# Patient Record
Sex: Female | Born: 1935 | Race: Black or African American | Hispanic: No | Marital: Single | Smoking: Never smoker
Health system: Southern US, Community
[De-identification: ages and names within clinical notes are randomized; demographics above are authoritative.]

## PROBLEM LIST (undated history)

## (undated) DIAGNOSIS — K259 Gastric ulcer, unspecified as acute or chronic, without hemorrhage or perforation: Secondary | ICD-10-CM

## (undated) DIAGNOSIS — M4800 Spinal stenosis, site unspecified: Secondary | ICD-10-CM

## (undated) DIAGNOSIS — I82409 Acute embolism and thrombosis of unspecified deep veins of unspecified lower extremity: Secondary | ICD-10-CM

## (undated) DIAGNOSIS — M1990 Unspecified osteoarthritis, unspecified site: Secondary | ICD-10-CM

## (undated) DIAGNOSIS — J302 Other seasonal allergic rhinitis: Secondary | ICD-10-CM

## (undated) DIAGNOSIS — N390 Urinary tract infection, site not specified: Secondary | ICD-10-CM

## (undated) DIAGNOSIS — R000 Tachycardia, unspecified: Secondary | ICD-10-CM

## (undated) DIAGNOSIS — N179 Acute kidney failure, unspecified: Secondary | ICD-10-CM

## (undated) DIAGNOSIS — K5900 Constipation, unspecified: Secondary | ICD-10-CM

## (undated) DIAGNOSIS — S42202A Unspecified fracture of upper end of left humerus, initial encounter for closed fracture: Secondary | ICD-10-CM

## (undated) DIAGNOSIS — E559 Vitamin D deficiency, unspecified: Secondary | ICD-10-CM

## (undated) DIAGNOSIS — I10 Essential (primary) hypertension: Secondary | ICD-10-CM

## (undated) DIAGNOSIS — I2699 Other pulmonary embolism without acute cor pulmonale: Secondary | ICD-10-CM

## (undated) DIAGNOSIS — IMO0002 Reserved for concepts with insufficient information to code with codable children: Secondary | ICD-10-CM

## (undated) HISTORY — DX: Gastric ulcer, unspecified as acute or chronic, without hemorrhage or perforation: K25.9

## (undated) HISTORY — DX: Acute embolism and thrombosis of unspecified deep veins of unspecified lower extremity: I82.409

## (undated) HISTORY — DX: Other pulmonary embolism without acute cor pulmonale: I26.99

## (undated) HISTORY — DX: Spinal stenosis, site unspecified: M48.00

## (undated) HISTORY — PX: DILATION AND CURETTAGE OF UTERUS: SHX78

## (undated) HISTORY — DX: Constipation, unspecified: K59.00

## (undated) HISTORY — DX: Other seasonal allergic rhinitis: J30.2

## (undated) HISTORY — PX: TONSILLECTOMY AND ADENOIDECTOMY: SUR1326

## (undated) HISTORY — DX: Reserved for concepts with insufficient information to code with codable children: IMO0002

## (undated) HISTORY — DX: Acute kidney failure, unspecified: N17.9

---

## 1998-09-03 ENCOUNTER — Encounter: Payer: Self-pay | Admitting: Emergency Medicine

## 1998-09-03 ENCOUNTER — Encounter: Payer: Self-pay | Admitting: Orthopaedic Surgery

## 1998-09-03 ENCOUNTER — Emergency Department (HOSPITAL_COMMUNITY): Admission: EM | Admit: 1998-09-03 | Discharge: 1998-09-03 | Payer: Self-pay | Admitting: Emergency Medicine

## 1998-12-02 ENCOUNTER — Encounter: Admission: RE | Admit: 1998-12-02 | Discharge: 1998-12-28 | Payer: Self-pay | Admitting: Orthopaedic Surgery

## 1999-01-01 ENCOUNTER — Encounter: Admission: RE | Admit: 1999-01-01 | Discharge: 1999-01-01 | Payer: Self-pay | Admitting: Orthopaedic Surgery

## 1999-01-21 ENCOUNTER — Encounter: Admission: RE | Admit: 1999-01-21 | Discharge: 1999-02-09 | Payer: Self-pay | Admitting: Orthopaedic Surgery

## 1999-12-08 ENCOUNTER — Encounter: Admission: RE | Admit: 1999-12-08 | Discharge: 1999-12-08 | Payer: Self-pay | Admitting: Hematology and Oncology

## 1999-12-10 ENCOUNTER — Encounter: Admission: RE | Admit: 1999-12-10 | Discharge: 1999-12-10 | Payer: Self-pay | Admitting: Internal Medicine

## 1999-12-13 ENCOUNTER — Encounter: Admission: RE | Admit: 1999-12-13 | Discharge: 1999-12-13 | Payer: Self-pay | Admitting: Internal Medicine

## 1999-12-15 ENCOUNTER — Encounter: Admission: RE | Admit: 1999-12-15 | Discharge: 1999-12-15 | Payer: Self-pay | Admitting: Internal Medicine

## 1999-12-29 ENCOUNTER — Encounter: Admission: RE | Admit: 1999-12-29 | Discharge: 1999-12-29 | Payer: Self-pay | Admitting: Internal Medicine

## 2000-09-12 ENCOUNTER — Encounter: Payer: Self-pay | Admitting: Family Medicine

## 2000-09-12 ENCOUNTER — Ambulatory Visit (HOSPITAL_COMMUNITY): Admission: RE | Admit: 2000-09-12 | Discharge: 2000-09-12 | Payer: Self-pay | Admitting: Family Medicine

## 2001-04-16 ENCOUNTER — Other Ambulatory Visit: Admission: RE | Admit: 2001-04-16 | Discharge: 2001-04-16 | Payer: Self-pay | Admitting: Family Medicine

## 2011-12-06 ENCOUNTER — Emergency Department (HOSPITAL_COMMUNITY): Payer: Medicare Other

## 2011-12-06 ENCOUNTER — Encounter (HOSPITAL_COMMUNITY): Payer: Self-pay | Admitting: *Deleted

## 2011-12-06 ENCOUNTER — Observation Stay (HOSPITAL_COMMUNITY)
Admission: EM | Admit: 2011-12-06 | Discharge: 2011-12-09 | Disposition: A | Discharge status: Home / Self Care | Payer: Medicare Other | Attending: Internal Medicine | Admitting: Internal Medicine

## 2011-12-06 DIAGNOSIS — R5383 Other fatigue: Secondary | ICD-10-CM | POA: Insufficient documentation

## 2011-12-06 DIAGNOSIS — N390 Urinary tract infection, site not specified: Secondary | ICD-10-CM | POA: Insufficient documentation

## 2011-12-06 DIAGNOSIS — M549 Dorsalgia, unspecified: Secondary | ICD-10-CM

## 2011-12-06 DIAGNOSIS — R079 Chest pain, unspecified: Principal | ICD-10-CM | POA: Diagnosis present

## 2011-12-06 DIAGNOSIS — M545 Low back pain, unspecified: Secondary | ICD-10-CM | POA: Insufficient documentation

## 2011-12-06 DIAGNOSIS — Z79899 Other long term (current) drug therapy: Secondary | ICD-10-CM | POA: Insufficient documentation

## 2011-12-06 DIAGNOSIS — R5381 Other malaise: Secondary | ICD-10-CM | POA: Insufficient documentation

## 2011-12-06 DIAGNOSIS — R0602 Shortness of breath: Secondary | ICD-10-CM | POA: Insufficient documentation

## 2011-12-06 DIAGNOSIS — R000 Tachycardia, unspecified: Secondary | ICD-10-CM | POA: Insufficient documentation

## 2011-12-06 DIAGNOSIS — I10 Essential (primary) hypertension: Secondary | ICD-10-CM | POA: Diagnosis present

## 2011-12-06 DIAGNOSIS — M129 Arthropathy, unspecified: Secondary | ICD-10-CM | POA: Insufficient documentation

## 2011-12-06 DIAGNOSIS — R531 Weakness: Secondary | ICD-10-CM

## 2011-12-06 DIAGNOSIS — I77819 Aortic ectasia, unspecified site: Secondary | ICD-10-CM | POA: Insufficient documentation

## 2011-12-06 HISTORY — DX: Unspecified osteoarthritis, unspecified site: M19.90

## 2011-12-06 HISTORY — DX: Essential (primary) hypertension: I10

## 2011-12-06 LAB — URINALYSIS, ROUTINE W REFLEX MICROSCOPIC
Bilirubin Urine: NEGATIVE
Ketones, ur: NEGATIVE mg/dL
Nitrite: NEGATIVE
Specific Gravity, Urine: 1.015 (ref 1.005–1.030)
Urobilinogen, UA: 0.2 mg/dL (ref 0.0–1.0)

## 2011-12-06 LAB — CBC WITH DIFFERENTIAL/PLATELET
Basophils Absolute: 0 10*3/uL (ref 0.0–0.1)
Basophils Relative: 0 % (ref 0–1)
Eosinophils Relative: 0 % (ref 0–5)
HCT: 42 % (ref 36.0–46.0)
MCHC: 34.3 g/dL (ref 30.0–36.0)
Monocytes Absolute: 0.3 10*3/uL (ref 0.1–1.0)
Neutro Abs: 8.4 10*3/uL — ABNORMAL HIGH (ref 1.7–7.7)
RDW: 14.1 % (ref 11.5–15.5)

## 2011-12-06 LAB — POCT I-STAT, CHEM 8
Creatinine, Ser: 1.1 mg/dL (ref 0.50–1.10)
Glucose, Bld: 114 mg/dL — ABNORMAL HIGH (ref 70–99)
Hemoglobin: 15.3 g/dL — ABNORMAL HIGH (ref 12.0–15.0)
TCO2: 25 mmol/L (ref 0–100)

## 2011-12-06 LAB — LIPID PANEL
LDL Cholesterol: 73 mg/dL (ref 0–99)
Total CHOL/HDL Ratio: 1.9 RATIO
VLDL: 7 mg/dL (ref 0–40)

## 2011-12-06 LAB — CARDIAC PANEL(CRET KIN+CKTOT+MB+TROPI)
CK, MB: 3.6 ng/mL (ref 0.3–4.0)
Relative Index: 0.9 (ref 0.0–2.5)

## 2011-12-06 LAB — MAGNESIUM: Magnesium: 2.1 mg/dL (ref 1.5–2.5)

## 2011-12-06 MED ORDER — HYDROCHLOROTHIAZIDE 25 MG PO TABS
25.0000 mg | ORAL_TABLET | Freq: Every day | ORAL | Status: DC
  Administered 2011-12-07 – 2011-12-09 (×3): 25 mg via ORAL
  Filled 2011-12-06 (×3): qty 1

## 2011-12-06 MED ORDER — FENTANYL CITRATE 0.05 MG/ML IJ SOLN
50.0000 ug | Freq: Once | INTRAMUSCULAR | Status: AC
  Administered 2011-12-06: 50 ug via INTRAVENOUS
  Filled 2011-12-06: qty 2

## 2011-12-06 MED ORDER — SODIUM CHLORIDE 0.9 % IJ SOLN
3.0000 mL | Freq: Two times a day (BID) | INTRAMUSCULAR | Status: DC
  Administered 2011-12-09: 3 mL via INTRAVENOUS

## 2011-12-06 MED ORDER — ONDANSETRON HCL 4 MG/2ML IJ SOLN: 4.0000 mg | Freq: Four times a day (QID) | INTRAMUSCULAR | Status: DC | PRN

## 2011-12-06 MED ORDER — AMLODIPINE BESYLATE 10 MG PO TABS
10.0000 mg | ORAL_TABLET | Freq: Every day | ORAL | Status: DC
  Administered 2011-12-07 – 2011-12-09 (×3): 10 mg via ORAL
  Filled 2011-12-06 (×3): qty 1

## 2011-12-06 MED ORDER — SODIUM CHLORIDE 0.9 % IV BOLUS (SEPSIS)
500.0000 mL | Freq: Once | INTRAVENOUS | Status: AC
  Administered 2011-12-06: 1000 mL via INTRAVENOUS

## 2011-12-06 MED ORDER — ONDANSETRON HCL 4 MG PO TABS: 4.0000 mg | ORAL_TABLET | Freq: Four times a day (QID) | ORAL | Status: DC | PRN

## 2011-12-06 MED ORDER — ENOXAPARIN SODIUM 30 MG/0.3ML ~~LOC~~ SOLN
30.0000 mg | SUBCUTANEOUS | Status: DC
  Administered 2011-12-06 – 2011-12-08 (×3): 30 mg via SUBCUTANEOUS
  Filled 2011-12-06 (×4): qty 0.3

## 2011-12-06 MED ORDER — SODIUM CHLORIDE 0.9 % IV SOLN: INTRAVENOUS | Status: AC

## 2011-12-06 MED ORDER — MORPHINE SULFATE 2 MG/ML IJ SOLN: 1.0000 mg | INTRAMUSCULAR | Status: DC | PRN

## 2011-12-06 MED ORDER — DICLOFENAC SODIUM 50 MG PO TBEC
50.0000 mg | DELAYED_RELEASE_TABLET | Freq: Two times a day (BID) | ORAL | Status: DC
  Administered 2011-12-06 – 2011-12-09 (×6): 50 mg via ORAL
  Filled 2011-12-06 (×7): qty 1

## 2011-12-06 MED ORDER — HYDROCODONE-ACETAMINOPHEN 5-325 MG PO TABS
1.0000 | ORAL_TABLET | ORAL | Status: DC | PRN
Start: 2011-12-06 — End: 2011-12-09
  Administered 2011-12-06: 1 via ORAL
  Administered 2011-12-07: 2 via ORAL
  Filled 2011-12-06 (×2): qty 2

## 2011-12-06 MED ORDER — LORATADINE 10 MG PO TABS
10.0000 mg | ORAL_TABLET | Freq: Every day | ORAL | Status: DC
  Administered 2011-12-07 – 2011-12-09 (×3): 10 mg via ORAL
  Filled 2011-12-06 (×3): qty 1

## 2011-12-06 MED ORDER — ASPIRIN EC 81 MG PO TBEC
81.0000 mg | DELAYED_RELEASE_TABLET | Freq: Every day | ORAL | Status: DC
  Administered 2011-12-06 – 2011-12-09 (×4): 81 mg via ORAL
  Filled 2011-12-06 (×6): qty 1

## 2011-12-06 NOTE — ED Provider Notes (Signed)
History     CSN: [id number]  Arrival date & time 12/06/11  1348   First MD Initiated Contact with Patient 12/06/11 1504      Chief Complaint  Patient presents with  . Back Pain  . Weakness    (Consider location/radiation/quality/duration/timing/severity/associated sxs/prior treatment) Patient is a 78 y.o. female presenting with back pain and weakness. The history is provided by the patient.  Back Pain  Associated symptoms include weakness. Pertinent negatives include no chest pain, no numbness, no headaches and no abdominal pain.  Weakness Primary symptoms do not include headaches, nausea or vomiting.  Additional symptoms include weakness. Additional symptoms do not include neck stiffness.   EMS was reportedly called because patient was not able to get out of the bathtub. Patient states she just takes longer now. She states her blood pressures been a little low and a new blood pressure medicine was started. She states it has been making her urinate more frequently. She denies chest pain abdominal pain back pain headache numbness or weakness. Patient states she feels fine.  Past Medical History  Diagnosis Date  . Hypertension   . Arthritis     Past Surgical History  Procedure Date  . No past surgeries     History reviewed. No pertinent family history.  History  Substance Use Topics  . Smoking status: Never Smoker   . Smokeless tobacco: Never Used  . Alcohol Use: No    OB History    Grav Para Term Preterm Abortions TAB SAB Ect Mult Living                  Review of Systems  Constitutional: Positive for fatigue. Negative for activity change and appetite change.  HENT: Negative for neck stiffness.   Eyes: Negative for pain.  Respiratory: Negative for chest tightness and shortness of breath.   Cardiovascular: Negative for chest pain and leg swelling.  Gastrointestinal: Negative for nausea, vomiting, abdominal pain and diarrhea.  Genitourinary: Positive for  frequency. Negative for flank pain.  Musculoskeletal: Positive for back pain.  Skin: Negative for rash.  Neurological: Positive for weakness. Negative for numbness and headaches.  Psychiatric/Behavioral: Negative for behavioral problems.    Allergies  Penicillins  Home Medications   No current outpatient prescriptions on file.  BP 145/86  Pulse 86  Temp 98.2 F (36.8 C) (Oral)  Resp 24  SpO2 97%  Physical Exam  Constitutional: She is oriented to person, place, and time. She appears well-developed.  HENT:  Head: Normocephalic.  Eyes: Pupils are equal, round, and reactive to light.  Neck: Normal range of motion. Neck supple.  Cardiovascular: Regular rhythm.        Mild tachycardia  Pulmonary/Chest: Effort normal and breath sounds normal.  Abdominal: Soft. There is no tenderness.  Musculoskeletal: Normal range of motion.  Neurological: She is alert and oriented to person, place, and time.  Skin: Skin is warm.    ED Course  Procedures (including critical care time)  Labs Reviewed  CBC WITH DIFFERENTIAL - Abnormal; Notable for the following:    Neutrophils Relative 92 (*)     Neutro Abs 8.4 (*)     Lymphocytes Relative 5 (*)     Lymphs Abs 0.5 (*)     All other components within normal limits  URINALYSIS, ROUTINE W REFLEX MICROSCOPIC - Abnormal; Notable for the following:    APPearance TURBID (*)     Hgb urine dipstick TRACE (*)     Leukocytes, UA LARGE (*)  All other components within normal limits  POCT I-STAT, CHEM 8 - Abnormal; Notable for the following:    Glucose, Bld 114 (*)     Hemoglobin 15.3 (*)     All other components within normal limits  CARDIAC PANEL(CRET KIN+CKTOT+MB+TROPI) - Abnormal; Notable for the following:    Total CK 415 (*)     All other components within normal limits  TROPONIN I  URINE MICROSCOPIC-ADD ON  MAGNESIUM  PHOSPHORUS  PRO B NATRIURETIC PEPTIDE  LIPID PANEL  URINE CULTURE  TSH  CARDIAC PANEL(CRET KIN+CKTOT+MB+TROPI)    HEMOGLOBIN A1C  BASIC METABOLIC PANEL  CBC   Dg Chest 2 View  12/06/2011  *RADIOLOGY REPORT*  Clinical Data: Weakness and fever  CHEST - 2 VIEW  Comparison: None.  Findings: Cardiac silhouette is mildly enlarged.  The aorta is ectatic.  Lungs are hyperinflated.  No effusion, infiltrate, or pneumothorax.  IMPRESSION:  1.  Emphysematous change. 2.  No acute cardiopulmonary findings.  Original Report Authenticated By: Genevive Bi, M.D.   Dg Lumbar Spine Complete  12/06/2011  *RADIOLOGY REPORT*  Clinical Data: Low back pain.  No known injury.  LUMBAR SPINE - COMPLETE 4+ VIEW  Comparison: None.  Findings: Advanced disc space narrowing L5-S1. Slight endplate concavity superior L4 without frank compression fracture.  Mild to moderate disc space narrowing L1-2 and L2-3.  Anatomic alignment. Advanced lower lumbar facet arthropathy.  Moderate stool burden. No osseous destructive lesion.  IMPRESSION: Degenerative changes as described.  No acute compression fracture or subluxation.  Original Report Authenticated By: Elsie Stain, M.D.     1. Back pain   2. Weakness   3. Tachycardia      Date: 12/06/2011  Rate: 93  Rhythm: normal sinus rhythm  QRS Axis: normal  Intervals: normal  ST/T Wave abnormalities: nonspecific ST/T changes  Conduction Disutrbances:none  Narrative Interpretation:   Old EKG Reviewed: none available    MDM  Patient with generalized weakness and back pain. Unable annually due to the pain. Negative x-ray. Also set some generalized weakness. Laboratories overall reassuring. She has some nonspecific EKG changes and left chest pain. She'll be admitted to medicine for further evaluation.        Juliet Rude. Rubin Payor, MD 12/06/11 [id number]

## 2011-12-06 NOTE — ED Notes (Signed)
Attempted to walk the patient. The patient reports that she is having too much pain to walk at this time. Family at the bedside

## 2011-12-06 NOTE — ED Notes (Signed)
Per ems: pt's son called because pt was not able to get out of the bath tub.pt c/o back pain and weakness for a couple of days. No loc noted. Pt is alert and oriented

## 2011-12-06 NOTE — H&P (Addendum)
[facility] History and Physical  TALANA SLATTEN ZOX:[id number] DOB: [birth year] DOA: 12/06/2011  Referring physician: ED physician PCP: No primary provider on file.   Chief Complaint: Chest paic  HPI:  78 yo female with HTN presents to [facility] ED with main concern of sudden onset, substernal, intermittent chest pain that started the morning of admission. Patient describes it as sharp, 5/10 in severity when presents, with no specific associated symptoms, with no specific aggravating or alleviating factors, no abdominal or urinary concerns. Pt denies fevers, chills, shortness of breath,no similar episodes in the past, no headaches or visual changes, no recent sicknesses or hospitalizations.   Review of Systems:   Constitutional: Negative for fever, chills and malaise/fatigue. Negative for diaphoresis.  HENT: Negative for hearing loss, ear pain, nosebleeds, congestion, sore throat, neck pain, tinnitus and ear discharge.   Eyes: Negative for blurred vision, double vision, photophobia, pain, discharge and redness.  Respiratory: Negative for cough, hemoptysis, sputum production, shortness of breath, wheezing and stridor.   Cardiovascular: Positive for chest pain, negative for palpitations, orthopnea, claudication and leg swelling.  Gastrointestinal: Negative for nausea, vomiting and abdominal pain. Negative for heartburn, constipation, blood in stool and melena.  Genitourinary: Negative for dysuria, urgency, frequency, hematuria and flank pain.  Musculoskeletal: Negative for myalgias, back pain, joint pain and falls.  Skin: Negative for itching and rash.  Neurological: Negative for dizziness and weakness. Negative for tingling, tremors, sensory change, speech change, focal weakness, loss of consciousness and headaches.  Endo/Heme/Allergies: Negative for environmental allergies and polydipsia. Does not bruise/bleed easily.  Psychiatric/Behavioral: Negative for suicidal ideas. The patient is not  nervous/anxious.      Past Medical History  Diagnosis Date  . Hypertension    History reviewed. No pertinent past surgical history. Social History:  reports that she has never smoked. She does not have any smokeless tobacco history on file. She reports that she does not drink alcohol or use illicit drugs.  Allergies  Allergen Reactions  . Penicillins Rash    No family history on file.  Prior to Admission medications   Medication Sig Start Date End Date Taking? Authorizing Provider  amLODipine (NORVASC) 10 MG tablet Take 10 mg by mouth daily.   Yes Historical Provider, MD  diclofenac (VOLTAREN) 50 MG EC tablet Take 50 mg by mouth 2 (two) times daily.   Yes Historical Provider, MD  hydrochlorothiazide (HYDRODIURIL) 25 MG tablet Take 25 mg by mouth daily.   Yes Historical Provider, MD  loratadine (CLARITIN) 10 MG tablet Take 10 mg by mouth daily.   Yes Historical Provider, MD   Physical Exam: Filed Vitals:   12/06/11 1353 12/06/11 1522 12/06/11 1934  BP: 136/88 137/91   Pulse: 114 97 100  Temp: 98.2 F (36.8 C)    TempSrc: Oral    Resp: 16  24  SpO2: 96% 98% 97%    Physical Exam  Constitutional: Appears well-developed and well-nourished. No distress.  HENT: Normocephalic. External right and left ear normal. Oropharynx is clear and moist.  Eyes: Conjunctivae and EOM are normal. PERRLA, no scleral icterus.  Neck: Normal ROM. Neck supple. No JVD. No tracheal deviation. No thyromegaly.  CVS: RRR, S1/S2 +, no murmurs, no gallops, no carotid bruit.  Pulmonary: Effort and breath sounds normal, no stridor, rhonchi, wheezes, rales.  Abdominal: Soft. BS +,  no distension, tenderness, rebound or guarding.  Musculoskeletal: Normal range of motion. No edema and no tenderness.  Lymphadenopathy: No lymphadenopathy noted, cervical, inguinal. Neuro: Alert. Normal reflexes, muscle  tone coordination. No cranial nerve deficit. Skin: Skin is warm and dry. No rash noted. Not diaphoretic. No  erythema. No pallor.  Psychiatric: Normal mood and affect. Behavior, judgment, thought content normal.   Labs on Admission:  Basic Metabolic Panel:  Lab 12/06/11 0102  NA 141  K 4.1  CL 103  CO2 --  GLUCOSE 114*  BUN 23  CREATININE 1.10  CALCIUM --  MG --  PHOS --   CBC:  Lab 12/06/11 1538 12/06/11 1517  WBC -- 9.2  NEUTROABS -- 8.4*  HGB 15.3* 14.4  HCT 45.0 42.0  MCV -- 85.4  PLT -- 238   Cardiac Enzymes:  Lab 12/06/11 1517  CKTOTAL --  CKMB --  CKMBINDEX --  TROPONINI <0.30    Radiological Exams on Admission:  Dg Chest 2 View 12/06/2011  IMPRESSION:   1.  Emphysematous change.  2.  No acute cardiopulmonary findings.   Dg Lumbar Spine Complete 12/06/2011   IMPRESSION:  Degenerative changes as described.  No acute compression fracture or subluxation.    EKG: pending  Assessment/Plan  Chest pain with intermittent shortness of breath - will admit the pt to telemetry floor for further evaluation and management - will cycle CE's and will obtain 12 lead EKG, check BNP - we have no previous EKG to compare with  - will check TSH and electrolyte panel, lipid panel - BP control with continuation of home medication regimen - CBC and BMP in AM - start aspirin 81 mg PO QD, analgesia for adequate pain control   HTN - continue home medication regimen - monitor vitals per floor protocol  Code Status: Full Family Communication: Pt and family at bedside Disposition Plan: Admit to telemetry floor  Debbora Presto, MD  [facility] Pager [phone]  If 7PM-7AM, please contact night-coverage [email or web address] Password [email or web address] 12/06/2011, 9:10 PM

## 2011-12-06 NOTE — ED Notes (Signed)
WUJ:[id number]<JY> Expected date:12/06/11<BR> Expected time: 1:32 PM<BR> Means of arrival:Ambulance<BR> Comments:<BR> 82yoF,&quot;inability to get out of the tub&quot;.

## 2011-12-07 DIAGNOSIS — R5381 Other malaise: Secondary | ICD-10-CM

## 2011-12-07 DIAGNOSIS — N390 Urinary tract infection, site not specified: Secondary | ICD-10-CM | POA: Diagnosis present

## 2011-12-07 DIAGNOSIS — I10 Essential (primary) hypertension: Secondary | ICD-10-CM

## 2011-12-07 DIAGNOSIS — R079 Chest pain, unspecified: Secondary | ICD-10-CM

## 2011-12-07 LAB — CBC
HCT: 39.6 % (ref 36.0–46.0)
Hemoglobin: 13.5 g/dL (ref 12.0–15.0)
MCH: 29.6 pg (ref 26.0–34.0)
MCHC: 34.1 g/dL (ref 30.0–36.0)
MCV: 86.8 fL (ref 78.0–100.0)
RBC: 4.56 MIL/uL (ref 3.87–5.11)

## 2011-12-07 LAB — BASIC METABOLIC PANEL
BUN: 19 mg/dL (ref 6–23)
CO2: 24 mEq/L (ref 19–32)
Chloride: 106 mEq/L (ref 96–112)
GFR calc non Af Amer: 56 mL/min — ABNORMAL LOW (ref >90)
Glucose, Bld: 86 mg/dL (ref 70–99)
Potassium: 3.8 mEq/L (ref 3.5–5.1)
Sodium: 141 mEq/L (ref 135–145)

## 2011-12-07 LAB — CARDIAC PANEL(CRET KIN+CKTOT+MB+TROPI)
CK, MB: 3.3 ng/mL (ref 0.3–4.0)
CK, MB: 3.3 ng/mL (ref 0.3–4.0)
Total CK: 413 U/L — ABNORMAL HIGH (ref 7–177)
Total CK: 489 U/L — ABNORMAL HIGH (ref 7–177)
Troponin I: <0.3 ng/mL (ref <0.30)

## 2011-12-07 LAB — URINE CULTURE: Colony Count: NO GROWTH

## 2011-12-07 LAB — HEMOGLOBIN A1C: Hgb A1c MFr Bld: 6 % — ABNORMAL HIGH (ref <5.7)

## 2011-12-07 LAB — D-DIMER, QUANTITATIVE: D-Dimer, Quant: 2.39 ug/mL-FEU — ABNORMAL HIGH (ref 0.00–0.48)

## 2011-12-07 MED ORDER — CIPROFLOXACIN IN D5W 400 MG/200ML IV SOLN
400.0000 mg | Freq: Two times a day (BID) | INTRAVENOUS | Status: DC
  Administered 2011-12-07 – 2011-12-08 (×2): 400 mg via INTRAVENOUS
  Filled 2011-12-07 (×3): qty 200

## 2011-12-07 NOTE — Progress Notes (Signed)
   CARE MANAGEMENT NOTE 12/07/2011  Patient:  Becky Leach, Becky Leach   Account Number:  [id number]  Date Initiated:  12/07/2011  Documentation initiated by:  Jiles Crocker  Subjective/Objective Assessment:   ADMITTED WITH BACK AND CHEST PAIN     Action/Plan:   LIVES AT HOME WITH FAMILY; CARDIAC WORKUP IN PROGRESS   Anticipated DC Date:  12/09/2011   Anticipated DC Plan:  HOME W HOME HEALTH SERVICES      DC Planning Services  CM consult            Status of service:  In process, will continue to follow Medicare Important Message given?  NA - LOS <3 / Initial given by admissions (If response is "NO", the following Medicare IM given date fields will be blank)  Per UR Regulation:  Reviewed for med. necessity/level of care/duration of stay  Comments:  12/07/2011- B Fishel Wamble RN, BSN, MHA

## 2011-12-07 NOTE — Evaluation (Signed)
Physical Therapy Evaluation Patient Details Name: Becky Leach MRN: [id number] DOB: July 22, 1929 Today's Date: 12/07/2011 Time: 4098-1191 PT Time Calculation (min): 29 min  PT Assessment / Plan / Recommendation Clinical Impression  pt will benefit from PT to maximize independence for home; pt has son's assist 24/7; will likely benefit from HHPT and may have better gait stability with Rw---pt did not seem open to walker today as she walks wtih [facility] at baseline    PT Assessment  Patient needs continued PT services    Follow Up Recommendations  Home health PT    Barriers to Discharge        Equipment Recommendations  Rolling walker with 5" wheels (may need)    Recommendations for Other Services     Frequency Min 3X/week    Precautions / Restrictions Precautions Precautions: Fall   Pertinent Vitals/Pain       Mobility  Transfers Transfers: Sit to Stand;Stand to Sit Sit to Stand: 4: Min assist;From chair/3-in-1 Stand to Sit: 4: Min assist;To chair/3-in-1 Details for Transfer Assistance: cues for hand placement and assist for balance upon inital stand Ambulation/Gait Ambulation/Gait Assistance: 4: Min assist Ambulation Distance (Feet): 150 Feet Assistive device: 1 person hand held assist Ambulation/Gait Assistance Details: min assist for balance, cues for step length;   Gait Pattern: Step-through pattern Gait velocity: slow    Exercises     PT Diagnosis: Difficulty walking  PT Problem List: Decreased activity tolerance;Decreased balance;Decreased mobility;Decreased knowledge of use of DME PT Treatment Interventions: DME instruction;Gait training;Stair training;Functional mobility training;Therapeutic activities;Therapeutic exercise;Balance training;Patient/family education   PT Goals Acute Rehab PT Goals PT Goal Formulation: With patient Time For Goal Achievement: 12/21/11 Potential to Achieve Goals: Good Pt will go Supine/Side to Sit: with supervision PT Goal:  Supine/Side to Sit - Progress: Goal set today Pt will go Sit to Stand: with supervision PT Goal: Sit to Stand - Progress: Goal set today Pt will Ambulate: >150 feet;with supervision;with least restrictive assistive device PT Goal: Ambulate - Progress: Goal set today Pt will Go Up / Down Stairs: 3-5 stairs;with min assist;with rail(s) PT Goal: Up/Down Stairs - Progress: Goal set today  Visit Information  Last PT Received On: 12/07/11    Subjective Data  Subjective: my son is going to bring my tennis shoes Patient Stated Goal: home   Prior Functioning  Home Living Lives With: Son Available Help at Discharge: Family Type of Home: House Home Access: Stairs to enter [employer] of Steps: 6 "coming from street" and then 3 steps with 2 rails Entrance Stairs-Rails: Right;Left;Can reach both Home Layout: One level Home Adaptive Equipment: Straight cane;Bedside commode/3-in-1 Additional Comments: laundry in basement; son can assist; doesn't use 3in1 Prior Function Level of Independence: Independent with assistive device(s) Able to Take Stairs?: Yes Communication Communication: No difficulties    Cognition  Overall Cognitive Status: Appears within functional limits for tasks assessed/performed Arousal/Alertness: Awake/alert Orientation Level: Appears intact for tasks assessed Behavior During Session: [facility] for tasks performed    Extremity/Trunk Assessment Right Upper Extremity Assessment RUE ROM/Strength/Tone: [facility] for tasks assessed Left Upper Extremity Assessment LUE ROM/Strength/Tone: [facility] for tasks assessed Right Lower Extremity Assessment RLE ROM/Strength/Tone: [facility] for tasks assessed Left Lower Extremity Assessment LLE ROM/Strength/Tone: [facility] for tasks assessed   Balance Static Standing Balance Static Standing - Balance Support: Right upper extremity supported Static Standing - Level of Assistance: 4: Min assist;5: Stand by assistance  End of Session PT - End of  Session Equipment Utilized During Treatment: Gait belt Activity Tolerance:  Patient tolerated treatment well Patient left: in chair;with chair alarm set;with call bell/phone within reach  GP     [facility] 12/07/2011, 12:54 PM

## 2011-12-07 NOTE — Progress Notes (Signed)
[facility] PROGRESS NOTE  Becky Leach ZOX:[id number] DOB: May 30, 1930 DOA: 12/06/2011 PCP: No primary provider on file.  Assessment/Plan: Principal Problem:  *Chest pain Active Problems:  HTN (hypertension)   Chest pain -Admitted to rule out acute coronary syndrome, so far cardiac enzymes are negative. -Denies any chest pain now, denies any history of heartburn. -I will rule out PE by checking D. dimers if it's positive will obtain CT angio of the chest.  UTI -Urinalysis is consistent with UTI. -I will start patient on ciprofloxacin, we'll adjust antibiotics according to the urine culture.  Hypertension -Continue home medication regimen. -Monitor vitals per flow protocol.   Code Status: Full Family Communication: Remains inpatient Disposition Plan: Home with home health service when she is improving  Brief narrative: 78 year old African American female, came in to the hospital with sharp substernal intermittent chest pain  Consultants:  None  Procedures:  None  Antibiotics:  None  HPI/Subjective: Denies any chest pain, denies shortness of breath. Son at bedside.  Objective: Filed Vitals:   12/06/11 1934 12/06/11 2213 12/06/11 2315 12/07/11 0456  BP:  130/80 145/86 133/83  Pulse: 100 93 86 81  Temp:   98.2 F (36.8 C) 98.3 F (36.8 C)  TempSrc:   Oral Oral  Resp: 24 24 24 20   Height:   5\' 5"  (1.651 m)   Weight:   43.182 kg (95 lb 3.2 oz)   SpO2: 97% 98% 97% 96%    Intake/Output Summary (Last 24 hours) at 12/07/11 1308 Last data filed at 12/07/11 0711  Gross per 24 hour  Intake    120 ml  Output    850 ml  Net   -730 ml    Exam:  General: Alert and awake, oriented x3, not in any acute distress. HEENT: anicteric sclera, pupils reactive to light and accommodation, EOMI CVS: S1-S2 clear, no murmur rubs or gallops Chest: clear to auscultation bilaterally, no wheezing, rales or rhonchi Abdomen: soft nontender, nondistended, normal bowel  sounds, no organomegaly Extremities: no cyanosis, clubbing or edema noted bilaterally Neuro: Cranial nerves II-XII intact, no focal neurological deficits  Data Reviewed: Basic Metabolic Panel:  Lab 12/07/11 8119 12/06/11 2127 12/06/11 1538  NA 141 -- 141  K 3.8 -- 4.1  CL 106 -- 103  CO2 24 -- --  GLUCOSE 86 -- 114*  BUN 19 -- 23  CREATININE 0.94 -- 1.10  CALCIUM 9.8 -- --  MG -- 2.1 --  PHOS -- 3.0 --   Liver Function Tests: No results found for this basename: AST:5,ALT:5,ALKPHOS:5,BILITOT:5,PROT:5,ALBUMIN:5 in the last 168 hours No results found for this basename: LIPASE:5,AMYLASE:5 in the last 168 hours No results found for this basename: AMMONIA:5 in the last 168 hours CBC:  Lab 12/07/11 0505 12/06/11 1538 12/06/11 1517  WBC 6.0 -- 9.2  NEUTROABS -- -- 8.4*  HGB 13.5 15.3* 14.4  HCT 39.6 45.0 42.0  MCV 86.8 -- 85.4  PLT 193 -- 238   Cardiac Enzymes:  Lab 12/07/11 0505 12/06/11 2127 12/06/11 1517  CKTOTAL 413* 415* --  CKMB 3.3 3.6 --  CKMBINDEX -- -- --  TROPONINI <0.30 <0.30 <0.30   BNP (last 3 results)  Basename 12/06/11 2127  PROBNP 127.4   CBG:  Lab 12/07/11 0732  GLUCAP 88    No results found for this or any previous visit (from the past 240 hour(s)).   Studies: Dg Chest 2 View  12/06/2011  *RADIOLOGY REPORT*  Clinical Data: Weakness and fever  CHEST -  2 VIEW  Comparison: None.  Findings: Cardiac silhouette is mildly enlarged.  The aorta is ectatic.  Lungs are hyperinflated.  No effusion, infiltrate, or pneumothorax.  IMPRESSION:  1.  Emphysematous change. 2.  No acute cardiopulmonary findings.  Original Report Authenticated By: Genevive Bi, M.D.   Dg Lumbar Spine Complete  12/06/2011  *RADIOLOGY REPORT*  Clinical Data: Low back pain.  No known injury.  LUMBAR SPINE - COMPLETE 4+ VIEW  Comparison: None.  Findings: Advanced disc space narrowing L5-S1. Slight endplate concavity superior L4 without frank compression fracture.  Mild to moderate disc  space narrowing L1-2 and L2-3.  Anatomic alignment. Advanced lower lumbar facet arthropathy.  Moderate stool burden. No osseous destructive lesion.  IMPRESSION: Degenerative changes as described.  No acute compression fracture or subluxation.  Original Report Authenticated By: Elsie Stain, M.D.    Scheduled Meds:   . amLODipine  10 mg Oral Daily  . aspirin EC  81 mg Oral Daily  . diclofenac  50 mg Oral BID  . enoxaparin (LOVENOX) injection  30 mg Subcutaneous Q24H  . fentaNYL  50 mcg Intravenous Once  . hydrochlorothiazide  25 mg Oral Daily  . loratadine  10 mg Oral Daily  . sodium chloride  500 mL Intravenous Once  . sodium chloride  3 mL Intravenous Q12H   Continuous Infusions:   . sodium chloride       Walter Min A [facility] Pager [phone]  If 7PM-7AM, please contact night-coverage [email or web address] Password [email or web address] 12/07/2011, 1:08 PM   LOS: 1 day

## 2011-12-07 NOTE — Progress Notes (Addendum)
12-07-11  0045 NSG:  Pt admitted to floor at 2300 and pt reported her pain was a 10/10 in her low back and sometimes buttuck (it has not been in her chest per son and pt only in her back) with any kind of movement.  Pt given po Vicodin and and hour and a half later we got her up to bsc and pt reported she no longer had any  Pain with movement.  Son's reported that pt was taking bystolic 10 mg qd started a week ago with all of the other meds started at that time and we do not have it noted in her PTA meds.

## 2011-12-08 ENCOUNTER — Observation Stay (HOSPITAL_COMMUNITY): Payer: Medicare Other

## 2011-12-08 DIAGNOSIS — R000 Tachycardia, unspecified: Secondary | ICD-10-CM | POA: Diagnosis present

## 2011-12-08 LAB — BASIC METABOLIC PANEL
CO2: 26 mEq/L (ref 19–32)
Calcium: 9.7 mg/dL (ref 8.4–10.5)
Chloride: 105 mEq/L (ref 96–112)
Creatinine, Ser: 0.85 mg/dL (ref 0.50–1.10)
Glucose, Bld: 99 mg/dL (ref 70–99)

## 2011-12-08 MED ORDER — MENTHOL 3 MG MT LOZG
1.0000 | LOZENGE | OROMUCOSAL | Status: DC | PRN
  Administered 2011-12-08: 3 mg via ORAL
  Filled 2011-12-08: qty 9

## 2011-12-08 MED ORDER — BOOST / RESOURCE BREEZE PO LIQD
1.0000 | Freq: Every day | ORAL | Status: DC
  Administered 2011-12-08: 1 via ORAL

## 2011-12-08 MED ORDER — IOHEXOL 300 MG/ML  SOLN
50.0000 mL | Freq: Once | INTRAMUSCULAR | Status: AC | PRN
  Administered 2011-12-08: 50 mL via INTRAVENOUS

## 2011-12-08 MED ORDER — ENSURE COMPLETE PO LIQD
237.0000 mL | Freq: Every day | ORAL | Status: DC
  Administered 2011-12-08: 237 mL via ORAL

## 2011-12-08 MED ORDER — LEVOFLOXACIN 500 MG PO TABS
500.0000 mg | ORAL_TABLET | Freq: Every day | ORAL | Status: DC
  Administered 2011-12-08 – 2011-12-09 (×2): 500 mg via ORAL
  Filled 2011-12-08 (×2): qty 1

## 2011-12-08 NOTE — Progress Notes (Signed)
[facility] PROGRESS NOTE  Becky Leach ZOX:[id number] DOB: 12-31-1929 DOA: 12/06/2011 PCP: No primary provider on file.  Assessment/Plan: Principal Problem:  *Chest pain Active Problems:  HTN (hypertension)  UTI (lower urinary tract infection)  Tachycardia   Chest pain -Admitted to rule out acute coronary syndrome, so far cardiac enzymes are negative. -Denies any chest pain now, denies any history of heartburn. -I will rule out PE by checking D. dimers if it's positive will obtain CT angio of the chest.  UTI -Although his urinalysis consistent with UTI with marked pyuria, urine cultures comes back negative. -Patient is on IV ciprofloxacin, I will switch to oral Levaquin.  Hypertension -Continue home medication regimen. -Monitor vitals per flow protocol.  Tachycardia -Heart rate went up to 150, intermittently with regular sinus rhythm at 90. -12-lead EKG showed normal sinus rhythm.  Code Status: Full Family Communication: Remains inpatient Disposition Plan: Home with home health service likely in the morning.  Brief narrative: 78 year old African American female, came in to the hospital with sharp substernal intermittent chest pain  Consultants:  None  Procedures:  None  Antibiotics:  None  HPI/Subjective: Denies any chest pain, has some irritation in her throat  Objective: Filed Vitals:   12/07/11 2200 12/08/11 0453 12/08/11 0549 12/08/11 0920  BP: 132/86  130/85 116/76  Pulse: 86  86 111  Temp: 98.6 F (37 C)  98.5 F (36.9 C)   TempSrc: Oral  Oral   Resp: 18  18   Height:      Weight:  42.003 kg (92 lb 9.6 oz)    SpO2: 96%  97%     Intake/Output Summary (Last 24 hours) at 12/08/11 1222 Last data filed at 12/08/11 1108  Gross per 24 hour  Intake   1040 ml  Output   2300 ml  Net  -1260 ml    Exam:  General: Alert and awake, oriented x3, not in any acute distress. HEENT: anicteric sclera, pupils reactive to light and accommodation,  EOMI CVS: S1-S2 clear, no murmur rubs or gallops Chest: clear to auscultation bilaterally, no wheezing, rales or rhonchi Abdomen: soft nontender, nondistended, normal bowel sounds, no organomegaly Extremities: no cyanosis, clubbing or edema noted bilaterally Neuro: Cranial nerves II-XII intact, no focal neurological deficits  Data Reviewed: Basic Metabolic Panel:  Lab 12/08/11 8119 12/07/11 0505 12/06/11 2127 12/06/11 1538  NA 138 141 -- 141  K 3.9 3.8 -- 4.1  CL 105 106 -- 103  CO2 26 24 -- --  GLUCOSE 99 86 -- 114*  BUN 16 19 -- 23  CREATININE 0.85 0.94 -- 1.10  CALCIUM 9.7 9.8 -- --  MG -- -- 2.1 --  PHOS -- -- 3.0 --   Liver Function Tests: No results found for this basename: AST:5,ALT:5,ALKPHOS:5,BILITOT:5,PROT:5,ALBUMIN:5 in the last 168 hours No results found for this basename: LIPASE:5,AMYLASE:5 in the last 168 hours No results found for this basename: AMMONIA:5 in the last 168 hours CBC:  Lab 12/07/11 0505 12/06/11 1538 12/06/11 1517  WBC 6.0 -- 9.2  NEUTROABS -- -- 8.4*  HGB 13.5 15.3* 14.4  HCT 39.6 45.0 42.0  MCV 86.8 -- 85.4  PLT 193 -- 238   Cardiac Enzymes:  Lab 12/07/11 1324 12/07/11 0505 12/06/11 2127 12/06/11 1517  CKTOTAL 489* 413* 415* --  CKMB 3.3 3.3 3.6 --  CKMBINDEX -- -- -- --  TROPONINI <0.30 <0.30 <0.30 <0.30   BNP (last 3 results)  Basename 12/06/11 2127  PROBNP 127.4   CBG:  Lab 12/08/11 0742 12/07/11 0732  GLUCAP 92 88    Recent Results (from the past 240 hour(s))  URINE CULTURE     Status: Normal   Collection Time   12/06/11  7:08 PM      Component Value Range Status Comment   Specimen Description URINE, CATHETERIZED   Final    Special Requests NONE   Final    Culture  Setup Time 12/06/2011 23:36   Final    Colony Count NO GROWTH   Final    Culture NO GROWTH   Final    Report Status 12/07/2011 FINAL   Final      Studies: Dg Chest 2 View  12/06/2011  *RADIOLOGY REPORT*  Clinical Data: Weakness and fever  CHEST - 2 VIEW   Comparison: None.  Findings: Cardiac silhouette is mildly enlarged.  The aorta is ectatic.  Lungs are hyperinflated.  No effusion, infiltrate, or pneumothorax.  IMPRESSION:  1.  Emphysematous change. 2.  No acute cardiopulmonary findings.  Original Report Authenticated By: Genevive Bi, M.D.   Dg Lumbar Spine Complete  12/06/2011  *RADIOLOGY REPORT*  Clinical Data: Low back pain.  No known injury.  LUMBAR SPINE - COMPLETE 4+ VIEW  Comparison: None.  Findings: Advanced disc space narrowing L5-S1. Slight endplate concavity superior L4 without frank compression fracture.  Mild to moderate disc space narrowing L1-2 and L2-3.  Anatomic alignment. Advanced lower lumbar facet arthropathy.  Moderate stool burden. No osseous destructive lesion.  IMPRESSION: Degenerative changes as described.  No acute compression fracture or subluxation.  Original Report Authenticated By: Elsie Stain, M.D.   Ct Angio Chest W/cm &/or Wo Cm  12/08/2011  *RADIOLOGY REPORT*  Clinical Data: Elevated D-dimer, substernal chest pain  CT ANGIOGRAPHY CHEST  Technique:  Multidetector CT imaging of the chest using the standard protocol during bolus administration of intravenous contrast. Multiplanar reconstructed images including MIPs were obtained and reviewed to evaluate the vascular anatomy.  Contrast: 50mL OMNIPAQUE IOHEXOL 300 MG/ML  SOLN  Comparison: None  Findings: No filling defects in the pulmonary to suggest acute pulmonary embolism. There is a poor opacification of the lower lobe pulmonary arteries due to timing of the contrast bolus.  There is mild atelectasis at the lung bases.  No consolidation or pneumothorax.  Airways are normal.  Limited view of the upper abdomen is unremarkable.  Limited view of the skeleton is unremarkable.  No acute findings of the aorta great vessels.  No pericardial fluid.  IMPRESSION:  1.  No evidence of acute pulmonary embolism.  Evaluation of the lower lobe segmental arteries is somewhat limited due  to bolus timing. 2. Bibasilar atelectasis.  Original Report Authenticated By: Genevive Bi, M.D.    Scheduled Meds:    . amLODipine  10 mg Oral Daily  . aspirin EC  81 mg Oral Daily  . ciprofloxacin  400 mg Intravenous Q12H  . diclofenac  50 mg Oral BID  . enoxaparin (LOVENOX) injection  30 mg Subcutaneous Q24H  . hydrochlorothiazide  25 mg Oral Daily  . loratadine  10 mg Oral Daily  . sodium chloride  3 mL Intravenous Q12H   Continuous Infusions:    [facility] A [facility] Pager [phone]  If 7PM-7AM, please contact night-coverage [email or web address] Password [email or web address] 12/08/2011, 12:22 PM   LOS: 2 days

## 2011-12-08 NOTE — Progress Notes (Signed)
INITIAL ADULT NUTRITION ASSESSMENT Date: 12/08/2011   Time: 10:53 AM Reason for Assessment: Consult for adult patient appears malnourished  ASSESSMENT: Female 78 y.o.  Dx: Chest pain  Hx:  Past Medical History  Diagnosis Date  . Hypertension   . Arthritis     Related Meds:  Scheduled Meds:   . amLODipine  10 mg Oral Daily  . aspirin EC  81 mg Oral Daily  . ciprofloxacin  400 mg Intravenous Q12H  . diclofenac  50 mg Oral BID  . enoxaparin (LOVENOX) injection  30 mg Subcutaneous Q24H  . hydrochlorothiazide  25 mg Oral Daily  . loratadine  10 mg Oral Daily  . sodium chloride  3 mL Intravenous Q12H   Continuous Infusions:  PRN Meds:.HYDROcodone-acetaminophen, iohexol, morphine injection, ondansetron (ZOFRAN) IV, ondansetron   Ht: 5\' 5"  (165.1 cm)  Wt: 92 lb 9.6 oz (42.003 kg)  Ideal Wt: 56.18 kg % Ideal Wt: 73.6% Wt Readings from Last 10 Encounters:  12/08/11 92 lb 9.6 oz (42.003 kg)   BMI: 15.3 kg/m^2 (Underweight)  Food/Nutrition Related Hx: Patient appears thin. Patient reported her appetite is good. She reported she has difficulty chewing and swallowing due to throat pain. She reported she usually eats softer foods. Per patient's family patient has lost about 15 lb over the past few months. Patient agreed to try nutrition supplements to increase caloric intake.   Labs:  CMP     Component Value Date/Time   NA 138 12/08/2011 0420   K 3.9 12/08/2011 0420   CL 105 12/08/2011 0420   CO2 26 12/08/2011 0420   GLUCOSE 99 12/08/2011 0420   BUN 16 12/08/2011 0420   CREATININE 0.85 12/08/2011 0420   CALCIUM 9.7 12/08/2011 0420   GFRNONAA 63* 12/08/2011 0420   GFRAA 73* 12/08/2011 0420     Intake/Output Summary (Last 24 hours) at 12/08/11 1054 Last data filed at 12/08/11 0455  Gross per 24 hour  Intake   1040 ml  Output   2000 ml  Net   -960 ml    Diet Order: General  Supplements/Tube Feeding: none at this time  IVF:    Estimated Nutritional Needs:   Kcal:  1200-1500 Protein: 68-85 grams Fluid: 1 ml per kcal intake  NUTRITION DIAGNOSIS: -Increased nutrient needs (NI-5.1).  Status: Ongoing  RELATED TO: pt meets underweight criteria   AS EVIDENCE BY: BMI of 15.3 kg/m^2  MONITORING/EVALUATION(Goals): PO intake, weights, labs 1. PO intake > 75% at meals and supplements.  2. Minimize weight loss.   EDUCATION NEEDS: -No education needs identified at this time  INTERVENTION: 1. Will order patient 1 Ensure nutrition supplement daily. Provides 250 kcal and 9 grams of protein daily.  2. Will order patient 1 Resource Breeze nutrition supplement daily. Provides 250 kcal and 9 grams of protein daily.  3. Will order patient snack once daily to increase PO intake.  4. RD available for nutrition needs.    Dietitian [phone]  DOCUMENTATION CODES Per approved criteria  -Underweight    Iven Finn [facility] 12/08/2011, 10:53 AM

## 2011-12-08 NOTE — Progress Notes (Addendum)
8119 - Call received from monitor tech that pt heart rate in 190s.  Pt assessed at bedside, manual heart rate in 110s at this time.  Denies any symptoms, currently eating breakfast sitting in bed.  States she has been coughing a little clear phlegm up this morning but no other complaints.  BP 116/76.  Will continue to monitor.  Ardyth Gal, RN 12/08/2011  [phone] - More calls received from monitor tech.  Pt having small runs of increased heart rate up to 140s-150s, intermittent with regular sinus rhythm in 90s.  Pt assessed at bedside, still asymptomatic and denies any complaints at this time.  As RN was in process of text-paging MD, Dr. Arthor Captain arrived at bedside and notified by RN.  RN will continue to monitor.  Ardyth Gal, RN 12/08/2011

## 2011-12-08 NOTE — Progress Notes (Signed)
Received report on this pt and assumed care.  Reviewed lab results.  D Dimer elevated yesterday at 2.39.  Notified MD, new orders received.  Pt resting comfortably in bed with no evidence of dyspnea or chest pain.  Will continue to monitor.  Ardyth Gal, RN 12/08/2011

## 2011-12-09 LAB — BASIC METABOLIC PANEL
BUN: 17 mg/dL (ref 6–23)
Calcium: 9.4 mg/dL (ref 8.4–10.5)
Creatinine, Ser: 1.17 mg/dL — ABNORMAL HIGH (ref 0.50–1.10)
GFR calc Af Amer: 50 mL/min — ABNORMAL LOW (ref >90)
GFR calc non Af Amer: 43 mL/min — ABNORMAL LOW (ref >90)
Glucose, Bld: 97 mg/dL (ref 70–99)

## 2011-12-09 NOTE — Discharge Summary (Addendum)
Physician Discharge Summary  Becky Leach XBJ:[id number] DOB: 1929/10/08 DOA: 12/06/2011  PCP: No primary provider on file.  Admit date: 12/06/2011 Discharge date: 12/09/2011  Recommendations for Outpatient Follow-up:  1. Check BMP 2. Home health service to provide physical therapy  Discharge Diagnoses:  Principal Problem:  *Chest pain Active Problems:  HTN (hypertension)  UTI (lower urinary tract infection)  Tachycardia   1. Chest pain, noncardiac  Discharge Condition: Stable  Diet recommendation: Heart healthy  History of present illness:  78 yo female with HTN presents to [facility] ED with main concern of sudden onset, substernal, intermittent chest pain that started the morning of admission. Patient describes it as sharp, 5/10 in severity when presents, with no specific associated symptoms, with no specific aggravating or alleviating factors, no abdominal or urinary concerns. Pt denies fevers, chills, shortness of breath,no similar episodes in the past, no headaches or visual changes, no recent sicknesses or hospitalizations.    Hospital Course:   1. Chest pain: Patient admitted for atypical substernal intermittent chest pain, after patient admitted to the hospital acute coronary syndrome was ruled out by 3 sets of cardiac enzymes and repeat EKG, all these tests were negative for evidence of ischemia. Patient did have slightly elevated D. dimers, CT angiography of the chest was done and showed no evidence of PE, no evidence of acute pulmonary disease. Patient is chest pain-free since the very next day of admission, she team safe to be discharged.  2. Questionable UTI: Patient did have urinalysis consistent with UTI with marked pyuria, patient was started on ciprofloxacin, which later switched to Levaquin. Patient urine culture comes back negative for bacterial growth. At that point patient got 3 days of antibiotics which was felt sufficient.  3. Hypertension: This is been stable throughout  the hospital stay, no changes done to her home medication regimen.  4. Tachycardia: During the admission she had intermittent tachycardia, with heart rate went up to the 140s-150s, EKG showed sinus tachycardia, as mentioned above there was no evidence of ischemia, CT angio ruled out PE.  Procedures:  None  Consultations:  None  Discharge Exam: Filed Vitals:   12/09/11 0425  BP: 123/85  Pulse: 79  Temp: 98.2 F (36.8 C)  Resp: 18   Filed Vitals:   12/08/11 0920 12/08/11 1542 12/08/11 2132 12/09/11 0425  BP: 116/76 118/76 122/79 123/85  Pulse: 111 112 92 79  Temp:  98.4 F (36.9 C) 98.3 F (36.8 C) 98.2 F (36.8 C)  TempSrc:  Oral Oral Oral  Resp:  18 20 18   Height:      Weight:      SpO2:  93% 96% 96%   General: Alert and awake, oriented x3, not in any acute distress. HEENT: anicteric sclera, pupils reactive to light and accommodation, EOMI CVS: S1-S2 clear, no murmur rubs or gallops Chest: clear to auscultation bilaterally, no wheezing, rales or rhonchi Abdomen: soft nontender, nondistended, normal bowel sounds, no organomegaly Extremities: no cyanosis, clubbing or edema noted bilaterally Neuro: Cranial nerves II-XII intact, no focal neurological deficits  Discharge Instructions  Discharge Orders    Future Orders Please Complete By Expires   Diet - low sodium heart healthy      Increase activity slowly        Medication List  As of 12/09/2011 12:03 PM   TAKE these medications         amLODipine 10 MG tablet   Commonly known as: NORVASC   Take 10 mg by mouth daily.  CLARITIN 10 MG tablet   Generic drug: loratadine   Take 10 mg by mouth daily.      diclofenac 50 MG EC tablet   Commonly known as: VOLTAREN   Take 50 mg by mouth 2 (two) times daily.      hydrochlorothiazide 25 MG tablet   Commonly known as: HYDRODIURIL   Take 25 mg by mouth daily.      nebivolol 10 MG tablet   Commonly known as: BYSTOLIC   Take 10 mg by mouth daily.            Follow-up Information    Follow up with [facility] in 1 week.          The results of significant diagnostics from this hospitalization (including imaging, microbiology, ancillary and laboratory) are listed below for reference.    Significant Diagnostic Studies: Dg Chest 2 View  12/06/2011  *RADIOLOGY REPORT*  Clinical Data: Weakness and fever  CHEST - 2 VIEW  Comparison: None.  Findings: Cardiac silhouette is mildly enlarged.  The aorta is ectatic.  Lungs are hyperinflated.  No effusion, infiltrate, or pneumothorax.  IMPRESSION:  1.  Emphysematous change. 2.  No acute cardiopulmonary findings.  Original Report Authenticated By: Genevive Bi, M.D.   Dg Lumbar Spine Complete  12/06/2011  *RADIOLOGY REPORT*  Clinical Data: Low back pain.  No known injury.  LUMBAR SPINE - COMPLETE 4+ VIEW  Comparison: None.  Findings: Advanced disc space narrowing L5-S1. Slight endplate concavity superior L4 without frank compression fracture.  Mild to moderate disc space narrowing L1-2 and L2-3.  Anatomic alignment. Advanced lower lumbar facet arthropathy.  Moderate stool burden. No osseous destructive lesion.  IMPRESSION: Degenerative changes as described.  No acute compression fracture or subluxation.  Original Report Authenticated By: Elsie Stain, M.D.   Ct Angio Chest W/cm &/or Wo Cm  12/08/2011  *RADIOLOGY REPORT*  Clinical Data: Elevated D-dimer, substernal chest pain  CT ANGIOGRAPHY CHEST  Technique:  Multidetector CT imaging of the chest using the standard protocol during bolus administration of intravenous contrast. Multiplanar reconstructed images including MIPs were obtained and reviewed to evaluate the vascular anatomy.  Contrast: 50mL OMNIPAQUE IOHEXOL 300 MG/ML  SOLN  Comparison: None  Findings: No filling defects in the pulmonary to suggest acute pulmonary embolism. There is a poor opacification of the lower lobe pulmonary arteries due to timing of the contrast bolus.  There is mild  atelectasis at the lung bases.  No consolidation or pneumothorax.  Airways are normal.  Limited view of the upper abdomen is unremarkable.  Limited view of the skeleton is unremarkable.  No acute findings of the aorta great vessels.  No pericardial fluid.  IMPRESSION:  1.  No evidence of acute pulmonary embolism.  Evaluation of the lower lobe segmental arteries is somewhat limited due to bolus timing. 2. Bibasilar atelectasis.  Original Report Authenticated By: Genevive Bi, M.D.    Microbiology: Recent Results (from the past 240 hour(s))  URINE CULTURE     Status: Normal   Collection Time   12/06/11  7:08 PM      Component Value Range Status Comment   Specimen Description URINE, CATHETERIZED   Final    Special Requests NONE   Final    Culture  Setup Time 12/06/2011 23:36   Final    Colony Count NO GROWTH   Final    Culture NO GROWTH   Final    Report Status 12/07/2011 FINAL   Final  Labs: Basic Metabolic Panel:  Lab 12/09/11 1610 12/08/11 0420 12/07/11 0505 12/06/11 2127 12/06/11 1538  NA 138 138 141 -- 141  K 3.9 3.9 3.8 -- 4.1  CL 103 105 106 -- 103  CO2 28 26 24  -- --  GLUCOSE 97 99 86 -- 114*  BUN 17 16 19  -- 23  CREATININE 1.17* 0.85 0.94 -- 1.10  CALCIUM 9.4 9.7 9.8 -- --  MG -- -- -- 2.1 --  PHOS -- -- -- 3.0 --   Liver Function Tests: No results found for this basename: AST:5,ALT:5,ALKPHOS:5,BILITOT:5,PROT:5,ALBUMIN:5 in the last 168 hours No results found for this basename: LIPASE:5,AMYLASE:5 in the last 168 hours No results found for this basename: AMMONIA:5 in the last 168 hours CBC:  Lab 12/07/11 0505 12/06/11 1538 12/06/11 1517  WBC 6.0 -- 9.2  NEUTROABS -- -- 8.4*  HGB 13.5 15.3* 14.4  HCT 39.6 45.0 42.0  MCV 86.8 -- 85.4  PLT 193 -- 238   Cardiac Enzymes:  Lab 12/07/11 1324 12/07/11 0505 12/06/11 2127 12/06/11 1517  CKTOTAL 489* 413* 415* --  CKMB 3.3 3.3 3.6 --  CKMBINDEX -- -- -- --  TROPONINI <0.30 <0.30 <0.30 <0.30   BNP: BNP (last 3  results)  Basename 12/06/11 2127  PROBNP 127.4   CBG:  Lab 12/09/11 0759 12/08/11 0742 12/07/11 0732  GLUCAP 102* 92 88    Time coordinating discharge: 40 minutes  Signed:  Rockland Kotarski A  [facility] 12/09/2011, 12:03 PM  Addendum  Patient's son Mr. Becky Leach called and stated that his mother is not on Bystolic.  I asked him to ignore this medication on her discharge papers and continue amlodipine and HCTZ.  They will followup with her primary care physician on Friday, 12/16/2011.  Becky Leach A Keahi Mccarney 12/10/2011, 12:30 PM

## 2011-12-09 NOTE — Progress Notes (Signed)
Talked to patient about DCP, HHC choices; Patient chose [facility]; Norberta Keens RN with [facility] called for arrangements and rolling walker with wheels; B Lobbyist, BSN, [place]

## 2011-12-09 NOTE — Progress Notes (Signed)
IV d/c'd, tele d/c'd.  Walker delivered to room by home health agency.  Family present, reviewed paperwork for discharge with pt and family who denied further questions or concerns at this time.  VSS.  Pt discharged to home with family members.  Ardyth Gal, RN 12/09/2011

## 2011-12-16 ENCOUNTER — Inpatient Hospital Stay (HOSPITAL_COMMUNITY)
Admission: EM | Admit: 2011-12-16 | Discharge: 2011-12-23 | DRG: 378 | Disposition: A | Discharge status: Home / Self Care | Payer: Medicare Other | Attending: Internal Medicine | Admitting: Internal Medicine

## 2011-12-16 ENCOUNTER — Encounter (HOSPITAL_COMMUNITY): Payer: Self-pay | Admitting: *Deleted

## 2011-12-16 DIAGNOSIS — R197 Diarrhea, unspecified: Secondary | ICD-10-CM

## 2011-12-16 DIAGNOSIS — R079 Chest pain, unspecified: Secondary | ICD-10-CM

## 2011-12-16 DIAGNOSIS — E876 Hypokalemia: Secondary | ICD-10-CM | POA: Diagnosis not present

## 2011-12-16 DIAGNOSIS — N950 Postmenopausal bleeding: Secondary | ICD-10-CM | POA: Clinically undetermined

## 2011-12-16 DIAGNOSIS — K5730 Diverticulosis of large intestine without perforation or abscess without bleeding: Secondary | ICD-10-CM | POA: Diagnosis present

## 2011-12-16 DIAGNOSIS — Z681 Body mass index (BMI) 19 or less, adult: Secondary | ICD-10-CM

## 2011-12-16 DIAGNOSIS — N390 Urinary tract infection, site not specified: Secondary | ICD-10-CM

## 2011-12-16 DIAGNOSIS — D126 Benign neoplasm of colon, unspecified: Secondary | ICD-10-CM | POA: Diagnosis present

## 2011-12-16 DIAGNOSIS — E860 Dehydration: Secondary | ICD-10-CM

## 2011-12-16 DIAGNOSIS — I10 Essential (primary) hypertension: Secondary | ICD-10-CM | POA: Diagnosis present

## 2011-12-16 DIAGNOSIS — K633 Ulcer of intestine: Secondary | ICD-10-CM | POA: Diagnosis present

## 2011-12-16 DIAGNOSIS — K648 Other hemorrhoids: Secondary | ICD-10-CM | POA: Diagnosis present

## 2011-12-16 DIAGNOSIS — K625 Hemorrhage of anus and rectum: Principal | ICD-10-CM

## 2011-12-16 DIAGNOSIS — N719 Inflammatory disease of uterus, unspecified: Secondary | ICD-10-CM | POA: Diagnosis present

## 2011-12-16 DIAGNOSIS — R000 Tachycardia, unspecified: Secondary | ICD-10-CM | POA: Diagnosis present

## 2011-12-16 DIAGNOSIS — R32 Unspecified urinary incontinence: Secondary | ICD-10-CM | POA: Diagnosis present

## 2011-12-16 DIAGNOSIS — R64 Cachexia: Secondary | ICD-10-CM | POA: Diagnosis present

## 2011-12-16 HISTORY — DX: Urinary tract infection, site not specified: N39.0

## 2011-12-16 HISTORY — DX: Tachycardia, unspecified: R00.0

## 2011-12-16 LAB — BASIC METABOLIC PANEL
CO2: 28 mEq/L (ref 19–32)
Calcium: 10.3 mg/dL (ref 8.4–10.5)
Creatinine, Ser: 1.29 mg/dL — ABNORMAL HIGH (ref 0.50–1.10)
GFR calc non Af Amer: 38 mL/min — ABNORMAL LOW (ref >90)

## 2011-12-16 LAB — CBC WITH DIFFERENTIAL/PLATELET
Basophils Absolute: 0 10*3/uL (ref 0.0–0.1)
Basophils Relative: 0 % (ref 0–1)
Eosinophils Absolute: 0.1 10*3/uL (ref 0.0–0.7)
Eosinophils Relative: 1 % (ref 0–5)
HCT: 40 % (ref 36.0–46.0)
Hemoglobin: 14.3 g/dL (ref 12.0–15.0)
Lymphocytes Relative: 13 % (ref 12–46)
Lymphs Abs: 1.2 K/uL (ref 0.7–4.0)
MCH: 29.8 pg (ref 26.0–34.0)
MCHC: 35.8 g/dL (ref 30.0–36.0)
MCV: 83.3 fL (ref 78.0–100.0)
Monocytes Absolute: 0.7 10*3/uL (ref 0.1–1.0)
Monocytes Relative: 7 % (ref 3–12)
Neutro Abs: 7.6 K/uL (ref 1.7–7.7)
Neutrophils Relative %: 80 % — ABNORMAL HIGH (ref 43–77)
Platelets: 250 10*3/uL (ref 150–400)
RBC: 4.8 MIL/uL (ref 3.87–5.11)
RDW: 13.9 % (ref 11.5–15.5)
WBC: 9.5 10*3/uL (ref 4.0–10.5)

## 2011-12-16 LAB — BASIC METABOLIC PANEL WITH GFR
BUN: 38 mg/dL — ABNORMAL HIGH (ref 6–23)
Chloride: 96 meq/L (ref 96–112)
GFR calc Af Amer: 44 mL/min — ABNORMAL LOW (ref >90)
Glucose, Bld: 117 mg/dL — ABNORMAL HIGH (ref 70–99)
Potassium: 4 meq/L (ref 3.5–5.1)
Sodium: 136 meq/L (ref 135–145)

## 2011-12-16 LAB — PROTIME-INR
INR: 1.11 (ref 0.00–1.49)
Prothrombin Time: 14.5 s (ref 11.6–15.2)

## 2011-12-16 LAB — OCCULT BLOOD, POC DEVICE: Fecal Occult Bld: POSITIVE

## 2011-12-16 LAB — APTT: aPTT: 27 s (ref 24–37)

## 2011-12-16 MED ORDER — SODIUM CHLORIDE 0.9 % IV BOLUS (SEPSIS)
500.0000 mL | Freq: Once | INTRAVENOUS | Status: AC
  Administered 2011-12-16: 500 mL via INTRAVENOUS

## 2011-12-16 NOTE — ED Notes (Signed)
Pt reports "I'm passing blood." also c/o groin and leg pain.

## 2011-12-16 NOTE — ED Provider Notes (Signed)
History     CSN: [id number]  Arrival date & time 12/16/11  4098   First MD Initiated Contact with Patient 12/16/11 2143      Chief Complaint  Patient presents with  . Rectal Bleeding    (Consider location/radiation/quality/duration/timing/severity/associated sxs/prior treatment) HPI Comments: Pt reports bleeding from rectum for several days, denies constipation or diarrhea.  She complains of abd pain today shortly after coming to the ED which seems to have passed.  She denies prior h/o rectal bleeding, hemorrhoids.  Never had a colonscopy in the past.  She reports pain at rectum.  No N/V.  She endorses feeling fatigued, light headed and weak in general.    Patient is a 78 y.o. female presenting with hematochezia. The history is provided by the patient.  Rectal Bleeding  Associated symptoms include abdominal pain and rectal pain. Pertinent negatives include no fever, no diarrhea, no nausea, no vomiting and no chest pain.    Past Medical History  Diagnosis Date  . Hypertension   . Arthritis     Past Surgical History  Procedure Date  . No past surgeries     History reviewed. No pertinent family history.  History  Substance Use Topics  . Smoking status: Never Smoker   . Smokeless tobacco: Never Used  . Alcohol Use: No    OB History    Grav Para Term Preterm Abortions TAB SAB Ect Mult Living                  Review of Systems  Constitutional: Positive for fatigue. Negative for fever, chills and appetite change.  Respiratory: Negative for shortness of breath.   Cardiovascular: Negative for chest pain.  Gastrointestinal: Positive for abdominal pain, blood in stool, hematochezia, anal bleeding and rectal pain. Negative for nausea, vomiting, diarrhea and constipation.  Neurological: Positive for weakness and light-headedness. Negative for syncope.  All other systems reviewed and are negative.    Allergies  Penicillins  Home Medications   Current Outpatient Rx    Name Route Sig Dispense Refill  . AMLODIPINE BESYLATE 10 MG PO TABS Oral Take 10 mg by mouth daily.    Marland Kitchen HYDROCHLOROTHIAZIDE 25 MG PO TABS Oral Take 25 mg by mouth daily.    Marland Kitchen LORATADINE 10 MG PO TABS Oral Take 10 mg by mouth daily.    Marland Kitchen METRONIDAZOLE 500 MG PO TABS Oral Take 500 mg by mouth 2 (two) times daily.    . TRAMADOL HCL 50 MG PO TABS Oral Take 50 mg by mouth 2 (two) times daily as needed. For pain      BP 130/84  Pulse 102  Temp 98 F (36.7 C) (Oral)  Resp 13  SpO2 97%  Physical Exam  Nursing note and vitals reviewed. Constitutional: She is oriented to person, place, and time. She appears well-developed and well-nourished.  HENT:  Head: Normocephalic and atraumatic.  Eyes: Pupils are equal, round, and reactive to light. No scleral icterus.  Neck: Neck supple.  Cardiovascular: Regular rhythm and normal heart sounds.  Tachycardia present.   No murmur heard. Pulmonary/Chest: Effort normal. No respiratory distress. She has no wheezes.  Abdominal: Soft. She exhibits no distension. There is no tenderness. There is no rebound and no guarding.  Genitourinary:          Chaperone present during exam  Musculoskeletal: She exhibits no edema.  Neurological: She is alert and oriented to person, place, and time.  Skin: Skin is warm and dry.  Psychiatric: She has  a normal mood and affect.    ED Course  Procedures (including critical care time)  Labs Reviewed  CBC WITH DIFFERENTIAL - Abnormal; Notable for the following:    Neutrophils Relative 80 (*)     All other components within normal limits  BASIC METABOLIC PANEL - Abnormal; Notable for the following:    Glucose, Bld 117 (*)     BUN 38 (*)     Creatinine, Ser 1.29 (*)     GFR calc non Af Amer 38 (*)     GFR calc Af Amer 44 (*)     All other components within normal limits  OCCULT BLOOD, POC DEVICE  APTT  PROTIME-INR   No results found.   1. Rectal bleeding   2. Dehydration     ECG at time 23:09 shows  sinus rhythm at rate 96, normal axis, no ST or T wave abns.  Rate slower compared to ECG from Dec 08, 2011.    RA sat is 96% which I interpret to be normal  11:12 PM Pt's BUN is elevated to 38 with mildly elevated Cr of 1.29 suggesting dehydration.  This makes me concerned that pt is indeed anemic once IVF resuscitation has fully progressed.  Given her symptoms of light headed, fatigue and her age, will likely admit for observation and consultation with surgeon or gastroenterologist.    11:33 PM Spoke to Dr. Selena Batten who will admit.     MDM  Pt with gross rectal bleeding, I suspect most likely extremal hemorrhoid.  Pt's HR is 122, normotensive, intact mentation at baseline I suspect.  Will give IVF, check Hgb, may require admission since pt is endorsing fatigue, weakness, lightheadedness and is tachycardic.  IVF resuscitation here.          Gavin Pound. Oletta Lamas, MD 12/16/11 2333

## 2011-12-17 ENCOUNTER — Encounter (HOSPITAL_COMMUNITY): Payer: Self-pay | Admitting: Internal Medicine

## 2011-12-17 DIAGNOSIS — R197 Diarrhea, unspecified: Secondary | ICD-10-CM

## 2011-12-17 DIAGNOSIS — K625 Hemorrhage of anus and rectum: Principal | ICD-10-CM

## 2011-12-17 DIAGNOSIS — I517 Cardiomegaly: Secondary | ICD-10-CM

## 2011-12-17 LAB — DIFFERENTIAL
Basophils Relative: 0 % (ref 0–1)
Eosinophils Absolute: 0 10*3/uL (ref 0.0–0.7)
Eosinophils Relative: 1 % (ref 0–5)
Monocytes Absolute: 0.7 10*3/uL (ref 0.1–1.0)
Monocytes Relative: 9 % (ref 3–12)

## 2011-12-17 LAB — TYPE AND SCREEN: Antibody Screen: NEGATIVE

## 2011-12-17 LAB — CARDIAC PANEL(CRET KIN+CKTOT+MB+TROPI)
CK, MB: 2 ng/mL (ref 0.3–4.0)
Troponin I: <0.3 ng/mL (ref <0.30)
Troponin I: <0.3 ng/mL (ref <0.30)

## 2011-12-17 LAB — COMPREHENSIVE METABOLIC PANEL
ALT: 10 U/L (ref 0–35)
AST: 11 U/L (ref 0–37)
CO2: 27 mEq/L (ref 19–32)
Calcium: 9.4 mg/dL (ref 8.4–10.5)
Potassium: 3.5 mEq/L (ref 3.5–5.1)
Sodium: 138 mEq/L (ref 135–145)
Total Protein: 6.2 g/dL (ref 6.0–8.3)

## 2011-12-17 LAB — CBC
HCT: 38.1 % (ref 36.0–46.0)
HCT: 38.3 % (ref 36.0–46.0)
Hemoglobin: 13.2 g/dL (ref 12.0–15.0)
MCH: 29.2 pg (ref 26.0–34.0)
MCHC: 34.6 g/dL (ref 30.0–36.0)
MCHC: 34.5 g/dL (ref 30.0–36.0)
MCV: 84.9 fL (ref 78.0–100.0)
RDW: 13.9 % (ref 11.5–15.5)

## 2011-12-17 MED ORDER — SODIUM CHLORIDE 0.9 % IJ SOLN
3.0000 mL | Freq: Two times a day (BID) | INTRAMUSCULAR | Status: DC
  Administered 2011-12-17 – 2011-12-23 (×4): 3 mL via INTRAVENOUS

## 2011-12-17 MED ORDER — HEPARIN SODIUM (PORCINE) 5000 UNIT/ML IJ SOLN
5000.0000 [IU] | Freq: Three times a day (TID) | INTRAMUSCULAR | Status: DC
Start: 2011-12-17 — End: 2011-12-18
  Administered 2011-12-17 – 2011-12-18 (×4): 5000 [IU] via SUBCUTANEOUS
  Filled 2011-12-17 (×7): qty 1

## 2011-12-17 MED ORDER — METRONIDAZOLE IN NACL 5-0.79 MG/ML-% IV SOLN
500.0000 mg | Freq: Three times a day (TID) | INTRAVENOUS | Status: DC
  Administered 2011-12-17 – 2011-12-19 (×6): 500 mg via INTRAVENOUS
  Filled 2011-12-17 (×7): qty 100

## 2011-12-17 MED ORDER — LORATADINE 10 MG PO TABS
10.0000 mg | ORAL_TABLET | Freq: Every day | ORAL | Status: DC
  Administered 2011-12-17 – 2011-12-23 (×7): 10 mg via ORAL
  Filled 2011-12-17 (×7): qty 1

## 2011-12-17 MED ORDER — TRAMADOL HCL 50 MG PO TABS
50.0000 mg | ORAL_TABLET | Freq: Two times a day (BID) | ORAL | Status: DC | PRN
  Administered 2011-12-17 – 2011-12-21 (×2): 50 mg via ORAL
  Filled 2011-12-17 (×3): qty 1

## 2011-12-17 MED ORDER — PNEUMOCOCCAL VAC POLYVALENT 25 MCG/0.5ML IJ INJ
0.5000 mL | INJECTION | INTRAMUSCULAR | Status: AC
  Administered 2011-12-18: 0.5 mL via INTRAMUSCULAR
  Filled 2011-12-17 (×2): qty 0.5

## 2011-12-17 MED ORDER — ONDANSETRON HCL 4 MG/2ML IJ SOLN: 4.0000 mg | Freq: Three times a day (TID) | INTRAMUSCULAR | Status: AC | PRN

## 2011-12-17 MED ORDER — METOPROLOL SUCCINATE 12.5 MG HALF TABLET
12.5000 mg | ORAL_TABLET | Freq: Two times a day (BID) | ORAL | Status: DC
  Administered 2011-12-17 – 2011-12-23 (×13): 12.5 mg via ORAL
  Filled 2011-12-17 (×14): qty 1

## 2011-12-17 MED ORDER — PANTOPRAZOLE SODIUM 40 MG PO TBEC
40.0000 mg | DELAYED_RELEASE_TABLET | Freq: Every day | ORAL | Status: DC
  Administered 2011-12-17 – 2011-12-23 (×7): 40 mg via ORAL
  Filled 2011-12-17 (×7): qty 1

## 2011-12-17 MED ORDER — SODIUM CHLORIDE 0.9 % IV SOLN
INTRAVENOUS | Status: AC
  Administered 2011-12-17: 03:00:00 via INTRAVENOUS

## 2011-12-17 MED ORDER — AMLODIPINE BESYLATE 10 MG PO TABS
10.0000 mg | ORAL_TABLET | Freq: Every day | ORAL | Status: DC
  Administered 2011-12-17 – 2011-12-23 (×7): 10 mg via ORAL
  Filled 2011-12-17 (×7): qty 1

## 2011-12-17 NOTE — Progress Notes (Addendum)
  Echocardiogram 2D Echocardiogram has been performed.  Jorje Guild 12/17/2011, 8:32 AM

## 2011-12-17 NOTE — Progress Notes (Signed)
Patient had some SVT nonsustained. Patient denies any pain/distress. Dr. Susie Cassette informed, order given for toprol. Will continue to assess patient. F/U with plan of care.

## 2011-12-17 NOTE — H&P (Signed)
Becky Leach is an 78 y.o. female.    Cc: [facility]  Chief Complaint: rectal bleeding HPI: 78 yo female with hx of hypertension, recently admitted for uti, and tachycardia c/o diarrhea, which was empirically treated with flagyl started Tuesday and then this evening c/o slight brbpr since Sunday.  Denies fever, chills, n/v, heartburn, abd pain, constipation, black stool.  Pt has not had colonoscopy in the past.  Pt noted to have heme + stool, hgb intact at 14.3  ,  Pt will be admitted for rectal bleeding.    Past Medical History  Diagnosis Date  . Hypertension   . Arthritis     Past Surgical History  Procedure Date  . Dilation and curettage of uterus   . Tonsillectomy and adenoidectomy     Family History  Problem Relation Age of Onset  . Arthritis Mother   . Heart attack Mother    Social History:  reports that she has never smoked. She has never used smokeless tobacco. She reports that she does not drink alcohol or use illicit drugs.  Allergies:  Allergies  Allergen Reactions  . Penicillins Rash     (Not in a hospital admission)  Results for orders placed during the hospital encounter of 12/16/11 (from the past 48 hour(s))  CBC WITH DIFFERENTIAL     Status: Abnormal   Collection Time   12/16/11  9:44 PM      Component Value Range Comment   WBC 9.5  4.0 - 10.5 K/uL    RBC 4.80  3.87 - 5.11 MIL/uL    Hemoglobin 14.3  12.0 - 15.0 g/dL    HCT 16.1  09.6 - 04.5 %    MCV 83.3  78.0 - 100.0 fL    MCH 29.8  26.0 - 34.0 pg    MCHC 35.8  30.0 - 36.0 g/dL    RDW 40.9  81.1 - 91.4 %    Platelets 250  150 - 400 K/uL    Neutrophils Relative 80 (*) 43 - 77 %    Neutro Abs 7.6  1.7 - 7.7 K/uL    Lymphocytes Relative 13  12 - 46 %    Lymphs Abs 1.2  0.7 - 4.0 K/uL    Monocytes Relative 7  3 - 12 %    Monocytes Absolute 0.7  0.1 - 1.0 K/uL    Eosinophils Relative 1  0 - 5 %    Eosinophils Absolute 0.1  0.0 - 0.7 K/uL    Basophils Relative 0  0 - 1 %    Basophils Absolute  0.0  0.0 - 0.1 K/uL   BASIC METABOLIC PANEL     Status: Abnormal   Collection Time   12/16/11  9:44 PM      Component Value Range Comment   Sodium 136  135 - 145 mEq/L    Potassium 4.0  3.5 - 5.1 mEq/L    Chloride 96  96 - 112 mEq/L    CO2 28  19 - 32 mEq/L    Glucose, Bld 117 (*) 70 - 99 mg/dL    BUN 38 (*) 6 - 23 mg/dL    Creatinine, Ser 7.82 (*) 0.50 - 1.10 mg/dL    Calcium 95.6  8.4 - 10.5 mg/dL    GFR calc non Af Amer 38 (*) >90 mL/min    GFR calc Af Amer 44 (*) >90 mL/min   APTT     Status: Normal   Collection Time   12/16/11  9:44 PM      Component Value Range Comment   aPTT 27  24 - 37 seconds   PROTIME-INR     Status: Normal   Collection Time   12/16/11  9:44 PM      Component Value Range Comment   Prothrombin Time 14.5  11.6 - 15.2 seconds    INR 1.11  0.00 - 1.49   OCCULT BLOOD, POC DEVICE     Status: Normal   Collection Time   12/16/11 10:09 PM      Component Value Range Comment   Fecal Occult Bld POSITIVE      No results found.  Review of Systems  Constitutional: Negative for fever, chills, weight loss, malaise/fatigue and diaphoresis.  HENT: Negative for hearing loss, ear pain, nosebleeds, congestion, sore throat, neck pain, tinnitus and ear discharge.   Eyes: Negative for blurred vision, double vision, photophobia, pain, discharge and redness.  Respiratory: Negative for cough, hemoptysis, sputum production, shortness of breath, wheezing and stridor.   Cardiovascular: Negative for chest pain, palpitations, orthopnea, claudication, leg swelling and PND.  Gastrointestinal: Positive for diarrhea and blood in stool. Negative for heartburn, nausea, vomiting, abdominal pain, constipation and melena.  Genitourinary: Negative for dysuria, urgency, frequency, hematuria and flank pain.  Musculoskeletal: Negative for myalgias, back pain, joint pain and falls.  Skin: Negative for itching and rash.  Neurological: Negative for dizziness, tingling, tremors, sensory change,  speech change, focal weakness, seizures, loss of consciousness, weakness and headaches.  Endo/Heme/Allergies: Negative for environmental allergies and polydipsia. Does not bruise/bleed easily.  Psychiatric/Behavioral: Negative for depression, suicidal ideas, hallucinations and substance abuse. The patient is not nervous/anxious and does not have insomnia.     Blood pressure 130/84, pulse 102, temperature 98 F (36.7 C), temperature source Oral, resp. rate 13, SpO2 97.00%. Physical Exam  Constitutional: She is oriented to person, place, and time. She appears well-developed and well-nourished. No distress.  HENT:  Head: Normocephalic and atraumatic.  Mouth/Throat: No oropharyngeal exudate.       Mucous membranes dry  Eyes: Conjunctivae and EOM are normal. Pupils are equal, round, and reactive to light. Right eye exhibits no discharge. Left eye exhibits no discharge. No scleral icterus.  Neck: Normal range of motion. Neck supple. No JVD present. No tracheal deviation present. No thyromegaly present.  Cardiovascular: Normal rate, regular rhythm and normal heart sounds.  Exam reveals no gallop and no friction rub.   No murmur heard. Respiratory: Effort normal and breath sounds normal. No stridor. No respiratory distress. She has no wheezes. She has no rales. She exhibits no tenderness.  GI: Soft. Bowel sounds are normal. She exhibits no distension and no mass. There is no tenderness. There is no rebound and no guarding.  Musculoskeletal: Normal range of motion. She exhibits no edema and no tenderness.  Lymphadenopathy:    She has no cervical adenopathy.  Neurological: She is alert and oriented to person, place, and time. She has normal reflexes. She displays normal reflexes. No cranial nerve deficit. She exhibits normal muscle tone. Coordination normal.  Skin: Skin is warm and dry. No rash noted. She is not diaphoretic. No erythema. No pallor.  Psychiatric: She has a normal mood and affect. Her  behavior is normal. Judgment and thought content normal.     Assessment/Plan Rectal Bleeding,  Npo ,  Please obtain gi consultation in am regarding possible need for colonsocopy, check cbc, in am to ensure hgb stability Diarrhea: check stool for fecal leukocytes, culture, and c. Diff.  ,  empiric tx with flagyl iv Tachycardia:  Tele, cycle cardiac markers Dehydration:  Hydrate with ns iv Hypertension: cont amlodipine, cont hydrochlorothiazide   Keah Lamba 12/17/2011, 12:24 AM

## 2011-12-17 NOTE — Progress Notes (Signed)
Patient seen and examined Admitted for tachycardia and diarrhea Started on Flagyl empirically as patient was recently hospitalized for UTI  Smear of blood on tissue paper when the patient was wiped clean No obvious hematochezia or melena Stool culture has been ordered C. difficile PCR pending CBC stable  His symptoms resolved, anticipate discharge tomorrow,otherwise GI  consult

## 2011-12-18 ENCOUNTER — Inpatient Hospital Stay (HOSPITAL_COMMUNITY): Payer: Medicare Other

## 2011-12-18 DIAGNOSIS — K625 Hemorrhage of anus and rectum: Secondary | ICD-10-CM

## 2011-12-18 LAB — URINALYSIS, ROUTINE W REFLEX MICROSCOPIC
Bilirubin Urine: NEGATIVE
Ketones, ur: NEGATIVE mg/dL
Nitrite: NEGATIVE
Protein, ur: NEGATIVE mg/dL
Specific Gravity, Urine: 1.01 (ref 1.005–1.030)
Urobilinogen, UA: 0.2 mg/dL (ref 0.0–1.0)

## 2011-12-18 LAB — CBC
MCH: 29.2 pg (ref 26.0–34.0)
MCH: 29.2 pg (ref 26.0–34.0)
MCHC: 35.2 g/dL (ref 30.0–36.0)
MCV: 84.4 fL (ref 78.0–100.0)
Platelets: 253 10*3/uL (ref 150–400)
Platelets: 257 10*3/uL (ref 150–400)
RBC: 4.73 MIL/uL (ref 3.87–5.11)
RBC: 4.69 MIL/uL (ref 3.87–5.11)
RDW: 13.4 % (ref 11.5–15.5)

## 2011-12-18 LAB — CARDIAC PANEL(CRET KIN+CKTOT+MB+TROPI)
CK, MB: 1.9 ng/mL (ref 0.3–4.0)
CK, MB: 2.5 ng/mL (ref 0.3–4.0)
Relative Index: INVALID (ref 0.0–2.5)
Relative Index: INVALID (ref 0.0–2.5)
Total CK: 39 U/L (ref 7–177)
Troponin I: <0.3 ng/mL (ref <0.30)

## 2011-12-18 MED ORDER — IOHEXOL 300 MG/ML  SOLN
100.0000 mL | Freq: Once | INTRAMUSCULAR | Status: AC | PRN
  Administered 2011-12-18: 80 mL via INTRAVENOUS

## 2011-12-18 MED ORDER — SODIUM CHLORIDE 0.9 % IV SOLN
INTRAVENOUS | Status: DC
  Administered 2011-12-18 – 2011-12-21 (×5): via INTRAVENOUS

## 2011-12-18 MED ORDER — HYDROCORTISONE ACETATE 25 MG RE SUPP: 25.0000 mg | Freq: Two times a day (BID) | RECTAL | Status: DC

## 2011-12-18 NOTE — Discharge Summary (Signed)
Physician Discharge Summary  Becky Leach MRN: [id number] DOB/AGE: November 08, 1929 78 y.o.  PCP: Quitman Livings, MD   Admit date: 12/16/2011 Discharge date: 12/18/2011  Discharge Diagnoses:    Rectal bleeding Diarrhea .  Hypertension    .  Arthritis     Past Surgical History   Procedure  Date   .  Dilation and curettage of uterus    .  Tonsillectomy and adenoidectomy       Medication List  As of 12/18/2011  8:42 AM   TAKE these medications         amLODipine 10 MG tablet   Commonly known as: NORVASC   Take 10 mg by mouth daily.      CLARITIN 10 MG tablet   Generic drug: loratadine   Take 10 mg by mouth daily.      hydrochlorothiazide 25 MG tablet   Commonly known as: HYDRODIURIL   Take 25 mg by mouth daily.      metroNIDAZOLE 500 MG tablet   Commonly known as: FLAGYL   Take 500 mg by mouth 2 (two) times daily.      traMADol 50 MG tablet   Commonly known as: ULTRAM   Take 50 mg by mouth 2 (two) times daily as needed. For pain            Discharge Condition: sTable   Disposition: 01-Home or Self Care   Consults: None  Significant Diagnostic Studies: Dg Chest 2 View  12/06/2011  *RADIOLOGY REPORT*  Clinical Data: Weakness and fever  CHEST - 2 VIEW  Comparison: None.  Findings: Cardiac silhouette is mildly enlarged.  The aorta is ectatic.  Lungs are hyperinflated.  No effusion, infiltrate, or pneumothorax.  IMPRESSION:  1.  Emphysematous change. 2.  No acute cardiopulmonary findings.  Original Report Authenticated By: Genevive Bi, M.D.   Dg Lumbar Spine Complete  12/06/2011  *RADIOLOGY REPORT*  Clinical Data: Low back pain.  No known injury.  LUMBAR SPINE - COMPLETE 4+ VIEW  Comparison: None.  Findings: Advanced disc space narrowing L5-S1. Slight endplate concavity superior L4 without frank compression fracture.  Mild to moderate disc space narrowing L1-2 and L2-3.  Anatomic alignment. Advanced lower lumbar facet arthropathy.  Moderate stool burden. No  osseous destructive lesion.  IMPRESSION: Degenerative changes as described.  No acute compression fracture or subluxation.  Original Report Authenticated By: Elsie Stain, M.D.   Ct Angio Chest W/cm &/or Wo Cm  12/08/2011  *RADIOLOGY REPORT*  Clinical Data: Elevated D-dimer, substernal chest pain  CT ANGIOGRAPHY CHEST  Technique:  Multidetector CT imaging of the chest using the standard protocol during bolus administration of intravenous contrast. Multiplanar reconstructed images including MIPs were obtained and reviewed to evaluate the vascular anatomy.  Contrast: 50mL OMNIPAQUE IOHEXOL 300 MG/ML  SOLN  Comparison: None  Findings: No filling defects in the pulmonary to suggest acute pulmonary embolism. There is a poor opacification of the lower lobe pulmonary arteries due to timing of the contrast bolus.  There is mild atelectasis at the lung bases.  No consolidation or pneumothorax.  Airways are normal.  Limited view of the upper abdomen is unremarkable.  Limited view of the skeleton is unremarkable.  No acute findings of the aorta great vessels.  No pericardial fluid.  IMPRESSION:  1.  No evidence of acute pulmonary embolism.  Evaluation of the lower lobe segmental arteries is somewhat limited due to bolus timing. 2. Bibasilar atelectasis.  Original Report Authenticated By: Genevive Bi, M.D.  Microbiology: No results found for this or any previous visit (from the past 240 hour(s)).   Labs: Results for orders placed during the hospital encounter of 12/16/11 (from the past 48 hour(s))  CBC WITH DIFFERENTIAL     Status: Abnormal   Collection Time   12/16/11  9:44 PM      Component Value Range Comment   WBC 9.5  4.0 - 10.5 K/uL    RBC 4.80  3.87 - 5.11 MIL/uL    Hemoglobin 14.3  12.0 - 15.0 g/dL    HCT 16.1  09.6 - 04.5 %    MCV 83.3  78.0 - 100.0 fL    MCH 29.8  26.0 - 34.0 pg    MCHC 35.8  30.0 - 36.0 g/dL    RDW 40.9  81.1 - 91.4 %    Platelets 250  150 - 400 K/uL    Neutrophils  Relative 80 (*) 43 - 77 %    Neutro Abs 7.6  1.7 - 7.7 K/uL    Lymphocytes Relative 13  12 - 46 %    Lymphs Abs 1.2  0.7 - 4.0 K/uL    Monocytes Relative 7  3 - 12 %    Monocytes Absolute 0.7  0.1 - 1.0 K/uL    Eosinophils Relative 1  0 - 5 %    Eosinophils Absolute 0.1  0.0 - 0.7 K/uL    Basophils Relative 0  0 - 1 %    Basophils Absolute 0.0  0.0 - 0.1 K/uL   BASIC METABOLIC PANEL     Status: Abnormal   Collection Time   12/16/11  9:44 PM      Component Value Range Comment   Sodium 136  135 - 145 mEq/L    Potassium 4.0  3.5 - 5.1 mEq/L    Chloride 96  96 - 112 mEq/L    CO2 28  19 - 32 mEq/L    Glucose, Bld 117 (*) 70 - 99 mg/dL    BUN 38 (*) 6 - 23 mg/dL    Creatinine, Ser 7.82 (*) 0.50 - 1.10 mg/dL    Calcium 95.6  8.4 - 10.5 mg/dL    GFR calc non Af Amer 38 (*) >90 mL/min    GFR calc Af Amer 44 (*) >90 mL/min   APTT     Status: Normal   Collection Time   12/16/11  9:44 PM      Component Value Range Comment   aPTT 27  24 - 37 seconds   PROTIME-INR     Status: Normal   Collection Time   12/16/11  9:44 PM      Component Value Range Comment   Prothrombin Time 14.5  11.6 - 15.2 seconds    INR 1.11  0.00 - 1.49   OCCULT BLOOD, POC DEVICE     Status: Normal   Collection Time   12/16/11 10:09 PM      Component Value Range Comment   Fecal Occult Bld POSITIVE     TYPE AND SCREEN     Status: Normal   Collection Time   12/17/11  1:15 AM      Component Value Range Comment   ABO/RH(D) A POS      Antibody Screen NEG      Sample Expiration 12/20/2011     TSH     Status: Normal   Collection Time   12/17/11  1:20 AM      Component Value Range Comment  TSH 0.936  0.350 - 4.500 uIU/mL   ABO/RH     Status: Normal   Collection Time   12/17/11  2:29 AM      Component Value Range Comment   ABO/RH(D) A POS     COMPREHENSIVE METABOLIC PANEL     Status: Abnormal   Collection Time   12/17/11  5:55 AM      Component Value Range Comment   Sodium 138  135 - 145 mEq/L    Potassium 3.5  3.5  - 5.1 mEq/L    Chloride 102  96 - 112 mEq/L    CO2 27  19 - 32 mEq/L    Glucose, Bld 99  70 - 99 mg/dL    BUN 26 (*) 6 - 23 mg/dL    Creatinine, Ser 1.30  0.50 - 1.10 mg/dL    Calcium 9.4  8.4 - 86.5 mg/dL    Total Protein 6.2  6.0 - 8.3 g/dL    Albumin 3.4 (*) 3.5 - 5.2 g/dL    AST 11  0 - 37 U/L    ALT 10  0 - 35 U/L    Alkaline Phosphatase 56  39 - 117 U/L    Total Bilirubin 0.5  0.3 - 1.2 mg/dL    GFR calc non Af Amer 56 (*) >90 mL/min    GFR calc Af Amer 65 (*) >90 mL/min   CBC     Status: Normal   Collection Time   12/17/11  5:55 AM      Component Value Range Comment   WBC 7.1  4.0 - 10.5 K/uL    RBC 4.52  3.87 - 5.11 MIL/uL    Hemoglobin 13.2  12.0 - 15.0 g/dL    HCT 78.4  69.6 - 29.5 %    MCV 84.3  78.0 - 100.0 fL    MCH 29.2  26.0 - 34.0 pg    MCHC 34.6  30.0 - 36.0 g/dL    RDW 28.4  13.2 - 44.0 %    Platelets 215  150 - 400 K/uL   DIFFERENTIAL     Status: Normal   Collection Time   12/17/11  5:55 AM      Component Value Range Comment   Neutrophils Relative 76  43 - 77 %    Neutro Abs 5.4  1.7 - 7.7 K/uL    Lymphocytes Relative 14  12 - 46 %    Lymphs Abs 1.0  0.7 - 4.0 K/uL    Monocytes Relative 9  3 - 12 %    Monocytes Absolute 0.7  0.1 - 1.0 K/uL    Eosinophils Relative 1  0 - 5 %    Eosinophils Absolute 0.0  0.0 - 0.7 K/uL    Basophils Relative 0  0 - 1 %    Basophils Absolute 0.0  0.0 - 0.1 K/uL   CBC     Status: Normal   Collection Time   12/17/11 12:20 PM      Component Value Range Comment   WBC 7.0  4.0 - 10.5 K/uL    RBC 4.51  3.87 - 5.11 MIL/uL    Hemoglobin 13.2  12.0 - 15.0 g/dL    HCT 10.2  72.5 - 36.6 %    MCV 84.9  78.0 - 100.0 fL    MCH 29.3  26.0 - 34.0 pg    MCHC 34.5  30.0 - 36.0 g/dL    RDW 44.0  34.7 - 42.5 %  Platelets 240  150 - 400 K/uL   CARDIAC PANEL(CRET KIN+CKTOT+MB+TROPI)     Status: Normal   Collection Time   12/17/11 12:20 PM      Component Value Range Comment   Total CK 57  7 - 177 U/L    CK, MB 2.2  0.3 - 4.0 ng/mL      Troponin I <0.30  <0.30 ng/mL    Relative Index RELATIVE INDEX IS INVALID  0.0 - 2.5   CARDIAC PANEL(CRET KIN+CKTOT+MB+TROPI)     Status: Normal   Collection Time   12/17/11  6:00 PM      Component Value Range Comment   Total CK 41  7 - 177 U/L    CK, MB 2.0  0.3 - 4.0 ng/mL    Troponin I <0.30  <0.30 ng/mL    Relative Index RELATIVE INDEX IS INVALID  0.0 - 2.5   CARDIAC PANEL(CRET KIN+CKTOT+MB+TROPI)     Status: Normal   Collection Time   12/17/11 11:59 PM      Component Value Range Comment   Total CK 39  7 - 177 U/L    CK, MB 1.9  0.3 - 4.0 ng/mL    Troponin I <0.30  <0.30 ng/mL    Relative Index RELATIVE INDEX IS INVALID  0.0 - 2.5   CBC     Status: Normal   Collection Time   12/18/11  5:40 AM      Component Value Range Comment   WBC 6.4  4.0 - 10.5 K/uL    RBC 4.73  3.87 - 5.11 MIL/uL    Hemoglobin 13.8  12.0 - 15.0 g/dL    HCT 16.1  09.6 - 04.5 %    MCV 84.4  78.0 - 100.0 fL    MCH 29.2  26.0 - 34.0 pg    MCHC 34.6  30.0 - 36.0 g/dL    RDW 40.9  81.1 - 91.4 %    Platelets 253  150 - 400 K/uL   CARDIAC PANEL(CRET KIN+CKTOT+MB+TROPI)     Status: Normal   Collection Time   12/18/11  5:40 AM      Component Value Range Comment   Total CK 42  7 - 177 U/L    CK, MB 2.1  0.3 - 4.0 ng/mL    Troponin I <0.30  <0.30 ng/mL    Relative Index RELATIVE INDEX IS INVALID  0.0 - 2.5      HPI :78 yo female with hx of hypertension, recently admitted for uti, and tachycardia came in with complaints of diarrhea, which was empirically treated with flagyl started Tuesday by her primary care provider and then  c/o slight brbpr since Sunday. Denies fever, chills, n/v, heartburn, abd pain, constipation, black stool. Pt has not had colonoscopy in the past. Pt noted to have heme + stool, hgb intact at 14.3 , she was admitted for further evaluation     HOSPITAL COURSE #1 rectal bleeding suspect the patient has hemorrhoids aggravated by the diarrhea, will prescribe Anusol HC, will need elective  colonoscopy, as the patient has never had a colonoscopy before. The need a repeat CBC. Serial CBC in the hospital remained stable. Patient did not require any urgent GI consultation.  #2 diarrhea antibiotic induced versus C. difficile. The patient's diarrhea resolved, it was attempted to send off a stool sample however the patient did not have any diarrhea. She will continue with Flagyl for another 5 days and then DC  #3 dehydration the patient  was hydrated with IV fluids #4 hypertension continue Norvasc and hydrochlorothiazide    Discharge Exam:  Blood pressure 120/80, pulse 74, temperature 98 F (36.7 C), temperature source Oral, resp. rate 18, height 5\' 5"  (1.651 m), weight 41.7 kg (91 lb 14.9 oz), SpO2 94.00%.   Constitutional: She is oriented to person, place, and time. She appears well-developed and well-nourished. No distress.  HENT:  Head: Normocephalic and atraumatic.  Mouth/Throat: No oropharyngeal exudate.  Mucous membranes dry  Eyes: Conjunctivae and EOM are normal. Pupils are equal, round, and reactive to light. Right eye exhibits no discharge. Left eye exhibits no discharge. No scleral icterus.  Neck: Normal range of motion. Neck supple. No JVD present. No tracheal deviation present. No thyromegaly present.  Cardiovascular: Normal rate, regular rhythm and normal heart sounds. Exam reveals no gallop and no friction rub.  No murmur heard.  Respiratory: Effort normal and breath sounds normal. No stridor. No respiratory distress. She has no wheezes. She has no rales. She exhibits no tenderness.  GI: Soft. Bowel sounds are normal. She exhibits no distension and no mass. There is no tenderness. There is no rebound and no guarding.  Musculoskeletal: Normal range of motion. She exhibits no edema and no tenderness.  Lymphadenopathy:  She has no cervical adenopathy.  Neurological: She is alert and oriented to person, place, and time. She has normal reflexes. She displays normal  reflexes. No cranial nerve deficit. She exhibits normal muscle tone. Coordination normal.  Skin: Skin is warm and dry. No rash noted. She is not diaphoretic. No erythema. No pallor.  Psychiatric: She has a normal mood and affect. Her behavior is normal. Judgment and thought content normal.     ADDENDUM DC HELD DUE TO RECURRENT BLEEDING UA NEGATIVE CT ABDOMEN SHOWS CONSTIPATION  PELVIC USG IN AM    Signed: Itati Brocksmith 12/18/2011, 8:42 AM

## 2011-12-18 NOTE — Progress Notes (Signed)
In patient room to I/O cath patient for urine sample, noted moderate amount of bright red blood with clots between patient's thigh and on the sheet under the patient as well, not sure where blood is from,  More blood noted over the virginal area than the rectum , patient stated she did not feel it. Patient cleaned and Dr. Susie Cassette notified. Patient denies any distress. Will  Continue to assess patient. I/O cath patient for 750cc of urine, amber color no blood noted in the urine.

## 2011-12-19 ENCOUNTER — Inpatient Hospital Stay (HOSPITAL_COMMUNITY): Payer: Medicare Other

## 2011-12-19 DIAGNOSIS — N950 Postmenopausal bleeding: Secondary | ICD-10-CM | POA: Clinically undetermined

## 2011-12-19 LAB — PROLACTIN: Prolactin: 7.1 ng/mL

## 2011-12-19 LAB — TSH: TSH: 1.894 u[IU]/mL (ref 0.350–4.500)

## 2011-12-19 MED ORDER — POLYETHYLENE GLYCOL 3350 17 G PO PACK
17.0000 g | PACK | Freq: Every day | ORAL | Status: DC
  Administered 2011-12-20 – 2011-12-23 (×2): 17 g via ORAL
  Filled 2011-12-19 (×5): qty 1

## 2011-12-19 MED ORDER — LACTULOSE 10 GM/15ML PO SOLN
30.0000 g | Freq: Two times a day (BID) | ORAL | Status: DC
  Administered 2011-12-19 (×2): 30 g via ORAL
  Filled 2011-12-19 (×4): qty 45

## 2011-12-19 MED ORDER — LACTULOSE 10 GM/15ML PO SOLN
30.0000 g | Freq: Every day | ORAL | Status: DC
  Filled 2011-12-19: qty 45

## 2011-12-19 NOTE — Care Management Note (Signed)
    Page 1 of 2   12/23/2011     4:37:09 PM   CARE MANAGEMENT NOTE 12/23/2011  Patient:  Becky Leach, Becky Leach   Account Number:  [id number]  Date Initiated:  12/19/2011  Documentation initiated by:  Lanier Clam  Subjective/Objective Assessment:   ADMITTED W/RECTAL BLEED.     Action/Plan:   FROM HOME W/SON.HAS CANE,RW.   Anticipated DC Date:  12/23/2011   Anticipated DC Plan:  HOME W HOME HEALTH SERVICES      DC Planning Services  CM consult      Choice offered to / List presented to:  C-1 Patient        HH arranged  HH-2 PT  HH-3 OT      [facility] agency  [facility]   Status of service:  Completed, signed off Medicare Important Message given?   (If response is "NO", the following Medicare IM given date fields will be blank) Date Medicare IM given:   Date Additional Medicare IM given:    Discharge Disposition:  HOME W HOME HEALTH SERVICES  Per UR Regulation:  Reviewed for med. necessity/level of care/duration of stay  If discussed at Long Length of Stay Meetings, dates discussed:    Comments:  12/22/11 Braeson Rupe RN,BSN NCM [phone] S/P COLONOSCOPY YESTERDAY-EXTENSIVE DIVERTULOSIS,HEMMORHOIDS.GI FOLLOWING.D/C PLAN HOME.IF HH NEEDED CAN ARRANGE.RECOMMEND HHRN-DISEASE MGMNT.  12/20/11 Shelbey Spindler RN,BSN NCM [phone] FOR COLONOSCOPY IN AM.  12/19/11 Clearnce Leja RN,BSN NCM [phone]

## 2011-12-19 NOTE — Consult Note (Signed)
Subjective:   HPI  The patient is an 78 year old female who we are asked to see in consultation in regards to rectal bleeding. The patient was admitted on July 20 with complaints of diarrhea and also noticed some rectal bleeding. Her stools were checke and were reported positive for occult blood. The patient has never had a colonoscopy. She said that she had either diarrhea or constipation while here in the hospital. She was seen by gynecology today who noticed a large amount of stool on rectal exam. She is not having any abdominal pain. She has no specific upper GI symptoms.  Review of Systems Denies chest pain or shortness of breath  Past Medical History  Diagnosis Date  . Hypertension   . Arthritis   . UTI (lower urinary tract infection)   . Tachycardia    Past Surgical History  Procedure Date  . Dilation and curettage of uterus   . Tonsillectomy and adenoidectomy    History   Social History  . Marital Status: Single    Spouse Name: N/A    Number of Children: 4  . Years of Education: N/A   Occupational History  . Not on file.   Social History Main Topics  . Smoking status: Never Smoker   . Smokeless tobacco: Never Used  . Alcohol Use: No  . Drug Use: No  . Sexually Active: No   Other Topics Concern  . Not on file   Social History Narrative   Born: [place], [place]   family history includes Arthritis in her mother and Heart attack in her mother. Current facility-administered medications:0.9 %  sodium chloride infusion, , Intravenous, Continuous, Richarda Overlie, MD, Last Rate: 75 mL/hr at 12/19/11 0100;  amLODipine (NORVASC) tablet 10 mg, 10 mg, Oral, Daily, Massie Maroon, MD, 10 mg at 12/19/11 0951;  lactulose (CHRONULAC) 10 GM/15ML solution 30 g, 30 g, Oral, BID, Richarda Overlie, MD, 30 g at 12/19/11 0952 loratadine (CLARITIN) tablet 10 mg, 10 mg, Oral, Daily, Massie Maroon, MD, 10 mg at 12/19/11 [phone];  metoprolol succinate (TOPROL-XL) 24 hr tablet 12.5 mg, 12.5 mg, Oral, BID,  Richarda Overlie, MD, 12.5 mg at 12/19/11 0952;  pantoprazole (PROTONIX) EC tablet 40 mg, 40 mg, Oral, Q1200, Richarda Overlie, MD, 40 mg at 12/19/11 0951 pneumococcal 23 valent vaccine (PNU-IMMUNE) injection 0.5 mL, 0.5 mL, Intramuscular, Tomorrow-1000, Massie Maroon, MD, 0.5 mL at 12/18/11 1636;  sodium chloride 0.9 % injection 3 mL, 3 mL, Intravenous, Q12H, Massie Maroon, MD, 3 mL at 12/18/11 5621;  traMADol Janean Sark) tablet 50 mg, 50 mg, Oral, BID PRN, Massie Maroon, MD, 50 mg at 12/17/11 2124;  DISCONTD: lactulose (CHRONULAC) 10 GM/15ML solution 30 g, 30 g, Oral, Daily, Richarda Overlie, MD DISCONTD: metroNIDAZOLE (FLAGYL) IVPB 500 mg, 500 mg, Intravenous, Q8H, Massie Maroon, MD, 500 mg at 12/19/11 0200 Allergies  Allergen Reactions  . Penicillins Rash     Objective:     BP 114/83  Pulse 110  Temp 97.8 F (36.6 C) (Oral)  Resp 18  Ht 5\' 5"  (1.651 m)  Wt 42.4 kg (93 lb 7.6 oz)  BMI 15.56 kg/m2  SpO2 96%  She is in no distress  Heart regular rhythm no murmurs  Lungs clear  Abdomen: Bowel sounds normal, soft, nontender  Laboratory No components found with this basename: d1      Assessment:     Rectal bleeding and heme positive stool      Plan:     I discussed  colonoscopy with her and she is agreeable. We will proceed in a couple of days after a bowel prep.  Lab Results  Component Value Date   HGB 13.7 12/18/2011   HGB 13.8 12/18/2011   HGB 13.2 12/17/2011   HCT 38.9 12/18/2011   HCT 39.9 12/18/2011   HCT 38.3 12/17/2011   ALKPHOS 56 12/17/2011   AST 11 12/17/2011   ALT 10 12/17/2011

## 2011-12-19 NOTE — Progress Notes (Signed)
[facility] PROGRESS NOTE  Becky Leach WUJ:[id number] DOB: 01-18-1930 DOA: 12/16/2011 PCP: Quitman Livings, MD  Assessment/Plan: Principal Problem:  *Rectal bleeding Active Problems:  Diarrhea  Rectal Bleeding, clear liquid , GYNE CONSULT / GI CONSULT , pelvic ultrasound area [place]  GI consulted, Dr. [name] consulted May need a flexible sigmoidoscopy   Diarrhea: Resolved, now constipated, with stool diffusely throughout the colon on the CAT scan, check stool for fecal leukocytes, culture, and c. Diff. , empiric tx with flagyl iv  Tachycardia: Tele, cycle cardiac markers  Dehydration: Hydrate with ns iv  Hypertension: cont amlodipine, cont hydrochlorothiazide      Code Status: full Family Communication: family updated about patient's clinical progress Disposition Plan:  As above         HPI/Subjective: Denied any bleeding overnight, had a large amount yesterday evening  Objective: Filed Vitals:   12/18/11 0608 12/18/11 0937 12/18/11 1447 12/19/11 0509  BP: 120/80 109/77 109/77 107/73  Pulse: 74 87 81 104  Temp: 98 F (36.7 C)  97.6 F (36.4 C) 98.6 F (37 C)  TempSrc: Oral  Oral Oral  Resp: 18  20 18   Height:      Weight: 41.7 kg (91 lb 14.9 oz)   42.4 kg (93 lb 7.6 oz)  SpO2: 94%  94% 96%    Intake/Output Summary (Last 24 hours) at 12/19/11 1113 Last data filed at 12/19/11 0600  Gross per 24 hour  Intake    840 ml  Output   2000 ml  Net  -1160 ml    Exam:  HENT:  Head: Atraumatic.  Nose: Nose normal.  Mouth/Throat: Oropharynx is clear and moist.  Eyes: Conjunctivae are normal. Pupils are equal, round, and reactive to light. No scleral icterus.  Neck: Neck supple. No tracheal deviation present.  Cardiovascular: Normal rate, regular rhythm, normal heart sounds and intact distal pulses.  Pulmonary/Chest: Effort normal and breath sounds normal. No respiratory distress.  Abdominal: Soft. Normal appearance and bowel sounds are normal. She  exhibits no distension. There is no tenderness.  Musculoskeletal: She exhibits no edema and no tenderness.  Neurological: She is alert. No cranial nerve deficit.    Data Reviewed: Basic Metabolic Panel:  Lab 12/17/11 9562 12/16/11 2144  NA 138 136  K 3.5 4.0  CL 102 96  CO2 27 28  GLUCOSE 99 117*  BUN 26* 38*  CREATININE 0.94 1.29*  CALCIUM 9.4 10.3  MG -- --  PHOS -- --    Liver Function Tests:  Lab 12/17/11 0555  AST 11  ALT 10  ALKPHOS 56  BILITOT 0.5  PROT 6.2  ALBUMIN 3.4*   No results found for this basename: LIPASE:5,AMYLASE:5 in the last 168 hours No results found for this basename: AMMONIA:5 in the last 168 hours  CBC:  Lab 12/18/11 1530 12/18/11 0540 12/17/11 1220 12/17/11 0555 12/16/11 2144  WBC 7.0 6.4 7.0 7.1 9.5  NEUTROABS -- -- -- 5.4 7.6  HGB 13.7 13.8 13.2 13.2 14.3  HCT 38.9 39.9 38.3 38.1 40.0  MCV 82.9 84.4 84.9 84.3 83.3  PLT 257 253 240 215 250    Cardiac Enzymes:  Lab 12/18/11 1150 12/18/11 0540 12/17/11 2359 12/17/11 1800 12/17/11 1220  CKTOTAL 56 42 39 41 57  CKMB 2.5 2.1 1.9 2.0 2.2  CKMBINDEX -- -- -- -- --  TROPONINI <0.30 <0.30 <0.30 <0.30 <0.30   BNP (last 3 results)  Basename 12/06/11 2127  PROBNP 127.4     CBG: No results found  for this basename: GLUCAP:5 in the last 168 hours  No results found for this or any previous visit (from the past 240 hour(s)).   Studies: Dg Chest 2 View  12/06/2011  *RADIOLOGY REPORT*  Clinical Data: Weakness and fever  CHEST - 2 VIEW  Comparison: None.  Findings: Cardiac silhouette is mildly enlarged.  The aorta is ectatic.  Lungs are hyperinflated.  No effusion, infiltrate, or pneumothorax.  IMPRESSION:  1.  Emphysematous change. 2.  No acute cardiopulmonary findings.  Original Report Authenticated By: Genevive Bi, M.D.   Dg Lumbar Spine Complete  12/06/2011  *RADIOLOGY REPORT*  Clinical Data: Low back pain.  No known injury.  LUMBAR SPINE - COMPLETE 4+ VIEW  Comparison: None.   Findings: Advanced disc space narrowing L5-S1. Slight endplate concavity superior L4 without frank compression fracture.  Mild to moderate disc space narrowing L1-2 and L2-3.  Anatomic alignment. Advanced lower lumbar facet arthropathy.  Moderate stool burden. No osseous destructive lesion.  IMPRESSION: Degenerative changes as described.  No acute compression fracture or subluxation.  Original Report Authenticated By: Elsie Stain, M.D.   Ct Angio Chest W/cm &/or Wo Cm  12/08/2011  *RADIOLOGY REPORT*  Clinical Data: Elevated D-dimer, substernal chest pain  CT ANGIOGRAPHY CHEST  Technique:  Multidetector CT imaging of the chest using the standard protocol during bolus administration of intravenous contrast. Multiplanar reconstructed images including MIPs were obtained and reviewed to evaluate the vascular anatomy.  Contrast: 50mL OMNIPAQUE IOHEXOL 300 MG/ML  SOLN  Comparison: None  Findings: No filling defects in the pulmonary to suggest acute pulmonary embolism. There is a poor opacification of the lower lobe pulmonary arteries due to timing of the contrast bolus.  There is mild atelectasis at the lung bases.  No consolidation or pneumothorax.  Airways are normal.  Limited view of the upper abdomen is unremarkable.  Limited view of the skeleton is unremarkable.  No acute findings of the aorta great vessels.  No pericardial fluid.  IMPRESSION:  1.  No evidence of acute pulmonary embolism.  Evaluation of the lower lobe segmental arteries is somewhat limited due to bolus timing. 2. Bibasilar atelectasis.  Original Report Authenticated By: Genevive Bi, M.D.   Ct Abdomen Pelvis W Contrast  12/18/2011  *RADIOLOGY REPORT*  Clinical Data: Hematuria.  Abdominal pain.  Rectal bleeding.  CT ABDOMEN AND PELVIS WITH CONTRAST  Technique:  Multidetector CT imaging of the abdomen and pelvis was performed following the standard protocol during bolus administration of intravenous contrast.  Contrast: 80mL OMNIPAQUE  IOHEXOL 300 MG/ML  SOLN  Comparison: No priors.  Findings:  Lung Bases: Trace left-sided pleural effusion layering dependently. Slight prominence of the interstitial markings in the lung bases bilaterally is nonspecific.  Abdomen/Pelvis:  Study is slightly limited by gross patient motion. These limitations in mind, the appearance of the gallbladder, pancreas and bilateral adrenal glands is unremarkable.  There is a focal area of decreased enhancement adjacent to the falciform ligament of the liver, likely to represent a benign perfusion anomaly.  The remainder of the liver is otherwise unremarkable in appearance.  A subcentimeter low attenuation lesion in the spleen is too small to characterize, but is statistically benign.  There are several low attenuation lesions in the kidneys bilaterally, most of which are too small to definitively characterize, but are statistically favored to represent small cysts.  The largest renal lesion measures 1 cm and the interpolar region of the right kidney, and is compatible with a cyst.  Large volume of  well formed stool in the rectum could suggest constipation.  Numerous colonic diverticula, without definite surrounding inflammatory changes to strongly suggest an acute diverticulitis.  However, there is a trace volume of fluid in the left pericolic gutter.  No pneumoperitoneum.  No pathologic distension of small bowel.  Uterus and ovaries are atrophic. Urinary bladder is unremarkable in appearance.  Musculoskeletal: There are no aggressive appearing lytic or blastic lesions noted in the visualized portions of the skeleton.  IMPRESSION: 1.  Trace volume of ascites in the left pericolic gutter.  The cause of this is uncertain. 2.  Large volume of stool in the rectal vault and throughout the more proximal colon suggestive of constipation. 3.  Numerous colonic diverticula, without definite surrounding inflammatory changes to strongly suggest the presence of acute diverticulitis.   Given the small amount of fluid in the left pericolic gutter, however, clinical correlation for signs and symptoms of diverticulitis may be warranted.  4.  Numerous low attenuation renal lesions bilaterally, that the majority of which are too small to definitively characterize. These are favored to represent cysts. However, in this patient with history of hematuria, if there is strong clinical concern for small neoplasm, this could be further evaluated with renal MRI if clinically indicated. 5.  Trace left-sided pleural effusion layering dependently.  Original Report Authenticated By: Florencia Reasons, M.D.    Scheduled Meds:   . amLODipine  10 mg Oral Daily  . lactulose  30 g Oral BID  . loratadine  10 mg Oral Daily  . metoprolol succinate  12.5 mg Oral BID  . pantoprazole  40 mg Oral Q1200  . pneumococcal 23 valent vaccine  0.5 mL Intramuscular Tomorrow-1000  . sodium chloride  3 mL Intravenous Q12H  . DISCONTD: heparin  5,000 Units Subcutaneous Q8H  . DISCONTD: lactulose  30 g Oral Daily  . DISCONTD: metronidazole  500 mg Intravenous Q8H   Continuous Infusions:   . sodium chloride 75 mL/hr at 12/19/11 0100    Principal Problem:  *Rectal bleeding Active Problems:  Diarrhea    Time spent: 40 minutes   [facility]  [facility] Pager [phone]. If 8PM-8AM, please contact night-coverage at [email or web address], password [facility] 12/19/2011, 11:13 AM  LOS: 3 days

## 2011-12-19 NOTE — Consult Note (Signed)
Reason for Consult: Asked to consult on this 78 year-old with an episode of vaginal bleeding  Becky Leach is an 78 y.o. female.   Recently admitted with diarrhea.  Onset of acute episode of vaginal bleeding 2 days ago.  The patient denies a h/o prior episodes.  No concomitant symptoms.   Pertinent Gynecological History: Last pap: Unknown  No LMP recorded. Patient is postmenopausal.    Past Medical History  Diagnosis Date  . Hypertension   . Arthritis   . UTI (lower urinary tract infection)   . Tachycardia     Past Surgical History  Procedure Date  . Dilation and curettage of uterus   . Tonsillectomy and adenoidectomy     Family History  Problem Relation Age of Onset  . Arthritis Mother   . Heart attack Mother     Social History:  reports that she has never smoked. She has never used smokeless tobacco. She reports that she does not drink alcohol or use illicit drugs.  Allergies:  Allergies  Allergen Reactions  . Penicillins Rash    Medications: I have reviewed the patient's current medications.  Review of Systems  Genitourinary:       See HPI    Blood pressure 107/73, pulse 104, temperature 98.6 F (37 C), temperature source Oral, resp. rate 18, height 5\' 5"  (1.651 m), weight 42.4 kg (93 lb 7.6 oz), SpO2 96.00%. Physical Exam  Constitutional:       Cachectic  GI:       No organomegaly  Genitourinary: Vagina normal.      Speculum exam:  No heme.  Pap smear taken.  Endometrial biopsy performed.  The uterus sounded to 4 cm.  Purulent material noted.  Minimal tissue.  Bimanual/rectovaginal exam limited.  Large stool in rectum Results for orders placed during the hospital encounter of 12/16/11 (from the past 48 hour(s))  CARDIAC PANEL(CRET KIN+CKTOT+MB+TROPI)     Status: Normal   Collection Time   12/17/11  6:00 PM      Component Value Range Comment   Total CK 41  7 - 177 U/L    CK, MB 2.0  0.3 - 4.0 ng/mL    Troponin I <0.30  <0.30 ng/mL    Relative  Index RELATIVE INDEX IS INVALID  0.0 - 2.5   CARDIAC PANEL(CRET KIN+CKTOT+MB+TROPI)     Status: Normal   Collection Time   12/17/11 11:59 PM      Component Value Range Comment   Total CK 39  7 - 177 U/L    CK, MB 1.9  0.3 - 4.0 ng/mL    Troponin I <0.30  <0.30 ng/mL    Relative Index RELATIVE INDEX IS INVALID  0.0 - 2.5   CBC     Status: Normal   Collection Time   12/18/11  5:40 AM      Component Value Range Comment   WBC 6.4  4.0 - 10.5 K/uL    RBC 4.73  3.87 - 5.11 MIL/uL    Hemoglobin 13.8  12.0 - 15.0 g/dL    HCT 21.3  08.6 - 57.8 %    MCV 84.4  78.0 - 100.0 fL    MCH 29.2  26.0 - 34.0 pg    MCHC 34.6  30.0 - 36.0 g/dL    RDW 46.9  62.9 - 52.8 %    Platelets 253  150 - 400 K/uL   CARDIAC PANEL(CRET KIN+CKTOT+MB+TROPI)     Status: Normal   Collection Time  12/18/11  5:40 AM      Component Value Range Comment   Total CK 42  7 - 177 U/L    CK, MB 2.1  0.3 - 4.0 ng/mL    Troponin I <0.30  <0.30 ng/mL    Relative Index RELATIVE INDEX IS INVALID  0.0 - 2.5   CARDIAC PANEL(CRET KIN+CKTOT+MB+TROPI)     Status: Normal   Collection Time   12/18/11 11:50 AM      Component Value Range Comment   Total CK 56  7 - 177 U/L    CK, MB 2.5  0.3 - 4.0 ng/mL    Troponin I <0.30  <0.30 ng/mL    Relative Index RELATIVE INDEX IS INVALID  0.0 - 2.5   URINALYSIS, ROUTINE W REFLEX MICROSCOPIC     Status: Normal   Collection Time   12/18/11 12:20 PM      Component Value Range Comment   Color, Urine YELLOW  YELLOW    APPearance CLEAR  CLEAR    Specific Gravity, Urine 1.010  1.005 - 1.030    pH 6.5  5.0 - 8.0    Glucose, UA NEGATIVE  NEGATIVE mg/dL    Hgb urine dipstick NEGATIVE  NEGATIVE    Bilirubin Urine NEGATIVE  NEGATIVE    Ketones, ur NEGATIVE  NEGATIVE mg/dL    Protein, ur NEGATIVE  NEGATIVE mg/dL    Urobilinogen, UA 0.2  0.0 - 1.0 mg/dL    Nitrite NEGATIVE  NEGATIVE    Leukocytes, UA NEGATIVE  NEGATIVE MICROSCOPIC NOT DONE ON URINES WITH NEGATIVE PROTEIN, BLOOD, LEUKOCYTES, NITRITE, OR  GLUCOSE <1000 mg/dL.  CBC     Status: Normal   Collection Time   12/18/11  3:30 PM      Component Value Range Comment   WBC 7.0  4.0 - 10.5 K/uL    RBC 4.69  3.87 - 5.11 MIL/uL    Hemoglobin 13.7  12.0 - 15.0 g/dL    HCT 30.8  65.7 - 84.6 %    MCV 82.9  78.0 - 100.0 fL    MCH 29.2  26.0 - 34.0 pg    MCHC 35.2  30.0 - 36.0 g/dL    RDW 96.2  95.2 - 84.1 %    Platelets 257  150 - 400 K/uL   TSH     Status: Normal   Collection Time   12/19/11  6:43 AM      Component Value Range Comment   TSH 1.894  0.350 - 4.500 uIU/mL   PROLACTIN     Status: Normal   Collection Time   12/19/11  6:43 AM      Component Value Range Comment   Prolactin 7.1       US Transvaginal Non-ob  12/19/2011  *RADIOLOGY REPORT*  Clinical Data: Uterine mass  TRANSABDOMINAL AND TRANSVAGINAL ULTRASOUND OF PELVIS Technique:  Both transabdominal and transvaginal ultrasound examinations of the pelvis were performed. Transabdominal technique was performed for global imaging of the pelvis including uterus, ovaries, adnexal regions, and pelvic cul-de-sac.  It was necessary to proceed with endovaginal exam following the transabdominal exam to visualize the endometrium and ovaries, not seen transabdominally.  Comparison:  CT 12/18/2011  Findings:  Uterus: Not well visualized transvaginally due to bowel gas.  6.3 x 3.2 x 2.6 cm.  No focal abnormality.  Endometrium: 2 mm.  Fluid noted within the endometrial canal.  No focal endometrial abnormality.  Right ovary:  Not visualized.  No adnexal mass.  Left ovary:  Not visualized.  No adnexal mass.  Other findings: No free fluid  IMPRESSION: Fluid within the endometrial canal but no focal endometrial thickening.  This may be asymptomatic or could be due to cervical stenosis.  In the absence of vaginal bleeding, this likely requires no further specific imaging follow-up.  Original Report Authenticated By: Harrel Lemon, M.D.   US Pelvis Complete  12/19/2011  *RADIOLOGY REPORT*  Clinical  Data: Uterine mass  TRANSABDOMINAL AND TRANSVAGINAL ULTRASOUND OF PELVIS Technique:  Both transabdominal and transvaginal ultrasound examinations of the pelvis were performed. Transabdominal technique was performed for global imaging of the pelvis including uterus, ovaries, adnexal regions, and pelvic cul-de-sac.  It was necessary to proceed with endovaginal exam following the transabdominal exam to visualize the endometrium and ovaries, not seen transabdominally.  Comparison:  CT 12/18/2011  Findings:  Uterus: Not well visualized transvaginally due to bowel gas.  6.3 x 3.2 x 2.6 cm.  No focal abnormality.  Endometrium: 2 mm.  Fluid noted within the endometrial canal.  No focal endometrial abnormality.  Right ovary:  Not visualized.  No adnexal mass.  Left ovary: Not visualized.  No adnexal mass.  Other findings: No free fluid  IMPRESSION: Fluid within the endometrial canal but no focal endometrial thickening.  This may be asymptomatic or could be due to cervical stenosis.  In the absence of vaginal bleeding, this likely requires no further specific imaging follow-up.  Original Report Authenticated By: Harrel Lemon, M.D.   Ct Abdomen Pelvis W Contrast  12/18/2011  *RADIOLOGY REPORT*  Clinical Data: Hematuria.  Abdominal pain.  Rectal bleeding.  CT ABDOMEN AND PELVIS WITH CONTRAST  Technique:  Multidetector CT imaging of the abdomen and pelvis was performed following the standard protocol during bolus administration of intravenous contrast.  Contrast: 80mL OMNIPAQUE IOHEXOL 300 MG/ML  SOLN  Comparison: No priors.  Findings:  Lung Bases: Trace left-sided pleural effusion layering dependently. Slight prominence of the interstitial markings in the lung bases bilaterally is nonspecific.  Abdomen/Pelvis:  Study is slightly limited by gross patient motion. These limitations in mind, the appearance of the gallbladder, pancreas and bilateral adrenal glands is unremarkable.  There is a focal area of decreased  enhancement adjacent to the falciform ligament of the liver, likely to represent a benign perfusion anomaly.  The remainder of the liver is otherwise unremarkable in appearance.  A subcentimeter low attenuation lesion in the spleen is too small to characterize, but is statistically benign.  There are several low attenuation lesions in the kidneys bilaterally, most of which are too small to definitively characterize, but are statistically favored to represent small cysts.  The largest renal lesion measures 1 cm and the interpolar region of the right kidney, and is compatible with a cyst.  Large volume of well formed stool in the rectum could suggest constipation.  Numerous colonic diverticula, without definite surrounding inflammatory changes to strongly suggest an acute diverticulitis.  However, there is a trace volume of fluid in the left pericolic gutter.  No pneumoperitoneum.  No pathologic distension of small bowel.  Uterus and ovaries are atrophic. Urinary bladder is unremarkable in appearance.  Musculoskeletal: There are no aggressive appearing lytic or blastic lesions noted in the visualized portions of the skeleton.  IMPRESSION: 1.  Trace volume of ascites in the left pericolic gutter.  The cause of this is uncertain. 2.  Large volume of stool in the rectal vault and throughout the more proximal colon suggestive of constipation. 3.  Numerous colonic diverticula,  without definite surrounding inflammatory changes to strongly suggest the presence of acute diverticulitis.  Given the small amount of fluid in the left pericolic gutter, however, clinical correlation for signs and symptoms of diverticulitis may be warranted.  4.  Numerous low attenuation renal lesions bilaterally, that the majority of which are too small to definitively characterize. These are favored to represent cysts. However, in this patient with history of hematuria, if there is strong clinical concern for small neoplasm, this could be further  evaluated with renal MRI if clinically indicated. 5.  Trace left-sided pleural effusion layering dependently.  Original Report Authenticated By: Florencia Reasons, M.D.    Assessment/Plan: Possible recent vaginal bleed.  Radiologic/exam c/w pyometra/cervical stenosis.  Thin endometrial lining--possible atrophic endometritis in the setting of above. Lesions near buttock secondary to macerated skin--diarrhea; cutaneous candida; doubt neoplastic process or genital HSV  -->treat endometritis empirically with Doxycycline 100 mg po BID x 10d -->Treat ulcers/macerated skin i.e. Apply desitin, antifungal -->will follow-up in my office after discharge Roseanna Rainbow [phone] 12/19/2011

## 2011-12-20 LAB — COMPREHENSIVE METABOLIC PANEL
ALT: 9 U/L (ref 0–35)
Albumin: 2.9 g/dL — ABNORMAL LOW (ref 3.5–5.2)
Calcium: 8.7 mg/dL (ref 8.4–10.5)
GFR calc Af Amer: >90 mL/min (ref >90)
Glucose, Bld: 109 mg/dL — ABNORMAL HIGH (ref 70–99)
Sodium: 137 mEq/L (ref 135–145)
Total Protein: 5.7 g/dL — ABNORMAL LOW (ref 6.0–8.3)

## 2011-12-20 MED ORDER — LACTULOSE 10 GM/15ML PO SOLN
30.0000 g | Freq: Three times a day (TID) | ORAL | Status: DC
  Administered 2011-12-20 – 2011-12-23 (×6): 30 g via ORAL
  Filled 2011-12-20 (×12): qty 45

## 2011-12-20 MED ORDER — POTASSIUM CHLORIDE CRYS ER 20 MEQ PO TBCR
30.0000 meq | EXTENDED_RELEASE_TABLET | Freq: Two times a day (BID) | ORAL | Status: AC
  Administered 2011-12-20 (×2): 30 meq via ORAL
  Filled 2011-12-20 (×2): qty 1

## 2011-12-20 MED ORDER — DOXYCYCLINE HYCLATE 100 MG PO CAPS
100.0000 mg | ORAL_CAPSULE | Freq: Two times a day (BID) | ORAL | Status: DC
  Administered 2011-12-20 – 2011-12-23 (×7): 100 mg via ORAL
  Filled 2011-12-20 (×8): qty 1

## 2011-12-20 MED ORDER — PEG 3350-KCL-NA BICARB-NACL 420 G PO SOLR
4000.0000 mL | Freq: Once | ORAL | Status: AC
  Administered 2011-12-20: 4000 mL via ORAL

## 2011-12-20 MED ORDER — DOXYCYCLINE HYCLATE 100 MG PO TABS
100.0000 mg | ORAL_TABLET | Freq: Two times a day (BID) | ORAL | Status: DC
  Filled 2011-12-20 (×2): qty 1

## 2011-12-20 MED FILL — Metronidazole in NaCl 0.79% IV Soln 500 MG/100ML: INTRAVENOUS | Qty: 100 | Status: AC

## 2011-12-20 NOTE — Progress Notes (Signed)
[facility] PROGRESS NOTE  Becky Leach WUJ:[id number] DOB: [birth year] DOA: 12/16/2011 PCP: Quitman Livings, MD  Assessment/Plan: Principal Problem:  *Rectal bleeding Active Problems:  Diarrhea  Post-menopausal bleeding  Diarrhea: Resolved, now constipated, with stool diffusely throughout the colon on the CAT scan, stool studies discontinued, IV Flagyl discontinued Rectal bleeding GI consulted, and gynecology consulted, anticipated colonoscopy tomorrow, hemoglobin has remained stable Tachycardia: Tele, cycle cardiac markers negative Dehydration: Hydrate with ns iv  Hypertension: cont amlodipine, cont hydrochlorothiazide  Endometritis treat endometritis empirically with Doxycycline 100 mg po BID x 10d   Code Status: full Family Communication: family updated about patient's clinical progress Disposition Plan:  As above       HPI/Subjective: 78 year old female presented with bleeding from her pelvis, questionable sources vagina versus rectal Straight cath urine analysis did not show any RBC of gross hematuria therefore I doubt that she has hematuria She does not need any further urologic workup Pelvic exam showed endometritis Patient have a colonoscopy tomorrow  Objective: Filed Vitals:   12/19/11 1447 12/19/11 2249 12/20/11 0451 12/20/11 0500  BP: 114/83 108/81 118/84   Pulse: 110 110 114   Temp: 97.8 F (36.6 C) 98.7 F (37.1 C) 98.8 F (37.1 C)   TempSrc: Oral Oral Oral   Resp: 18 18 20    Height:      Weight:    44.2 kg (97 lb 7.1 oz)  SpO2: 96% 98% 96%     Intake/Output Summary (Last 24 hours) at 12/20/11 1157 Last data filed at 12/20/11 1100  Gross per 24 hour  Intake   1461 ml  Output    300 ml  Net   1161 ml    Exam:  HENT:  Head: Atraumatic.  Nose: Nose normal.  Mouth/Throat: Oropharynx is clear and moist.  Eyes: Conjunctivae are normal. Pupils are equal, round, and reactive to light. No scleral icterus.  Neck: Neck supple. No tracheal deviation  present.  Cardiovascular: Normal rate, regular rhythm, normal heart sounds and intact distal pulses.  Pulmonary/Chest: Effort normal and breath sounds normal. No respiratory distress.  Abdominal: Soft. Normal appearance and bowel sounds are normal. She exhibits no distension. There is no tenderness.  Musculoskeletal: She exhibits no edema and no tenderness.  Neurological: She is alert. No cranial nerve deficit.    Data Reviewed: Basic Metabolic Panel:  Lab 12/20/11 9562 12/17/11 0555 12/16/11 2144  NA 137 138 136  K 3.0* 3.5 4.0  CL 103 102 96  CO2 22 27 28   GLUCOSE 109* 99 117*  BUN 4* 26* 38*  CREATININE 0.61 0.94 1.29*  CALCIUM 8.7 9.4 10.3  MG -- -- --  PHOS -- -- --    Liver Function Tests:  Lab 12/20/11 0457 12/17/11 0555  AST 11 11  ALT 9 10  ALKPHOS 51 56  BILITOT 0.4 0.5  PROT 5.7* 6.2  ALBUMIN 2.9* 3.4*   No results found for this basename: LIPASE:5,AMYLASE:5 in the last 168 hours No results found for this basename: AMMONIA:5 in the last 168 hours  CBC:  Lab 12/18/11 1530 12/18/11 0540 12/17/11 1220 12/17/11 0555 12/16/11 2144  WBC 7.0 6.4 7.0 7.1 9.5  NEUTROABS -- -- -- 5.4 7.6  HGB 13.7 13.8 13.2 13.2 14.3  HCT 38.9 39.9 38.3 38.1 40.0  MCV 82.9 84.4 84.9 84.3 83.3  PLT 257 253 240 215 250    Cardiac Enzymes:  Lab 12/18/11 1150 12/18/11 0540 12/17/11 2359 12/17/11 1800 12/17/11 1220  CKTOTAL 56 42 39 41 57  CKMB 2.5 2.1 1.9 2.0 2.2  CKMBINDEX -- -- -- -- --  TROPONINI <0.30 <0.30 <0.30 <0.30 <0.30   BNP (last 3 results)  Basename 12/06/11 2127  PROBNP 127.4     CBG: No results found for this basename: GLUCAP:5 in the last 168 hours  No results found for this or any previous visit (from the past 240 hour(s)).   Studies: Dg Chest 2 View  12/06/2011  *RADIOLOGY REPORT*  Clinical Data: Weakness and fever  CHEST - 2 VIEW  Comparison: None.  Findings: Cardiac silhouette is mildly enlarged.  The aorta is ectatic.  Lungs are hyperinflated.  No  effusion, infiltrate, or pneumothorax.  IMPRESSION:  1.  Emphysematous change. 2.  No acute cardiopulmonary findings.  Original Report Authenticated By: Genevive Bi, M.D.   Dg Lumbar Spine Complete  12/06/2011  *RADIOLOGY REPORT*  Clinical Data: Low back pain.  No known injury.  LUMBAR SPINE - COMPLETE 4+ VIEW  Comparison: None.  Findings: Advanced disc space narrowing L5-S1. Slight endplate concavity superior L4 without frank compression fracture.  Mild to moderate disc space narrowing L1-2 and L2-3.  Anatomic alignment. Advanced lower lumbar facet arthropathy.  Moderate stool burden. No osseous destructive lesion.  IMPRESSION: Degenerative changes as described.  No acute compression fracture or subluxation.  Original Report Authenticated By: Elsie Stain, M.D.   Ct Angio Chest W/cm &/or Wo Cm  12/08/2011  *RADIOLOGY REPORT*  Clinical Data: Elevated D-dimer, substernal chest pain  CT ANGIOGRAPHY CHEST  Technique:  Multidetector CT imaging of the chest using the standard protocol during bolus administration of intravenous contrast. Multiplanar reconstructed images including MIPs were obtained and reviewed to evaluate the vascular anatomy.  Contrast: 50mL OMNIPAQUE IOHEXOL 300 MG/ML  SOLN  Comparison: None  Findings: No filling defects in the pulmonary to suggest acute pulmonary embolism. There is a poor opacification of the lower lobe pulmonary arteries due to timing of the contrast bolus.  There is mild atelectasis at the lung bases.  No consolidation or pneumothorax.  Airways are normal.  Limited view of the upper abdomen is unremarkable.  Limited view of the skeleton is unremarkable.  No acute findings of the aorta great vessels.  No pericardial fluid.  IMPRESSION:  1.  No evidence of acute pulmonary embolism.  Evaluation of the lower lobe segmental arteries is somewhat limited due to bolus timing. 2. Bibasilar atelectasis.  Original Report Authenticated By: Genevive Bi, M.D.   US Transvaginal  Non-ob  12/19/2011  *RADIOLOGY REPORT*  Clinical Data: Uterine mass  TRANSABDOMINAL AND TRANSVAGINAL ULTRASOUND OF PELVIS Technique:  Both transabdominal and transvaginal ultrasound examinations of the pelvis were performed. Transabdominal technique was performed for global imaging of the pelvis including uterus, ovaries, adnexal regions, and pelvic cul-de-sac.  It was necessary to proceed with endovaginal exam following the transabdominal exam to visualize the endometrium and ovaries, not seen transabdominally.  Comparison:  CT 12/18/2011  Findings:  Uterus: Not well visualized transvaginally due to bowel gas.  6.3 x 3.2 x 2.6 cm.  No focal abnormality.  Endometrium: 2 mm.  Fluid noted within the endometrial canal.  No focal endometrial abnormality.  Right ovary:  Not visualized.  No adnexal mass.  Left ovary: Not visualized.  No adnexal mass.  Other findings: No free fluid  IMPRESSION: Fluid within the endometrial canal but no focal endometrial thickening.  This may be asymptomatic or could be due to cervical stenosis.  In the absence of vaginal bleeding, this likely requires no further specific imaging  follow-up.  Original Report Authenticated By: Harrel Lemon, M.D.   US Pelvis Complete  12/19/2011  *RADIOLOGY REPORT*  Clinical Data: Uterine mass  TRANSABDOMINAL AND TRANSVAGINAL ULTRASOUND OF PELVIS Technique:  Both transabdominal and transvaginal ultrasound examinations of the pelvis were performed. Transabdominal technique was performed for global imaging of the pelvis including uterus, ovaries, adnexal regions, and pelvic cul-de-sac.  It was necessary to proceed with endovaginal exam following the transabdominal exam to visualize the endometrium and ovaries, not seen transabdominally.  Comparison:  CT 12/18/2011  Findings:  Uterus: Not well visualized transvaginally due to bowel gas.  6.3 x 3.2 x 2.6 cm.  No focal abnormality.  Endometrium: 2 mm.  Fluid noted within the endometrial canal.  No focal  endometrial abnormality.  Right ovary:  Not visualized.  No adnexal mass.  Left ovary: Not visualized.  No adnexal mass.  Other findings: No free fluid  IMPRESSION: Fluid within the endometrial canal but no focal endometrial thickening.  This may be asymptomatic or could be due to cervical stenosis.  In the absence of vaginal bleeding, this likely requires no further specific imaging follow-up.  Original Report Authenticated By: Harrel Lemon, M.D.   Ct Abdomen Pelvis W Contrast  12/18/2011  *RADIOLOGY REPORT*  Clinical Data: Hematuria.  Abdominal pain.  Rectal bleeding.  CT ABDOMEN AND PELVIS WITH CONTRAST  Technique:  Multidetector CT imaging of the abdomen and pelvis was performed following the standard protocol during bolus administration of intravenous contrast.  Contrast: 80mL OMNIPAQUE IOHEXOL 300 MG/ML  SOLN  Comparison: No priors.  Findings:  Lung Bases: Trace left-sided pleural effusion layering dependently. Slight prominence of the interstitial markings in the lung bases bilaterally is nonspecific.  Abdomen/Pelvis:  Study is slightly limited by gross patient motion. These limitations in mind, the appearance of the gallbladder, pancreas and bilateral adrenal glands is unremarkable.  There is a focal area of decreased enhancement adjacent to the falciform ligament of the liver, likely to represent a benign perfusion anomaly.  The remainder of the liver is otherwise unremarkable in appearance.  A subcentimeter low attenuation lesion in the spleen is too small to characterize, but is statistically benign.  There are several low attenuation lesions in the kidneys bilaterally, most of which are too small to definitively characterize, but are statistically favored to represent small cysts.  The largest renal lesion measures 1 cm and the interpolar region of the right kidney, and is compatible with a cyst.  Large volume of well formed stool in the rectum could suggest constipation.  Numerous colonic  diverticula, without definite surrounding inflammatory changes to strongly suggest an acute diverticulitis.  However, there is a trace volume of fluid in the left pericolic gutter.  No pneumoperitoneum.  No pathologic distension of small bowel.  Uterus and ovaries are atrophic. Urinary bladder is unremarkable in appearance.  Musculoskeletal: There are no aggressive appearing lytic or blastic lesions noted in the visualized portions of the skeleton.  IMPRESSION: 1.  Trace volume of ascites in the left pericolic gutter.  The cause of this is uncertain. 2.  Large volume of stool in the rectal vault and throughout the more proximal colon suggestive of constipation. 3.  Numerous colonic diverticula, without definite surrounding inflammatory changes to strongly suggest the presence of acute diverticulitis.  Given the small amount of fluid in the left pericolic gutter, however, clinical correlation for signs and symptoms of diverticulitis may be warranted.  4.  Numerous low attenuation renal lesions bilaterally, that the majority of  which are too small to definitively characterize. These are favored to represent cysts. However, in this patient with history of hematuria, if there is strong clinical concern for small neoplasm, this could be further evaluated with renal MRI if clinically indicated. 5.  Trace left-sided pleural effusion layering dependently.  Original Report Authenticated By: Florencia Reasons, M.D.    Scheduled Meds:   . amLODipine  10 mg Oral Daily  . lactulose  30 g Oral TID  . loratadine  10 mg Oral Daily  . metoprolol succinate  12.5 mg Oral BID  . pantoprazole  40 mg Oral Q1200  . polyethylene glycol  17 g Oral Daily  . polyethylene glycol-electrolytes  4,000 mL Oral Once  . potassium chloride  30 mEq Oral BID  . sodium chloride  3 mL Intravenous Q12H  . DISCONTD: lactulose  30 g Oral BID   Continuous Infusions:   . sodium chloride 75 mL/hr at 12/20/11 0600    Principal Problem:   *Rectal bleeding Active Problems:  Diarrhea  Post-menopausal bleeding    Time spent: 40 minutes   [facility]  [facility] Pager [phone]. If 8PM-8AM, please contact night-coverage at [email or web address], password [facility] 12/20/2011, 11:57 AM  LOS: 4 days

## 2011-12-20 NOTE — Progress Notes (Signed)
[facility] Progress Note  Subjective: No report of bleeding overnight. Had a bowel movement today  Objective: Vital signs in last 24 hours: Temp:  [97.8 F (36.6 C)-98.8 F (37.1 C)] 98.8 F (37.1 C) (07/23 0451) Pulse Rate:  [110-114] 114  (07/23 0451) Resp:  [18-20] 20  (07/23 0451) BP: (108-118)/(81-84) 118/84 mmHg (07/23 0451) SpO2:  [96 %-98 %] 96 % (07/23 0451) Weight:  [44.2 kg (97 lb 7.1 oz)] 44.2 kg (97 lb 7.1 oz) (07/23 0500) Weight change: 1.8 kg (3 lb 15.5 oz)   PE Abdomen soft Lab Results: Results for orders placed during the hospital encounter of 12/16/11 (from the past 24 hour(s))  COMPREHENSIVE METABOLIC PANEL     Status: Abnormal   Collection Time   12/20/11  4:57 AM      Component Value Range   Sodium 137  135 - 145 mEq/L   Potassium 3.0 (*) 3.5 - 5.1 mEq/L   Chloride 103  96 - 112 mEq/L   CO2 22  19 - 32 mEq/L   Glucose, Bld 109 (*) 70 - 99 mg/dL   BUN 4 (*) 6 - 23 mg/dL   Creatinine, Ser 1.61  0.50 - 1.10 mg/dL   Calcium 8.7  8.4 - 09.6 mg/dL   Total Protein 5.7 (*) 6.0 - 8.3 g/dL   Albumin 2.9 (*) 3.5 - 5.2 g/dL   AST 11  0 - 37 U/L   ALT 9  0 - 35 U/L   Alkaline Phosphatase 51  39 - 117 U/L   Total Bilirubin 0.4  0.3 - 1.2 mg/dL   GFR calc non Af Amer 83 (*) >90 mL/min   GFR calc Af Amer >90  >90 mL/min  OCCULT BLOOD X 1 CARD TO LAB, STOOL     Status: Normal   Collection Time   12/20/11  9:07 AM      Component Value Range   Fecal Occult Bld POSITIVE      Studies/Results: @RISRSLT24 @    Assessment: Rectal bleeding, heme positive stool.  Plan: Colonoscopy    [name] 12/20/2011, 10:38 AM  Lab Results  Component Value Date   HGB 13.7 12/18/2011   HGB 13.8 12/18/2011   HGB 13.2 12/17/2011   HCT 38.9 12/18/2011   HCT 39.9 12/18/2011   HCT 38.3 12/17/2011   ALKPHOS 51 12/20/2011   ALKPHOS 56 12/17/2011   AST 11 12/20/2011   AST 11 12/17/2011   ALT 9 12/20/2011   ALT 10 12/17/2011

## 2011-12-21 ENCOUNTER — Encounter (HOSPITAL_COMMUNITY)
Admission: EM | Disposition: A | Discharge status: Home / Self Care | Payer: Self-pay | Source: Home / Self Care | Attending: Internal Medicine

## 2011-12-21 ENCOUNTER — Encounter (HOSPITAL_COMMUNITY): Payer: Self-pay

## 2011-12-21 DIAGNOSIS — R197 Diarrhea, unspecified: Secondary | ICD-10-CM

## 2011-12-21 DIAGNOSIS — N950 Postmenopausal bleeding: Secondary | ICD-10-CM

## 2011-12-21 DIAGNOSIS — I10 Essential (primary) hypertension: Secondary | ICD-10-CM

## 2011-12-21 HISTORY — PX: COLONOSCOPY: SHX5424

## 2011-12-21 LAB — BASIC METABOLIC PANEL
CO2: 17 mEq/L — ABNORMAL LOW (ref 19–32)
Calcium: 8.7 mg/dL (ref 8.4–10.5)
Glucose, Bld: 121 mg/dL — ABNORMAL HIGH (ref 70–99)
Potassium: 3.8 mEq/L (ref 3.5–5.1)
Sodium: 137 mEq/L (ref 135–145)

## 2011-12-21 LAB — CBC
Hemoglobin: 12.9 g/dL (ref 12.0–15.0)
MCH: 29.2 pg (ref 26.0–34.0)
RBC: 4.42 MIL/uL (ref 3.87–5.11)

## 2011-12-21 SURGERY — COLONOSCOPY
Anesthesia: Moderate Sedation

## 2011-12-21 MED ORDER — MIDAZOLAM HCL 10 MG/2ML IJ SOLN
INTRAMUSCULAR | Status: AC
  Filled 2011-12-21: qty 2

## 2011-12-21 MED ORDER — MIDAZOLAM HCL 5 MG/5ML IJ SOLN
INTRAMUSCULAR | Status: DC | PRN
  Administered 2011-12-21: 1 mg via INTRAVENOUS
  Administered 2011-12-21: 2 mg via INTRAVENOUS

## 2011-12-21 MED ORDER — FENTANYL CITRATE 0.05 MG/ML IJ SOLN
INTRAMUSCULAR | Status: DC | PRN
  Administered 2011-12-21: 25 ug via INTRAVENOUS

## 2011-12-21 MED ORDER — HYDROCORTISONE ACETATE 25 MG RE SUPP
25.0000 mg | Freq: Two times a day (BID) | RECTAL | Status: DC
  Administered 2011-12-21 – 2011-12-22 (×2): 25 mg via RECTAL
  Filled 2011-12-21 (×5): qty 1

## 2011-12-21 MED ORDER — FENTANYL CITRATE 0.05 MG/ML IJ SOLN
INTRAMUSCULAR | Status: AC
  Filled 2011-12-21: qty 2

## 2011-12-21 NOTE — Progress Notes (Signed)
Patient arrived from colonoscopy. Patient in stable condition. No needs at this time. Erskin Burnet RN.

## 2011-12-21 NOTE — Op Note (Signed)
[facility] [address] [place], [place]  [address]  COLONOSCOPY PROCEDURE REPORT  PATIENT:  Becky Leach, Becky Leach  MR#:  [id number] BIRTHDATE:  1930-04-17, 82 yrs. old  GENDER:  female ENDOSCOPIST:  Wandalee Ferdinand, MD REF. BY: PROCEDURE DATE:  12/21/2011 PROCEDURE: Colonoscopy with polypectomy and biopsy ASA CLASS: 2 INDICATIONS: Rectal bleeding MEDICATIONS: Fentanyl 25 mcg IV, Versed 3 mg IV DESCRIPTION OF PROCEDURE:   After the risks benefits and alternatives of the procedure were thoroughly explained, informed consent was obtained. Inspection of the perianal area revealed 2 ulcerated areas of skin on each side of the anal area. Digital rectal examination did not reveal any masses.  The Pentax Ped Colon [id number] endoscope was introduced through the anus and advanced to the cecum , without limitations.  The quality of the prep was good.  The instrument was then slowly withdrawn as the colon was fully examined. <<PROCEDUREIMAGES>>  FINDINGS: In the cecum there were some linear ulcers as well as an ulcer on the area of the ileocecal valve, and biopsies were obtained for histological inspection. In the descending colon there was a linear ulcer also as noted on image 1. The sigmoid and descending colon revealed extensive diverticulosis. In the distal descending colon there was a 1 cm sessile polyp that was snared and removed by snare cautery technique. There were large internal hemorrhoids noted on retroflexion.  COMPLICATIONS: None  IMPRESSION: See above  RECOMMENDATIONS: Check pathology. Treat hemorrhoids with suppositories. Consider surgical consult for hemorrhoids if medical therapy does not help  ______________________________ Wandalee Ferdinand, MD  CC:  n. eSIGNED:   Sam Jaxin Fulfer at 12/21/2011 11:21 AM  Becky Leach, [id number]

## 2011-12-21 NOTE — Progress Notes (Signed)
[facility] PROGRESS NOTE  Becky Leach NWG:[id number] DOB: [birth year] DOA: 12/16/2011 PCP: Quitman Livings, MD  Assessment/Plan: Principal Problem:  *Rectal bleeding Active Problems:  Diarrhea  Post-menopausal bleeding  Diarrhea:  -Resolved, stool studies discontinued, IV Flagyl discontinued 7/22 Rectal bleeding   -S/p colonoscopy today with some linear ulcer in the cecum descending colon and ileocecal valve - biopsied. Diverticulosis in the sigmoid, and descending colon and also a sessile polyp in the descending colon  hemoglobin has remained stable. Large internal hemorrhoids noted -Annusol HC suppositories started as per GI recommendations, follow GI consulted, and gynecology consulted, Tachycardia:  -Improved with hydration, cycle cardiac markers negative. Dehydration:  -imProved with hydration  Hypertension:  -cont amlodipine, cont hydrochlorothiazide  Endometritis -treating endometritis empirically with Doxycycline 100 mg po BID x 10d   Code Status: full code Family Communication: son via phone at [phone] Disposition Plan:  As above   Brief Narrative 78 year old female presented with bleeding from her pelvis, questionable sources vagina versus rectal Straight cath urine analysis did not show any RBC of gross hematuria therefore I doubt that she has hematuria She does not need any further urologic workup Pelvic exam showed endometritis  HPI/Subjective: S/p Colonoscopy, patient denies abdominal pain denies any further rectal bleeding. Tolerating by mouth as well.  Objective: Filed Vitals:   12/21/11 1130 12/21/11 1140 12/21/11 1150 12/21/11 1252  BP: 154/75 125/83 125/83 109/79  Pulse:    98  Temp:    97.7 F (36.5 C)  TempSrc:    Oral  Resp: 17 8 15 16   Height:      Weight:      SpO2: 98% 96% 98%     Intake/Output Summary (Last 24 hours) at 12/21/11 1333 Last data filed at 12/20/11 2129  Gross per 24 hour  Intake    900 ml  Output    175 ml  Net     725 ml    Exam:  In General:  elderly black female in no apparent distress.  Neck: Neck supple. No tracheal deviation present.  Cardiovascular: Normal rate, regular rhythm, normal heart sounds and intact distal pulses.  Pulmonary/Chest: Effort normal and breath sounds normal. No respiratory distress.  Abdominal: Soft. bowel sounds are normal. She exhibits no distension. There is no tenderness.  Musculoskeletal: She exhibits no edema and no tenderness.  Neurological: She is alert. No cranial nerve deficit.    Data Reviewed: Basic Metabolic Panel:  Lab 12/20/11 5784 12/17/11 0555 12/16/11 2144  NA 137 138 136  K 3.0* 3.5 4.0  CL 103 102 96  CO2 22 27 28   GLUCOSE 109* 99 117*  BUN 4* 26* 38*  CREATININE 0.61 0.94 1.29*  CALCIUM 8.7 9.4 10.3  MG -- -- --  PHOS -- -- --    Liver Function Tests:  Lab 12/20/11 0457 12/17/11 0555  AST 11 11  ALT 9 10  ALKPHOS 51 56  BILITOT 0.4 0.5  PROT 5.7* 6.2  ALBUMIN 2.9* 3.4*   No results found for this basename: LIPASE:5,AMYLASE:5 in the last 168 hours No results found for this basename: AMMONIA:5 in the last 168 hours  CBC:  Lab 12/21/11 0446 12/18/11 1530 12/18/11 0540 12/17/11 1220 12/17/11 0555 12/16/11 2144  WBC 8.6 7.0 6.4 7.0 7.1 --  NEUTROABS -- -- -- -- 5.4 7.6  HGB 12.9 13.7 13.8 13.2 13.2 --  HCT 36.9 38.9 39.9 38.3 38.1 --  MCV 83.5 82.9 84.4 84.9 84.3 --  PLT 212 257 253 240 215 --  Cardiac Enzymes:  Lab 12/18/11 1150 12/18/11 0540 12/17/11 2359 12/17/11 1800 12/17/11 1220  CKTOTAL 56 42 39 41 57  CKMB 2.5 2.1 1.9 2.0 2.2  CKMBINDEX -- -- -- -- --  TROPONINI <0.30 <0.30 <0.30 <0.30 <0.30   BNP (last 3 results)  Basename 12/06/11 2127  PROBNP 127.4     CBG: No results found for this basename: GLUCAP:5 in the last 168 hours  No results found for this or any previous visit (from the past 240 hour(s)).   Studies: Dg Chest 2 View  12/06/2011  *RADIOLOGY REPORT*  Clinical Data: Weakness and fever   CHEST - 2 VIEW  Comparison: None.  Findings: Cardiac silhouette is mildly enlarged.  The aorta is ectatic.  Lungs are hyperinflated.  No effusion, infiltrate, or pneumothorax.  IMPRESSION:  1.  Emphysematous change. 2.  No acute cardiopulmonary findings.  Original Report Authenticated By: Genevive Bi, M.D.   Dg Lumbar Spine Complete  12/06/2011  *RADIOLOGY REPORT*  Clinical Data: Low back pain.  No known injury.  LUMBAR SPINE - COMPLETE 4+ VIEW  Comparison: None.  Findings: Advanced disc space narrowing L5-S1. Slight endplate concavity superior L4 without frank compression fracture.  Mild to moderate disc space narrowing L1-2 and L2-3.  Anatomic alignment. Advanced lower lumbar facet arthropathy.  Moderate stool burden. No osseous destructive lesion.  IMPRESSION: Degenerative changes as described.  No acute compression fracture or subluxation.  Original Report Authenticated By: Elsie Stain, M.D.   Ct Angio Chest W/cm &/or Wo Cm  12/08/2011  *RADIOLOGY REPORT*  Clinical Data: Elevated D-dimer, substernal chest pain  CT ANGIOGRAPHY CHEST  Technique:  Multidetector CT imaging of the chest using the standard protocol during bolus administration of intravenous contrast. Multiplanar reconstructed images including MIPs were obtained and reviewed to evaluate the vascular anatomy.  Contrast: 50mL OMNIPAQUE IOHEXOL 300 MG/ML  SOLN  Comparison: None  Findings: No filling defects in the pulmonary to suggest acute pulmonary embolism. There is a poor opacification of the lower lobe pulmonary arteries due to timing of the contrast bolus.  There is mild atelectasis at the lung bases.  No consolidation or pneumothorax.  Airways are normal.  Limited view of the upper abdomen is unremarkable.  Limited view of the skeleton is unremarkable.  No acute findings of the aorta great vessels.  No pericardial fluid.  IMPRESSION:  1.  No evidence of acute pulmonary embolism.  Evaluation of the lower lobe segmental arteries is  somewhat limited due to bolus timing. 2. Bibasilar atelectasis.  Original Report Authenticated By: Genevive Bi, M.D.   US Transvaginal Non-ob  12/19/2011  *RADIOLOGY REPORT*  Clinical Data: Uterine mass  TRANSABDOMINAL AND TRANSVAGINAL ULTRASOUND OF PELVIS Technique:  Both transabdominal and transvaginal ultrasound examinations of the pelvis were performed. Transabdominal technique was performed for global imaging of the pelvis including uterus, ovaries, adnexal regions, and pelvic cul-de-sac.  It was necessary to proceed with endovaginal exam following the transabdominal exam to visualize the endometrium and ovaries, not seen transabdominally.  Comparison:  CT 12/18/2011  Findings:  Uterus: Not well visualized transvaginally due to bowel gas.  6.3 x 3.2 x 2.6 cm.  No focal abnormality.  Endometrium: 2 mm.  Fluid noted within the endometrial canal.  No focal endometrial abnormality.  Right ovary:  Not visualized.  No adnexal mass.  Left ovary: Not visualized.  No adnexal mass.  Other findings: No free fluid  IMPRESSION: Fluid within the endometrial canal but no focal endometrial thickening.  This may be  asymptomatic or could be due to cervical stenosis.  In the absence of vaginal bleeding, this likely requires no further specific imaging follow-up.  Original Report Authenticated By: Harrel Lemon, M.D.   US Pelvis Complete  12/19/2011  *RADIOLOGY REPORT*  Clinical Data: Uterine mass  TRANSABDOMINAL AND TRANSVAGINAL ULTRASOUND OF PELVIS Technique:  Both transabdominal and transvaginal ultrasound examinations of the pelvis were performed. Transabdominal technique was performed for global imaging of the pelvis including uterus, ovaries, adnexal regions, and pelvic cul-de-sac.  It was necessary to proceed with endovaginal exam following the transabdominal exam to visualize the endometrium and ovaries, not seen transabdominally.  Comparison:  CT 12/18/2011  Findings:  Uterus: Not well visualized  transvaginally due to bowel gas.  6.3 x 3.2 x 2.6 cm.  No focal abnormality.  Endometrium: 2 mm.  Fluid noted within the endometrial canal.  No focal endometrial abnormality.  Right ovary:  Not visualized.  No adnexal mass.  Left ovary: Not visualized.  No adnexal mass.  Other findings: No free fluid  IMPRESSION: Fluid within the endometrial canal but no focal endometrial thickening.  This may be asymptomatic or could be due to cervical stenosis.  In the absence of vaginal bleeding, this likely requires no further specific imaging follow-up.  Original Report Authenticated By: Harrel Lemon, M.D.   Ct Abdomen Pelvis W Contrast  12/18/2011  *RADIOLOGY REPORT*  Clinical Data: Hematuria.  Abdominal pain.  Rectal bleeding.  CT ABDOMEN AND PELVIS WITH CONTRAST  Technique:  Multidetector CT imaging of the abdomen and pelvis was performed following the standard protocol during bolus administration of intravenous contrast.  Contrast: 80mL OMNIPAQUE IOHEXOL 300 MG/ML  SOLN  Comparison: No priors.  Findings:  Lung Bases: Trace left-sided pleural effusion layering dependently. Slight prominence of the interstitial markings in the lung bases bilaterally is nonspecific.  Abdomen/Pelvis:  Study is slightly limited by gross patient motion. These limitations in mind, the appearance of the gallbladder, pancreas and bilateral adrenal glands is unremarkable.  There is a focal area of decreased enhancement adjacent to the falciform ligament of the liver, likely to represent a benign perfusion anomaly.  The remainder of the liver is otherwise unremarkable in appearance.  A subcentimeter low attenuation lesion in the spleen is too small to characterize, but is statistically benign.  There are several low attenuation lesions in the kidneys bilaterally, most of which are too small to definitively characterize, but are statistically favored to represent small cysts.  The largest renal lesion measures 1 cm and the interpolar region of  the right kidney, and is compatible with a cyst.  Large volume of well formed stool in the rectum could suggest constipation.  Numerous colonic diverticula, without definite surrounding inflammatory changes to strongly suggest an acute diverticulitis.  However, there is a trace volume of fluid in the left pericolic gutter.  No pneumoperitoneum.  No pathologic distension of small bowel.  Uterus and ovaries are atrophic. Urinary bladder is unremarkable in appearance.  Musculoskeletal: There are no aggressive appearing lytic or blastic lesions noted in the visualized portions of the skeleton.  IMPRESSION: 1.  Trace volume of ascites in the left pericolic gutter.  The cause of this is uncertain. 2.  Large volume of stool in the rectal vault and throughout the more proximal colon suggestive of constipation. 3.  Numerous colonic diverticula, without definite surrounding inflammatory changes to strongly suggest the presence of acute diverticulitis.  Given the small amount of fluid in the left pericolic gutter, however, clinical correlation  for signs and symptoms of diverticulitis may be warranted.  4.  Numerous low attenuation renal lesions bilaterally, that the majority of which are too small to definitively characterize. These are favored to represent cysts. However, in this patient with history of hematuria, if there is strong clinical concern for small neoplasm, this could be further evaluated with renal MRI if clinically indicated. 5.  Trace left-sided pleural effusion layering dependently.  Original Report Authenticated By: Florencia Reasons, M.D.    Scheduled Meds:    . amLODipine  10 mg Oral Daily  . doxycycline  100 mg Oral Q12H  . lactulose  30 g Oral TID  . loratadine  10 mg Oral Daily  . metoprolol succinate  12.5 mg Oral BID  . pantoprazole  40 mg Oral Q1200  . polyethylene glycol  17 g Oral Daily  . polyethylene glycol-electrolytes  4,000 mL Oral Once  . potassium chloride  30 mEq Oral BID  .  sodium chloride  3 mL Intravenous Q12H   Continuous Infusions:    . sodium chloride 75 mL/hr at 12/21/11 1610    Principal Problem:  *Rectal bleeding Active Problems:  Diarrhea  Post-menopausal bleeding    Time spent: 40 minutes   [facility] C  [facility] Pager [phone]. If 8PM-8AM, please contact night-coverage at [email or web address], password [email or web address] 12/21/2011, 1:33 PM  LOS: 5 days

## 2011-12-22 ENCOUNTER — Encounter (HOSPITAL_COMMUNITY): Payer: Self-pay

## 2011-12-22 ENCOUNTER — Encounter (HOSPITAL_COMMUNITY): Payer: Self-pay | Admitting: Gastroenterology

## 2011-12-22 LAB — CBC
Hemoglobin: 11.4 g/dL — ABNORMAL LOW (ref 12.0–15.0)
MCH: 29.2 pg (ref 26.0–34.0)
MCH: 30 pg (ref 26.0–34.0)
MCV: 84.1 fL (ref 78.0–100.0)
MCV: 84.2 fL (ref 78.0–100.0)
Platelets: 206 10*3/uL (ref 150–400)
RBC: 3.91 MIL/uL (ref 3.87–5.11)
RBC: 4.44 MIL/uL (ref 3.87–5.11)

## 2011-12-22 LAB — BASIC METABOLIC PANEL
BUN: 11 mg/dL (ref 6–23)
Chloride: 108 mEq/L (ref 96–112)
Glucose, Bld: 102 mg/dL — ABNORMAL HIGH (ref 70–99)
Potassium: 3.4 mEq/L — ABNORMAL LOW (ref 3.5–5.1)

## 2011-12-22 MED ORDER — POTASSIUM CHLORIDE CRYS ER 20 MEQ PO TBCR
40.0000 meq | EXTENDED_RELEASE_TABLET | Freq: Once | ORAL | Status: AC
  Administered 2011-12-22: 40 meq via ORAL
  Filled 2011-12-22: qty 2

## 2011-12-22 NOTE — Progress Notes (Signed)
[facility] PROGRESS NOTE  Becky Leach:[id number] DOB: 22-May-1930 DOA: 12/16/2011 PCP: Quitman Livings, MD  Assessment/Plan: Principal Problem:  *Rectal bleeding Active Problems:  Diarrhea  Post-menopausal bleeding  Diarrhea:  -Resolved, stool studies discontinued, IV Flagyl discontinued 7/22 Rectal bleeding   -S/p colonoscopy today with some linear ulcer in the cecum descending colon and ileocecal valve - biopsied - results pending. Diverticulosis in the sigmoid, and descending colon and also a sessile polyp in the descending colon  hemoglobin has remained stable. Large internal hemorrhoids noted -continueAnnusol HC suppositories as per GI recommendations-pt now agrees to have it -follow and if hgb remains stable plan d/c in am -consult PT for recs  Tachycardia:  -resolved with hydration, cardiac markers negative. Dehydration:  -resolved with hydration, will d/c ivf  Hypertension:  -cont amlodipine and metoprolol Endometritis -treating endometritis empirically with Doxycycline 100 mg po BID x 10d Hypokalemia -replace k   Consults -GI -GYN  Code Status: full code Family Communication: updated 2sons at bedside(son's phone at [phone]) Disposition Plan:  Home if hgb remaining stable  Brief Narrative 78 year old female presented with bleeding from her pelvis, questionable sources vagina versus rectal Straight cath urine analysis did not show any RBC of gross hematuria therefore I doubt that she has hematuria She does not need any further urologic workup Pelvic exam showed endometritis  HPI/Subjective: Doing well today - no rectal bleeding,no BM. Per nursing staff she refused annusol suppository this am, but now agrees to take it. denies any c/o. Her 2 sons are at bedside.  Objective: Filed Vitals:   12/21/11 1411 12/21/11 2150 12/22/11 0438 12/22/11 1243  BP: 110/68 131/69 110/72 104/67  Pulse: 104 114 78 96  Temp: 97.9 F (36.6 C) 98 F (36.7 C) 98.5 F (36.9  C) 98.5 F (36.9 C)  TempSrc: Oral Oral Oral Oral  Resp: 16 18 16 18   Height:      Weight:   45.768 kg (100 lb 14.4 oz)   SpO2: 100% 94% 97% 97%    Intake/Output Summary (Last 24 hours) at 12/22/11 1753 Last data filed at 12/22/11 1447  Gross per 24 hour  Intake    900 ml  Output   1200 ml  Net   -300 ml    Exam:  In General:  elderly black female in no apparent distress.  Neck: Neck supple. No tracheal deviation present.  Cardiovascular: Normal rate, regular rhythm, normal heart sounds and intact distal pulses.  Pulmonary/Chest: Effort normal and breath sounds normal. No respiratory distress.  Abdominal: Soft. bowel sounds are normal. She exhibits no distension. There is no tenderness.  Musculoskeletal: She exhibits no edema and no tenderness.  Neurological: She is alert. No cranial nerve deficit.    Data Reviewed: Basic Metabolic Panel:  Lab 12/22/11 8657 12/21/11 1420 12/20/11 0457 12/17/11 0555 12/16/11 2144  NA 139 137 137 138 136  K 3.4* 3.8 3.0* 3.5 4.0  CL 108 106 103 102 96  CO2 24 17* 22 27 28   GLUCOSE 102* 121* 109* 99 117*  BUN 11 4* 4* 26* 38*  CREATININE 0.70 0.51 0.61 0.94 1.29*  CALCIUM 8.3* 8.7 8.7 9.4 10.3  MG -- -- -- -- --  PHOS -- -- -- -- --    Liver Function Tests:  Lab 12/20/11 0457 12/17/11 0555  AST 11 11  ALT 9 10  ALKPHOS 51 56  BILITOT 0.4 0.5  PROT 5.7* 6.2  ALBUMIN 2.9* 3.4*   No results found for this basename: LIPASE:5,AMYLASE:5 in  the last 168 hours No results found for this basename: AMMONIA:5 in the last 168 hours  CBC:  Lab 12/22/11 0505 12/21/11 0446 12/18/11 1530 12/18/11 0540 12/17/11 1220 12/17/11 0555 12/16/11 2144  WBC 9.5 8.6 7.0 6.4 7.0 -- --  NEUTROABS -- -- -- -- -- 5.4 7.6  HGB 11.4* 12.9 13.7 13.8 13.2 -- --  HCT 32.9* 36.9 38.9 39.9 38.3 -- --  MCV 84.1 83.5 82.9 84.4 84.9 -- --  PLT 191 212 257 253 240 -- --    Cardiac Enzymes:  Lab 12/18/11 1150 12/18/11 0540 12/17/11 2359 12/17/11 1800 12/17/11  1220  CKTOTAL 56 42 39 41 57  CKMB 2.5 2.1 1.9 2.0 2.2  CKMBINDEX -- -- -- -- --  TROPONINI <0.30 <0.30 <0.30 <0.30 <0.30   BNP (last 3 results)  Basename 12/06/11 2127  PROBNP 127.4     CBG: No results found for this basename: GLUCAP:5 in the last 168 hours  No results found for this or any previous visit (from the past 240 hour(s)).   Studies: Dg Chest 2 View  12/06/2011  *RADIOLOGY REPORT*  Clinical Data: Weakness and fever  CHEST - 2 VIEW  Comparison: None.  Findings: Cardiac silhouette is mildly enlarged.  The aorta is ectatic.  Lungs are hyperinflated.  No effusion, infiltrate, or pneumothorax.  IMPRESSION:  1.  Emphysematous change. 2.  No acute cardiopulmonary findings.  Original Report Authenticated By: Genevive Bi, M.D.   Dg Lumbar Spine Complete  12/06/2011  *RADIOLOGY REPORT*  Clinical Data: Low back pain.  No known injury.  LUMBAR SPINE - COMPLETE 4+ VIEW  Comparison: None.  Findings: Advanced disc space narrowing L5-S1. Slight endplate concavity superior L4 without frank compression fracture.  Mild to moderate disc space narrowing L1-2 and L2-3.  Anatomic alignment. Advanced lower lumbar facet arthropathy.  Moderate stool burden. No osseous destructive lesion.  IMPRESSION: Degenerative changes as described.  No acute compression fracture or subluxation.  Original Report Authenticated By: Elsie Stain, M.D.   Ct Angio Chest W/cm &/or Wo Cm  12/08/2011  *RADIOLOGY REPORT*  Clinical Data: Elevated D-dimer, substernal chest pain  CT ANGIOGRAPHY CHEST  Technique:  Multidetector CT imaging of the chest using the standard protocol during bolus administration of intravenous contrast. Multiplanar reconstructed images including MIPs were obtained and reviewed to evaluate the vascular anatomy.  Contrast: 50mL OMNIPAQUE IOHEXOL 300 MG/ML  SOLN  Comparison: None  Findings: No filling defects in the pulmonary to suggest acute pulmonary embolism. There is a poor opacification of the  lower lobe pulmonary arteries due to timing of the contrast bolus.  There is mild atelectasis at the lung bases.  No consolidation or pneumothorax.  Airways are normal.  Limited view of the upper abdomen is unremarkable.  Limited view of the skeleton is unremarkable.  No acute findings of the aorta great vessels.  No pericardial fluid.  IMPRESSION:  1.  No evidence of acute pulmonary embolism.  Evaluation of the lower lobe segmental arteries is somewhat limited due to bolus timing. 2. Bibasilar atelectasis.  Original Report Authenticated By: Genevive Bi, M.D.   US Transvaginal Non-ob  12/19/2011  *RADIOLOGY REPORT*  Clinical Data: Uterine mass  TRANSABDOMINAL AND TRANSVAGINAL ULTRASOUND OF PELVIS Technique:  Both transabdominal and transvaginal ultrasound examinations of the pelvis were performed. Transabdominal technique was performed for global imaging of the pelvis including uterus, ovaries, adnexal regions, and pelvic cul-de-sac.  It was necessary to proceed with endovaginal exam following the transabdominal exam to visualize the  endometrium and ovaries, not seen transabdominally.  Comparison:  CT 12/18/2011  Findings:  Uterus: Not well visualized transvaginally due to bowel gas.  6.3 x 3.2 x 2.6 cm.  No focal abnormality.  Endometrium: 2 mm.  Fluid noted within the endometrial canal.  No focal endometrial abnormality.  Right ovary:  Not visualized.  No adnexal mass.  Left ovary: Not visualized.  No adnexal mass.  Other findings: No free fluid  IMPRESSION: Fluid within the endometrial canal but no focal endometrial thickening.  This may be asymptomatic or could be due to cervical stenosis.  In the absence of vaginal bleeding, this likely requires no further specific imaging follow-up.  Original Report Authenticated By: Harrel Lemon, M.D.   US Pelvis Complete  12/19/2011  *RADIOLOGY REPORT*  Clinical Data: Uterine mass  TRANSABDOMINAL AND TRANSVAGINAL ULTRASOUND OF PELVIS Technique:  Both  transabdominal and transvaginal ultrasound examinations of the pelvis were performed. Transabdominal technique was performed for global imaging of the pelvis including uterus, ovaries, adnexal regions, and pelvic cul-de-sac.  It was necessary to proceed with endovaginal exam following the transabdominal exam to visualize the endometrium and ovaries, not seen transabdominally.  Comparison:  CT 12/18/2011  Findings:  Uterus: Not well visualized transvaginally due to bowel gas.  6.3 x 3.2 x 2.6 cm.  No focal abnormality.  Endometrium: 2 mm.  Fluid noted within the endometrial canal.  No focal endometrial abnormality.  Right ovary:  Not visualized.  No adnexal mass.  Left ovary: Not visualized.  No adnexal mass.  Other findings: No free fluid  IMPRESSION: Fluid within the endometrial canal but no focal endometrial thickening.  This may be asymptomatic or could be due to cervical stenosis.  In the absence of vaginal bleeding, this likely requires no further specific imaging follow-up.  Original Report Authenticated By: Harrel Lemon, M.D.   Ct Abdomen Pelvis W Contrast  12/18/2011  *RADIOLOGY REPORT*  Clinical Data: Hematuria.  Abdominal pain.  Rectal bleeding.  CT ABDOMEN AND PELVIS WITH CONTRAST  Technique:  Multidetector CT imaging of the abdomen and pelvis was performed following the standard protocol during bolus administration of intravenous contrast.  Contrast: 80mL OMNIPAQUE IOHEXOL 300 MG/ML  SOLN  Comparison: No priors.  Findings:  Lung Bases: Trace left-sided pleural effusion layering dependently. Slight prominence of the interstitial markings in the lung bases bilaterally is nonspecific.  Abdomen/Pelvis:  Study is slightly limited by gross patient motion. These limitations in mind, the appearance of the gallbladder, pancreas and bilateral adrenal glands is unremarkable.  There is a focal area of decreased enhancement adjacent to the falciform ligament of the liver, likely to represent a benign  perfusion anomaly.  The remainder of the liver is otherwise unremarkable in appearance.  A subcentimeter low attenuation lesion in the spleen is too small to characterize, but is statistically benign.  There are several low attenuation lesions in the kidneys bilaterally, most of which are too small to definitively characterize, but are statistically favored to represent small cysts.  The largest renal lesion measures 1 cm and the interpolar region of the right kidney, and is compatible with a cyst.  Large volume of well formed stool in the rectum could suggest constipation.  Numerous colonic diverticula, without definite surrounding inflammatory changes to strongly suggest an acute diverticulitis.  However, there is a trace volume of fluid in the left pericolic gutter.  No pneumoperitoneum.  No pathologic distension of small bowel.  Uterus and ovaries are atrophic. Urinary bladder is unremarkable in appearance.  Musculoskeletal: There are no aggressive appearing lytic or blastic lesions noted in the visualized portions of the skeleton.  IMPRESSION: 1.  Trace volume of ascites in the left pericolic gutter.  The cause of this is uncertain. 2.  Large volume of stool in the rectal vault and throughout the more proximal colon suggestive of constipation. 3.  Numerous colonic diverticula, without definite surrounding inflammatory changes to strongly suggest the presence of acute diverticulitis.  Given the small amount of fluid in the left pericolic gutter, however, clinical correlation for signs and symptoms of diverticulitis may be warranted.  4.  Numerous low attenuation renal lesions bilaterally, that the majority of which are too small to definitively characterize. These are favored to represent cysts. However, in this patient with history of hematuria, if there is strong clinical concern for small neoplasm, this could be further evaluated with renal MRI if clinically indicated. 5.  Trace left-sided pleural effusion  layering dependently.  Original Report Authenticated By: Florencia Reasons, M.D.    Scheduled Meds:    . amLODipine  10 mg Oral Daily  . doxycycline  100 mg Oral Q12H  . hydrocortisone  25 mg Rectal BID  . lactulose  30 g Oral TID  . loratadine  10 mg Oral Daily  . metoprolol succinate  12.5 mg Oral BID  . pantoprazole  40 mg Oral Q1200  . polyethylene glycol  17 g Oral Daily  . potassium chloride  40 mEq Oral Once  . sodium chloride  3 mL Intravenous Q12H   Continuous Infusions:    . sodium chloride 75 mL/hr at 12/21/11 1610    Principal Problem:  *Rectal bleeding Active Problems:  Diarrhea  Post-menopausal bleeding    Time spent: 40 minutes   [facility]  [facility] Pager [phone]. If 8PM-8AM, please contact night-coverage at [email or web address], password [facility] 12/22/2011, 5:53 PM  LOS: 6 days

## 2011-12-23 LAB — URINALYSIS, ROUTINE W REFLEX MICROSCOPIC
Ketones, ur: NEGATIVE mg/dL
Leukocytes, UA: NEGATIVE
Nitrite: NEGATIVE
Protein, ur: NEGATIVE mg/dL
Urobilinogen, UA: 0.2 mg/dL (ref 0.0–1.0)
pH: 6.5 (ref 5.0–8.0)

## 2011-12-23 LAB — BASIC METABOLIC PANEL
CO2: 25 mEq/L (ref 19–32)
Calcium: 8.6 mg/dL (ref 8.4–10.5)
Glucose, Bld: 106 mg/dL — ABNORMAL HIGH (ref 70–99)
Sodium: 139 mEq/L (ref 135–145)

## 2011-12-23 MED ORDER — POLYETHYLENE GLYCOL 3350 17 G PO PACK: 17.0000 g | PACK | Freq: Every day | ORAL | Status: DC

## 2011-12-23 MED ORDER — HYDROCORTISONE ACETATE 25 MG RE SUPP: 25.0000 mg | Freq: Two times a day (BID) | RECTAL | Status: DC

## 2011-12-23 MED ORDER — HYDROCORTISONE ACETATE 25 MG RE SUPP: 25.0000 mg | Freq: Two times a day (BID) | RECTAL | Status: AC

## 2011-12-23 MED ORDER — DOXYCYCLINE HYCLATE 100 MG PO CAPS: 100.0000 mg | ORAL_CAPSULE | Freq: Two times a day (BID) | ORAL | Status: AC

## 2011-12-23 MED ORDER — POLYETHYLENE GLYCOL 3350 17 G PO PACK: 17.0000 g | PACK | Freq: Every day | ORAL | Status: AC

## 2011-12-23 MED ORDER — OMEPRAZOLE 40 MG PO CPDR: 40.0000 mg | DELAYED_RELEASE_CAPSULE | Freq: Two times a day (BID) | ORAL | Status: DC

## 2011-12-23 MED ORDER — DOXYCYCLINE HYCLATE 100 MG PO CAPS: 100.0000 mg | ORAL_CAPSULE | Freq: Two times a day (BID) | ORAL | Status: DC

## 2011-12-23 NOTE — Evaluation (Signed)
Physical Therapy Evaluation Patient Details Name: Becky Leach MRN: [id number] DOB: 09-07-1929 Today's Date: 12/23/2011 Time: 1005-1020 PT Time Calculation (min): 15 min  PT Assessment / Plan / Recommendation Clinical Impression  Pt admitted for rectal bleeing and s/p colonoscopy.  Pt would benefit from acute PT services in order to maximize strength and independence with mobility to prepare for d/c home with son.    PT Assessment  Patient needs continued PT services    Follow Up Recommendations  Home health PT    Barriers to Discharge        Equipment Recommendations  None recommended by PT    Recommendations for Other Services     Frequency Min 3X/week    Precautions / Restrictions Precautions Precautions: Fall   Pertinent Vitals/Pain No pain, pt denies dizziness with mobility      Mobility  Bed Mobility Bed Mobility: Not assessed Transfers Transfers: Sit to Stand;Stand to Sit Sit to Stand: 4: Min guard;From chair/3-in-1 Stand to Sit: 4: Min guard;To chair/3-in-1 Details for Transfer Assistance: verbal cues for hand placement for safety, pt assist to [facility] due to urinary incontinence episode upon ambulating in room Ambulation/Gait Ambulation/Gait Assistance: 4: Min guard Ambulation Distance (Feet): 160 Feet Assistive device: Straight cane Ambulation/Gait Assistance Details: occasional LOB but pt able to self correct Gait Pattern: Decreased stride length;Step-through pattern;Trunk flexed Gait velocity: very slow    Exercises     PT Diagnosis: Difficulty walking  PT Problem List: Decreased strength;Decreased activity tolerance;Decreased mobility;Decreased knowledge of use of DME PT Treatment Interventions: DME instruction;Gait training;Functional mobility training;Stair training;Therapeutic activities;Therapeutic exercise;Patient/family education;Balance training   PT Goals Acute Rehab PT Goals PT Goal Formulation: With patient Time For Goal Achievement:  12/30/11 Potential to Achieve Goals: Good Pt will go Supine/Side to Sit: with supervision PT Goal: Supine/Side to Sit - Progress: Goal set today Pt will go Sit to Stand: with supervision PT Goal: Sit to Stand - Progress: Goal set today Pt will Ambulate: with supervision;>150 feet;with least restrictive assistive device PT Goal: Ambulate - Progress: Goal set today Pt will Go Up / Down Stairs: with min assist;3-5 stairs;with least restrictive assistive device;with rail(s) PT Goal: Up/Down Stairs - Progress: Goal set today  Visit Information  Last PT Received On: 12/23/11 Assistance Needed: +1    Subjective Data  Subjective: I either use a RW or cane. Patient Stated Goal: return home   Prior Functioning  Home Living Lives With: Son Available Help at Discharge: Family Type of Home: House Home Access: Stairs to enter [employer] of Steps: 6 "coming from street" and then 3 steps with 2 rails Entrance Stairs-Rails: Right;Left;Can reach both Home Layout: One level Home Adaptive Equipment: Straight cane;Bedside commode/3-in-1;Walker - rolling Additional Comments: laundry in basement; son can assist; doesn't use 3in1 Prior Function Level of Independence: Independent with assistive device(s) Able to Take Stairs?: Yes Communication Communication: No difficulties    Cognition  Overall Cognitive Status: Appears within functional limits for tasks assessed/performed Arousal/Alertness: Awake/alert Orientation Level: Appears intact for tasks assessed Behavior During Session: [facility] for tasks performed    Extremity/Trunk Assessment Right Upper Extremity Assessment RUE ROM/Strength/Tone: [facility] for tasks assessed Left Upper Extremity Assessment LUE ROM/Strength/Tone: WFL for tasks assessed Right Lower Extremity Assessment RLE ROM/Strength/Tone: Deficits RLE ROM/Strength/Tone Deficits: grossly 3+/5 throughout Left Lower Extremity Assessment LLE ROM/Strength/Tone: Deficits LLE  ROM/Strength/Tone Deficits: grossly 3+/5 throughout, pt reports arthritis in L knee   Balance    End of Session PT - End of Session Activity Tolerance: Patient  tolerated treatment well Patient left: in chair;with call bell/phone within reach  GP     [facility] E 12/23/2011, 12:16 PM Pager: [phone]

## 2011-12-23 NOTE — Discharge Summary (Signed)
Physician Discharge Summary  Becky Leach:[id number] DOB: [birth year] DOA: 12/16/2011  PCP: Quitman Livings, MD  Admit date: 12/16/2011 Discharge date: 12/23/2011  Recommendations for Outpatient Follow-up:  Follow-up Information    Follow up with [name] clinic. Schedule an appointment as soon as possible for a visit in 1 week. (He be seen one week)       Follow up with [name], MD. (In one to 2 weeks, call for appointment upon discharge .)    Contact information:   [address], [address] [place] [address] [phone]     [name] -in 1 week, call for appointment upon discharge       Discharge Diagnoses:  Principal Problem:  *Rectal bleeding Active Problems:  Diarrhea  Post-menopausal bleeding   Discharge Condition: improved/stable  Diet recommendation: regular  History of present illness:  78 year old female presented with bleeding from her pelvis, questionable sources vagina versus rectal  Straight cath urine analysis did not show any RBC of gross hematuria therefore I doubt that she has hematuria  She does not need any further urologic workup  GYN was consulted and Pelvic exam showed endometritis  Hospital Course by problem list:   Diarrhea:  -Discussed above, she was admitted with diarrhea but this resolved and specimens could not be obtained and so the stool studies were discontinued. And also the empiric Flagyl was discontinued. She has not had any further diarrhea during this hospital stay. Rectal bleeding  -GI was consulted and she was seen by [name] and a colonoscopy was done on 724 and showed linear ulcer in the cecum descending colon and ileocecal valve - biopsied - results still pending at the time of discharge today and patient is to followup with [name] per results and further management as appropriate. She was also found to have Diverticulosis in the sigmoid, and descending colon and also a sessile polyp in the  descending colon which was removed. Her hemoglobin was monitored and has remained stable-last hemoglobin prior to discharge 13. Large internal hemorrhoids noted and GI recommended Anusol suppositories stating that if they did not help then surgical consultation would be considered in the future. The patient has not had any further bleeding, and she'll be discharged at this time for outpatient followup with [name] and her primary care physician. Tachycardia:  -resolved with hydration, cardiac markers negative.  Dehydration:  -resolved with hydration, will d/c ivf  Hypertension:  -cont amlodipine and metoprolol   -Patient was seen by physical therapy and home health PT recommended. Postmenopausal bleed /Endometritis  GYN was consulted and [name] saw patient and pelvic exam was done she was found to have endometritis and impaired shouldn't with doxycycline 100 your twice a day for 10 days was recommended and patient is to followup with [name] outpatient. Hypokalemia -Resolved her potassium was replaced while in the hospital. Urinary incontinence -Patient reported urinary incontinence today, UA and was been obtained and came back negative for infection, she is to follow up outpt with PCP. Procedures: Colonoscopy on 7/24 FINDINGS: In the cecum there were some linear ulcers as well as an  ulcer on the area of the ileocecal valve, and biopsies were  obtained for histological inspection. In the descending colon  there was a linear ulcer also as noted on image 1. The sigmoid and  descending colon revealed extensive diverticulosis. In the distal  descending colon there was a 1 cm sessile polyp that was snared  and removed by snare cautery technique. There were large internal  hemorrhoids noted on retroflexion.  COMPLICATIONS: None  IMPRESSION: See above  RECOMMENDATIONS: Check pathology. Treat hemorrhoids with  suppositories. Consider surgical consult for  hemorrhoids if  medical therapy does not help Pelvic exam per Dr [name], SCANTY SUPERFICIAL FRAGMENTS OF ATROPHIC ENDOMETRIAL GLANDS AND ABUNDANT MUCIN. PLEASE see full report Consultations:  Gastroenterology- Dr Evette Cristal  OB/GYN-Dr. [name]  Discharge Exam: Filed Vitals:   12/23/11 0553  BP: 123/81  Pulse: 100  Temp: 99.4 F (37.4 C)  Resp: 16   Filed Vitals:   12/22/11 1243 12/22/11 2115 12/23/11 0500 12/23/11 0553  BP: 104/67 103/69  123/81  Pulse: 96 98  100  Temp: 98.5 F (36.9 C) 99.3 F (37.4 C)  99.4 F (37.4 C)  TempSrc: Oral Oral  Oral  Resp: 18 18  16   Height:      Weight:   46.63 kg (102 lb 12.8 oz)   SpO2: 97% 98%  97%   Exam:  In General: elderly black female in no apparent distress.  Neck: Neck supple. No tracheal deviation present.  Cardiovascular: Normal rate, regular rhythm, normal heart sounds and intact distal pulses.  Pulmonary/Chest: Effort normal and breath sounds normal. No respiratory distress.  Abdominal: Soft. bowel sounds are normal. She exhibits no distension. There is no tenderness.  Musculoskeletal: She exhibits no edema and no tenderness.  Neurological: She is alert. No cranial nerve deficit.   Discharge Instructions  Discharge Orders    Future Orders Please Complete By Expires   Diet general      Increase activity slowly        Medication List  As of 12/23/2011  1:41 PM   STOP taking these medications         metroNIDAZOLE 500 MG tablet         TAKE these medications         amLODipine 10 MG tablet   Commonly known as: NORVASC   Take 10 mg by mouth daily.      CLARITIN 10 MG tablet   Generic drug: loratadine   Take 10 mg by mouth daily.      doxycycline 100 MG capsule   Commonly known as: VIBRAMYCIN   Take 1 capsule (100 mg total) by mouth every 12 (twelve) hours.      hydrochlorothiazide 25 MG tablet   Commonly known as: HYDRODIURIL   Take 25 mg by mouth daily.      hydrocortisone 25 MG  suppository   Commonly known as: ANUSOL-HC   Place 1 suppository (25 mg total) rectally 2 (two) times daily.      omeprazole 40 MG capsule   Commonly known as: PRILOSEC   Take 1 capsule (40 mg total) by mouth 2 (two) times daily.      polyethylene glycol packet   Commonly known as: MIRALAX / GLYCOLAX   Take 17 g by mouth daily.      traMADol 50 MG tablet   Commonly known as: ULTRAM   Take 50 mg by mouth 2 (two) times daily as needed. For pain           Follow-up Information    Follow up with [facility] clinic. Schedule an appointment as soon as possible for a visit in 1 week. (He be seen one week)       Follow up with [name], MD. (In one to 2 weeks, call for appointment upon discharge .)  Contact information:   [address] [place] [address] [phone]           The results of significant diagnostics from this hospitalization (including imaging, microbiology, ancillary and laboratory) are listed below for reference.    Significant Diagnostic Studies: Dg Chest 2 View  12/06/2011  *RADIOLOGY REPORT*  Clinical Data: Weakness and fever  CHEST - 2 VIEW  Comparison: None.  Findings: Cardiac silhouette is mildly enlarged.  The aorta is ectatic.  Lungs are hyperinflated.  No effusion, infiltrate, or pneumothorax.  IMPRESSION:  1.  Emphysematous change. 2.  No acute cardiopulmonary findings.  Original Report Authenticated By: Genevive Bi, M.D.   Dg Lumbar Spine Complete  12/06/2011  *RADIOLOGY REPORT*  Clinical Data: Low back pain.  No known injury.  LUMBAR SPINE - COMPLETE 4+ VIEW  Comparison: None.  Findings: Advanced disc space narrowing L5-S1. Slight endplate concavity superior L4 without frank compression fracture.  Mild to moderate disc space narrowing L1-2 and L2-3.  Anatomic alignment. Advanced lower lumbar facet arthropathy.  Moderate stool burden. No osseous destructive lesion.  IMPRESSION: Degenerative changes as described.  No  acute compression fracture or subluxation.  Original Report Authenticated By: Elsie Stain, M.D.   Ct Angio Chest W/cm &/or Wo Cm  12/08/2011  *RADIOLOGY REPORT*  Clinical Data: Elevated D-dimer, substernal chest pain  CT ANGIOGRAPHY CHEST  Technique:  Multidetector CT imaging of the chest using the standard protocol during bolus administration of intravenous contrast. Multiplanar reconstructed images including MIPs were obtained and reviewed to evaluate the vascular anatomy.  Contrast: 50mL OMNIPAQUE IOHEXOL 300 MG/ML  SOLN  Comparison: None  Findings: No filling defects in the pulmonary to suggest acute pulmonary embolism. There is a poor opacification of the lower lobe pulmonary arteries due to timing of the contrast bolus.  There is mild atelectasis at the lung bases.  No consolidation or pneumothorax.  Airways are normal.  Limited view of the upper abdomen is unremarkable.  Limited view of the skeleton is unremarkable.  No acute findings of the aorta great vessels.  No pericardial fluid.  IMPRESSION:  1.  No evidence of acute pulmonary embolism.  Evaluation of the lower lobe segmental arteries is somewhat limited due to bolus timing. 2. Bibasilar atelectasis.  Original Report Authenticated By: Genevive Bi, M.D.   US Transvaginal Non-ob  12/19/2011  *RADIOLOGY REPORT*  Clinical Data: Uterine mass  TRANSABDOMINAL AND TRANSVAGINAL ULTRASOUND OF PELVIS Technique:  Both transabdominal and transvaginal ultrasound examinations of the pelvis were performed. Transabdominal technique was performed for global imaging of the pelvis including uterus, ovaries, adnexal regions, and pelvic cul-de-sac.  It was necessary to proceed with endovaginal exam following the transabdominal exam to visualize the endometrium and ovaries, not seen transabdominally.  Comparison:  CT 12/18/2011  Findings:  Uterus: Not well visualized transvaginally due to bowel gas.  6.3 x 3.2 x 2.6 cm.  No focal abnormality.  Endometrium: 2 mm.   Fluid noted within the endometrial canal.  No focal endometrial abnormality.  Right ovary:  Not visualized.  No adnexal mass.  Left ovary: Not visualized.  No adnexal mass.  Other findings: No free fluid  IMPRESSION: Fluid within the endometrial canal but no focal endometrial thickening.  This may be asymptomatic or could be due to cervical stenosis.  In the absence of vaginal bleeding, this likely requires no further specific imaging follow-up.  Original Report Authenticated By: Harrel Lemon, M.D.   US Pelvis Complete  12/19/2011  *  RADIOLOGY REPORT*  Clinical Data: Uterine mass  TRANSABDOMINAL AND TRANSVAGINAL ULTRASOUND OF PELVIS Technique:  Both transabdominal and transvaginal ultrasound examinations of the pelvis were performed. Transabdominal technique was performed for global imaging of the pelvis including uterus, ovaries, adnexal regions, and pelvic cul-de-sac.  It was necessary to proceed with endovaginal exam following the transabdominal exam to visualize the endometrium and ovaries, not seen transabdominally.  Comparison:  CT 12/18/2011  Findings:  Uterus: Not well visualized transvaginally due to bowel gas.  6.3 x 3.2 x 2.6 cm.  No focal abnormality.  Endometrium: 2 mm.  Fluid noted within the endometrial canal.  No focal endometrial abnormality.  Right ovary:  Not visualized.  No adnexal mass.  Left ovary: Not visualized.  No adnexal mass.  Other findings: No free fluid  IMPRESSION: Fluid within the endometrial canal but no focal endometrial thickening.  This may be asymptomatic or could be due to cervical stenosis.  In the absence of vaginal bleeding, this likely requires no further specific imaging follow-up.  Original Report Authenticated By: Harrel Lemon, M.D.   Ct Abdomen Pelvis W Contrast  12/18/2011  *RADIOLOGY REPORT*  Clinical Data: Hematuria.  Abdominal pain.  Rectal bleeding.  CT ABDOMEN AND PELVIS WITH CONTRAST  Technique:  Multidetector CT imaging of the abdomen and pelvis  was performed following the standard protocol during bolus administration of intravenous contrast.  Contrast: 80mL OMNIPAQUE IOHEXOL 300 MG/ML  SOLN  Comparison: No priors.  Findings:  Lung Bases: Trace left-sided pleural effusion layering dependently. Slight prominence of the interstitial markings in the lung bases bilaterally is nonspecific.  Abdomen/Pelvis:  Study is slightly limited by gross patient motion. These limitations in mind, the appearance of the gallbladder, pancreas and bilateral adrenal glands is unremarkable.  There is a focal area of decreased enhancement adjacent to the falciform ligament of the liver, likely to represent a benign perfusion anomaly.  The remainder of the liver is otherwise unremarkable in appearance.  A subcentimeter low attenuation lesion in the spleen is too small to characterize, but is statistically benign.  There are several low attenuation lesions in the kidneys bilaterally, most of which are too small to definitively characterize, but are statistically favored to represent small cysts.  The largest renal lesion measures 1 cm and the interpolar region of the right kidney, and is compatible with a cyst.  Large volume of well formed stool in the rectum could suggest constipation.  Numerous colonic diverticula, without definite surrounding inflammatory changes to strongly suggest an acute diverticulitis.  However, there is a trace volume of fluid in the left pericolic gutter.  No pneumoperitoneum.  No pathologic distension of small bowel.  Uterus and ovaries are atrophic. Urinary bladder is unremarkable in appearance.  Musculoskeletal: There are no aggressive appearing lytic or blastic lesions noted in the visualized portions of the skeleton.  IMPRESSION: 1.  Trace volume of ascites in the left pericolic gutter.  The cause of this is uncertain. 2.  Large volume of stool in the rectal vault and throughout the more proximal colon suggestive of constipation. 3.  Numerous colonic  diverticula, without definite surrounding inflammatory changes to strongly suggest the presence of acute diverticulitis.  Given the small amount of fluid in the left pericolic gutter, however, clinical correlation for signs and symptoms of diverticulitis may be warranted.  4.  Numerous low attenuation renal lesions bilaterally, that the majority of which are too small to definitively characterize. These are favored to represent cysts. However, in this patient with  history of hematuria, if there is strong clinical concern for small neoplasm, this could be further evaluated with renal MRI if clinically indicated. 5.  Trace left-sided pleural effusion layering dependently.  Original Report Authenticated By: Florencia Reasons, M.D.    Microbiology: No results found for this or any previous visit (from the past 240 hour(s)).   Labs: Basic Metabolic Panel:  Lab 12/23/11 1610 12/22/11 0505 12/21/11 1420 12/20/11 0457 12/17/11 0555  NA 139 139 137 137 138  K 3.5 3.4* 3.8 3.0* 3.5  CL 107 108 106 103 102  CO2 25 24 17* 22 27  GLUCOSE 106* 102* 121* 109* 99  BUN 8 11 4* 4* 26*  CREATININE 0.67 0.70 0.51 0.61 0.94  CALCIUM 8.6 8.3* 8.7 8.7 9.4  MG -- -- -- -- --  PHOS -- -- -- -- --   Liver Function Tests:  Lab 12/20/11 0457 12/17/11 0555  AST 11 11  ALT 9 10  ALKPHOS 51 56  BILITOT 0.4 0.5  PROT 5.7* 6.2  ALBUMIN 2.9* 3.4*   No results found for this basename: LIPASE:5,AMYLASE:5 in the last 168 hours No results found for this basename: AMMONIA:5 in the last 168 hours CBC:  Lab 12/22/11 1925 12/22/11 0505 12/21/11 0446 12/18/11 1530 12/18/11 0540 12/17/11 0555 12/16/11 2144  WBC 9.7 9.5 8.6 7.0 6.4 -- --  NEUTROABS -- -- -- -- -- 5.4 7.6  HGB 13.3 11.4* 12.9 13.7 13.8 -- --  HCT 37.4 32.9* 36.9 38.9 39.9 -- --  MCV 84.2 84.1 83.5 82.9 84.4 -- --  PLT 206 191 212 257 253 -- --   Cardiac Enzymes:  Lab 12/18/11 1150 12/18/11 0540 12/17/11 2359 12/17/11 1800 12/17/11 1220  CKTOTAL 56  42 39 41 57  CKMB 2.5 2.1 1.9 2.0 2.2  CKMBINDEX -- -- -- -- --  TROPONINI <0.30 <0.30 <0.30 <0.30 <0.30   BNP: BNP (last 3 results)  Basename 12/06/11 2127  PROBNP 127.4   CBG: No results found for this basename: GLUCAP:5 in the last 168 hours  Time coordinating discharge: >80mins  Signed:  Selin Eisler C  [facility] 12/23/2011, 1:41 PM

## 2011-12-23 NOTE — Evaluation (Signed)
Occupational Therapy Evaluation Patient Details Name: Becky Leach MRN: [id number] DOB: December 23, 1929 Today's Date: 12/23/2011 Time: 1191-4782 OT Time Calculation (min): 25 min  OT Assessment / Plan / Recommendation Clinical Impression  This 78 year old female was admitted with rectal bleeding.  Prior to admission, she was mod I and lived with her son.  She now needs min A for LB adls and mobility.  She will benefit from skilled OT to  reach a supervision level in acute.      OT Assessment  Patient needs continued OT Services    Follow Up Recommendations  Home health OT    Barriers to Discharge      Equipment Recommendations  None recommended by OT;None recommended by PT    Recommendations for Other Services    Frequency  Min 2X/week    Precautions / Restrictions Precautions Precautions: Fall Restrictions Weight Bearing Restrictions: No   Pertinent Vitals/Pain No pain    ADL  Eating/Feeding: Simulated;Independent Where Assessed - Eating/Feeding: Chair Grooming: Simulated;Set up Where Assessed - Grooming: Unsupported sitting Upper Body Bathing: Simulated;Set up Where Assessed - Upper Body Bathing: Unsupported sitting Lower Body Bathing: Performed;Minimal assistance Where Assessed - Lower Body Bathing: Supported sit to stand Upper Body Dressing: Performed;Minimal assistance (tie gown) Where Assessed - Upper Body Dressing: Unsupported sitting Lower Body Dressing: Minimal assistance;Simulated;Performed (socks with set up--performed) Where Assessed - Lower Body Dressing: Supported sit to Pharmacist, hospital: Performed;Minimal assistance (ambulated) Acupuncturist: Materials engineer and Hygiene: Minimal assistance;Performed Where Assessed - Engineer, mining and Hygiene: Sit to stand from 3-in-1 or toilet Equipment Used: Cane Transfers/Ambulation Related to ADLs: min a with cane, min cues for safety ADL Comments:  washed and changed gown and socks due to diarrhea episode    OT Diagnosis: Generalized weakness  OT Problem List: Impaired balance (sitting and/or standing);Decreased activity tolerance;Decreased strength;Decreased safety awareness OT Treatment Interventions: Self-care/ADL training;Balance training;Patient/family education;DME and/or AE instruction;Energy conservation   OT Goals Acute Rehab OT Goals OT Goal Formulation: With patient Time For Goal Achievement: 01/06/12 Potential to Achieve Goals: Good ADL Goals Pt Will Perform Lower Body Bathing: with supervision;Sit to stand from chair ADL Goal: Lower Body Bathing - Progress: Goal set today Pt Will Perform Lower Body Dressing: with supervision;Sit to stand from chair ADL Goal: Lower Body Dressing - Progress: Goal set today Pt Will Transfer to Toilet: with supervision;3-in-1;Ambulation (all aspects) ADL Goal: Toilet Transfer - Progress: Goal set today  Visit Information  Last OT Received On: 12/23/11 Assistance Needed: +1    Subjective Data  Subjective: I need to use the bathroom Patient Stated Goal: none stated   Prior Functioning  Vision/Perception  Home Living Lives With: Son Available Help at Discharge: Family Type of Home: House Home Access: Stairs to enter [employer] of Steps: 6 "coming from street" and then 3 steps with 2 rails Entrance Stairs-Rails: Right;Left;Can reach both Home Layout: One level Bathroom Shower/Tub: Engineer, manufacturing systems: Standard Home Adaptive Equipment: Straight cane;Bedside commode/3-in-1;Walker - rolling Additional Comments: laundry in basement; son can assist; doesn't use 3in1 Prior Function Level of Independence: Independent with assistive device(s) Able to Take Stairs?: Yes Communication Communication: No difficulties Dominant Hand: Right      Cognition  Overall Cognitive Status: Appears within functional limits for tasks assessed/performed Arousal/Alertness:  Awake/alert Orientation Level: Appears intact for tasks assessed Behavior During Session: [facility] for tasks performed    Extremity/Trunk Assessment Right Upper Extremity Assessment RUE ROM/Strength/Tone: [facility] for tasks assessed  Left Upper Extremity Assessment LUE ROM/Strength/Tone: WFL for tasks assessed Right Lower Extremity Assessment RLE ROM/Strength/Tone: Deficits RLE ROM/Strength/Tone Deficits: grossly 3+/5 throughout Left Lower Extremity Assessment LLE ROM/Strength/Tone: Deficits LLE ROM/Strength/Tone Deficits: grossly 3+/5 throughout, pt reports arthritis in L knee  Mobility  Sit to Supine: 5: Supervision;With rail Transfers Sit to Stand: 4: Min guard;From chair/3-in-1 Stand to Sit: 4: Min guard;To chair/3-in-1 Details for Transfer Assistance: verbal cues for hand placement for safety, pt assist to [facility] due to urinary incontinence episode upon ambulating in room   Exercise    Balance    End of Session OT - End of Session Activity Tolerance: Patient tolerated treatment well Patient left: in bed;with call bell/phone within reach;with bed alarm set  GO     Becky Leach 12/23/2011, 3:28 PM Marica Otter, OTR/L [phone] 12/23/2011

## 2011-12-23 NOTE — Progress Notes (Signed)
Patient discharged home with sons, discharge instructions given and explained to patient/son and they verbalized understanding. Patient denies any pain or distress. Stage 2 on bilateral  Buttocks dry, no drainage or any sign of infection noted. Patient accompanied home by sons and sister.

## 2011-12-24 ENCOUNTER — Encounter (HOSPITAL_COMMUNITY): Payer: Self-pay

## 2011-12-24 ENCOUNTER — Emergency Department (HOSPITAL_COMMUNITY)
Admission: EM | Admit: 2011-12-24 | Discharge: 2011-12-25 | Disposition: A | Discharge status: Home / Self Care | Payer: Medicare Other | Attending: Emergency Medicine | Admitting: Emergency Medicine

## 2011-12-24 DIAGNOSIS — Z87440 Personal history of urinary (tract) infections: Secondary | ICD-10-CM | POA: Insufficient documentation

## 2011-12-24 DIAGNOSIS — T50901A Poisoning by unspecified drugs, medicaments and biological substances, accidental (unintentional), initial encounter: Secondary | ICD-10-CM

## 2011-12-24 DIAGNOSIS — I10 Essential (primary) hypertension: Secondary | ICD-10-CM | POA: Insufficient documentation

## 2011-12-24 DIAGNOSIS — M129 Arthropathy, unspecified: Secondary | ICD-10-CM | POA: Insufficient documentation

## 2011-12-24 DIAGNOSIS — J45909 Unspecified asthma, uncomplicated: Secondary | ICD-10-CM | POA: Insufficient documentation

## 2011-12-24 DIAGNOSIS — Z8249 Family history of ischemic heart disease and other diseases of the circulatory system: Secondary | ICD-10-CM | POA: Insufficient documentation

## 2011-12-24 DIAGNOSIS — Z0389 Encounter for observation for other suspected diseases and conditions ruled out: Secondary | ICD-10-CM | POA: Insufficient documentation

## 2011-12-24 MED ORDER — SODIUM CHLORIDE 0.9 % IV BOLUS (SEPSIS)
500.0000 mL | Freq: Once | INTRAVENOUS | Status: AC
  Administered 2011-12-25: 500 mL via INTRAVENOUS

## 2011-12-24 NOTE — ED Provider Notes (Signed)
History     CSN: [id number]  Arrival date & time 12/24/11  2304   First MD Initiated Contact with Patient 12/24/11 2318      Chief Complaint  Patient presents with  . Drug Overdose     HPI Patient presents to the emergency room with a possible accidental drug overdose. The patient took her antihypertensive pills this evening. Her son reports that he cannot take her pills and she is 3 short of what she should have.  He called the poison center and was told to come to the emergency room. Patient was just discharged from the hospital yesterday. She had presented to the emergency room on July 19 with rectal bleeding. The son notes that her heart rate has been fast and she has been urinating frequently today. The patient herself denies any complaints. She does not recall taking any extra pills. She states she thought she was taking them the way she was supposed to. She denies any vomiting, diarrhea, lightheadedness, chest pain or shortness of breath. The patient takes amlodipine 10 mg tablets and hydrochlorothiazide 25 mg tablets for her blood pressure Past Medical History  Diagnosis Date  . Hypertension   . Arthritis   . UTI (lower urinary tract infection)   . Tachycardia   . Asthma     Past Surgical History  Procedure Date  . Dilation and curettage of uterus   . Tonsillectomy and adenoidectomy   . Colonoscopy 12/21/2011    Procedure: COLONOSCOPY;  Surgeon: [name], MD;  Location: [facility];  Service: Endoscopy;  Laterality: N/A;    Family History  Problem Relation Age of Onset  . Arthritis Mother   . Heart attack Mother     History  Substance Use Topics  . Smoking status: Never Smoker   . Smokeless tobacco: Never Used  . Alcohol Use: No    OB History    Grav Para Term Preterm Abortions TAB SAB Ect Mult Living                  Review of Systems  All other systems reviewed and are negative.    Allergies  Penicillins  Home Medications   Current  Outpatient Rx  Name Route Sig Dispense Refill  . AMLODIPINE BESYLATE 10 MG PO TABS Oral Take 10 mg by mouth daily.    Marland Kitchen DOXYCYCLINE HYCLATE 100 MG PO CAPS Oral Take 1 capsule (100 mg total) by mouth every 12 (twelve) hours. 12 capsule 0    For days  . HYDROCHLOROTHIAZIDE 25 MG PO TABS Oral Take 25 mg by mouth daily.    Marland Kitchen HYDROCORTISONE ACETATE 25 MG RE SUPP Rectal Place 1 suppository (25 mg total) rectally 2 (two) times daily. 30 suppository 0  . LORATADINE 10 MG PO TABS Oral Take 10 mg by mouth daily.    Marland Kitchen OMEPRAZOLE 40 MG PO CPDR Oral Take 1 capsule (40 mg total) by mouth 2 (two) times daily. 60 capsule 0  . POLYETHYLENE GLYCOL 3350 PO PACK Oral Take 17 g by mouth daily. 30 each 0    Hold if diarrhea  . TRAMADOL HCL 50 MG PO TABS Oral Take 50 mg by mouth 2 (two) times daily as needed. For pain      There were no vitals taken for this visit.  Physical Exam  Nursing note and vitals reviewed. Constitutional: She appears well-developed and well-nourished. No distress.  HENT:  Head: Normocephalic and atraumatic.  Right Ear: External ear normal.  Left Ear: External ear normal.  Eyes: Conjunctivae are normal. Right eye exhibits no discharge. Left eye exhibits no discharge. No scleral icterus.  Neck: Neck supple. No tracheal deviation present.  Cardiovascular: Regular rhythm and intact distal pulses.  Tachycardia present.   Pulmonary/Chest: Effort normal and breath sounds normal. No stridor. No respiratory distress. She has no wheezes. She has no rales.  Abdominal: Soft. Bowel sounds are normal. She exhibits no distension. There is no tenderness. There is no rebound and no guarding.  Musculoskeletal: She exhibits no edema and no tenderness.  Neurological: She is alert. She has normal strength. No sensory deficit. Cranial nerve deficit:  no gross defecits noted. She exhibits normal muscle tone. She displays no seizure activity. Coordination normal.       Nl speech  Skin: Skin is warm  and dry. No rash noted.  Psychiatric: She has a normal mood and affect.    ED Course  Procedures (including critical care time)  Rate: 110  Rhythm: sinus tachycardia  QRS Axis: normal  Intervals: normal  ST/T Wave abnormalities: normal  Conduction Disutrbances:none  Old EKG Reviewed: rate slightly faster  Labs Reviewed  URINALYSIS, ROUTINE W REFLEX MICROSCOPIC - Abnormal; Notable for the following:    Leukocytes, UA SMALL (*)     All other components within normal limits  COMPREHENSIVE METABOLIC PANEL - Abnormal; Notable for the following:    Albumin 3.0 (*)     GFR calc non Af Amer 67 (*)     GFR calc Af Amer 77 (*)     All other components within normal limits  CBC - Abnormal; Notable for the following:    Hemoglobin 11.4 (*)     HCT 32.3 (*)     All other components within normal limits  URINE MICROSCOPIC-ADD ON - Abnormal; Notable for the following:    Squamous Epithelial / LPF FEW (*)     All other components within normal limits   No results found.    MDM  Pt has been monitored in the ED.  Her BP has remained stable.  Her son thinks she accidentally took the extra medications this evening.  She does not appear to have any evidence of toxicity.  It is possibly that she may have dropped the tablets as well.  Will dc home at this time.  Son will continue to monitor her medication regimen.        Celene Kras, MD 12/25/11 [phone]

## 2011-12-24 NOTE — ED Notes (Signed)
Family reports Pt was discharged yesterday from [facility].  Family reports pt has slurred speak yesterday before discharge.  Pt presents alert and active with care.

## 2011-12-24 NOTE — ED Notes (Signed)
Pt presents with no acute distress.  ?ingestion of RX HTN pills- son reports "short (3) HTN pills".  At home BP normal HR 121 and she is urinating a lot.

## 2011-12-25 LAB — COMPREHENSIVE METABOLIC PANEL
Albumin: 3 g/dL — ABNORMAL LOW (ref 3.5–5.2)
BUN: 10 mg/dL (ref 6–23)
Creatinine, Ser: 0.81 mg/dL (ref 0.50–1.10)
GFR calc Af Amer: 77 mL/min — ABNORMAL LOW (ref >90)
Total Bilirubin: 0.4 mg/dL (ref 0.3–1.2)
Total Protein: 6 g/dL (ref 6.0–8.3)

## 2011-12-25 LAB — URINALYSIS, ROUTINE W REFLEX MICROSCOPIC
Bilirubin Urine: NEGATIVE
Ketones, ur: NEGATIVE mg/dL
Nitrite: NEGATIVE
Protein, ur: NEGATIVE mg/dL
Urobilinogen, UA: 0.2 mg/dL (ref 0.0–1.0)

## 2011-12-25 LAB — CBC
HCT: 32.3 % — ABNORMAL LOW (ref 36.0–46.0)
MCV: 83.5 fL (ref 78.0–100.0)
Platelets: 175 10*3/uL (ref 150–400)
RBC: 3.87 MIL/uL (ref 3.87–5.11)
WBC: 10.4 10*3/uL (ref 4.0–10.5)

## 2011-12-25 NOTE — ED Notes (Signed)
Patient is alert and oriented x3.  She was given DC instructions and follow up visit instructions.  Patient gave verbal understanding. She was DC via wheelchair to home.  V/S stable.  He was not showing any signs of distress on DC 

## 2011-12-25 NOTE — ED Notes (Signed)
Opened chart to update Poison Control

## 2011-12-26 LAB — URINE CULTURE: Colony Count: 40000

## 2011-12-28 NOTE — Progress Notes (Addendum)
12/07/11 1254  PT G-Codes **NOT FOR INPATIENT CLASS**  Functional Assessment Tool Used clinical judgeement with chart review  Functional Limitation Mobility: Walking and moving around  Mobility: Walking and Moving Around Current Status (Z6109) CI  Mobility: Walking and Moving Around Goal Status (U0454) CH    late entry for 12/07/2011 ,valuation performed by Drucilla Chalet, PT as as reviewed by assessment and clinical judgement in chart documentation the above G-codes were added.  Marella Bile, PT Pager: [phone] 12/28/2011

## 2012-02-26 ENCOUNTER — Encounter (HOSPITAL_COMMUNITY): Payer: Self-pay | Admitting: *Deleted

## 2012-02-26 ENCOUNTER — Other Ambulatory Visit: Payer: Self-pay

## 2012-02-26 ENCOUNTER — Observation Stay (HOSPITAL_COMMUNITY)
Admission: EM | Admit: 2012-02-26 | Discharge: 2012-02-27 | Disposition: A | Discharge status: Home / Self Care | Payer: Medicare Other | Attending: Internal Medicine | Admitting: Internal Medicine

## 2012-02-26 DIAGNOSIS — Z79899 Other long term (current) drug therapy: Secondary | ICD-10-CM | POA: Insufficient documentation

## 2012-02-26 DIAGNOSIS — N179 Acute kidney failure, unspecified: Secondary | ICD-10-CM

## 2012-02-26 DIAGNOSIS — R42 Dizziness and giddiness: Secondary | ICD-10-CM

## 2012-02-26 DIAGNOSIS — R5381 Other malaise: Secondary | ICD-10-CM | POA: Insufficient documentation

## 2012-02-26 DIAGNOSIS — E860 Dehydration: Principal | ICD-10-CM

## 2012-02-26 DIAGNOSIS — I10 Essential (primary) hypertension: Secondary | ICD-10-CM | POA: Diagnosis present

## 2012-02-26 DIAGNOSIS — R000 Tachycardia, unspecified: Secondary | ICD-10-CM

## 2012-02-26 DIAGNOSIS — D72829 Elevated white blood cell count, unspecified: Secondary | ICD-10-CM | POA: Insufficient documentation

## 2012-02-26 DIAGNOSIS — N390 Urinary tract infection, site not specified: Secondary | ICD-10-CM

## 2012-02-26 LAB — CBC
HCT: 36.1 % (ref 36.0–46.0)
MCV: 82.2 fL (ref 78.0–100.0)
Platelets: 251 10*3/uL (ref 150–400)
RBC: 4.39 MIL/uL (ref 3.87–5.11)
WBC: 17.5 10*3/uL — ABNORMAL HIGH (ref 4.0–10.5)

## 2012-02-26 LAB — URINALYSIS, ROUTINE W REFLEX MICROSCOPIC
Hgb urine dipstick: NEGATIVE
Nitrite: NEGATIVE
Specific Gravity, Urine: 1.013 (ref 1.005–1.030)
pH: 5 (ref 5.0–8.0)

## 2012-02-26 LAB — URINE MICROSCOPIC-ADD ON

## 2012-02-26 LAB — POCT I-STAT, CHEM 8
Chloride: 98 mEq/L (ref 96–112)
Glucose, Bld: 140 mg/dL — ABNORMAL HIGH (ref 70–99)
HCT: 38 % (ref 36.0–46.0)
Potassium: 4.6 mEq/L (ref 3.5–5.1)

## 2012-02-26 LAB — POCT I-STAT TROPONIN I

## 2012-02-26 MED ORDER — SODIUM CHLORIDE 0.9 % IV BOLUS (SEPSIS)
500.0000 mL | Freq: Once | INTRAVENOUS | Status: AC
  Administered 2012-02-26: 500 mL via INTRAVENOUS

## 2012-02-26 MED ORDER — ONDANSETRON HCL 4 MG/2ML IJ SOLN: 4.0000 mg | Freq: Once | INTRAMUSCULAR | Status: DC

## 2012-02-26 NOTE — ED Notes (Signed)
Patient is alert and oriented x3.  She is complaining of left sided chest pain that started two days ago. She also is complaining of generalized weakness.  She confirms nausea and vomiting today.

## 2012-02-26 NOTE — ED Provider Notes (Signed)
History     CSN: [id number]  Arrival date & time 02/26/12  6213   First MD Initiated Contact with Patient 02/26/12 2016      Chief Complaint  Patient presents with  . Weakness  . Chest Pain    left side    (Consider location/radiation/quality/duration/timing/severity/associated sxs/prior treatment) HPI Comments: Patient states that the last 3 days she has had increased left shoulder pain that radiated to her left side.Ttoday she had 2 episodes of nausea and vomiting, after dinner.  She also reports generalized weakness and myalgias, for the past 3 days with decreased appetite.  Her son reports that she has been falling asleep easily, even during a conversation  Patient is a 78 y.o. female presenting with weakness and chest pain. The history is provided by the patient.  Weakness The primary symptoms include nausea and vomiting. Primary symptoms do not include headaches, syncope, altered mental status or fever.  Additional symptoms include weakness.  Chest Pain Primary symptoms include nausea and vomiting. Pertinent negatives for primary symptoms include no fever, no syncope and no altered mental status.  Associated symptoms include weakness.     Past Medical History  Diagnosis Date  . Hypertension   . Arthritis   . UTI (lower urinary tract infection)   . Tachycardia   . Asthma     Past Surgical History  Procedure Date  . Dilation and curettage of uterus   . Tonsillectomy and adenoidectomy   . Colonoscopy 12/21/2011    Procedure: COLONOSCOPY;  Surgeon: [name], MD;  Location: [facility];  Service: Endoscopy;  Laterality: N/A;    Family History  Problem Relation Age of Onset  . Arthritis Mother   . Heart attack Mother     History  Substance Use Topics  . Smoking status: Never Smoker   . Smokeless tobacco: Never Used  . Alcohol Use: No    OB History    Grav Para Term Preterm Abortions TAB SAB Ect Mult Living                  Review of Systems    Constitutional: Negative for fever.  Cardiovascular: Positive for chest pain. Negative for syncope.  Gastrointestinal: Positive for nausea and vomiting.  Genitourinary: Negative for dysuria.  Musculoskeletal: Negative for back pain.  Neurological: Positive for weakness. Negative for headaches.  Psychiatric/Behavioral: Negative for altered mental status.    Allergies  Penicillins  Home Medications   Current Outpatient Rx  Name Route Sig Dispense Refill  . AMLODIPINE BESYLATE 10 MG PO TABS Oral Take 10 mg by mouth daily.    Marland Kitchen LORATADINE 10 MG PO TABS Oral Take 10 mg by mouth daily.    . TRAMADOL HCL 50 MG PO TABS Oral Take 25-50 mg by mouth 2 (two) times daily as needed. For pain    . ACETAMINOPHEN 325 MG PO TABS Oral Take 2 tablets (650 mg total) by mouth every 6 (six) hours as needed for pain. 30 tablet 0  . CIPROFLOXACIN HCL 250 MG PO TABS Oral Take 1 tablet (250 mg total) by mouth 2 (two) times daily. 6 tablet 0  . ONDANSETRON HCL 4 MG PO TABS Oral Take 1 tablet (4 mg total) by mouth every 8 (eight) hours as needed for nausea. 20 tablet 0    BP 108/66  Pulse 90  Temp 99.3 F (37.4 C) (Oral)  Resp 20  Ht 5\' 2"  (1.575 m)  Wt 90 lb (40.824 kg)  BMI 16.46 kg/m2  SpO2 97%  Physical Exam  Constitutional: She appears well-developed and well-nourished. She appears cachectic. She is cooperative.  Non-toxic appearance. She does not have a sickly appearance. She does not appear ill. No distress.  HENT:  Head: Normocephalic.  Eyes: Pupils are equal, round, and reactive to light.  Cardiovascular: Tachycardia present.   Pulmonary/Chest: Effort normal. She exhibits no tenderness.  Abdominal: Soft.  Musculoskeletal: Normal range of motion. She exhibits no edema and no tenderness.  Neurological: She is alert.  Skin: Skin is warm.    ED Course  Procedures (including critical care time)  Labs Reviewed  CBC - Abnormal; Notable for the following:    WBC 17.5 (*)     All other  components within normal limits  URINALYSIS, ROUTINE W REFLEX MICROSCOPIC - Abnormal; Notable for the following:    Color, Urine AMBER (*)  BIOCHEMICALS MAY BE AFFECTED BY COLOR   APPearance CLOUDY (*)     Bilirubin Urine SMALL (*)     Leukocytes, UA TRACE (*)     All other components within normal limits  POCT I-STAT, CHEM 8 - Abnormal; Notable for the following:    Sodium 133 (*)     BUN 35 (*)     Creatinine, Ser 1.50 (*)     Glucose, Bld 140 (*)     All other components within normal limits  URINE MICROSCOPIC-ADD ON - Abnormal; Notable for the following:    Bacteria, UA MANY (*)     Casts HYALINE CASTS (*)     All other components within normal limits  BASIC METABOLIC PANEL - Abnormal; Notable for the following:    Sodium 134 (*)     Potassium 3.2 (*)     Glucose, Bld 123 (*)     GFR calc non Af Amer 60 (*)     GFR calc Af Amer 69 (*)     All other components within normal limits  CBC - Abnormal; Notable for the following:    WBC 12.2 (*)     Hemoglobin 11.6 (*)     HCT 33.3 (*)     All other components within normal limits  POCT I-STAT TROPONIN I  URINE CULTURE   Dg Chest 2 View  02/27/2012  *RADIOLOGY REPORT*  Clinical Data: Leukocytosis with shortness of breath and chest pain.  CHEST - 2 VIEW  Comparison: 12/06/2011.  CT chest from 12/08/2011.  Findings: Hyperexpansion is consistent with emphysema. The cardiopericardial silhouette is enlarged.  No edema or focal airspace consolidation.  No substantial pleural effusion. Interstitial markings are diffusely coarsened with chronic features. Telemetry leads overlie the chest.  Bones are diffusely demineralized.  IMPRESSION: Stable.  Cardiomegaly with emphysema.  No acute cardiopulmonary findings.   Original Report Authenticated By: ERIC A. MANSELL, M.D.      1. Acute kidney injury   2. Dehydration   3. Dizziness   4. Tachycardia   5. UTI (lower urinary tract infection)               Arman Filter, NP 02/27/12  2316

## 2012-02-27 ENCOUNTER — Emergency Department (HOSPITAL_COMMUNITY): Payer: Medicare Other

## 2012-02-27 DIAGNOSIS — N179 Acute kidney failure, unspecified: Secondary | ICD-10-CM

## 2012-02-27 DIAGNOSIS — R42 Dizziness and giddiness: Secondary | ICD-10-CM

## 2012-02-27 DIAGNOSIS — N390 Urinary tract infection, site not specified: Secondary | ICD-10-CM

## 2012-02-27 DIAGNOSIS — E860 Dehydration: Principal | ICD-10-CM

## 2012-02-27 DIAGNOSIS — R000 Tachycardia, unspecified: Secondary | ICD-10-CM

## 2012-02-27 LAB — CBC
HCT: 33.3 % — ABNORMAL LOW (ref 36.0–46.0)
MCHC: 34.8 g/dL (ref 30.0–36.0)
MCV: 81.8 fL (ref 78.0–100.0)
RDW: 13.6 % (ref 11.5–15.5)

## 2012-02-27 LAB — BASIC METABOLIC PANEL
BUN: 20 mg/dL (ref 6–23)
Chloride: 100 mEq/L (ref 96–112)
Creatinine, Ser: 0.89 mg/dL (ref 0.50–1.10)
GFR calc Af Amer: 69 mL/min — ABNORMAL LOW (ref >90)

## 2012-02-27 MED ORDER — ONDANSETRON HCL 4 MG/2ML IJ SOLN: 4.0000 mg | Freq: Four times a day (QID) | INTRAMUSCULAR | Status: DC | PRN

## 2012-02-27 MED ORDER — ACETAMINOPHEN 325 MG PO TABS: 650.0000 mg | ORAL_TABLET | Freq: Four times a day (QID) | ORAL | Status: DC | PRN

## 2012-02-27 MED ORDER — POTASSIUM CHLORIDE CRYS ER 20 MEQ PO TBCR
40.0000 meq | EXTENDED_RELEASE_TABLET | Freq: Once | ORAL | Status: AC
  Administered 2012-02-27: 40 meq via ORAL
  Filled 2012-02-27: qty 2

## 2012-02-27 MED ORDER — AMLODIPINE BESYLATE 10 MG PO TABS
10.0000 mg | ORAL_TABLET | Freq: Every day | ORAL | Status: DC
  Administered 2012-02-27: 10 mg via ORAL
  Filled 2012-02-27: qty 1

## 2012-02-27 MED ORDER — ACETAMINOPHEN 650 MG RE SUPP: 650.0000 mg | Freq: Four times a day (QID) | RECTAL | Status: DC | PRN

## 2012-02-27 MED ORDER — SENNOSIDES-DOCUSATE SODIUM 8.6-50 MG PO TABS
1.0000 | ORAL_TABLET | Freq: Every evening | ORAL | Status: DC | PRN
  Filled 2012-02-27: qty 1

## 2012-02-27 MED ORDER — ONDANSETRON HCL 4 MG PO TABS: 4.0000 mg | ORAL_TABLET | Freq: Three times a day (TID) | ORAL | Status: DC | PRN

## 2012-02-27 MED ORDER — ENOXAPARIN SODIUM 30 MG/0.3ML ~~LOC~~ SOLN
30.0000 mg | SUBCUTANEOUS | Status: DC
  Administered 2012-02-27: 30 mg via SUBCUTANEOUS
  Filled 2012-02-27: qty 0.3

## 2012-02-27 MED ORDER — CIPROFLOXACIN HCL 250 MG PO TABS: 250.0000 mg | ORAL_TABLET | Freq: Two times a day (BID) | ORAL | Status: DC

## 2012-02-27 MED ORDER — LORATADINE 10 MG PO TABS
10.0000 mg | ORAL_TABLET | Freq: Every day | ORAL | Status: DC
  Administered 2012-02-27: 10 mg via ORAL
  Filled 2012-02-27: qty 1

## 2012-02-27 MED ORDER — HYDROCODONE-ACETAMINOPHEN 5-325 MG PO TABS: 1.0000 | ORAL_TABLET | ORAL | Status: DC | PRN

## 2012-02-27 MED ORDER — ALUM & MAG HYDROXIDE-SIMETH 200-200-20 MG/5ML PO SUSP: 30.0000 mL | Freq: Four times a day (QID) | ORAL | Status: DC | PRN

## 2012-02-27 MED ORDER — ONDANSETRON HCL 4 MG PO TABS: 4.0000 mg | ORAL_TABLET | Freq: Four times a day (QID) | ORAL | Status: DC | PRN

## 2012-02-27 MED ORDER — ACETAMINOPHEN 325 MG PO TABS
650.0000 mg | ORAL_TABLET | Freq: Four times a day (QID) | ORAL | Status: DC | PRN
  Administered 2012-02-27: 650 mg via ORAL
  Filled 2012-02-27: qty 2

## 2012-02-27 MED ORDER — SODIUM CHLORIDE 0.9 % IV SOLN
INTRAVENOUS | Status: DC
  Administered 2012-02-27: 03:00:00 via INTRAVENOUS

## 2012-02-27 NOTE — Plan of Care (Signed)
Problem: Discharge Progression Outcomes Goal: Hemodynamically stable Outcome: Adequate for Discharge Pt has UTI  And was given a prescription for Cipro

## 2012-02-27 NOTE — Care Management Note (Signed)
    Page 1 of 1   02/27/2012     10:12:04 AM   CARE MANAGEMENT NOTE 02/27/2012  Patient:  Becky Leach, Becky Leach   Account Number:  [id number]  Date Initiated:  02/27/2012  Documentation initiated by:  Lorenda Ishihara  Subjective/Objective Assessment:   78 yo female admitted with weakness, nause and vomiting, likely dehydration. PTA lived at home with son.     Action/Plan:   Home if labs stable   Anticipated DC Date:  02/27/2012   Anticipated DC Plan:  HOME/SELF CARE      DC Planning Services  CM consult      Choice offered to / List presented to:             Status of service:  Completed, signed off Medicare Important Message given?   (If response is "NO", the following Medicare IM given date fields will be blank) Date Medicare IM given:   Date Additional Medicare IM given:    Discharge Disposition:  HOME/SELF CARE  Per UR Regulation:  Reviewed for med. necessity/level of care/duration of stay  If discussed at Long Length of Stay Meetings, dates discussed:    Comments:

## 2012-02-27 NOTE — Discharge Summary (Addendum)
Physician Discharge Summary  TALEYA MCDONALD QMV:[id number] DOB: 1929-12-29 DOA: 02/26/2012  PCP: Quitman Livings, MD  Admit date: 02/26/2012 Discharge date: 02/27/2012  Recommendations for Outpatient Follow-up:  Home with outpatient followup with PCP. Recheck BMET in 1 week  Discharge Diagnoses:  Principal Problem:  *Dehydration  Active Problems:  HTN (hypertension)  UTI (lower urinary tract infection)  Tachycardia  Acute kidney injury  Dizziness   Discharge Condition: Fair  Diet recommendation: Low sodium  Filed Weights   02/27/12 0214  Weight: 40.824 kg (90 lb)    History of present illness:  78 year old female who today had 2 episodes of nausea and vomiting, no hematemesis. She complains of some generalized pain in her left shoulder, flank and abdomen. She denies any diarrhea. She reports a sore throat for the past 2 days and some dizziness. Her appetite has been low as has been her by mouth intake for the past 2 days. Per her son she's felt warm but was afebrile. Patient does report some runny nose but she has seasonal allergies, she denies any coughs, shortness of breath, burning on urination, wheezing. She's been a bit groggy and sleepy today but not confused. She appears weaker when ambulating. Her son was concerned and brought her to the ER.   Hospital Course:  Patient was admitted to medical floor under observation. She was noted to have dehydration with likely some viral symptoms with generalized body aches and underlying UTI. She was hydrated with IV normal saline and repeat renal function this morning was normal. She is clinically improved. Her nausea is much better and I will give her a prescription of as needed and Zofran. She has been able to eat this morning. Her UA was suggestive of UTI and have started her on a three-day course of by mouth ciprofloxacin. Urine culture has been sent out and needs to be followed patient was noted to have leukocytosis possibly secondary to  dehydration and UTI which has improved this morning's lab. -Patient noted to have mild hypokalemia and has been replenished. -She was also noted to be tachycardic possibly secondary to dehydration and has now resolved. Giving her recent admissions previously she was noted to be tachycardic as well and had a 2-D echo in July 2013 which was a normal study.  Patient is clinically stable and can be discharged home with outpatient followup in one week. She will resume all her home medications except for HCTZ which I am holding for now given her AKI and hypokalemia.  Procedures:  None  Consultations:  None  Discharge Exam: Filed Vitals:   02/27/12 0045 02/27/12 0214 02/27/12 0626 02/27/12 1013  BP: 113/70 117/82 106/59 108/66  Pulse: 90 80 90   Temp:  98.3 F (36.8 C) 99.3 F (37.4 C)   TempSrc:  Oral Oral   Resp: 28 20 20    Height:  5\' 2"  (1.575 m)    Weight:  40.824 kg (90 lb)    SpO2: 96% 95% 97%     General: Elderly thin built female lying in bed in no acute distress HEENT: No pallor, moist oral mucosa Cardiovascular: Normal S1 and S2 no murmurs rub or gallop Respiratory: Clear breath sounds bilaterally Abdomen: Soft, nontender nondistended, bowel sounds present Extremities: Warm, no edema CNS: AAO x3 nonfocal  Discharge Instructions     Medication List  MEDICATION HELD  hydrochlorothiazide 25 MG tablet  Commonly known as: HYDRODIURIL         As of 02/27/2012 12:52 PM    TAKE  these medications         acetaminophen 325 MG tablet   Commonly known as: TYLENOL   Take 2 tablets (650 mg total) by mouth every 6 (six) hours as needed for pain.      amLODipine 10 MG tablet   Commonly known as: NORVASC   Take 10 mg by mouth daily.      ciprofloxacin 250 MG tablet   Commonly known as: CIPRO   Take 1 tablet (250 mg total) by mouth 2 (two) times daily.( FOR 3 DAYS)      CLARITIN 10 MG tablet   Generic drug: loratadine   Take 10 mg by mouth daily.                   ondansetron 4 MG tablet   Commonly known as: ZOFRAN   Take 1 tablet (4 mg total) by mouth every 8 (eight) hours as needed for nausea.      traMADol 50 MG tablet   Commonly known as: ULTRAM   Take 25-50 mg by mouth 2 (two) times daily as needed. For pain           Follow-up Information    Follow up with [facility], [place]. In 1 week.   Contact information:   [address] [place] [address] [phone]           The results of significant diagnostics from this hospitalization (including imaging, microbiology, ancillary and laboratory) are listed below for reference.    Significant Diagnostic Studies: Dg Chest 2 View  02/27/2012  *RADIOLOGY REPORT*  Clinical Data: Leukocytosis with shortness of breath and chest pain.  CHEST - 2 VIEW  Comparison: 12/06/2011.  CT chest from 12/08/2011.  Findings: Hyperexpansion is consistent with emphysema. The cardiopericardial silhouette is enlarged.  No edema or focal airspace consolidation.  No substantial pleural effusion. Interstitial markings are diffusely coarsened with chronic features. Telemetry leads overlie the chest.  Bones are diffusely demineralized.  IMPRESSION: Stable.  Cardiomegaly with emphysema.  No acute cardiopulmonary findings.   Original Report Authenticated By: ERIC A. MANSELL, M.D.     Microbiology: No results found for this or any previous visit (from the past 240 hour(s)).   Labs: Basic Metabolic Panel:  Lab 02/27/12 2130 02/26/12 2106  NA 134* 133*  K 3.2* 4.6  CL 100 98  CO2 23 --  GLUCOSE 123* 140*  BUN 20 35*  CREATININE 0.89 1.50*  CALCIUM 8.4 --  MG -- --  PHOS -- --   Liver Function Tests: No results found for this basename: AST:5,ALT:5,ALKPHOS:5,BILITOT:5,PROT:5,ALBUMIN:5 in the last 168 hours No results found for this basename: LIPASE:5,AMYLASE:5 in the last 168 hours No results found for this basename: AMMONIA:5 in the last 168 hours CBC:  Lab 02/27/12 1116 02/26/12 2106  02/26/12 2039  WBC 12.2* -- 17.5*  NEUTROABS -- -- --  HGB 11.6* 12.9 12.7  HCT 33.3* 38.0 36.1  MCV 81.8 -- 82.2  PLT 227 -- 251   Cardiac Enzymes: No results found for this basename: CKTOTAL:5,CKMB:5,CKMBINDEX:5,TROPONINI:5 in the last 168 hours BNP: BNP (last 3 results)  Basename 12/06/11 2127  PROBNP 127.4   CBG: No results found for this basename: GLUCAP:5 in the last 168 hours  Time coordinating discharge: 15  minutes  Signed:  Eddie North  [facility] 02/27/2012, 12:52 PM

## 2012-02-27 NOTE — Plan of Care (Signed)
Problem: Discharge Progression Outcomes Goal: Discharge plan in place and appropriate Outcome: Completed/Met Date Met:  02/27/12 Pt. Discharged to son, whom she stays with

## 2012-02-27 NOTE — Plan of Care (Signed)
Problem: Discharge Progression Outcomes Goal: Complications resolved/controlled Outcome: Adequate for Discharge Gave educational handouts for How to prevent dehydration, and signs and symptoms of it, son was in the room when discharge instructions were given

## 2012-02-27 NOTE — ED Notes (Signed)
Pt report called to 3W RN

## 2012-02-27 NOTE — ED Provider Notes (Signed)
Medical screening examination/treatment/procedure(s) were performed by non-physician practitioner and as supervising physician I was immediately available for consultation/collaboration.  On my exam the patient was in no distress.  I saw the ECG (if appropriate), relevant labs and studies - I agree with the interpretation.   Gerhard Munch, MD 02/27/12 [phone]

## 2012-02-27 NOTE — H&P (Signed)
PCP:   Quitman Livings, MD   Chief Complaint:  Weakness  HPI: This is a 78 year old female who today had 2 episodes of nausea and vomiting, no hematemesis. She complains of some generalized pain in her left shoulder, flank and abdomen. She denies any diarrhea. She reports a sore throat for the past 2 days and some dizziness. Her appetite has been low as has been her by mouth intake for the past 2 days. Per her son she's felt warm but was afebrile. Patient does report some runny nose but she has seasonal allergies, she denies any coughs, shortness of breath, burning on urination, wheezing. She's been a bit groggy and sleepy today but not confused. She appears weaker when ambulating. Her son was concerned and brought her to the ER. History provided by son and the patient. At home her pulse was 129 with her son checked twice.  Review of Systems:  The patient denies fever, weight loss, vision loss, decreased hearing, hoarseness, chest pain, syncope, dyspnea on exertion, peripheral edema, balance deficits, hemoptysis, melena, hematochezia, severe indigestion/heartburn, hematuria, incontinence, genital sores, muscle weakness, suspicious skin lesions, transient blindness, difficulty walking, depression, unusual weight change, abnormal bleeding, enlarged lymph nodes, angioedema, and breast masses.  Past Medical History: Past Medical History  Diagnosis Date  . Hypertension   . Arthritis   . UTI (lower urinary tract infection)   . Tachycardia   . Asthma    Past Surgical History  Procedure Date  . Dilation and curettage of uterus   . Tonsillectomy and adenoidectomy   . Colonoscopy 12/21/2011    Procedure: COLONOSCOPY;  Surgeon: [name], MD;  Location: [facility];  Service: Endoscopy;  Laterality: N/A;    Medications: Prior to Admission medications   Medication Sig Start Date End Date Taking? Authorizing Provider  amLODipine (NORVASC) 10 MG tablet Take 10 mg by mouth daily.   Yes Historical  Provider, MD  hydrochlorothiazide (HYDRODIURIL) 25 MG tablet Take 25 mg by mouth daily.   Yes Historical Provider, MD  loratadine (CLARITIN) 10 MG tablet Take 10 mg by mouth daily.   Yes Historical Provider, MD  traMADol (ULTRAM) 50 MG tablet Take 25-50 mg by mouth 2 (two) times daily as needed. For pain   Yes Historical Provider, MD    Allergies:   Allergies  Allergen Reactions  . Penicillins Rash    Social History:  reports that she has never smoked. She has never used smokeless tobacco. She reports that she does not drink alcohol or use illicit drugs.  Family History: Family History  Problem Relation Age of Onset  . Arthritis Mother   . Heart attack Mother     Physical Exam: Filed Vitals:   02/26/12 2300 02/26/12 2315 02/26/12 2330 02/27/12 0035  BP: 110/68 108/70 113/74 113/67  Pulse: 94 98 98 90  Temp:    99.1 F (37.3 C)  TempSrc:    Oral  Resp: 26 22 33 27  SpO2: 95% 94% 95% 96%    General:  Alert and oriented times three, well developed and nourished, elderly and weak-appearing female  Eyes: PERRLA, pink conjunctiva, no scleral icterus ENT: Moist oral mucosa, neck supple, no thyromegaly Lungs: clear to ascultation, no wheeze, no crackles, no use of accessory muscles Cardiovascular: regular rate and rhythm, no regurgitation, no gallops, no murmurs. No carotid bruits, no JVD Abdomen: soft, positive BS, non-tender, non-distended, no organomegaly, not an acute abdomen GU: not examined Neuro: CN II - XII grossly intact, sensation intact Musculoskeletal: strength 5/5  all extremities, no clubbing, cyanosis or edema Skin: no rash, no subcutaneous crepitation, no decubitus Psych: appropriate patient   Labs on Admission:   [facility] 02/26/12 2106  NA 133*  K 4.6  CL 98  CO2 --  GLUCOSE 140*  BUN 35*  CREATININE 1.50*  CALCIUM --  MG --  PHOS --   No results found for this basename: AST:2,ALT:2,ALKPHOS:2,BILITOT:2,PROT:2,ALBUMIN:2 in the last 72 hours No  results found for this basename: LIPASE:2,AMYLASE:2 in the last 72 hours  Basename 02/26/12 2106 02/26/12 2039  WBC -- 17.5*  NEUTROABS -- --  HGB 12.9 12.7  HCT 38.0 36.1  MCV -- 82.2  PLT -- 251   No results found for this basename: CKTOTAL:3,CKMB:3,CKMBINDEX:3,TROPONINI:3 in the last 72 hours No components found with this basename: POCBNP:3 No results found for this basename: DDIMER:2 in the last 72 hours No results found for this basename: HGBA1C:2 in the last 72 hours No results found for this basename: CHOL:2,HDL:2,LDLCALC:2,TRIG:2,CHOLHDL:2,LDLDIRECT:2 in the last 72 hours No results found for this basename: TSH,T4TOTAL,FREET3,T3FREE,THYROIDAB in the last 72 hours No results found for this basename: VITAMINB12:2,FOLATE:2,FERRITIN:2,TIBC:2,IRON:2,RETICCTPCT:2 in the last 72 hours  Micro Results: Results for ADITHI, GAMMON (MRN [id number]) as of 02/27/2012 00:42  Ref. Range 02/26/2012 20:54  Color, Urine Latest Range: YELLOW  AMBER (A)  APPearance Latest Range: CLEAR  CLOUDY (A)  Specific Gravity, Urine Latest Range: 1.005-1.030  1.013  pH Latest Range: 5.0-8.0  5.0  Glucose Latest Range: NEGATIVE mg/dL NEGATIVE  Bilirubin Urine Latest Range: NEGATIVE  SMALL (A)  Ketones, ur Latest Range: NEGATIVE mg/dL NEGATIVE  Protein Latest Range: NEGATIVE mg/dL NEGATIVE  Urobilinogen, UA Latest Range: 0.0-1.0 mg/dL 1.0  Nitrite Latest Range: NEGATIVE  NEGATIVE  Leukocytes, UA Latest Range: NEGATIVE  TRACE (A)  Hgb urine dipstick Latest Range: NEGATIVE  NEGATIVE  Urine-Other No range found MUCOUS PRESENT  WBC, UA Latest Range: <3 WBC/hpf 0-2  RBC / HPF Latest Range: <3 RBC/hpf 3-6  Squamous Epithelial / LPF Latest Range: RARE  RARE  Bacteria, UA Latest Range: RARE  MANY (A)  Casts Latest Range: NEGATIVE  HYALINE CASTS (A)    EKG: Tachycardia  Radiological Exams on Admission: No results found.  Assessment/Plan Present on Admission:  .Acute kidney injury Dehydration Dizziness    Leukocytosis Will bring patient in for 23 hour observation  Likely viral issue that will quickly self resolved, however, given patient's age would watch overnight and hydrate. Repeat BMP in a.m. and ambulate to be sure dizziness is resolved  Chest x-ray ordered  No antibiotics started  Tachycardia  Already improved with hydration. Again likely due to dehydration  .HTN (hypertension)  stable resume home medications except for hydrochlorothiazide   Full code DVT prophylaxis  Jadarion Halbig 02/27/2012, 12:42 AM

## 2012-03-02 LAB — URINE CULTURE

## 2012-06-07 ENCOUNTER — Ambulatory Visit (HOSPITAL_COMMUNITY)
Admission: RE | Admit: 2012-06-07 | Discharge: 2012-06-07 | Disposition: A | Discharge status: Home / Self Care | Payer: Medicare Other | Source: Ambulatory Visit | Attending: Internal Medicine | Admitting: Internal Medicine

## 2012-06-07 ENCOUNTER — Other Ambulatory Visit (HOSPITAL_COMMUNITY): Payer: Self-pay | Admitting: Internal Medicine

## 2012-06-07 DIAGNOSIS — M545 Low back pain, unspecified: Secondary | ICD-10-CM | POA: Insufficient documentation

## 2012-06-07 DIAGNOSIS — M47817 Spondylosis without myelopathy or radiculopathy, lumbosacral region: Secondary | ICD-10-CM | POA: Insufficient documentation

## 2012-06-07 DIAGNOSIS — M79606 Pain in leg, unspecified: Secondary | ICD-10-CM

## 2012-06-07 DIAGNOSIS — M25559 Pain in unspecified hip: Secondary | ICD-10-CM

## 2012-06-07 DIAGNOSIS — M79609 Pain in unspecified limb: Secondary | ICD-10-CM | POA: Insufficient documentation

## 2012-06-07 DIAGNOSIS — M25569 Pain in unspecified knee: Secondary | ICD-10-CM | POA: Insufficient documentation

## 2012-06-07 DIAGNOSIS — M4180 Other forms of scoliosis, site unspecified: Secondary | ICD-10-CM | POA: Insufficient documentation

## 2012-06-19 ENCOUNTER — Other Ambulatory Visit (HOSPITAL_COMMUNITY): Payer: Self-pay | Admitting: Internal Medicine

## 2012-06-19 DIAGNOSIS — Z1231 Encounter for screening mammogram for malignant neoplasm of breast: Secondary | ICD-10-CM

## 2012-08-08 ENCOUNTER — Ambulatory Visit (HOSPITAL_COMMUNITY): Payer: Medicare Other

## 2012-08-15 ENCOUNTER — Ambulatory Visit (HOSPITAL_COMMUNITY)
Admission: RE | Admit: 2012-08-15 | Discharge: 2012-08-15 | Disposition: A | Discharge status: Home / Self Care | Payer: Medicare Other | Source: Ambulatory Visit | Attending: Internal Medicine | Admitting: Internal Medicine

## 2012-08-15 DIAGNOSIS — Z1231 Encounter for screening mammogram for malignant neoplasm of breast: Secondary | ICD-10-CM | POA: Insufficient documentation

## 2012-08-16 ENCOUNTER — Other Ambulatory Visit: Payer: Self-pay | Admitting: Internal Medicine

## 2012-09-20 ENCOUNTER — Ambulatory Visit
Admission: RE | Admit: 2012-09-20 | Discharge: 2012-09-20 | Disposition: A | Discharge status: Home / Self Care | Payer: Medicare Other | Source: Ambulatory Visit | Attending: Internal Medicine | Admitting: Internal Medicine

## 2012-09-20 ENCOUNTER — Other Ambulatory Visit: Payer: Self-pay | Admitting: Internal Medicine

## 2012-09-20 DIAGNOSIS — N6320 Unspecified lump in the left breast, unspecified quadrant: Secondary | ICD-10-CM

## 2012-09-20 DIAGNOSIS — R928 Other abnormal and inconclusive findings on diagnostic imaging of breast: Secondary | ICD-10-CM

## 2013-09-26 ENCOUNTER — Ambulatory Visit: Payer: Medicare Other | Admitting: Physician Assistant

## 2013-11-04 ENCOUNTER — Ambulatory Visit: Payer: Medicare Other | Admitting: Internal Medicine

## 2013-11-13 ENCOUNTER — Encounter: Payer: Self-pay | Admitting: Family Medicine

## 2013-11-13 ENCOUNTER — Ambulatory Visit (INDEPENDENT_AMBULATORY_CARE_PROVIDER_SITE_OTHER): Payer: Medicare Other | Admitting: Family Medicine

## 2013-11-13 VITALS — BP 119/78 | HR 92 | Temp 98.5°F | Wt 91.0 lb

## 2013-11-13 DIAGNOSIS — M5137 Other intervertebral disc degeneration, lumbosacral region: Secondary | ICD-10-CM

## 2013-11-13 DIAGNOSIS — M5136 Other intervertebral disc degeneration, lumbar region: Secondary | ICD-10-CM

## 2013-11-13 DIAGNOSIS — Z79899 Other long term (current) drug therapy: Secondary | ICD-10-CM

## 2013-11-13 DIAGNOSIS — I10 Essential (primary) hypertension: Secondary | ICD-10-CM

## 2013-11-13 DIAGNOSIS — K5900 Constipation, unspecified: Secondary | ICD-10-CM

## 2013-11-13 LAB — CBC
HCT: 38.1 % (ref 36.0–46.0)
Hemoglobin: 13.1 g/dL (ref 12.0–15.0)
MCH: 29.4 pg (ref 26.0–34.0)
MCHC: 34.4 g/dL (ref 30.0–36.0)
MCV: 85.4 fL (ref 78.0–100.0)
PLATELETS: 218 10*3/uL (ref 150–400)
RBC: 4.46 MIL/uL (ref 3.87–5.11)
RDW: 14.9 % (ref 11.5–15.5)
WBC: 4.9 10*3/uL (ref 4.0–10.5)

## 2013-11-13 MED ORDER — LORATADINE 10 MG PO TABS: 10.0000 mg | ORAL_TABLET | Freq: Every day | ORAL | Status: DC

## 2013-11-13 MED ORDER — AMLODIPINE BESYLATE 10 MG PO TABS: 10.0000 mg | ORAL_TABLET | Freq: Every day | ORAL | Status: DC

## 2013-11-13 MED ORDER — TRAMADOL HCL 50 MG PO TABS: 25.0000 mg | ORAL_TABLET | Freq: Two times a day (BID) | ORAL | Status: DC | PRN

## 2013-11-13 NOTE — Progress Notes (Signed)
Patient ID: Becky Leach, female   DOB: 06-21-1929, 80 y.o.   MRN: [id number]    Subjective: HPI: Patient is a 80 y.o. female presenting to clinic today for new patient appointment. Previously seen at [facility] but did not follow up consistently. Her son is with her today. She is concerned about her pain and constipation.  Herniated discs/DDD- Followed by Frederik Pear. Had ESI recently. She is taking Tramadol for pain as well as arthritis in other joints. This has caused her to have some constipation. She has taken Miralax in the past which causes diarrhea. She has taken milk of magnesia but not currently.   HTN- Patient on Norvasc, doing well. Checks at home and always normal. No CP, HA, new vision changes, leg edema or SOB. Needs refills.   Health maintenance: - Gets flu shot yearly - Pneumonia vaccine 2013 - Never had Zostavax - Last Tdap unknown - Mammogram 2014, solid mass biopsied but not malignant - Colonoscopy in 2013  Past Medical History  Diagnosis Date  . Hypertension   . Arthritis   . UTI (lower urinary tract infection)   . Tachycardia   . Seasonal allergies   . Herniated disc   . Spinal stenosis   . Constipation   . AKI (acute kidney injury)     due to NSAID  . Stomach ulcer     due to NSAID   Past Surgical History  Procedure Laterality Date  . Dilation and curettage of uterus    . Tonsillectomy and adenoidectomy    . Colonoscopy  12/21/2011    Procedure: COLONOSCOPY;  Surgeon: Wonda Horner, MD;  Location: [facility];  Service: Endoscopy;  Laterality: N/A;   History   Social History  . Marital Status: Single    Spouse Name: N/A    Number of Children: 4  . Years of Education: N/A   Occupational History  . Not on file.   Social History Main Topics  . Smoking status: Never Smoker   . Smokeless tobacco: Never Used  . Alcohol Use: No  . Drug Use: No  . Sexual Activity: No   Other Topics Concern  . Not on file   Social History Narrative    Born: [place], [place]   Lives with son, [name].            ROS: Please see HPI above.  Objective: Office vital signs reviewed. BP 119/78  Pulse 92  Temp(Src) 98.5 F (36.9 C) (Oral)  Wt 91 lb (41.277 kg)  Physical Examination:  General: Awake, alert. NAD. Thin, frail appearing HEENT: Atraumatic, normocephalic. MMM Neck: No masses palpated. No LAD Pulm: CTAB, no wheezes Cardio: RRR, no murmurs appreciated Abdomen:+BS, soft, nontender, nondistended Extremities: No edema Neuro: Walks with a cane  Assessment: 80 y.o. female new patient  Plan: See Problem List and After Visit Summary

## 2013-11-13 NOTE — Patient Instructions (Signed)
I sent your refills in. I will let you know about your lab work. Take fiber and a stool softener for your constipation.  Please follow up with Dr. Skeet Simmer as scheduled to discuss other concerns.  Khushboo Chuck M. Hairford, M.D.

## 2013-11-14 ENCOUNTER — Encounter: Payer: Self-pay | Admitting: Family Medicine

## 2013-11-14 DIAGNOSIS — K5900 Constipation, unspecified: Secondary | ICD-10-CM | POA: Insufficient documentation

## 2013-11-14 DIAGNOSIS — M48061 Spinal stenosis, lumbar region without neurogenic claudication: Secondary | ICD-10-CM | POA: Insufficient documentation

## 2013-11-14 LAB — LIPID PANEL
CHOL/HDL RATIO: 2.5 ratio
CHOLESTEROL: 206 mg/dL — AB (ref 0–200)
HDL: 84 mg/dL (ref >39)
LDL Cholesterol: 103 mg/dL — ABNORMAL HIGH (ref 0–99)
Triglycerides: 95 mg/dL (ref <150)
VLDL: 19 mg/dL (ref 0–40)

## 2013-11-14 LAB — COMPREHENSIVE METABOLIC PANEL
ALT: 14 U/L (ref 0–35)
AST: 19 U/L (ref 0–37)
Albumin: 4.3 g/dL (ref 3.5–5.2)
Alkaline Phosphatase: 47 U/L (ref 39–117)
BILIRUBIN TOTAL: 0.4 mg/dL (ref 0.2–1.2)
BUN: 19 mg/dL (ref 6–23)
CO2: 28 mEq/L (ref 19–32)
CREATININE: 1 mg/dL (ref 0.50–1.10)
Calcium: 9.8 mg/dL (ref 8.4–10.5)
Chloride: 105 mEq/L (ref 96–112)
Glucose, Bld: 79 mg/dL (ref 70–99)
Potassium: 4.4 mEq/L (ref 3.5–5.3)
Sodium: 143 mEq/L (ref 135–145)
Total Protein: 6.9 g/dL (ref 6.0–8.3)

## 2013-11-14 NOTE — Assessment & Plan Note (Signed)
A: Stable.  P: - F/u with ortho as scheduled.

## 2013-11-14 NOTE — Assessment & Plan Note (Signed)
A: BP at goal. Tolerating Norvasc  P: - Refilled Norvasc - F/u with PCP in 3 months, or sooner if needed

## 2013-11-14 NOTE — Assessment & Plan Note (Signed)
A: Most likely secondary to pain medication. Advised to avoid milk of magnesia for constipation. Cannot tolerate miralax.  P: - Add fiber to diet, either through food or metamucil - Stool softener daily - Avoid stimulant laxatives

## 2013-12-12 ENCOUNTER — Ambulatory Visit (INDEPENDENT_AMBULATORY_CARE_PROVIDER_SITE_OTHER): Payer: Medicare Other | Admitting: Family Medicine

## 2013-12-12 ENCOUNTER — Encounter: Payer: Self-pay | Admitting: Family Medicine

## 2013-12-12 VITALS — BP 110/60 | HR 100 | Temp 98.2°F | Ht 62.0 in | Wt 92.0 lb

## 2013-12-12 DIAGNOSIS — E280 Estrogen excess: Secondary | ICD-10-CM

## 2013-12-12 DIAGNOSIS — N951 Menopausal and female climacteric states: Secondary | ICD-10-CM

## 2013-12-12 DIAGNOSIS — C569 Malignant neoplasm of unspecified ovary: Secondary | ICD-10-CM

## 2013-12-12 DIAGNOSIS — N7093 Salpingitis and oophoritis, unspecified: Secondary | ICD-10-CM

## 2013-12-12 DIAGNOSIS — R232 Flushing: Secondary | ICD-10-CM | POA: Insufficient documentation

## 2013-12-12 DIAGNOSIS — N7092 Oophoritis, unspecified: Secondary | ICD-10-CM

## 2013-12-12 DIAGNOSIS — R634 Abnormal weight loss: Secondary | ICD-10-CM

## 2013-12-12 MED ORDER — AMLODIPINE BESYLATE 5 MG PO TABS: 5.0000 mg | ORAL_TABLET | Freq: Every day | ORAL | Status: DC

## 2013-12-12 MED ORDER — TRAMADOL HCL 50 MG PO TABS: 25.0000 mg | ORAL_TABLET | Freq: Two times a day (BID) | ORAL | Status: DC | PRN

## 2013-12-12 NOTE — Progress Notes (Signed)
Patient ID: Becky Leach, female   DOB: 05/09/1930, 80 y.o.   MRN: [id number]   [facility] Becky Bell, MD Phone: [phone]  Subjective:  Becky Leach is a 80 y.o F presenting for close f/up  #hot flashes -occur both during the morning and at night time; unrelated to season or time of day -happens almost every day or every other day (primarly during the day)  -occuring for at least 5- 10 years; and is certainly affecting quality of life  -morning- cereal and banana; lunch- sandwich; dinner- greens or creams, chicken or [place] or meatloaf - sx seem unrelated to diet -denies fatigue or weight loss (but does have very low BMI) -has many family members with similar sx (that were told had prolonged menopause)  #HTN -Patient on Norvasc 10mg   -Checks at home and always normal per son's report -No CP, HA, new vision changes, leg edema or SOB  All systems were reviewed and were negative unless otherwise noted in the HPI  Past Medical History Patient Active Problem List   Diagnosis Date Noted  . DDD (degenerative disc disease) 11/14/2013  . Unspecified constipation 11/14/2013  . Post-menopausal bleeding 12/19/2011  . HTN (hypertension) 12/06/2011   Reviewed problem list.  Medications- reviewed and updated Chief complaint-noted No additions to family history Social history- patient is a never smoker  Objective: BP 110/60  Pulse 100  Temp(Src) 98.2 F (36.8 C) (Oral)  Ht 5\' 2"  (1.575 m)  Wt 92 lb (41.731 kg)  BMI 16.82 kg/m2 Gen: NAD, alert, cooperative with exam, thin and frail HEENT: NCAT, EOMI, PERRL Neck: FROM, supple CV: RRR, good S1/S2, no murmur, cap refill <3 Resp: CTABL, no wheezes, non-labored Abd: SNTND, BS present, no guarding or organomegaly Ext: No edema, warm, normal tone, moves UE/LE spontaneously Neuro: Alert and oriented, No gross deficits Skin: no rashes no lesions  Assessment/Plan: See problem based a/p

## 2013-12-12 NOTE — Patient Instructions (Signed)
Becky Leach it was great to see you today!  I am pleased to hear that things are going well for you.  I will call you with the results of the lab tests Please call the dietician to schedule an appointment  Please try to increase calcium in your diet in the meanwhile  Looking forward to seeing you soon [name], MD

## 2013-12-12 NOTE — Assessment & Plan Note (Signed)
A: Seeming to impact pt's quality of life at this point Son is really driving most of the conversation Unknown etiology: but could include prolonged menopausal sx vs hypoglycemic vs thyroid dysfxn vs hormonal imbalance (2/2 malignancy) vs hypotension vs medication side effect (norvasc known to cause flushing) P: cea  Estrogen level tsh Journal to track pt sx rtc in 2-4 weeks for close f/up Cut norvasc in half to 5mg  daily

## 2013-12-13 LAB — TSH: TSH: 1.557 u[IU]/mL (ref 0.350–4.500)

## 2013-12-13 LAB — CEA

## 2013-12-15 LAB — ESTROGENS, TOTAL: Estrogen: 49 pg/mL

## 2013-12-16 ENCOUNTER — Telehealth: Payer: Self-pay | Admitting: Family Medicine

## 2013-12-16 NOTE — Telephone Encounter (Signed)
Spoke w/ Becky Leach's son when I called to schedule MNT appt.  Medicare will not cover MNT b/c she does not have DM or renal disease, and Medicaid does not cover MNT for ANYone over the age of 66 in [place].  Son said she cannot be seen by a physician in [facility] b/c she can't make morning appts.  He also expressed discontent over 01/10/14 follow-up appt scheduled w/ Dr. Raoul Pitch, whose name he did not recognize, when he thought Ms. Vallin is scheduled to see Dr. Skeet Simmer.  He said he knows all he needs to know to design a nutrition plan for his mom himself, so will not plan to pursue this aspect of his mother's care with either Dr. Skeet Simmer or Dr. Raoul Pitch.

## 2013-12-17 NOTE — Telephone Encounter (Signed)
All of her results look normal! Her son may not find this very reassuring. But I think it is good news.  We could try gabapentin or low dose SSRI to see if this helps.Tonye Becket, MD

## 2013-12-18 NOTE — Telephone Encounter (Signed)
Informed of results [facility], MD

## 2014-01-10 ENCOUNTER — Other Ambulatory Visit: Payer: Self-pay | Admitting: *Deleted

## 2014-01-10 ENCOUNTER — Telehealth: Payer: Self-pay | Admitting: Family Medicine

## 2014-01-10 ENCOUNTER — Ambulatory Visit: Payer: Medicare Other | Admitting: Family Medicine

## 2014-01-10 NOTE — Telephone Encounter (Signed)
Son called because we left a message stating that Dr. Raoul Pitch is out sick today. He is very upset but as long as we can call in her medication he will be fine . They have an appointment 9/24 at 2:45 pm. He would like enough medication to last until that appointment such as two months worth in case the doctor is sick again. He also wants a call TODAY as soon as the medications are called in. jw

## 2014-01-10 NOTE — Telephone Encounter (Signed)
Son called requesting rx for his mother

## 2014-01-13 ENCOUNTER — Telehealth: Payer: Self-pay | Admitting: Family Medicine

## 2014-01-13 NOTE — Telephone Encounter (Signed)
Becky Leach called again today and would like the medication issue fixed today so that he can get everything at one time. His mother needs a refill on her Tramadol. They will be seeing Dr. Skeet Simmer on 9/8, but her medications will not last until then. Please call or get the doctor to refill these until then. Please call him only at [phone]. jw

## 2014-01-14 NOTE — Telephone Encounter (Signed)
I already called it in to the pharmacy yesterday morning. If there is an issue it is on their end. [facility], MD

## 2014-02-04 ENCOUNTER — Encounter: Payer: Self-pay | Admitting: Family Medicine

## 2014-02-04 ENCOUNTER — Ambulatory Visit (INDEPENDENT_AMBULATORY_CARE_PROVIDER_SITE_OTHER): Payer: Medicare Other | Admitting: Family Medicine

## 2014-02-04 VITALS — BP 110/60 | HR 80 | Ht 62.0 in | Wt 94.0 lb

## 2014-02-04 DIAGNOSIS — M5136 Other intervertebral disc degeneration, lumbar region: Secondary | ICD-10-CM

## 2014-02-04 DIAGNOSIS — M51379 Other intervertebral disc degeneration, lumbosacral region without mention of lumbar back pain or lower extremity pain: Secondary | ICD-10-CM

## 2014-02-04 DIAGNOSIS — Z23 Encounter for immunization: Secondary | ICD-10-CM

## 2014-02-04 DIAGNOSIS — J309 Allergic rhinitis, unspecified: Secondary | ICD-10-CM | POA: Insufficient documentation

## 2014-02-04 DIAGNOSIS — R232 Flushing: Secondary | ICD-10-CM

## 2014-02-04 DIAGNOSIS — J3089 Other allergic rhinitis: Secondary | ICD-10-CM

## 2014-02-04 DIAGNOSIS — N951 Menopausal and female climacteric states: Secondary | ICD-10-CM

## 2014-02-04 DIAGNOSIS — I10 Essential (primary) hypertension: Secondary | ICD-10-CM

## 2014-02-04 DIAGNOSIS — M5137 Other intervertebral disc degeneration, lumbosacral region: Secondary | ICD-10-CM

## 2014-02-04 MED ORDER — AMLODIPINE BESYLATE 5 MG PO TABS: 5.0000 mg | ORAL_TABLET | Freq: Every day | ORAL | Status: DC

## 2014-02-04 MED ORDER — TRAMADOL HCL 50 MG PO TABS: 25.0000 mg | ORAL_TABLET | Freq: Two times a day (BID) | ORAL | Status: DC | PRN

## 2014-02-04 MED ORDER — LORATADINE 10 MG PO TABS: 10.0000 mg | ORAL_TABLET | Freq: Every day | ORAL | Status: DC

## 2014-02-04 MED ORDER — FLUTICASONE PROPIONATE 50 MCG/ACT NA SUSP: 2.0000 | Freq: Every day | NASAL | Status: DC

## 2014-02-04 NOTE — Assessment & Plan Note (Signed)
Improved  Neg w/up to date

## 2014-02-04 NOTE — Assessment & Plan Note (Signed)
BP at goal currently Will cont norvasc at lower dose- 5mg  Could ultimately come off but son would like her on med F/up in 3 months Call if needed

## 2014-02-04 NOTE — Assessment & Plan Note (Addendum)
Worsening 2/2 lack of use of nasal steroid Will re-prescribe claritin and cont with restart of flonase

## 2014-02-04 NOTE — Patient Instructions (Signed)
Becky Leach,  I am pleased to hear that things are going well for you.  Cont tramadol as needed for pain control  Keep the amlodipine 5mg  daily  Cont using claritin and start back the flonase for irritation in your nose and throat   Looking forward to seeing you soon [name], MD

## 2014-02-04 NOTE — Assessment & Plan Note (Signed)
Improved with use of tramadol Will cont for now Only using up to 2 a day Pt to f/up with ortho for cont management- potential injections? Per son

## 2014-02-04 NOTE — Progress Notes (Signed)
Patient ID: Becky Leach, female   DOB: 09/03/1929, 80 y.o.   MRN: [id number]   [facility] Becky Bell, MD Phone: [phone]  Subjective:  Becky Leach is a 80 y.o F who presents today for routine f/up  # HTN -denies dizziness and light headedness -currently 100% compliant with norvas  -son monitors BPs at home; top number never less than 90  #hot flashes -have improved since cutting back on the amlodipine -does not currently concern patient  #Chronic pain - tramadol helping with pain- usually takes in the morning- twice at the worst  -goes to ortho center to see Becky Leach (for [place]) for potential shot in her foot   #sore throat -hurts with swallowing has trouble swallowing pills -takes claritin daily and intermittent flonase spray   All relevant systems were reviewed and were negative unless otherwise noted in the HPI  Past Medical History Reviewed problem list.  Medications- reviewed and updated Current Outpatient Prescriptions  Medication Sig Dispense Refill  . acetaminophen (TYLENOL) 325 MG tablet Take 2 tablets (650 mg total) by mouth every 6 (six) hours as needed for pain.  30 tablet  0  . amLODipine (NORVASC) 5 MG tablet Take 1 tablet (5 mg total) by mouth daily.  90 tablet  3  . loratadine (CLARITIN) 10 MG tablet Take 1 tablet (10 mg total) by mouth daily.  90 tablet  3  . traMADol (ULTRAM) 50 MG tablet Take 0.5-1 tablets (25-50 mg total) by mouth 2 (two) times daily as needed. For pain  60 tablet  0   No current facility-administered medications for this visit.   Chief complaint-noted No additions to family history Social history- patient is a never smoker  Objective: BP 110/60  Pulse 80  Ht 5\' 2"  (1.575 m)  Wt 94 lb (42.638 kg)  BMI 17.19 kg/m2 Gen: NAD, alert, cooperative with exam HEENT: NCAT, EOMI, bilat nasal turbinates inflammed; throat non erythematous  Neck: FROM, supple CV: RRR, good S1/S2, no murmur, cap refill <3 Resp: CTABL,  no wheezes, non-labored Ext: No edema, warm, normal tone, moves UE/LE spontaneously Neuro: Alert and oriented, No gross deficits Skin: no rashes no lesions  Assessment/Plan: See problem based a/p

## 2014-02-20 ENCOUNTER — Ambulatory Visit: Payer: Medicare Other | Admitting: Family Medicine

## 2014-03-31 ENCOUNTER — Encounter: Payer: Self-pay | Admitting: Family Medicine

## 2014-05-06 ENCOUNTER — Ambulatory Visit (INDEPENDENT_AMBULATORY_CARE_PROVIDER_SITE_OTHER): Payer: Medicare Other | Admitting: Family Medicine

## 2014-05-06 VITALS — BP 130/82 | HR 88 | Temp 98.2°F | Wt 95.6 lb

## 2014-05-06 DIAGNOSIS — M48061 Spinal stenosis, lumbar region without neurogenic claudication: Secondary | ICD-10-CM

## 2014-05-06 DIAGNOSIS — M4806 Spinal stenosis, lumbar region: Secondary | ICD-10-CM

## 2014-05-06 DIAGNOSIS — I10 Essential (primary) hypertension: Secondary | ICD-10-CM

## 2014-05-06 DIAGNOSIS — J309 Allergic rhinitis, unspecified: Secondary | ICD-10-CM

## 2014-05-06 MED ORDER — TRAMADOL HCL 50 MG PO TABS: 25.0000 mg | ORAL_TABLET | Freq: Two times a day (BID) | ORAL | Status: DC | PRN

## 2014-05-06 MED ORDER — LORATADINE 10 MG PO TABS: 10.0000 mg | ORAL_TABLET | Freq: Every day | ORAL | Status: DC

## 2014-05-06 MED ORDER — BECLOMETHASONE DIPROPIONATE 80 MCG/ACT NA AERS: 1.0000 | INHALATION_SPRAY | Freq: Every day | NASAL | Status: DC

## 2014-05-06 MED ORDER — AMLODIPINE BESYLATE 5 MG PO TABS: 5.0000 mg | ORAL_TABLET | Freq: Every day | ORAL | Status: DC

## 2014-05-06 NOTE — Assessment & Plan Note (Signed)
Switched to beclometh per son's request No sure if this will be covered; will fill out prior auth if needed

## 2014-05-06 NOTE — Patient Instructions (Signed)
It was good to see you guys today  I think your nerve pain is probably from your back discomfort  Your knee pain is likely from some patellar tendonitis; please alternate heating and topical medicine as you need to help with muscle relaxation You can additionally use ice or tramadol to help with inflammation   Give [place] a call if you need medicine prior to your next visit   Happy holidays! [name], MD

## 2014-05-06 NOTE — Assessment & Plan Note (Signed)
At goal. Continue current regimen. 

## 2014-05-06 NOTE — Progress Notes (Signed)
Patient ID: Becky Leach, female   DOB: August 09, 1929, 80 y.o.   MRN: [id number]   [facility] [name], MD Phone: [phone]  Subjective:  Becky Leach is a 80 y.o F who presents today for routine f/up  # HTN -denies dizziness and light headedness -currently 100% compliant with norvasc  -son monitors BPs at home; top number never less than 90 -discussed potentially being able to come off medication at last visit but son wished for her to continue  #Chronic pain - tramadol helping with pain- usually takes in the morning, will have to take up to twice a day  -specifically having more left leg discomfort  -goes to ortho center at [facility]; has had two injections but has been over more than a year since has last been seen there  - no falls   #sore throat -takes claritin daily and intermittent flonase spray- seems to help more than oral pill -son would like her to switch over to a different medication   All relevant systems were reviewed and were negative unless otherwise noted in the HPI  Past Medical History Reviewed problem list.  Medications- reviewed and updated Current Outpatient Prescriptions  Medication Sig Dispense Refill  . acetaminophen (TYLENOL) 325 MG tablet Take 2 tablets (650 mg total) by mouth every 6 (six) hours as needed for pain. 30 tablet 0  . amLODipine (NORVASC) 5 MG tablet Take 1 tablet (5 mg total) by mouth daily. 90 tablet 6  . Beclomethasone Dipropionate 80 MCG/ACT AERS Place 1 spray into the nose daily. 8.7 g 3  . fluticasone (FLONASE) 50 MCG/ACT nasal spray Place 2 sprays into both nostrils daily. 16 g 6  . loratadine (CLARITIN) 10 MG tablet Take 1 tablet (10 mg total) by mouth daily. 90 tablet 3  . traMADol (ULTRAM) 50 MG tablet Take 0.5-1 tablets (25-50 mg total) by mouth 2 (two) times daily as needed. For pain 60 tablet 3   No current facility-administered medications for this visit.   Chief complaint-noted No  additions to family history Social history- patient is a never smoker  Objective: BP 130/82 mmHg  Pulse 88  Temp(Src) 98.2 F (36.8 C) (Oral)  Wt 95 lb 9.6 oz (43.364 kg) Gen: NAD, alert, cooperative with exam HEENT: NCAT, EOMI, bilat nasal turbinates inflammed; throat non erythematous  Neck: FROM, supple CV: RRR, good S1/S2, no murmur, cap refill <3 Resp: CTABL, no wheezes, non-labored Ext: bilat pain at patellar tendon; neg FABER/FADIR testing; no paraspinous or spinous tenderness  Neuro: Alert and oriented, No gross deficits Skin: no rashes no lesions  Assessment/Plan: See problem based a/p

## 2014-05-06 NOTE — Assessment & Plan Note (Signed)
Likely causing thigh pain However also component of patellar tendonitis  Advised alternating heating and icing  Bengay if needed but separate Return to [facility] if needed for further injections  No knee joint pain

## 2014-05-07 ENCOUNTER — Other Ambulatory Visit: Payer: Self-pay | Admitting: Family Medicine

## 2014-05-07 DIAGNOSIS — J309 Allergic rhinitis, unspecified: Secondary | ICD-10-CM

## 2014-05-07 MED ORDER — MOMETASONE FUROATE 50 MCG/ACT NA SUSP: 2.0000 | Freq: Every day | NASAL | Status: DC

## 2014-08-15 ENCOUNTER — Ambulatory Visit: Payer: Medicare Other | Admitting: Family Medicine

## 2014-08-28 ENCOUNTER — Ambulatory Visit (INDEPENDENT_AMBULATORY_CARE_PROVIDER_SITE_OTHER): Payer: Medicare Other | Admitting: Family Medicine

## 2014-08-28 ENCOUNTER — Encounter: Payer: Self-pay | Admitting: Family Medicine

## 2014-08-28 VITALS — BP 134/80 | HR 82 | Temp 98.0°F | Ht 62.0 in | Wt 100.0 lb

## 2014-08-28 DIAGNOSIS — M79605 Pain in left leg: Secondary | ICD-10-CM | POA: Diagnosis present

## 2014-08-28 DIAGNOSIS — M79604 Pain in right leg: Secondary | ICD-10-CM

## 2014-08-28 MED ORDER — TRAMADOL HCL 50 MG PO TABS: 25.0000 mg | ORAL_TABLET | Freq: Two times a day (BID) | ORAL | Status: DC | PRN

## 2014-08-28 NOTE — Assessment & Plan Note (Signed)
Suspect that pain is likely referred pain 2/2 to spinal stenosis in Lumbar region.  No findings on exam that were worrisome but exam very difficult 2/2 to paint's inability to relax.  No falls, no injury -Continue Tramadol as directed twice daily; RFx5 -Aspercreme -Warming pad, away from Aspercreme use -Follow up with Ortho for repeat injection

## 2014-08-28 NOTE — Progress Notes (Addendum)
Patient ID: Becky Leach, female   DOB: [birth year], 81 y.o.   MRN: [id number]    Subjective: CC: LLE knee/hip pain HPI: Patient is a 81 y.o. female presenting to clinic today for pain. Concerns today include:  1. LLE pain Patient is accompanied by her son to appointment.  History is provided by both patient and son.  Patient reports that she has had L hip and knee pain for a few weeks now.  She reports a h/o spinal stenosis for which she had back injections over a year ago, which helped greatly.  She reports that she uses Tramadol twice daily but does not get much relief.  She is unable to take NSAIDs 2/2 gastric bleeding ulcer she had several years ago.  She reports that she has been doing squats and other exercises in attempt to decrease pain but that this does not often help much.  She also uses Bengay with little relief.  She reports pain is worse when she is standing for a while or has been sitting for a while.  She denies falls, injury, numbness or tingling in her extremities.  ROS: All other systems reviewed and are negative.  Objective: Office vital signs reviewed. BP 134/80 mmHg  Pulse 82  Temp(Src) 98 F (36.7 C) (Oral)  Ht 5\' 2"  (1.575 m)  Wt 100 lb (45.36 kg)  BMI 18.29 kg/m2  Physical Examination:  General: Awake, alert, thin elderly female, NAD, accompanied by her son HEENT: Normal, EOMI Extremities: WWP, exam limited by patient's inability to relax  Left hip: no appreciated clicks or laxity, no pain with FADIR or FABER  Left knee: negative anterior drawer test, negative posterior drawer test, negative Lachman, negative varus/valgus testing, no joint line TTP, no erythema or swelling, no bony abnormalities MSK: slow gait, uses cane for ambulation Skin: dry, intact, no rashes or lesions Neuro: Strength grossly intact, light touch sensation slightly decreased on the LLE compared to RLE  Assessment: 81 y.o. female with referred pain likely 2/2 spinal  stenosis  Plan: See Problem List and After Visit Summary   Janora Norlander, DO PGY-1, [place]

## 2014-08-28 NOTE — Patient Instructions (Signed)
It was a pleasure seeing you today, Becky Leach!  Information regarding what we discussed is included in this packet.  Please make an appointment to see me in 6 months.  I think that the pain in your Right hip/knee is coming from your back.  Continue to take the Tramadol as prescribed.  I recommend seeing the orthopedist for another injection.  You may also use aspercreme for topical relief.  If you would like you can use a heating pad on your back but I would recommend that you use this separate from the aspercreme.  Please feel free to call our office at [phone] if any questions or concerns arise.  Warm Regards, Toney Difatta M. Vilas Edgerly, DO   Spinal Stenosis Spinal stenosis is an abnormal narrowing of the canals of your spine (vertebrae). CAUSES  Spinal stenosis is caused by areas of bone pushing into the central canals of your vertebrae. This condition can be present at birth (congenital). It also may be caused by arthritic deterioration of your vertebrae (spinal degeneration).  SYMPTOMS   Pain that is generally worse with activities, particularly standing and walking.  Numbness, tingling, hot or cold sensations, weakness, or weariness in your legs.  Frequent episodes of falling.  A foot-slapping gait that leads to muscle weakness. DIAGNOSIS  Spinal stenosis is diagnosed with the use of magnetic resonance imaging (MRI) or computed tomography (CT). TREATMENT  Initial therapy for spinal stenosis focuses on the management of the pain and other symptoms associated with the condition. These therapies include:  Practicing postural changes to lessen pressure on your nerves.  Exercises to strengthen the core of your body.  Loss of excess body weight.  The use of nonsteroidal anti-inflammatory medicines to reduce swelling and inflammation in your nerves. When therapies to manage pain are not successful, surgery to treat spinal stenosis may be recommended. This surgery involves removing  excess bone, which puts pressure on your nerve roots. During this surgery (laminectomy), the posterior boney arch (lamina) and excess bone around the facet joints are removed. Document Released: 08/06/2003 Document Revised: 09/30/2013 Document Reviewed: 08/24/2012 [facility] Patient Information 2015 [place], [place]. This information is not intended to replace advice given to you by your health care provider. Make sure you discuss any questions you have with your health care provider.

## 2014-11-19 ENCOUNTER — Encounter: Payer: Self-pay | Admitting: Family Medicine

## 2014-11-19 ENCOUNTER — Ambulatory Visit (INDEPENDENT_AMBULATORY_CARE_PROVIDER_SITE_OTHER): Payer: Medicare Other | Admitting: Family Medicine

## 2014-11-19 VITALS — BP 107/75 | HR 91 | Temp 98.4°F | Ht 62.0 in | Wt 101.1 lb

## 2014-11-19 DIAGNOSIS — N3941 Urge incontinence: Secondary | ICD-10-CM | POA: Diagnosis not present

## 2014-11-19 DIAGNOSIS — I10 Essential (primary) hypertension: Secondary | ICD-10-CM

## 2014-11-19 DIAGNOSIS — M79605 Pain in left leg: Secondary | ICD-10-CM

## 2014-11-19 MED ORDER — TRAMADOL HCL 50 MG PO TABS: 25.0000 mg | ORAL_TABLET | Freq: Two times a day (BID) | ORAL | Status: DC | PRN

## 2014-11-19 NOTE — Assessment & Plan Note (Addendum)
No evidence of urinary tract infection.  Since patient is geriatric, would not prefer to start medication.  Risks in my opinion outweigh benefits.  Discussed this with son who also agrees with plan. -Recommended "freeze and squeeze" mantra.   -Continue to use depends adult diapers -Limit intake before bed to avoid night time awakenings -Follow up as needed.

## 2014-11-19 NOTE — Assessment & Plan Note (Signed)
Blood pressure controlled. -CMET ordered

## 2014-11-19 NOTE — Patient Instructions (Addendum)
It was a pleasure seeing you today, Ms Butkiewicz!  Information regarding what we discussed is included in this packet.  Please make an appointment to get fasting labs done.  Continue to use Claritin for allergy symptoms.  You should also use your nasal spray if no relief with Claritin.  Schedule an appointment with ortho for your injection for your Left leg.  I will call the pharmacy with a 90 day supply of your Tramadol.  Please feel free to call our office at [phone] if any questions or concerns arise.   Warm Regards, Latrise Bowland M. Kathlyn Leachman, DO  Allergies  Allergies may happen from anything your body is sensitive to. This may be food, medicines, pollens, chemicals, and many other things. Food allergies can be severe and deadly.  HOME CARE  If you do not know what causes a reaction, keep a diary. Write down the foods you ate and the symptoms that followed. Avoid foods that cause reactions.  If you have red raised spots (hives) or a rash:  Take medicine as told by your doctor.  Use medicines for red raised spots and itching as needed.  Apply cold cloths (compresses) to the skin. Take a cool bath. Avoid hot baths or showers.  If you are severely allergic:  It is often necessary to go to the hospital after you have treated your reaction.  Wear your medical alert jewelry.  You and your family must learn how to give a allergy shot or use an allergy kit (anaphylaxis kit).  Always carry your allergy kit or shot with you. Use this medicine as told by your doctor if a severe reaction is occurring. GET HELP RIGHT AWAY IF:  You have trouble breathing or are making high-pitched whistling sounds (wheezing).  You have a tight feeling in your chest or throat.  You have a puffy (swollen) mouth.  You have red raised spots, puffiness (swelling), or itching all over your body.  You have had a severe reaction that was helped by your allergy kit or shot. The reaction can return once the  medicine has worn off.  You think you are having a food allergy. Symptoms most often happen within 30 minutes of eating a food.  Your symptoms have not gone away within 2 days or are getting worse.  You have new symptoms.  You want to retest yourself with a food or drink you think causes an allergic reaction. Only do this under the care of a doctor. MAKE SURE YOU:   Understand these instructions.  Will watch your condition.  Will get help right away if you are not doing well or get worse. Document Released: 09/10/2012 Document Reviewed: 09/10/2012 [facility] Patient Information 2015 [place]. This information is not intended to replace advice given to you by your health care provider. Make sure you discuss any questions you have with your health care provider.

## 2014-11-19 NOTE — Progress Notes (Signed)
Patient ID: Becky Leach, female   DOB: 04/03/1930, 81 y.o.   MRN: [id number]    Subjective: WS:[id number] HPI: Patient is a 81 y.o. female presenting to clinic today for office visit.  She is accompanied by her son, who helps with her healthcare. Concerns today include:  1. Diarrhea Patient reports that she had diarrhea for 1-2 days.  Last episode >1 week ago.  She denies hematochezia, melena, nausea, vomiting, dehydration, abdominal pain/distension.  No sick contacts.  Again symptoms have resolved.  2. Urinary frequency Reports that she has had frequency/urge incontinence for at least 3 years.  Only has accidents when she can't make it to the bathroom in time.  She is aware of when she needs to go to the bathroom.  Uses depends underwear, with infrequent accidents.  Denies hematuria, fevers, chills, dysuria, foul odors.    3. Pain in left lower leg Continued discomfort.  She has not been able to get to see ortho yet because her son is her ride and he is working on some of his own health issues.  Plan to schedule soon.  In the meantime, she continues to use Tramadol 50mg  twice daily.  She is planning on weaning down to 1 time daily or off once she gets her injection.  She denies falls, weakness.    Social History Reviewed: non smoker. FamHx and MedHx updated.  Please see EMR. Health Maintenance: TDap due  ROS: All other systems reviewed and are negative.  Objective: Office vital signs reviewed. BP 107/75 mmHg  Pulse 91  Temp(Src) 98.4 F (36.9 C) (Oral)  Ht 5\' 2"  (1.575 m)  Wt 101 lb 1 oz (45.842 kg)  BMI 18.48 kg/m2  Physical Examination:  General: Awake, alert, thin elderly female, pleasant, NAD HEENT: Normal, EOMI Cardio: RRR, S1S2 heard, no murmurs appreciated Pulm: CTAB, no wheezes, rhonchi or rales Extremities: WWP, No edema, cyanosis or clubbing; +2 pulses bilaterally MSK: Normal gait and station. Uses cane to ambulate Skin: dry, intact, no rashes or lesions Neuro: no  focal deficits, follows commands, speech normal  Assessment: 81 y.o. female with urge incontinence, LLE pain, resolved diarrhea  Plan: See Problem List and After Visit Summary   Janora Norlander, DO PGY-1, [place]

## 2014-11-19 NOTE — Assessment & Plan Note (Addendum)
Stable. -Patient to schedule for ortho injection. -Will call and adjust Tramadol Rx to reflect 90 day supply w/ no refills.   -Discussed continued long term use of this is not in the best interest of the patient. Son seems hesitant to stop this medication for his mother, though she seems agreeable.   -Would use Tylenol in lieu of Tramadol once injection performed if continued arthritic type pain

## 2014-12-02 ENCOUNTER — Emergency Department (HOSPITAL_COMMUNITY): Payer: Medicare Other

## 2014-12-02 ENCOUNTER — Emergency Department (HOSPITAL_COMMUNITY)
Admission: EM | Admit: 2014-12-02 | Discharge: 2014-12-02 | Disposition: A | Discharge status: Home / Self Care | Payer: Medicare Other | Attending: Emergency Medicine | Admitting: Emergency Medicine

## 2014-12-02 ENCOUNTER — Encounter (HOSPITAL_COMMUNITY): Payer: Self-pay

## 2014-12-02 DIAGNOSIS — Z79899 Other long term (current) drug therapy: Secondary | ICD-10-CM | POA: Diagnosis not present

## 2014-12-02 DIAGNOSIS — Z880 Allergy status to penicillin: Secondary | ICD-10-CM | POA: Insufficient documentation

## 2014-12-02 DIAGNOSIS — Z87440 Personal history of urinary (tract) infections: Secondary | ICD-10-CM | POA: Diagnosis not present

## 2014-12-02 DIAGNOSIS — I10 Essential (primary) hypertension: Secondary | ICD-10-CM | POA: Insufficient documentation

## 2014-12-02 DIAGNOSIS — K529 Noninfective gastroenteritis and colitis, unspecified: Secondary | ICD-10-CM | POA: Diagnosis not present

## 2014-12-02 DIAGNOSIS — Z8719 Personal history of other diseases of the digestive system: Secondary | ICD-10-CM | POA: Insufficient documentation

## 2014-12-02 DIAGNOSIS — R197 Diarrhea, unspecified: Secondary | ICD-10-CM | POA: Diagnosis present

## 2014-12-02 DIAGNOSIS — Z8709 Personal history of other diseases of the respiratory system: Secondary | ICD-10-CM | POA: Diagnosis not present

## 2014-12-02 DIAGNOSIS — Z7951 Long term (current) use of inhaled steroids: Secondary | ICD-10-CM | POA: Insufficient documentation

## 2014-12-02 DIAGNOSIS — M1990 Unspecified osteoarthritis, unspecified site: Secondary | ICD-10-CM | POA: Diagnosis not present

## 2014-12-02 LAB — COMPREHENSIVE METABOLIC PANEL
ALBUMIN: 4.6 g/dL (ref 3.5–5.0)
ALT: 12 U/L — ABNORMAL LOW (ref 14–54)
AST: 23 U/L (ref 15–41)
Alkaline Phosphatase: 72 U/L (ref 38–126)
Anion gap: 10 (ref 5–15)
BUN: 17 mg/dL (ref 6–20)
CALCIUM: 9.9 mg/dL (ref 8.9–10.3)
CHLORIDE: 105 mmol/L (ref 101–111)
CO2: 27 mmol/L (ref 22–32)
CREATININE: 1.18 mg/dL — AB (ref 0.44–1.00)
GFR calc Af Amer: 48 mL/min — ABNORMAL LOW (ref >60)
GFR, EST NON AFRICAN AMERICAN: 41 mL/min — AB (ref >60)
Glucose, Bld: 106 mg/dL — ABNORMAL HIGH (ref 65–99)
POTASSIUM: 4.1 mmol/L (ref 3.5–5.1)
Sodium: 142 mmol/L (ref 135–145)
TOTAL PROTEIN: 7.8 g/dL (ref 6.5–8.1)
Total Bilirubin: 0.8 mg/dL (ref 0.3–1.2)

## 2014-12-02 LAB — URINALYSIS, ROUTINE W REFLEX MICROSCOPIC
Bilirubin Urine: NEGATIVE
GLUCOSE, UA: NEGATIVE mg/dL
Hgb urine dipstick: NEGATIVE
Ketones, ur: NEGATIVE mg/dL
Leukocytes, UA: NEGATIVE
Nitrite: NEGATIVE
PH: 5.5 (ref 5.0–8.0)
PROTEIN: NEGATIVE mg/dL
SPECIFIC GRAVITY, URINE: 1.014 (ref 1.005–1.030)
UROBILINOGEN UA: 0.2 mg/dL (ref 0.0–1.0)

## 2014-12-02 LAB — CBC WITH DIFFERENTIAL/PLATELET
Basophils Absolute: 0 10*3/uL (ref 0.0–0.1)
Basophils Relative: 0 % (ref 0–1)
EOS ABS: 0 10*3/uL (ref 0.0–0.7)
EOS PCT: 0 % (ref 0–5)
HCT: 38.3 % (ref 36.0–46.0)
Hemoglobin: 12.7 g/dL (ref 12.0–15.0)
LYMPHS ABS: 1.4 10*3/uL (ref 0.7–4.0)
LYMPHS PCT: 17 % (ref 12–46)
MCH: 28 pg (ref 26.0–34.0)
MCHC: 33.2 g/dL (ref 30.0–36.0)
MCV: 84.4 fL (ref 78.0–100.0)
MONO ABS: 0.6 10*3/uL (ref 0.1–1.0)
MONOS PCT: 8 % (ref 3–12)
Neutro Abs: 5.8 10*3/uL (ref 1.7–7.7)
Neutrophils Relative %: 75 % (ref 43–77)
PLATELETS: 216 10*3/uL (ref 150–400)
RBC: 4.54 MIL/uL (ref 3.87–5.11)
RDW: 14.2 % (ref 11.5–15.5)
WBC: 7.8 10*3/uL (ref 4.0–10.5)

## 2014-12-02 LAB — I-STAT TROPONIN, ED: TROPONIN I, POC: 0.02 ng/mL (ref 0.00–0.08)

## 2014-12-02 LAB — LIPASE, BLOOD: LIPASE: 16 U/L — AB (ref 22–51)

## 2014-12-02 MED ORDER — ONDANSETRON 4 MG PO TBDP
4.0000 mg | ORAL_TABLET | Freq: Once | ORAL | Status: AC
  Administered 2014-12-02: 4 mg via ORAL
  Filled 2014-12-02: qty 1

## 2014-12-02 MED ORDER — ONDANSETRON 4 MG PO TBDP: ORAL_TABLET | ORAL | Status: DC

## 2014-12-02 NOTE — Discharge Instructions (Signed)

## 2014-12-02 NOTE — ED Notes (Signed)
Patient transported to X-ray 

## 2014-12-02 NOTE — ED Provider Notes (Signed)
CSN: [id number]     Arrival date & time 12/02/14  1240 History   First MD Initiated Contact with Patient 12/02/14 1506     Chief Complaint  Patient presents with  . Diarrhea     (Consider location/radiation/quality/duration/timing/severity/associated sxs/prior Treatment) HPI Diarrhea yesterday afternoon. She had approximately 4-5 episodes of loose stool yesterday and about 4-5 today. Patient has not seen any blood in the stool. There is no associated abdominal pain. There is no associated fever. The patient has not been having vomiting. She had taken several ounces of prune juice yesterday for constipation. It had are even opened and was expired by more than a year. Her son is suspecting she got food poisoning from the bad prune juice. Past Medical History  Diagnosis Date  . Hypertension   . Arthritis   . UTI (lower urinary tract infection)   . Tachycardia   . Seasonal allergies   . Herniated disc   . Spinal stenosis   . Constipation   . AKI (acute kidney injury)     due to NSAID  . Stomach ulcer     due to NSAID   Past Surgical History  Procedure Laterality Date  . Dilation and curettage of uterus    . Tonsillectomy and adenoidectomy    . Colonoscopy  12/21/2011    Procedure: COLONOSCOPY;  Surgeon: Wonda Horner, MD;  Location: [facility] ENDOSCOPY;  Service: Endoscopy;  Laterality: N/A;   Family History  Problem Relation Age of Onset  . Arthritis Mother   . Heart attack Mother    History  Substance Use Topics  . Smoking status: Never Smoker   . Smokeless tobacco: Never Used  . Alcohol Use: No   OB History    Gravida Para Term Preterm AB TAB SAB Ectopic Multiple Living   4 4             Review of Systems  10 Systems reviewed and are negative for acute change except as noted in the HPI.  Allergies  Penicillins  Home Medications   Prior to Admission medications   Medication Sig Start Date End Date Taking? Authorizing Provider  acetaminophen (TYLENOL) 325 MG tablet  Take 2 tablets (650 mg total) by mouth every 6 (six) hours as needed for pain. 02/27/12  Yes Nishant Dhungel, MD  amLODipine (NORVASC) 5 MG tablet Take 1 tablet (5 mg total) by mouth daily. 05/06/14  Yes [name], MD  fluticasone (FLONASE) 50 MCG/ACT nasal spray Place 2 sprays into both nostrils daily. 02/04/14  Yes [name], MD  loratadine (CLARITIN) 10 MG tablet Take 1 tablet (10 mg total) by mouth daily. 05/06/14  Yes [name], MD  traMADol (ULTRAM) 50 MG tablet Take 0.5-1 tablets (25-50 mg total) by mouth 2 (two) times daily as needed. For pain 11/19/14  Yes Ashly M Gottschalk, DO  ondansetron (ZOFRAN ODT) 4 MG disintegrating tablet 4mg  ODT q4 hours prn nausea/vomit 12/02/14   Charlesetta Shanks, MD   BP 115/80 mmHg  Pulse 87  Temp(Src) 98.9 F (37.2 C) (Oral)  Resp 17  SpO2 100% Physical Exam  Constitutional: She is oriented to person, place, and time. She appears well-developed and well-nourished.  HENT:  Head: Normocephalic and atraumatic.  Eyes: EOM are normal. Pupils are equal, round, and reactive to light.  Neck: Neck supple.  Cardiovascular: Normal rate, regular rhythm, normal heart sounds and intact distal pulses.   Pulmonary/Chest: Effort normal and breath sounds normal.  Abdominal: Soft. Bowel sounds  are normal. She exhibits no distension. There is no tenderness.  Genitourinary:  Rectal examination: The rectal vault is empty. There is no blood in the rectal vault. Trace stool pale brown.  Musculoskeletal: Normal range of motion. She exhibits no edema.  Neurological: She is alert and oriented to person, place, and time. She has normal strength. Coordination normal. GCS eye subscore is 4. GCS verbal subscore is 5. GCS motor subscore is 6.  Skin: Skin is warm, dry and intact.  Psychiatric: She has a normal mood and affect.    ED Course  Procedures (including critical care time) Labs Review Labs Reviewed  COMPREHENSIVE METABOLIC PANEL - Abnormal; Notable for the  following:    Glucose, Bld 106 (*)    Creatinine, Ser 1.18 (*)    ALT 12 (*)    GFR calc non Af Amer 41 (*)    GFR calc Af Amer 48 (*)    All other components within normal limits  LIPASE, BLOOD - Abnormal; Notable for the following:    Lipase 16 (*)    All other components within normal limits  STOOL CULTURE  CLOSTRIDIUM DIFFICILE BY PCR (NOT AT [facility])  CBC WITH DIFFERENTIAL/PLATELET  URINALYSIS, ROUTINE W REFLEX MICROSCOPIC (NOT AT [facility])  I-STAT TROPOININ, ED    Imaging Review Dg Abd Acute W/chest  12/02/2014   CLINICAL DATA:  Intermittent abdominal pain and diarrhea for 1 day  EXAM: DG ABDOMEN ACUTE W/ 1V CHEST  COMPARISON:  Chest radiograph February 27, 2012; CT abdomen and pelvis December 18, 2011  FINDINGS: PA chest: There is slight scarring in the left base. There is no edema or consolidation. The heart size and pulmonary vascularity are normal. There is a hiatal type hernia.  Supine and upright abdomen: There is fairly diffuse stool throughout colon. There is no bowel dilatation or air-fluid level suggesting obstruction. No free air. No abnormal calcification. There is lumbar levoscoliosis.  IMPRESSION: Fairly diffuse stool throughout colon. No obstruction or free air. Moderate hiatal hernia. No lung edema or consolidation.   Electronically Signed   By: Lowella Grip III M.D.   On: 12/02/2014 16:36     EKG Interpretation None      MDM   Final diagnoses:  Gastroenteritis   The patient symptoms started yesterday. She has had stool without blood, fever or abdominal pain. At this time I feel this is of lower probability to be a bacterial type of diarrheal illness. Some was concern for possible food poisoning due to expired. Son also notes he feels that he may be coming down with some similar symptoms. Having developed some nausea and a bit of abdominal cramping. At this time I do feel she is safe for discharge with stable vital signs and diagnostic studies within normal limits.  There is no evidence of UTI. Her abdomen is soft she is well in appearance. The patient had one episode of emesis while in x-ray. At this time she is still well in appearance without evidence of dehydration. He she'll be given Zofran to take as needed and instructions are given for continued fluid intake.    Charlesetta Shanks, MD 12/02/14 [phone]

## 2014-12-02 NOTE — ED Notes (Signed)
Pt c/o intermittent abdominal pain starting this afternoon and diarrhea x 1 day.  Pain score 9/10.  Pt reports being constipated x 2 days ago and drank prune juice.  Pt's son is concerned because prune juice was expired and he thinks that she is dehydrated.

## 2014-12-04 ENCOUNTER — Encounter: Payer: Self-pay | Admitting: Family Medicine

## 2014-12-04 ENCOUNTER — Ambulatory Visit (INDEPENDENT_AMBULATORY_CARE_PROVIDER_SITE_OTHER): Payer: Medicare Other | Admitting: Family Medicine

## 2014-12-04 VITALS — BP 117/82 | HR 89 | Temp 98.0°F | Ht 62.0 in | Wt 96.9 lb

## 2014-12-04 DIAGNOSIS — A084 Viral intestinal infection, unspecified: Secondary | ICD-10-CM | POA: Diagnosis present

## 2014-12-04 NOTE — Patient Instructions (Signed)

## 2014-12-06 DIAGNOSIS — A084 Viral intestinal infection, unspecified: Secondary | ICD-10-CM | POA: Insufficient documentation

## 2014-12-06 LAB — STOOL CULTURE: Special Requests: NORMAL

## 2014-12-06 NOTE — Assessment & Plan Note (Signed)
-   Improving. Anticipate will continue to improve. - No antibiotics indicated at this time - Follow up stool culture from ED. Still pending. - Discussed progression to bland diet and then regular diet as tolerated - Discussed proper hand hygiene to prevent further spread.

## 2014-12-06 NOTE — Progress Notes (Signed)
Subjective:     Patient ID: Becky Leach, female   DOB: 04-28-1930, 81 y.o.   MRN: [id number]  HPI Becky Leach is an 81yo female presenting for ED Follow Up for Gastroenteritis. Presented with Son who helped with history of illness. - Presented to ED on 7/5  Complained of intermittent abdominal pain since that afternoon and diarrhea x1 day after being constipated x2 days. Note that one ED note also reports no abdominal pain.  Symptoms started after she consumed prune juice that was expired x1 year  Denied fever  Denied vomiting, however did have one episode of emesis while in ED  KUB with diffuse stool and no evidence of obstruction or free air  Diagnosed with gastroenteritis, most likely viral. Prescribed Zofran - Still notes some diarrhea, but appears to be improving since visit to ED - Denies fever, abdominal pain, further episodes of vomiting - Denies dizziness - Continues to have decent PO intake. Has been eating clear liquid diet since discharged from ED - Son also notes he has been feeling nauseous over the last few days as well  Review of Systems  Constitutional: Negative for fever.  Gastrointestinal: Positive for diarrhea. Negative for nausea, vomiting, abdominal pain and constipation.  Neurological: Negative for dizziness.       Objective:   Physical Exam  Constitutional: She appears well-developed and well-nourished. No distress.  HENT:  Moist mucous membranes  Cardiovascular: Normal rate and regular rhythm.  Exam reveals no gallop and no friction rub.   No murmur heard. Pulmonary/Chest: Effort normal. No respiratory distress. She has no wheezes. She has no rales.  Abdominal: Soft. She exhibits no distension. There is no tenderness. There is no rebound.  Hyperactive Bowel Signs  Musculoskeletal: She exhibits no edema.  Skin: No rash noted.  Good cap refill. Normal skin turgor       Assessment:     Please refer to Problem List for Assessment.    Plan:      Please refer to Problem List for Plan.

## 2015-03-16 ENCOUNTER — Other Ambulatory Visit: Payer: Self-pay | Admitting: *Deleted

## 2015-03-16 NOTE — Telephone Encounter (Signed)
Needs office visit for refills on controlled substance.  Please advise.

## 2015-03-17 ENCOUNTER — Encounter: Payer: Self-pay | Admitting: Family Medicine

## 2015-03-17 ENCOUNTER — Ambulatory Visit (INDEPENDENT_AMBULATORY_CARE_PROVIDER_SITE_OTHER): Payer: Medicare Other | Admitting: Family Medicine

## 2015-03-17 VITALS — BP 112/80 | HR 89 | Temp 98.0°F | Ht 62.0 in | Wt 100.5 lb

## 2015-03-17 DIAGNOSIS — I10 Essential (primary) hypertension: Secondary | ICD-10-CM

## 2015-03-17 DIAGNOSIS — J3089 Other allergic rhinitis: Secondary | ICD-10-CM | POA: Diagnosis not present

## 2015-03-17 DIAGNOSIS — Z23 Encounter for immunization: Secondary | ICD-10-CM | POA: Diagnosis not present

## 2015-03-17 DIAGNOSIS — M79605 Pain in left leg: Secondary | ICD-10-CM | POA: Diagnosis not present

## 2015-03-17 MED ORDER — AMLODIPINE BESYLATE 5 MG PO TABS: 5.0000 mg | ORAL_TABLET | Freq: Every day | ORAL | Status: DC

## 2015-03-17 MED ORDER — LORATADINE 10 MG PO TABS: 10.0000 mg | ORAL_TABLET | Freq: Every day | ORAL | Status: DC

## 2015-03-17 MED ORDER — TRAMADOL HCL 50 MG PO TABS: 25.0000 mg | ORAL_TABLET | Freq: Two times a day (BID) | ORAL | Status: DC | PRN

## 2015-03-17 NOTE — Patient Instructions (Addendum)
I have sent in your refills.  Please use the Tramadol sparingly and get your injection for your leg if you can.  Follow up in 6 months for you annual exam or sooner if needed.  I recommend that you schedule an appointment with Dr McDiarmid, our clinic geriatrician, in the next few months.    Hypertension Hypertension is another name for high blood pressure. High blood pressure forces your heart to work harder to pump blood. A blood pressure reading has two numbers, which includes a higher number over a lower number (example: 110/72). HOME CARE   Have your blood pressure rechecked by your doctor.  Only take medicine as told by your doctor. Follow the directions carefully. The medicine does not work as well if you skip doses. Skipping doses also puts you at risk for problems.  Do not smoke.  Monitor your blood pressure at home as told by your doctor. GET HELP IF:  You think you are having a reaction to the medicine you are taking.  You have repeat headaches or feel dizzy.  You have puffiness (swelling) in your ankles.  You have trouble with your vision. GET HELP RIGHT AWAY IF:   You get a very bad headache and are confused.  You feel weak, numb, or faint.  You get chest or belly (abdominal) pain.  You throw up (vomit).  You cannot breathe very well. MAKE SURE YOU:   Understand these instructions.  Will watch your condition.  Will get help right away if you are not doing well or get worse.   This information is not intended to replace advice given to you by your health care provider. Make sure you discuss any questions you have with your health care provider.   Document Released: 11/02/2007 Document Revised: 05/21/2013 Document Reviewed: 03/08/2013 Elsevier Interactive Patient Education [employer].

## 2015-03-17 NOTE — Assessment & Plan Note (Signed)
Well controlled on Claritin - Refills sent in

## 2015-03-17 NOTE — Progress Notes (Signed)
    Subjective: CC: hip pain HPI: Patient is a 81 y.o. female presenting to clinic today for follow up visit. Concerns today include:  1. LE pain Son notes that patient still hasn't had her back injection 2/2 his own medical issues.  Still unsure as to when he will be able to get her to ortho doc for this.  Has not been using Tramadol in over 1 month.  Has been using Aspercreme with some relief.  Pain is located in the LLE> RLE.  Pain is described as intermittently sharp and radiating down leg.  No falls, weakness.  Utilizes a cane for ambulation.  Resides with her son.  2. Hypertension Blood pressure at home: does not monitor  Blood pressure today: 112/80 Meds: Compliant with Norvasc 5mg  daily Side effects: none ROS: Denies headache, dizziness, visual changes, nausea, vomiting, chest pain, abdominal pain or shortness of breath.  3. Allergies Patient reports that allergies are well controlled with Claritin.  Is not using the nasal spray.  No cough, rhinorrhea, sneezing.  Social History Reviewed: non smoker. FamHx and MedHx updated.  Please see EMR. Health Maintenance: Flu shot due  ROS: Per HPI  Objective: Office vital signs reviewed. BP 112/80 mmHg  Pulse 89  Temp(Src) 98 F (36.7 C) (Oral)  Ht 5\' 2"  (1.575 m)  Wt 100 lb 8 oz (45.587 kg)  BMI 18.38 kg/m2  Physical Examination:  General: Awake, alert, well appearing, NAD HEENT: Normal, MMM Cardio: RRR, S1S2 heard, no murmurs appreciated Pulm: CTAB, no wheezes, rhonchi or rales, normal WOB Extremities: WWP, No edema, cyanosis or clubbing; +2 radial pulses bilaterally, no bony abnormalities of the LLE, varicose vein appreciated across the L knee, no TTP on exam, no edema, erythema or swelling of LLE. MSK: uses can for ambulation, no edema Neuro: Strength and sensation grossly intact, follows commands, no focal deficits  Assessment/ Plan: 81 y.o. female with  Benign essential HTN Controlled on Norvasc 5 mg.  Tolerating  medication well.  Denies symptoms of orthostasis.   - Will consider decreasing to 2.5mg  if BP drops below 110/80's. - Fasting CMET still pending.  Son to bring patient in for this. - Follow up in 6 months or sooner if needed - Recommend scheduling appt with Dr McDiarmid for geriatric clinic as well.  Allergic rhinitis Well controlled on Claritin - Refills sent in  Pain of left lower extremity Patient still has not obtained her back injection. - Discussed again the importance of getting this done to avoid continued use of Tramadol - Tramadol refilled #90.  Patient to use ONLY as needed. - Follow up in 6 months or sooner if needed.    Janora Norlander, DO PGY-2, [place]

## 2015-03-17 NOTE — Assessment & Plan Note (Signed)
Controlled on Norvasc 5 mg.  Tolerating medication well.  Denies symptoms of orthostasis.   - Will consider decreasing to 2.5mg  if BP drops below 110/80's. - Fasting CMET still pending.  Son to bring patient in for this. - Follow up in 6 months or sooner if needed - Recommend scheduling appt with Dr McDiarmid for geriatric clinic as well.

## 2015-03-17 NOTE — Assessment & Plan Note (Signed)
Patient still has not obtained her back injection. - Discussed again the importance of getting this done to avoid continued use of Tramadol - Tramadol refilled #90.  Patient to use ONLY as needed. - Follow up in 6 months or sooner if needed.

## 2015-03-23 NOTE — Telephone Encounter (Signed)
Patient was seen on 03/17/2015 for refills. Jazmin Hartsell,CMA

## 2015-04-15 ENCOUNTER — Telehealth: Payer: Self-pay | Admitting: Family Medicine

## 2015-04-15 NOTE — Telephone Encounter (Signed)
Medicaid, medicare are no longer covering loridenin (generic for claritin).  According to the formulary diahydrochloride or levocetrizine will be covered. Would like a RX called in for the alternative Please call son Aaron Edelman   [phone]

## 2015-04-16 ENCOUNTER — Other Ambulatory Visit: Payer: Self-pay | Admitting: Family Medicine

## 2015-04-16 DIAGNOSIS — J3089 Other allergic rhinitis: Secondary | ICD-10-CM

## 2015-04-16 MED ORDER — LEVOCETIRIZINE DIHYDROCHLORIDE 5 MG PO TABS: 5.0000 mg | ORAL_TABLET | Freq: Every evening | ORAL | Status: DC

## 2015-04-16 NOTE — Telephone Encounter (Signed)
Will forward to MD. Jazmin Hartsell,CMA  

## 2015-04-16 NOTE — Telephone Encounter (Signed)
Xyzal sent into patient's pharmacy.  Please advise patient.  Thanks.

## 2015-05-26 ENCOUNTER — Other Ambulatory Visit: Payer: Self-pay | Admitting: *Deleted

## 2015-05-26 DIAGNOSIS — M79605 Pain in left leg: Secondary | ICD-10-CM

## 2015-05-27 MED ORDER — TRAMADOL HCL 50 MG PO TABS: 25.0000 mg | ORAL_TABLET | Freq: Two times a day (BID) | ORAL | Status: DC | PRN

## 2015-05-27 NOTE — Telephone Encounter (Signed)
Rx called in and pt informed and appt made. Becky Leach, Becky Leach

## 2015-05-27 NOTE — Telephone Encounter (Signed)
Please call in refill on tramadol x1 only.  Patient needs office visit for further refills. Please advise.

## 2015-06-19 ENCOUNTER — Encounter: Payer: Self-pay | Admitting: Family Medicine

## 2015-06-19 ENCOUNTER — Ambulatory Visit (INDEPENDENT_AMBULATORY_CARE_PROVIDER_SITE_OTHER): Payer: Medicare Other | Admitting: Family Medicine

## 2015-06-19 VITALS — BP 106/75 | HR 88 | Temp 98.1°F | Ht 62.0 in | Wt 102.1 lb

## 2015-06-19 DIAGNOSIS — J3089 Other allergic rhinitis: Secondary | ICD-10-CM

## 2015-06-19 DIAGNOSIS — M4806 Spinal stenosis, lumbar region: Secondary | ICD-10-CM | POA: Diagnosis not present

## 2015-06-19 DIAGNOSIS — M48061 Spinal stenosis, lumbar region without neurogenic claudication: Secondary | ICD-10-CM

## 2015-06-19 DIAGNOSIS — M79605 Pain in left leg: Secondary | ICD-10-CM

## 2015-06-19 MED ORDER — FLUTICASONE PROPIONATE 50 MCG/ACT NA SUSP: 2.0000 | Freq: Every day | NASAL | Status: DC

## 2015-06-19 MED ORDER — DESLORATADINE 5 MG PO TABS: 5.0000 mg | ORAL_TABLET | Freq: Every day | ORAL | Status: DC

## 2015-06-19 NOTE — Progress Notes (Signed)
    Subjective: CC: Runny nose, sore throat HPI: Becky Leach is a 82 y.o. female presenting to clinic today for office visit. Concerns today include:  1. Runny nose Patient reports that she has had a runny nose and sore throat on the right side.  She notes that Claritin is no longer covered by ins, so she was switched to Xyzal.  She notes that this makes her drowsy.  She has been taking it in the morning.  She was instructed to take it in the evening.  She was worried about taking the Tramadol with Xyzal.  She is worried about being too drowsy on Xyzal and going to the restroom.  Had previously not wanted to continue Flonase but now is thinking about going back on it.  Denies fevers, sick contacts, sob.  2. LLE pain Has not gotten any back injections.  She notes that she pain has been fairly controlled with Tramadol and Aspercreme.  Taking 1 tablet of Tramadol daily.  Denies excessive drowsiness, falls, constipation.  Social History Reviewed: non smoker. FamHx and MedHx reviewed.  Please see EMR.  ROS: Per HPI  Objective: Office vital signs reviewed. BP 106/75 mmHg  Pulse 88  Temp(Src) 98.1 F (36.7 C) (Oral)  Ht 5\' 2"  (1.575 m)  Wt 102 lb 1.6 oz (46.312 kg)  BMI 18.67 kg/m2  Physical Examination:  General: Awake, alert, thin elderly female, No acute distress HEENT: Normal    Neck: No masses palpated. No lymphadenopathy    Ears: Tympanic membranes intact, normal light reflex, no erythema, no bulging    Eyes: PERRLA, EOMI    Nose: nasal turbinates moist, boggy    Throat: moist mucus membranes, no erythema, no tonsillar exudate Cardio: regular rate and rhythm, S1S2 heard, no murmurs appreciated Pulm: globally decreased breathsounds, no wheezes, rhonchi or rales, normal work of breathing on RA Extremities: callous formation at first PIP, no skin break down, no erythema or edema, no increased Warmth. +TTP MSK: ambulates with cane.  Assessment/ Plan: 82 y.o. female   1.  Other allergic rhinitis.  Boggy nasal turbinates on exam.  No evidence of sinus infection or other bacterial infection on exam.  Afebrile. - Stop Xyzal - desloratadine (CLARINEX) 5 MG tablet; Take 1 tablet (5 mg total) by mouth daily.  Dispense: 30 tablet; Refill: 12 - fluticasone (FLONASE) 50 MCG/ACT nasal spray; Place 2 sprays into both nostrils daily.  Dispense: 16 g; Refill: 6 - Discussed possibility of needing a prior auth.  2.Lumbar stenosis/ Pain of left lower extremity. Controlled on once daily Tramadol.  - Again, cautioned continued use in elderly.   - Recommend back injection - Refill authorized on Tramadol 05/27/15.  Taking once daily, so should last through March 2017. - Follow up in 3 months or sooner if needed   Janora Norlander, DO PGY-2, [place]

## 2015-06-19 NOTE — Patient Instructions (Addendum)
I have sent in Flonase and Clarinex for you.  Plan to follow up with me in 3 months or sooner if needed for allergies and leg pain.  Allergies An allergy is when your body reacts to a substance in a way that is not normal. An allergic reaction can happen after you:  Eat something.  Breathe in something.  Touch something. WHAT KINDS OF ALLERGIES ARE THERE? You can be allergic to:  Things that are only around during certain seasons, like molds and pollens.  Foods.  Drugs.  Insects.  Animal dander. WHAT ARE SYMPTOMS OF ALLERGIES?  Puffiness (swelling). This may happen on the lips, face, tongue, mouth, or throat.  Sneezing.  Coughing.  Breathing loudly (wheezing).  Stuffy nose.  Tingling in the mouth.  A rash.  Itching.  Itchy, red, puffy areas of skin (hives).  Watery eyes.  Throwing up (vomiting).  Watery poop (diarrhea).  Dizziness.  Feeling faint or fainting.  Trouble breathing or swallowing.  A tight feeling in the chest.  A fast heartbeat. HOW ARE ALLERGIES DIAGNOSED? Allergies can be diagnosed with:  A medical and family history.  Skin tests.  Blood tests.  A food diary. A food diary is a record of all the foods, drinks, and symptoms you have each day.  The results of an elimination diet. This diet involves making sure not to eat certain foods and then seeing what happens when you start eating them again. HOW ARE ALLERGIES TREATED? There is no cure for allergies, but allergic reactions can be treated with medicine. Severe reactions usually need to be treated at a hospital.  HOW CAN REACTIONS BE PREVENTED? The best way to prevent an allergic reaction is to avoid the thing you are allergic to. Allergy shots and medicines can also help prevent reactions in some cases.   This information is not intended to replace advice given to you by your health care provider. Make sure you discuss any questions you have with your health care provider.    Document Released: 09/10/2012 Document Revised: 06/06/2014 Document Reviewed: 02/25/2014 Elsevier Interactive Patient Education [employer].

## 2015-06-23 ENCOUNTER — Other Ambulatory Visit: Payer: Self-pay | Admitting: *Deleted

## 2015-06-23 DIAGNOSIS — M79605 Pain in left leg: Secondary | ICD-10-CM

## 2015-06-24 ENCOUNTER — Ambulatory Visit: Payer: Medicare Other | Admitting: Family Medicine

## 2015-07-20 ENCOUNTER — Other Ambulatory Visit: Payer: Self-pay | Admitting: *Deleted

## 2015-07-20 DIAGNOSIS — M79605 Pain in left leg: Secondary | ICD-10-CM

## 2015-07-22 ENCOUNTER — Telehealth: Payer: Self-pay | Admitting: *Deleted

## 2015-07-22 ENCOUNTER — Other Ambulatory Visit: Payer: Self-pay | Admitting: Family Medicine

## 2015-07-22 DIAGNOSIS — M79605 Pain in left leg: Secondary | ICD-10-CM

## 2015-07-22 MED ORDER — TRAMADOL HCL 50 MG PO TABS: ORAL_TABLET | ORAL | Status: DC

## 2015-07-22 NOTE — Telephone Encounter (Signed)
-----   Message from Janora Norlander, DO sent at 07/22/2015  8:26 AM EST ----- Please call in patient's Tramadol #90, no refills. Take 0.5-1 tablet (25-50mg ) ONCE daily as needed for pain.

## 2015-07-22 NOTE — Telephone Encounter (Signed)
Rx phoned in. Zimmerman Rumple, Nickolis Diel D, CMA  

## 2015-08-07 DIAGNOSIS — L603 Nail dystrophy: Secondary | ICD-10-CM | POA: Diagnosis not present

## 2015-08-07 DIAGNOSIS — I739 Peripheral vascular disease, unspecified: Secondary | ICD-10-CM | POA: Diagnosis not present

## 2015-08-07 DIAGNOSIS — L84 Corns and callosities: Secondary | ICD-10-CM | POA: Diagnosis not present

## 2015-08-11 ENCOUNTER — Encounter: Payer: Self-pay | Admitting: Family Medicine

## 2015-08-11 ENCOUNTER — Ambulatory Visit (INDEPENDENT_AMBULATORY_CARE_PROVIDER_SITE_OTHER): Payer: Medicare Other | Admitting: Family Medicine

## 2015-08-11 VITALS — BP 117/73 | HR 87 | Temp 97.5°F | Ht 62.0 in | Wt 104.3 lb

## 2015-08-11 DIAGNOSIS — M79605 Pain in left leg: Secondary | ICD-10-CM

## 2015-08-11 DIAGNOSIS — J3089 Other allergic rhinitis: Secondary | ICD-10-CM

## 2015-08-11 MED ORDER — OLOPATADINE HCL 0.1 % OP SOLN: 1.0000 [drp] | Freq: Two times a day (BID) | OPHTHALMIC | Status: DC | PRN

## 2015-08-11 NOTE — Patient Instructions (Signed)
I have sent in your medications.  I have given the prior auth forms to [place].  She will work on Tax adviser approved for you.  Hay Fever  Hay fever is a type of allergy that people have to things like grass, animals, or pollen from plants and flowers. It cannot be passed from one person to another. You cannot cure hay fever, but there are things that may help relieve your problems (symptoms). HOME CARE  Avoid the things that may be causing your problems.  Take all medicine as told by your doctor. GET HELP RIGHT AWAY IF:  You have asthma, a cough, and you start making whistling sounds when breathing (wheezing).  Your tongue or lips are puffy (swollen).  You have trouble breathing.  You feel lightheaded or like you will pass out (faint).  You have a fever.  Your problems are getting worse and your medicine is not helping.  Your treatment was working, but your problems have come back.  You are stuffed up (congested) and have pressure in your face.  You have a headache.  You have cold sweats. MAKE SURE YOU:  Understand these instructions.  Will watch your condition.  Will get help right away if you are not doing well or get worse.   This information is not intended to replace advice given to you by your health care provider. Make sure you discuss any questions you have with your health care provider.   Document Released: 09/15/2010 Document Revised: 08/08/2011 Document Reviewed: 11/26/2014 Elsevier Interactive Patient Education [employer].

## 2015-08-11 NOTE — Progress Notes (Signed)
    Subjective: CC: allergies HPI: Becky Leach is a 82 y.o. female presenting to clinic today for follow up. Concerns today include:  1. Allergies Well controlled with Clarinex.  Has not been able to use Flonase regularly over the last few weeks due to son's illness.  Apparently, insurance requiring prior auth for medication.  Will send information to [facility] for completion.  No cough, congestion.  Occ sneezing.  2. Left lower extremity pain She notes that she is doing well.  Pain well controlled with once daily Tramadol.  Her son reports that she has not gotten her injection because he has had many medical issues requiring attention.  Uses cane.  No falls, constipation.  Social History Reviewed: non smoker. FamHx and MedHx reviewed.  Please see EMR.  ROS: Per HPI  Objective: Office vital signs reviewed. BP 117/73 mmHg  Pulse 87  Temp(Src) 97.5 F (36.4 C) (Oral)  Ht 5\' 2"  (1.575 m)  Wt 104 lb 4.8 oz (47.31 kg)  BMI 19.07 kg/m2  Physical Examination:  General: Awake, alert, thin, elderly female, No acute distress HEENT: Normal    Neck: No masses palpated. No lymphadenopathy    Ears: Tympanic membranes intact, normal light reflex, no erythema, no bulging    Eyes: PERRLA, EOMI, mild conjunctival injection, no purulence or tearing    Nose: nasal turbinates moist, moderately edematous, no erythema    Throat: moist mucus membranes, no erythema Cardio: regular rate and rhythm, S1S2 heard, no murmurs appreciated MSK: slow gait and  Normal station, uses cane for ambulation  Assessment/ Plan: 82 y.o. female   1. Other allergic rhinitis. Had been well controlled on Clarinex.  This requires PA.  Patient did not tolerate Zyrtec because of excessive sedation.  Clarinex worked equally to [employer] per patient. - PA paperwork placed in Tamika's box - Patanol rx'd to be used PRN allergic conjunctivitis.  Discussed with son that Patanol/ Pataday use can lead to rebound irritation if used  for >5 days at a time. - Continue Flonase, Clarinex.  2. Pain of left lower extremity. Still recommend injection over the continued use of tramadol in this elderly lady. Son seems to be focused on his own health at the moment, which is why patient has not been able to have this done. - Controlled on Tramadol - No refills needed until the end of May for this medication. - Follow up in May/ June.  Janora Norlander, DO PGY-2, [place]

## 2015-08-18 ENCOUNTER — Telehealth: Payer: Self-pay | Admitting: *Deleted

## 2015-08-18 NOTE — Telephone Encounter (Signed)
Received letter from [employer] that prior authorization is required for Desloratadine. Medication is non-preferred; patient has tried and failed cetirizine and levocetirizine due to sedation.  PA completed online at [email or web address].  PA could take 24-72 to complete.  Derl Barrow, RN

## 2015-08-24 NOTE — Telephone Encounter (Signed)
PA denied for Desloratadine via [facility].  Patient must tried and failed Azelastine, Flunisolide and Triamcinolone spray.  Reference number: [id number].  Derl Barrow, RN

## 2015-10-14 DIAGNOSIS — M539 Dorsopathy, unspecified: Secondary | ICD-10-CM | POA: Diagnosis not present

## 2015-10-23 DIAGNOSIS — L603 Nail dystrophy: Secondary | ICD-10-CM | POA: Diagnosis not present

## 2015-10-23 DIAGNOSIS — I739 Peripheral vascular disease, unspecified: Secondary | ICD-10-CM | POA: Diagnosis not present

## 2015-10-23 DIAGNOSIS — L84 Corns and callosities: Secondary | ICD-10-CM | POA: Diagnosis not present

## 2015-11-03 DIAGNOSIS — M4807 Spinal stenosis, lumbosacral region: Secondary | ICD-10-CM | POA: Diagnosis not present

## 2015-11-03 DIAGNOSIS — M5416 Radiculopathy, lumbar region: Secondary | ICD-10-CM | POA: Diagnosis not present

## 2015-11-10 ENCOUNTER — Telehealth: Payer: Self-pay | Admitting: *Deleted

## 2015-11-10 NOTE — Telephone Encounter (Signed)
Called to discuss scheduling AWV. Patient requested for a call back this afternoon to discuss with son. Will call back after 2:30. Velora Heckler, RN

## 2015-11-16 ENCOUNTER — Ambulatory Visit (INDEPENDENT_AMBULATORY_CARE_PROVIDER_SITE_OTHER): Payer: Medicare Other | Admitting: Family Medicine

## 2015-11-16 VITALS — BP 123/78 | HR 81 | Temp 97.5°F | Ht 62.0 in | Wt 102.8 lb

## 2015-11-16 DIAGNOSIS — M79605 Pain in left leg: Secondary | ICD-10-CM | POA: Diagnosis not present

## 2015-11-16 DIAGNOSIS — Z780 Asymptomatic menopausal state: Secondary | ICD-10-CM | POA: Diagnosis not present

## 2015-11-16 DIAGNOSIS — M150 Primary generalized (osteo)arthritis: Secondary | ICD-10-CM

## 2015-11-16 DIAGNOSIS — M159 Polyosteoarthritis, unspecified: Secondary | ICD-10-CM

## 2015-11-16 MED ORDER — LORATADINE 10 MG PO TABS: 10.0000 mg | ORAL_TABLET | Freq: Every day | ORAL | Status: DC

## 2015-11-16 MED ORDER — DICLOFENAC SODIUM 1 % TD GEL: 2.0000 g | Freq: Four times a day (QID) | TRANSDERMAL | Status: DC | PRN

## 2015-11-16 NOTE — Patient Instructions (Addendum)
I think that we should wean off of Tramadol now that you have had your epidural injection.  I have sent in Voltaren gel that you may apply up to 4 times daily to affected joints.  You may also use Tylenol as needed for arthritis pain.  Osteoarthritis Osteoarthritis is a disease that causes soreness and inflammation of a joint. It occurs when the cartilage at the affected joint wears down. Cartilage acts as a cushion, covering the ends of bones where they meet to form a joint. Osteoarthritis is the most common form of arthritis. It often occurs in older people. The joints affected most often by this condition include those in the:  Ends of the fingers.  Thumbs.  Neck.  Lower back.  Knees.  Hips. CAUSES  Over time, the cartilage that covers the ends of bones begins to wear away. This causes bone to rub on bone, producing pain and stiffness in the affected joints.  RISK FACTORS Certain factors can increase your chances of having osteoarthritis, including:  Older age.  Excessive body weight.  Overuse of joints.  Previous joint injury. SIGNS AND SYMPTOMS   Pain, swelling, and stiffness in the joint.  Over time, the joint may lose its normal shape.  Small deposits of bone (osteophytes) may grow on the edges of the joint.  Bits of bone or cartilage can break off and float inside the joint space. This may cause more pain and damage. DIAGNOSIS  Your health care provider will do a physical exam and ask about your symptoms. Various tests may be ordered, such as:  X-rays of the affected joint.  Blood tests to rule out other types of arthritis. Additional tests may be used to diagnose your condition. TREATMENT  Goals of treatment are to control pain and improve joint function. Treatment plans may include:  A prescribed exercise program that allows for rest and joint relief.  A weight control plan.  Pain relief techniques, such as:  Properly applied heat and cold.  Electric  pulses delivered to nerve endings under the skin (transcutaneous electrical nerve stimulation [TENS]).  Massage.  Certain nutritional supplements.  Medicines to control pain, such as:  Acetaminophen.  Nonsteroidal anti-inflammatory drugs (NSAIDs), such as naproxen.  Narcotic or central-acting agents, such as tramadol.  Corticosteroids. These can be given orally or as an injection.  Surgery to reposition the bones and relieve pain (osteotomy) or to remove loose pieces of bone and cartilage. Joint replacement may be needed in advanced states of osteoarthritis. HOME CARE INSTRUCTIONS   Take medicines only as directed by your health care provider.  Maintain a healthy weight. Follow your health care provider's instructions for weight control. This may include dietary instructions.  Exercise as directed. Your health care provider can recommend specific types of exercise. These may include:  Strengthening exercises. These are done to strengthen the muscles that support joints affected by arthritis. They can be performed with weights or with exercise bands to add resistance.  Aerobic activities. These are exercises, such as brisk walking or low-impact aerobics, that get your heart pumping.  Range-of-motion activities. These keep your joints limber.  Balance and agility exercises. These help you maintain daily living skills.  Rest your affected joints as directed by your health care provider.  Keep all follow-up visits as directed by your health care provider. SEEK MEDICAL CARE IF:   Your skin turns red.  You develop a rash in addition to your joint pain.  You have worsening joint pain.  You  have a fever along with joint or muscle aches. SEEK IMMEDIATE MEDICAL CARE IF:  You have a significant loss of weight or appetite.  You have night sweats. [place] of Arthritis and Musculoskeletal and Skin Diseases: www.niams.SouthExposed.es  [employer] on Aging: http://kim-miller.com/  American College of Rheumatology: www.rheumatology.org   This information is not intended to replace advice given to you by your health care provider. Make sure you discuss any questions you have with your health care provider.   Document Released: 05/16/2005 Document Revised: 06/06/2014 Document Reviewed: 01/21/2013 Elsevier Interactive Patient Education [employer].

## 2015-11-16 NOTE — Progress Notes (Signed)
    Subjective: CC:LLE pain HPI: Becky Leach is a 82 y.o. female presenting to clinic today for follow up office visit. Concerns today include:  1. LLE pain/ arthritis She notes that she had an epidural injection done at [facility] done 11/03/15.  She notes that her LLE pain is improved.  She continues to have arthritis related pain.  Knee pain on left side has improved some too.  She continues to take Tramadol for pain in her left toes and knee.  She reports that pain is relieved by aspercream.    Social History Reviewed: non smoker. FamHx and MedHx reviewed.  Please see EMR. Health Maintenance: Dexa  ROS: Per HPI  Objective: Office vital signs reviewed. BP 123/78 mmHg  Pulse 81  Temp(Src) 97.5 F (36.4 C) (Oral)  Ht 5\' 2"  (1.575 m)  Wt 102 lb 12.8 oz (46.63 kg)  BMI 18.80 kg/m2  Physical Examination:  General: Awake, alert, thin elderly female, No acute distress Cardio: regular rate Pulm: normal WOB on room air MSK: uses cane for ambulation, no joint deformity appreciated in hands or feet.  Assessment/ Plan: 82 y.o. female   1. Primary osteoarthritis involving multiple joints.  Discussed weaning off of Tramadol. - Tylenol prn arthritic pain - diclofenac sodium (VOLTAREN) 1 % GEL; Apply 2 g topically 4 (four) times daily as needed.  Dispense: 200 g; Refill: 5  2. Pain of left lower extremity - Doing well after epidural injection - Continue to avoid sedating medications - Use cane for ambulation  3. Postmenopausal estrogen deficiency - DG Bone Density; Future - Son to schedule   Janora Norlander, DO PGY-2, [place]

## 2015-11-25 ENCOUNTER — Telehealth: Payer: Self-pay | Admitting: *Deleted

## 2015-11-25 NOTE — Telephone Encounter (Signed)
Prior Authorization received from [facility] for Diclofenac 1% gel.  PA form placed in provider box for completion. Derl Barrow, RN

## 2015-11-30 NOTE — Telephone Encounter (Signed)
PA for diclofenac 1% gel was approved via [employer] until 05/29/16.  Reference number: [id number]. Derl Barrow, RN

## 2015-11-30 NOTE — Telephone Encounter (Signed)
I will take care of this today and place it in Tamika's box.

## 2015-11-30 NOTE — Telephone Encounter (Signed)
PA form faxed to [facility] for review.  Review process could take 24-72 hours to complete.  Martin, Tamika L, RN  

## 2015-12-02 ENCOUNTER — Other Ambulatory Visit: Payer: Self-pay | Admitting: *Deleted

## 2015-12-02 DIAGNOSIS — M159 Polyosteoarthritis, unspecified: Secondary | ICD-10-CM

## 2015-12-02 DIAGNOSIS — M150 Primary generalized (osteo)arthritis: Principal | ICD-10-CM

## 2015-12-03 ENCOUNTER — Other Ambulatory Visit: Payer: Self-pay | Admitting: Family Medicine

## 2015-12-03 DIAGNOSIS — M159 Polyosteoarthritis, unspecified: Secondary | ICD-10-CM

## 2015-12-03 DIAGNOSIS — M150 Primary generalized (osteo)arthritis: Principal | ICD-10-CM

## 2015-12-03 MED ORDER — DICLOFENAC SODIUM 1 % TD GEL: 2.0000 g | Freq: Four times a day (QID) | TRANSDERMAL | Status: DC | PRN

## 2015-12-03 NOTE — Telephone Encounter (Addendum)
Patient's son called and also received fax from [facility] stating that the Diclofenac100 gram is only lasting 12 days.  Per [facility] they did not receive the Rx sent on 11/16/15 for quantity of 200 gram.  Patient would need quantity of 3 tubes to last 30 days.  The Rx on file with [facility] is for 100 gm.  Please advise.  Derl Barrow, RN

## 2016-01-21 DIAGNOSIS — L603 Nail dystrophy: Secondary | ICD-10-CM | POA: Diagnosis not present

## 2016-01-21 DIAGNOSIS — I739 Peripheral vascular disease, unspecified: Secondary | ICD-10-CM | POA: Diagnosis not present

## 2016-01-21 DIAGNOSIS — L84 Corns and callosities: Secondary | ICD-10-CM | POA: Diagnosis not present

## 2016-02-10 ENCOUNTER — Ambulatory Visit (INDEPENDENT_AMBULATORY_CARE_PROVIDER_SITE_OTHER): Payer: Medicare Other | Admitting: Family Medicine

## 2016-02-10 ENCOUNTER — Encounter: Payer: Self-pay | Admitting: Family Medicine

## 2016-02-10 VITALS — BP 114/74 | HR 100 | Temp 97.7°F | Wt 105.0 lb

## 2016-02-10 DIAGNOSIS — I10 Essential (primary) hypertension: Secondary | ICD-10-CM | POA: Diagnosis present

## 2016-02-10 DIAGNOSIS — K130 Diseases of lips: Secondary | ICD-10-CM | POA: Diagnosis not present

## 2016-02-10 DIAGNOSIS — M79605 Pain in left leg: Secondary | ICD-10-CM

## 2016-02-10 DIAGNOSIS — Z23 Encounter for immunization: Secondary | ICD-10-CM | POA: Diagnosis not present

## 2016-02-10 MED ORDER — TRAMADOL HCL 50 MG PO TABS: ORAL_TABLET | ORAL | 0 refills | Status: DC

## 2016-02-10 MED ORDER — AMLODIPINE BESYLATE 5 MG PO TABS: 5.0000 mg | ORAL_TABLET | Freq: Every day | ORAL | 6 refills | Status: DC

## 2016-02-10 NOTE — Progress Notes (Signed)
    Subjective: CC: HTN HPI: Becky Leach is a 82 y.o. female presenting to clinic today for office visit. Concerns today include:  1. Hypertension Blood pressure today: 114/74 Meds: Compliant with Norvasc 5mg  daily Side effects: none ROS: Denies headache, dizziness, visual changes, nausea, vomiting, chest pain, abdominal pain or shortness of breath.  2. Chapped lips Patient reports that she is using chap stick for her lips but that they peel a lot.  Denies bleeding of lips  3. LE pain Pain well controlled with Voltaren gel.  She reports that she also continues to take Tramadol once daily.  She occ has to take a morning pill.  She denies falls, excessive sedation.  Social History Reviewed: non smoker. FamHx and MedHx reviewed.  Please see EMR. Health Maintenance: flu shot  ROS: Per HPI  Objective: Office vital signs reviewed. BP 114/74   Pulse 100   Temp 97.7 F (36.5 C) (Oral)   Wt 105 lb (47.6 kg)   SpO2 98%   BMI 19.20 kg/m   Physical Examination:  General: Awake, alert, well nourished, No acute distress Cardio: regular rate and rhythm, S1S2 heard, no murmurs appreciated Pulm: clear to auscultation bilaterally, no wheezes, rhonchi or rales, normal WOB on room air MSK: uses cane for ambulation Skin: dry, intact, no rashes; linear hyperpigmentation appreciated along right cheek  Assessment/ Plan: 82 y.o. female   1. Benign essential HTN.  BP well controlled.  Not orthostatic. - amLODipine (NORVASC) 5 MG tablet; Take 1 tablet (5 mg total) by mouth daily.  Dispense: 90 tablet; Refill: 6  2. Encounter for immunization - Flu Vaccine QUAD 36+ mos IM  3. Pain of left lower extremity - Continue Voltaren. - Will continue to wean off Ultram. - traMADol (ULTRAM) 50 MG tablet; Take 0.5-1 tablet (25-50mg ) ONCE daily as needed for pain.  Dispense: 90 tablet; Refill: 0  4. Chapped lips - Recommended using barrier topical, vaseline - Continue to hydrate well - Avoid  licking lips  Follow up in 6 months or sooner if needed.  Janora Norlander, DO PGY-3, [facility]

## 2016-05-05 ENCOUNTER — Ambulatory Visit (INDEPENDENT_AMBULATORY_CARE_PROVIDER_SITE_OTHER): Payer: Medicare Other | Admitting: Family Medicine

## 2016-05-05 ENCOUNTER — Encounter: Payer: Self-pay | Admitting: Family Medicine

## 2016-05-05 VITALS — BP 100/60 | HR 88 | Temp 97.5°F | Ht 62.0 in | Wt 107.0 lb

## 2016-05-05 DIAGNOSIS — Z0000 Encounter for general adult medical examination without abnormal findings: Secondary | ICD-10-CM

## 2016-05-05 DIAGNOSIS — M150 Primary generalized (osteo)arthritis: Secondary | ICD-10-CM | POA: Diagnosis not present

## 2016-05-05 DIAGNOSIS — M79605 Pain in left leg: Secondary | ICD-10-CM

## 2016-05-05 DIAGNOSIS — M159 Polyosteoarthritis, unspecified: Secondary | ICD-10-CM

## 2016-05-05 DIAGNOSIS — I10 Essential (primary) hypertension: Secondary | ICD-10-CM

## 2016-05-05 MED ORDER — VARICELLA-ZOSTER IMMUNE GLOB 125 UNIT/1.2ML IM SOLN: 1.2000 mL | Freq: Once | INTRAMUSCULAR | 0 refills | Status: AC

## 2016-05-05 MED ORDER — TRAMADOL HCL 50 MG PO TABS: ORAL_TABLET | ORAL | 0 refills | Status: DC

## 2016-05-05 MED ORDER — AMLODIPINE BESYLATE 5 MG PO TABS: 2.5000 mg | ORAL_TABLET | Freq: Every day | ORAL | 6 refills | Status: DC

## 2016-05-05 MED ORDER — TETANUS-DIPHTH-ACELL PERTUSSIS 5-2.5-18.5 LF-MCG/0.5 IM SUSP: 0.5000 mL | Freq: Once | INTRAMUSCULAR | 0 refills | Status: AC

## 2016-05-05 MED ORDER — DICLOFENAC SODIUM 1 % TD GEL: 2.0000 g | Freq: Four times a day (QID) | TRANSDERMAL | 5 refills | Status: DC | PRN

## 2016-05-05 NOTE — Assessment & Plan Note (Signed)
BP somewhat on the lower side today.  She is asx.  Given age, will decrease Norvasc to 2.5mg  daily.  I actually prefer to discontinue completely but both patient and son are resistant to stopping medication completely at this time.  Recommend daily BP monitoring.  If BP persistently below 120/70, recommend discontinuation of Norvasc and continued monitoring of BP.  Goal <150/90.  This was discussed at length with son and patient.

## 2016-05-05 NOTE — Assessment & Plan Note (Signed)
Doing well on tramadol 50mg  once daily.  She has had no issues, no falls and no adverse side effects.  Plan is still to attempt to discontinue fully at some point.  She and her son understand the risks of this medication given her age.  At this time benefit outweighing risk, so will continue.  Rx provided for 3 month supply today.  [place] narcotic database reviewed.  No red flags.

## 2016-05-05 NOTE — Progress Notes (Signed)
    Subjective: CC: HTN HPI: Becky Leach is a 82 y.o. female presenting to clinic today for follow up. Concerns today include:  1. Hypertension Blood pressure today: 100/60 Meds: Compliant with Norvasc 5mg  Side effects: none ROS: Denies headache, dizziness, visual changes, nausea, vomiting, chest pain, abdominal pain or shortness of breath.  2. Health care maintenance Son inquires about vaccination status.  He notes that he is willing to attempt to see if TDap and Shingles vaccine is covered at pharmacy but will likely not have administered to his mother if expensive.  Additionally, he reports that he is expecting to undergo surgery soon so her DEXA will have to wait until he is able to bring her.  3. Chronic pain Patient reports that her pain in her LLE is well controlled with Tramadol.  She notes that she has been weaning off of it as able.  She is currently taking 1 tablet daily without adverse side effects.  Denies constipation, sedation, SOB, dizziness.  Social History Reviewed: non smoker. FamHx and MedHx reviewed.  Please see EMR. Health Maintenance: TDap, Shingles  ROS: Per HPI  Objective: Office vital signs reviewed. BP 100/60 (BP Location: Right Arm, Patient Position: Sitting, Cuff Size: Normal)   Pulse 88   Temp 97.5 F (36.4 C) (Oral)   Ht 5\' 2"  (1.575 m)   Wt 107 lb (48.5 kg)   SpO2 92%   BMI 19.57 kg/m   Physical Examination:  General: Awake, alert, well appearing elderly female, No acute distress Cardio: regular rate and rhythm, S1S2 heard, no murmurs appreciated Pulm: clear to auscultation bilaterally, no wheezes, rhonchi or rales, normal WOB on room air Extremities: warm, well perfused, No edema, cyanosis or clubbing; +2 pulses bilaterally MSK: Normal gait and station  Assessment/ Plan: 82 y.o. female   Pain of left lower extremity Doing well on tramadol 50mg  once daily.  She has had no issues, no falls and no adverse side effects.  Plan is still to  attempt to discontinue fully at some point.  She and her son understand the risks of this medication given her age.  At this time benefit outweighing risk, so will continue.  Rx provided for 3 month supply today.  [place] narcotic database reviewed.  No red flags.  Benign essential HTN BP somewhat on the lower side today.  She is asx.  Given age, will decrease Norvasc to 2.5mg  daily.  I actually prefer to discontinue completely but both patient and son are resistant to stopping medication completely at this time.  Recommend daily BP monitoring.  If BP persistently below 120/70, recommend discontinuation of Norvasc and continued monitoring of BP.  Goal <150/90.  This was discussed at length with son and patient.  Preventative health care Recommend DEXA screening.  TDap and Zostavax rx's provided today.  Patient is otherwise UTD with current preventative healthcare guidelines for age.  Follow up in 3 months or sooner if needed.   Becky Norlander, DO PGY-3, [facility]

## 2016-05-05 NOTE — Patient Instructions (Signed)
Follow up in 3-6 months or sooner if needed.  Remember to schedule your Dexa scan.    Cut your Amlodipine in 1/2.  I want you to monitor your blood pressure and keep a log.  If you are still having blood pressures that are less than 120/70's, I want you to discontinue the medication completely.  You are allowed to have a blood pressure up to 150/90 (this is considered the normal threshold for your age.)  Hypotension As your heart beats, it forces blood through your body. This force is called blood pressure. If you have hypotension, you have low blood pressure. When your blood pressure is too low, you may not get enough blood to your brain. You may feel weak, feel light-headed, have a fast heartbeat, or even pass out (faint). Follow these instructions at home: Eating and drinking  Drink enough fluids to keep your pee (urine) clear or pale yellow.  Eat a healthy diet, and follow instructions from your doctor about eating or drinking restrictions. A healthy diet includes:  Fresh fruits and vegetables.  Whole grains.  Low-fat (lean) meats.  Low-fat dairy products.  Eat extra salt only as told. Do not add extra salt to your diet unless your doctor tells you to.  Eat small meals often.  Avoid standing up quickly after you eat. Medicines  Take over-the-counter and prescription medicines only as told by your doctor.  Follow instructions from your doctor about changing how much you take (the dosage) of your medicines, if this applies.  Do not stop or change your medicine on your own. General instructions  Wear compression stockings as told by your doctor.  Get up slowly from lying down or sitting.  Avoid hot showers and a lot of heat as told by your doctor.  Return to your normal activities as told by your doctor. Ask what activities are safe for you.  Do not use any products that contain nicotine or tobacco, such as cigarettes and e-cigarettes. If you need help quitting, ask your  doctor.  Keep all follow-up visits as told by your doctor. This is important. Contact a doctor if:  You throw up (vomit).  You have watery poop (diarrhea).  You have a fever for more than 2-3 days.  You feel more thirsty than normal.  You feel weak and tired. Get help right away if:  You have chest pain.  You have a fast or irregular heartbeat.  You lose feeling (get numbness) in any part of your body.  You cannot move your arms or your legs.  You have trouble talking.  You get sweaty or feel light-headed.  You faint.  You have trouble breathing.  You have trouble staying awake.  You feel confused. This information is not intended to replace advice given to you by your health care provider. Make sure you discuss any questions you have with your health care provider. Document Released: 08/10/2009 Document Revised: 02/02/2016 Document Reviewed: 02/02/2016 Elsevier Interactive Patient Education  2017 [employer].

## 2016-05-11 DIAGNOSIS — L84 Corns and callosities: Secondary | ICD-10-CM | POA: Diagnosis not present

## 2016-05-11 DIAGNOSIS — I739 Peripheral vascular disease, unspecified: Secondary | ICD-10-CM | POA: Diagnosis not present

## 2016-05-11 DIAGNOSIS — L603 Nail dystrophy: Secondary | ICD-10-CM | POA: Diagnosis not present

## 2016-06-29 ENCOUNTER — Telehealth: Payer: Self-pay | Admitting: *Deleted

## 2016-06-29 NOTE — Telephone Encounter (Signed)
Prior Authorization received from [facility] for diclofenac sodium. PA completed online at www.covermymeds.com. PA approved via [employer] until 05/29/17.  Reference number: [id number]. Derl Barrow, RN

## 2016-08-04 DIAGNOSIS — I739 Peripheral vascular disease, unspecified: Secondary | ICD-10-CM | POA: Diagnosis not present

## 2016-08-04 DIAGNOSIS — L603 Nail dystrophy: Secondary | ICD-10-CM | POA: Diagnosis not present

## 2016-08-17 ENCOUNTER — Other Ambulatory Visit: Payer: Self-pay | Admitting: *Deleted

## 2016-08-17 DIAGNOSIS — M79605 Pain in left leg: Secondary | ICD-10-CM

## 2016-08-18 ENCOUNTER — Ambulatory Visit (INDEPENDENT_AMBULATORY_CARE_PROVIDER_SITE_OTHER): Payer: Medicare Other | Admitting: Family Medicine

## 2016-08-18 ENCOUNTER — Encounter: Payer: Self-pay | Admitting: Family Medicine

## 2016-08-18 VITALS — BP 132/84 | HR 74 | Temp 97.9°F | Ht 62.0 in | Wt 106.4 lb

## 2016-08-18 DIAGNOSIS — R21 Rash and other nonspecific skin eruption: Secondary | ICD-10-CM

## 2016-08-18 DIAGNOSIS — M79605 Pain in left leg: Secondary | ICD-10-CM | POA: Diagnosis not present

## 2016-08-18 DIAGNOSIS — I10 Essential (primary) hypertension: Secondary | ICD-10-CM | POA: Diagnosis present

## 2016-08-18 MED ORDER — TRAMADOL HCL 50 MG PO TABS: ORAL_TABLET | ORAL | 1 refills | Status: DC

## 2016-08-18 NOTE — Progress Notes (Signed)
    Subjective: CC: HTN HPI: Becky Leach is a 83 y.o. female presenting to clinic today for:  1. Hypertension Blood pressure today: 150/88, 132/84 Meds: Was using Norvasc 2.5mg  daily but BPs were in 120/80s so dc'd medication as recommended last visit.  Home BPs have been running 120/80s.  ROS: Denies headache, dizziness, visual changes, nausea, vomiting, chest pain, abdominal pain or shortness of breath.  2. Skin itching/ LE pain Patient reports that she has had itchy skin on her LE.  She wonders if this is related to use of Voltaren gel she uses for her LE pain, which is stable on Tramadol.  No oral itching, oral swelling, nausea, vomiting.  Social Hx reviewed: non smoker. Resides with son.  MedHx, medications and allergies reviewed.  Please see EMR. Health Maintenance: Dexa, TDap  ROS: Per HPI  Objective: Office vital signs reviewed. BP 132/84   Pulse 74   Temp 97.9 F (36.6 C) (Oral)   Ht 5\' 2"  (1.575 m)   Wt 106 lb 6.4 oz (48.3 kg)   SpO2 92%   BMI 19.46 kg/m   Physical Examination:  General: Awake, alert, well appearing elderly female accompanied to visit by son, No acute distress HEENT: Normal    Throat: moist mucus membranes, no erythema,  Airway is patent Cardio: regular rate and rhythm, S1S2 heard, no murmurs appreciated Pulm: clear to auscultation bilaterally, no wheezes, rhonchi or rales; normal work of breathing on room air Extremities: warm, well perfused, No edema, cyanosis or clubbing; +2 pulses bilaterally Skin: dry; intact; multiple varicose veins appreciated LE, senile purpura appreciated. No erythema, exudate, bleeding, skin breakdown seen.  Assessment/ Plan: 83 y.o. female   1. Benign essential HTN, controlled - Continue to manage BP with diet only at this time.  She is at goal <150/90 for age. - Monitor BP at home.  If >150/90, ok to start Norvasc 2.5mg  daily again.  2. Pain of left lower extremity, stable.  [place] narcotic database reviewed.  No  red flags. - traMADol (ULTRAM) 50 MG tablet; Take 0.5-1 tablet (25-50mg ) ONCE daily as needed for pain.  Dispense: 90 tablet; Refill: 1  3. Rash and nonspecific skin eruption No active rash on today's exam. - Recommended trial off Voltaren for now.  See if symptoms resolve - If not, will look for other reasons she is having itching.  Additionally, son had questions regarding screening.  We discussed that she no longer has to have colonoscopy screening done.  He wishes to have DEXA performed.  He is to schedule this at his convenience.   Follow up prn. Janora Norlander, DO PGY-3, [facility]

## 2016-08-18 NOTE — Telephone Encounter (Signed)
[place] narcotic database reviewed.  No red flags.  Patient uses medication sparingly for LE pain.  Ok to refill #90 w/1 additional refill.  Please call this into her pharmacy.

## 2016-08-18 NOTE — Patient Instructions (Signed)
Follow up in 3 months or sooner if needed for blood pressure.  I recommend that you continue to monitor blood pressure at home.

## 2016-08-19 NOTE — Telephone Encounter (Signed)
[facility] [address]. Ottis Stain, CMA

## 2016-11-01 ENCOUNTER — Telehealth (INDEPENDENT_AMBULATORY_CARE_PROVIDER_SITE_OTHER): Payer: Self-pay

## 2016-11-01 NOTE — Telephone Encounter (Signed)
Yes ok 

## 2016-11-01 NOTE — Telephone Encounter (Signed)
Per pts son pt is needing another injection. Had Lt L4 TF on 11/03/15 and he states she had great relief for over 6 months. Ok to repeat?

## 2016-11-01 NOTE — Telephone Encounter (Signed)
Pt scheduled for 11/15/16

## 2016-11-10 DIAGNOSIS — M65872 Other synovitis and tenosynovitis, left ankle and foot: Secondary | ICD-10-CM | POA: Diagnosis not present

## 2016-11-10 DIAGNOSIS — M722 Plantar fascial fibromatosis: Secondary | ICD-10-CM | POA: Diagnosis not present

## 2016-11-10 DIAGNOSIS — L603 Nail dystrophy: Secondary | ICD-10-CM | POA: Diagnosis not present

## 2016-11-10 DIAGNOSIS — L84 Corns and callosities: Secondary | ICD-10-CM | POA: Diagnosis not present

## 2016-11-10 DIAGNOSIS — I739 Peripheral vascular disease, unspecified: Secondary | ICD-10-CM | POA: Diagnosis not present

## 2016-11-15 ENCOUNTER — Ambulatory Visit (INDEPENDENT_AMBULATORY_CARE_PROVIDER_SITE_OTHER): Payer: Medicare Other | Admitting: Physical Medicine and Rehabilitation

## 2016-11-15 ENCOUNTER — Ambulatory Visit (INDEPENDENT_AMBULATORY_CARE_PROVIDER_SITE_OTHER): Payer: Medicare Other

## 2016-11-15 ENCOUNTER — Encounter (INDEPENDENT_AMBULATORY_CARE_PROVIDER_SITE_OTHER): Payer: Self-pay | Admitting: Physical Medicine and Rehabilitation

## 2016-11-15 VITALS — BP 137/90 | HR 99

## 2016-11-15 DIAGNOSIS — M5416 Radiculopathy, lumbar region: Secondary | ICD-10-CM

## 2016-11-15 DIAGNOSIS — M48062 Spinal stenosis, lumbar region with neurogenic claudication: Secondary | ICD-10-CM

## 2016-11-15 MED ORDER — BETAMETHASONE SOD PHOS & ACET 6 (3-3) MG/ML IJ SUSP
12.0000 mg | Freq: Once | INTRAMUSCULAR | Status: AC
  Administered 2016-11-15: 12 mg

## 2016-11-15 MED ORDER — LIDOCAINE HCL (PF) 1 % IJ SOLN
2.0000 mL | Freq: Once | INTRAMUSCULAR | Status: AC
  Administered 2016-11-15: 2 mL

## 2016-11-15 NOTE — Patient Instructions (Signed)

## 2016-11-15 NOTE — Progress Notes (Deleted)
Pain across low back and down both legs to knees. Left side is worse. Occasionally numbness and tingling. Weakness in leg at times.

## 2016-11-18 NOTE — Progress Notes (Signed)
Becky Leach - 83 y.o. female MRN [id number]  Date of birth: [birth year]  Office Visit Note: Visit Date: 11/15/2016 PCP: Janora Norlander, DO Referred by: Janora Norlander, DO  Subjective: Chief Complaint  Patient presents with  . Lower Back - Pain   HPI: [name] is a very pleasant 83 year old female who is present with her son who we also treated in the office and provide some of the history. We completed a left L4 transforaminal epidural steroid injection last year with good relief of her left-sided complaints for almost 6 months. This was done in June 2017. She has had imaging showing some stenosis and lateral recess stenosis particularly at the L4-5 region. She has not had recent MRI imaging. She's had no new falls or injury. She's had worsening symptoms over the last month or so. She gets symptoms on both sides but it's worse on the left than right. When we asked her she says is really 90% left-sided. She has not had any focal weakness. She does ambulate with a cane. She has not had any bowel or bladder symptoms or other red flag symptoms. She's had no fevers chills or night sweats. No unintended weight loss.    ROS Otherwise per HPI.  Assessment & Plan: Visit Diagnoses:  1. Lumbar radiculopathy   2. Spinal stenosis of lumbar region with neurogenic claudication     Plan: Findings:  Diagnostic and therapeutic left L4 transforaminal epidural steroid injection. Depending on the relief she gets we will consider repeat MRI. Otherwise her medications are adequate.    Meds & Orders:  Meds ordered this encounter  Medications  . lidocaine (PF) (XYLOCAINE) 1 % injection 2 mL  . betamethasone acetate-betamethasone sodium phosphate (CELESTONE) injection 12 mg    Orders Placed This Encounter  Procedures  . XR C-ARM NO REPORT  . Epidural Steroid injection    Follow-up: Return if symptoms worsen or fail to improve.   Procedures: No procedures performed  Lumbosacral  Transforaminal Epidural Steroid Injection - Infraneural Approach with Fluoroscopic Guidance  Patient: Becky Leach      Date of Birth: [birth year] MRN: [id number] PCP: Janora Norlander, DO      Visit Date: 11/15/2016   Universal Protocol:     Consent Given By: the patient  Position: PRONE   Additional Comments: Vital signs were monitored before and after the procedure. Patient was prepped and draped in the usual sterile fashion. The correct patient, procedure, and site was verified.   Injection Procedure Details:  Procedure Site One Meds Administered:  Meds ordered this encounter  Medications  . lidocaine (PF) (XYLOCAINE) 1 % injection 2 mL  . betamethasone acetate-betamethasone sodium phosphate (CELESTONE) injection 12 mg      Laterality: Left  Location/Site:  L4-L5  Needle size: 22 G  Needle type: Spinal  Needle Placement: Transforaminal  Findings:  -Contrast Used: 1 mL iohexol 180 mg iodine/mL   -Comments: Excellent flow of contrast along the nerve and into the epidural space.  Procedure Details: After squaring off the end-plates of the desired vertebral level to get a true AP view, the C-arm was obliqued to the painful side so that the superior articulating process is positioned about 1/3 the length of the inferior endplate.  The needle was aimed toward the junction of the superior articular process and the transverse process of the inferior vertebrae. The needle's initial entry is in the lower third of the foramen through Kambin's triangle. The soft tissues overlying this  target were infiltrated with 2-3 ml. of 1% Lidocaine without Epinephrine.  The spinal needle was then inserted and advanced toward the target using a "trajectory" view along the fluoroscope beam.  Under AP and lateral visualization, the needle was advanced so it did not puncture dura and did not traverse medially beyond the 6 o'clock position of the pedicle. Bi-planar projections were used to  confirm position. Aspiration was confirmed to be negative for CSF and/or blood. A 1-2 ml. volume of Isovue-250 was injected and flow of contrast was noted at each level. Radiographs were obtained for documentation purposes.   After attaining the desired flow of contrast documented above, a 0.5 to 1.0 ml test dose of 0.25% Marcaine was injected into each respective transforaminal space.  The patient was observed for 90 seconds post injection.  After no sensory deficits were reported, and normal lower extremity motor function was noted,   the above injectate was administered so that equal amounts of the injectate were placed at each foramen (level) into the transforaminal epidural space.   Additional Comments:  The patient tolerated the procedure well Dressing: Band-Aid    Post-procedure details: Patient was observed during the procedure. Post-procedure instructions were reviewed.  Patient left the clinic in stable condition.   Clinical History: No specialty comments available.  She reports that she has never smoked. She has never used smokeless tobacco. No results for input(s): HGBA1C, LABURIC in the last 8760 hours.  Objective:  VS:  HT:    WT:   BMI:     BP:137/90  HR:99bpm  TEMP: ( )  RESP:92 % Physical Exam  Musculoskeletal:  Patient is slow to rise from a seated position. She does ambulate with a cane. She does have pain with extension of the lumbar spine. No pain over the greater trochanters and good strength distally.    Ortho Exam Imaging: No results found.  Past Medical/Family/Surgical/Social History: Medications & Allergies reviewed per EMR Patient Active Problem List   Diagnosis Date Noted  . Urge urinary incontinence 11/19/2014  . Pain of left lower extremity 08/28/2014  . Allergic rhinitis 02/04/2014  . Hot flashes 12/12/2013  . Spinal stenosis of lumbar region 11/14/2013  . Unspecified constipation 11/14/2013  . Post-menopausal bleeding 12/19/2011  . Benign  essential HTN 12/06/2011   Past Medical History:  Diagnosis Date  . AKI (acute kidney injury) ([place])    due to NSAID  . Arthritis   . Constipation   . Herniated disc   . Hypertension   . Seasonal allergies   . Spinal stenosis   . Stomach ulcer    due to NSAID  . Tachycardia   . UTI (lower urinary tract infection)    Family History  Problem Relation Age of Onset  . Arthritis Mother   . Heart attack Mother    Past Surgical History:  Procedure Laterality Date  . COLONOSCOPY  12/21/2011   Procedure: COLONOSCOPY;  Surgeon: Wonda Horner, MD;  Location: [facility];  Service: Endoscopy;  Laterality: N/A;  . DILATION AND CURETTAGE OF UTERUS    . TONSILLECTOMY AND ADENOIDECTOMY     Social History   Occupational History  . Not on file.   Social History Main Topics  . Smoking status: Never Smoker  . Smokeless tobacco: Never Used  . Alcohol use No  . Drug use: No  . Sexual activity: No

## 2016-11-18 NOTE — Procedures (Signed)
Lumbosacral Transforaminal Epidural Steroid Injection - Infraneural Approach with Fluoroscopic Guidance  Patient: Becky Leach      Date of Birth: Jun 20, 1929 MRN: [id number] PCP: Janora Norlander, DO      Visit Date: 11/15/2016   Universal Protocol:     Consent Given By: the patient  Position: PRONE   Additional Comments: Vital signs were monitored before and after the procedure. Patient was prepped and draped in the usual sterile fashion. The correct patient, procedure, and site was verified.   Injection Procedure Details:  Procedure Site One Meds Administered:  Meds ordered this encounter  Medications  . lidocaine (PF) (XYLOCAINE) 1 % injection 2 mL  . betamethasone acetate-betamethasone sodium phosphate (CELESTONE) injection 12 mg      Laterality: Left  Location/Site:  L4-L5  Needle size: 22 G  Needle type: Spinal  Needle Placement: Transforaminal  Findings:  -Contrast Used: 1 mL iohexol 180 mg iodine/mL   -Comments: Excellent flow of contrast along the nerve and into the epidural space.  Procedure Details: After squaring off the end-plates of the desired vertebral level to get a true AP view, the C-arm was obliqued to the painful side so that the superior articulating process is positioned about 1/3 the length of the inferior endplate.  The needle was aimed toward the junction of the superior articular process and the transverse process of the inferior vertebrae. The needle's initial entry is in the lower third of the foramen through Kambin's triangle. The soft tissues overlying this target were infiltrated with 2-3 ml. of 1% Lidocaine without Epinephrine.  The spinal needle was then inserted and advanced toward the target using a "trajectory" view along the fluoroscope beam.  Under AP and lateral visualization, the needle was advanced so it did not puncture dura and did not traverse medially beyond the 6 o'clock position of the pedicle. Bi-planar projections were  used to confirm position. Aspiration was confirmed to be negative for CSF and/or blood. A 1-2 ml. volume of Isovue-250 was injected and flow of contrast was noted at each level. Radiographs were obtained for documentation purposes.   After attaining the desired flow of contrast documented above, a 0.5 to 1.0 ml test dose of 0.25% Marcaine was injected into each respective transforaminal space.  The patient was observed for 90 seconds post injection.  After no sensory deficits were reported, and normal lower extremity motor function was noted,   the above injectate was administered so that equal amounts of the injectate were placed at each foramen (level) into the transforaminal epidural space.   Additional Comments:  The patient tolerated the procedure well Dressing: Band-Aid    Post-procedure details: Patient was observed during the procedure. Post-procedure instructions were reviewed.  Patient left the clinic in stable condition.

## 2016-11-28 ENCOUNTER — Ambulatory Visit (INDEPENDENT_AMBULATORY_CARE_PROVIDER_SITE_OTHER): Payer: Medicare Other

## 2016-11-28 ENCOUNTER — Ambulatory Visit (INDEPENDENT_AMBULATORY_CARE_PROVIDER_SITE_OTHER): Payer: Self-pay

## 2016-11-28 ENCOUNTER — Ambulatory Visit (INDEPENDENT_AMBULATORY_CARE_PROVIDER_SITE_OTHER): Payer: Medicare Other | Admitting: Orthopaedic Surgery

## 2016-11-28 DIAGNOSIS — M25562 Pain in left knee: Secondary | ICD-10-CM

## 2016-11-28 DIAGNOSIS — M25561 Pain in right knee: Secondary | ICD-10-CM

## 2016-11-28 DIAGNOSIS — G8929 Other chronic pain: Secondary | ICD-10-CM

## 2016-11-28 MED ORDER — METHYLPREDNISOLONE ACETATE 40 MG/ML IJ SUSP
40.0000 mg | INTRAMUSCULAR | Status: AC | PRN
  Administered 2016-11-28: 40 mg via INTRA_ARTICULAR

## 2016-11-28 MED ORDER — LIDOCAINE HCL 1 % IJ SOLN
3.0000 mL | INTRAMUSCULAR | Status: AC | PRN
  Administered 2016-11-28: 3 mL

## 2016-11-28 NOTE — Progress Notes (Signed)
Office Visit Note   Patient: Becky Leach           Date of Birth: 1929/07/09           MRN: [id number] Visit Date: 11/28/2016              Requested by: Guadalupe Dawn, MD [phone],  [id number] PCP: Guadalupe Dawn, MD   Assessment & Plan: Visit Diagnoses:  1. Chronic pain of both knees     Plan: She tolerated the steroid injections in both her knees after recommendation for this. She's had this before. She's definitely candidate to try hyaluronic acid as well. Her and her son are interested in this. We'll see her back in 4 weeks and hopefully we will place hyaluronic acid injections in both her knees to treat her bilateral knee pain with moderate arthritis.  Follow-Up Instructions: Return in about 4 weeks (around 12/26/2016).   Orders:  Orders Placed This Encounter  Procedures  . Large Joint Injection/Arthrocentesis  . Large Joint Injection/Arthrocentesis  . XR Knee 1-2 Views Left  . XR Knee 1-2 Views Right   No orders of the defined types were placed in this encounter.     Procedures: Large Joint Inj Date/Time: 11/28/2016 3:41 PM Performed by: Mcarthur Rossetti Authorized by: Jean Rosenthal Y   Location:  Knee Site:  R knee Ultrasound Guidance: No   Fluoroscopic Guidance: No   Arthrogram: No   Medications:  3 mL lidocaine 1 %; 40 mg methylPREDNISolone acetate 40 MG/ML Large Joint Inj Date/Time: 11/28/2016 3:41 PM Performed by: Mcarthur Rossetti Authorized by: Mcarthur Rossetti   Location:  Knee Site:  L knee Ultrasound Guidance: No   Fluoroscopic Guidance: No   Arthrogram: No   Medications:  3 mL lidocaine 1 %; 40 mg methylPREDNISolone acetate 40 MG/ML     Clinical Data: No additional findings.   Subjective: No chief complaint on file. The patient is a 83 year old with chronic bilateral knee pain. We have seen her before. Steroids of helped in the past and she would like to have steroid injections again  today. She him since the cane is very slow to mobilize. Her signs with her today. She has never had hyaluronic acid injections before. She denies any locking catching and is mainly stiffness and pain when she's been sitting down the first gets up to move.  HPI  Review of Systems She currently denies a headache, chest pain, short of breath, fever, chills, nausea, vomiting.  Objective: Vital Signs: There were no vitals taken for this visit.  Physical Exam She is alert or 3 in no acute distress. Ortho Exam Examination both knee show no effusion. There is some moderate patellofemoral crepitation but no knee instability. There is medial lateral joint line tenderness as well. Comments:  No specialty comments available.  Imaging: Xr Knee 1-2 Views Left  Result Date: 11/28/2016 An AP and lateral left knee show moderate severe arthritic changes with no malalignment in no acute findings.  Xr Knee 1-2 Views Right  Result Date: 11/28/2016 An AP and lateral of her right knee show moderate to severe arthritic changes with no acute findings and normal alignment.    PMFS History: Patient Active Problem List   Diagnosis Date Noted  . Urge urinary incontinence 11/19/2014  . Pain of left lower extremity 08/28/2014  . Allergic rhinitis 02/04/2014  . Hot flashes 12/12/2013  . Spinal stenosis of lumbar region 11/14/2013  . Unspecified constipation 11/14/2013  .  Post-menopausal bleeding 12/19/2011  . Benign essential HTN 12/06/2011   Past Medical History:  Diagnosis Date  . AKI (acute kidney injury) ([place])    due to NSAID  . Arthritis   . Constipation   . Herniated disc   . Hypertension   . Seasonal allergies   . Spinal stenosis   . Stomach ulcer    due to NSAID  . Tachycardia   . UTI (lower urinary tract infection)     Family History  Problem Relation Age of Onset  . Arthritis Mother   . Heart attack Mother     Past Surgical History:  Procedure Laterality Date  . COLONOSCOPY   12/21/2011   Procedure: COLONOSCOPY;  Surgeon: Wonda Horner, MD;  Location: [facility];  Service: Endoscopy;  Laterality: N/A;  . DILATION AND CURETTAGE OF UTERUS    . TONSILLECTOMY AND ADENOIDECTOMY     Social History   Occupational History  . Not on file.   Social History Main Topics  . Smoking status: Never Smoker  . Smokeless tobacco: Never Used  . Alcohol use No  . Drug use: No  . Sexual activity: No

## 2016-12-26 ENCOUNTER — Ambulatory Visit (INDEPENDENT_AMBULATORY_CARE_PROVIDER_SITE_OTHER): Payer: Medicare Other | Admitting: Orthopaedic Surgery

## 2016-12-26 DIAGNOSIS — M170 Bilateral primary osteoarthritis of knee: Secondary | ICD-10-CM | POA: Insufficient documentation

## 2016-12-26 DIAGNOSIS — M1712 Unilateral primary osteoarthritis, left knee: Secondary | ICD-10-CM | POA: Insufficient documentation

## 2016-12-26 DIAGNOSIS — M1711 Unilateral primary osteoarthritis, right knee: Secondary | ICD-10-CM

## 2016-12-26 MED ORDER — HYALURONAN 88 MG/4ML IX SOSY
88.0000 mg | PREFILLED_SYRINGE | INTRA_ARTICULAR | Status: AC | PRN
  Administered 2016-12-26: 88 mg via INTRA_ARTICULAR

## 2016-12-26 MED ORDER — METHYLPREDNISOLONE ACETATE 40 MG/ML IJ SUSP
40.0000 mg | INTRAMUSCULAR | Status: AC | PRN
Start: 2016-12-26 — End: 2016-12-26
  Administered 2016-12-26: 40 mg via INTRA_ARTICULAR

## 2016-12-26 NOTE — Progress Notes (Signed)
   Procedure Note  Patient: Becky Leach             Date of Birth: January 21, 1930           MRN: [id number]             Visit Date: 12/26/2016  Procedures: Visit Diagnoses: Unilateral primary osteoarthritis, left knee  Unilateral primary osteoarthritis, right knee  Large Joint Inj Date/Time: 12/26/2016 3:27 PM Performed by: Mcarthur Rossetti Authorized by: Mcarthur Rossetti   Location:  Knee Ultrasound Guidance: No   Fluoroscopic Guidance: No   Arthrogram: No   Medications:  88 mg Hyaluronan 88 MG/4ML Large Joint Inj Date/Time: 12/26/2016 3:28 PM Performed by: Mcarthur Rossetti Authorized by: Mcarthur Rossetti   Location:  Knee Site:  L knee Ultrasound Guidance: No   Fluoroscopic Guidance: No   Arthrogram: No   Medications:  40 mg methylPREDNISolone acetate 40 MG/ML; 88 mg Hyaluronan 88 MG/4ML   The patient is here for scheduled bilateral knee hyaluronic acid injections with Monovisc. We had a long and thorough discussion about the risk and benefits of this knee to treat bilateral knee pain and osteoarthritis. We're able to place both injections without difficulty. We'll see her back in 3 months to see how she is doing overall. We can always place steroid injections then. All questions were encouraged and answered.

## 2017-01-04 ENCOUNTER — Ambulatory Visit (INDEPENDENT_AMBULATORY_CARE_PROVIDER_SITE_OTHER): Payer: Medicare Other | Admitting: Family Medicine

## 2017-01-04 DIAGNOSIS — R262 Difficulty in walking, not elsewhere classified: Secondary | ICD-10-CM | POA: Diagnosis not present

## 2017-01-04 DIAGNOSIS — M48062 Spinal stenosis, lumbar region with neurogenic claudication: Secondary | ICD-10-CM | POA: Diagnosis not present

## 2017-01-04 DIAGNOSIS — I10 Essential (primary) hypertension: Secondary | ICD-10-CM | POA: Diagnosis present

## 2017-01-11 ENCOUNTER — Telehealth: Payer: Self-pay | Admitting: Family Medicine

## 2017-01-11 NOTE — Telephone Encounter (Signed)
Son is calling because he needs orders sent to [facility] for his mother. He said on the last visit he dicussed this with the doctor but Epic was down and he could not send the orders over. They need a new bathroom shower bench and some other items that he said he told the doctor that day. Can we send these orders to [facility]. jw

## 2017-01-12 ENCOUNTER — Encounter: Payer: Self-pay | Admitting: Family Medicine

## 2017-01-12 DIAGNOSIS — R262 Difficulty in walking, not elsewhere classified: Secondary | ICD-10-CM | POA: Insufficient documentation

## 2017-01-12 NOTE — Addendum Note (Signed)
Addended by: Pauletta Browns on: 01/12/2017 02:28 PM   Modules accepted: Orders

## 2017-01-12 NOTE — Assessment & Plan Note (Signed)
Patient with some trouble standing for long periods of time 2/2 medical comorbidities and age. Current DME is in poor shape per son's account and he believes it is time for new equipment. Will plan on order new shower bench.

## 2017-01-12 NOTE — Progress Notes (Signed)
   HPI 83 year old female who presents for follow up as well as to meet her new provider. At last clinic visit in march the patient was discontinued from her norvasc 2.5mg  daily. Since that visit the patient has routinely checked her blood pressure once a day and has been in the 283T or 517O systolic. She does not endorse any lightheadedness, dizziness, or headaches. She also presents because her DME (ie showerside bench) has become worn out and she was hoping that I could order her another one.  The patient also has a number of other concerns including glaucoma, peripheral arterial disease, and a rash on her neck. The patient is having no vision problems associated with glaucoma and acknowledges that she does have macular degeneration in both eyes due to her age. The patient has lumbar stenosis and does have neurogenic claudication from time to time. She is concerned because she read that neurogenic claudication and claudication caused by PAD can mimic each other. The rash on her neck started around one week ago. While she has not ever seen any erythema she does endorse itching. It comes and goes, and that is her only symptom.  CC: Follow up on blood pressure/needs new DME   ROS: Review of Systems  Constitutional: Negative for chills, fever and weight loss.  HENT: Negative for ear discharge, ear pain, hearing loss, nosebleeds and tinnitus.   Eyes: Positive for blurred vision. Negative for pain, discharge and redness.  Cardiovascular: Positive for claudication. Negative for chest pain, palpitations and orthopnea.  Gastrointestinal: Negative for abdominal pain, constipation, diarrhea, heartburn, nausea and vomiting.  Genitourinary: Negative for dysuria and urgency.  Musculoskeletal: Positive for back pain and joint pain. Negative for myalgias.  Skin: Positive for itching. Negative for rash.  Neurological: Negative for dizziness, weakness and headaches.    Review of Systems See HPI for ROS.    CC, SH/smoking status, and VS noted  Objective: There were no vitals taken for this visit. Gen: NAD, alert, cooperative, and pleasant. HEENT: NCAT, EOMI, PERRL, no erythema, cords, or other concerning findings noted on left neck where itching is CV: RRR, no murmur Resp: CTAB, no wheezes, non-labored Abd: SNTND, BS present, no guarding or organomegaly Ext: No edema, warm Vascular: Patient has 2+ palpable bilateral radial, bilateral pt/dp, cap refill <2. Neuro: Alert and oriented, Speech clear, No gross deficits   Assessment and plan:  Benign essential HTN Patient with no blood pressure abnormality over last 5 months since lowering norvasc. Will plan to continue current regimen as this seem to be working very well for her. - continue norvasc 2.5mg  daily  Spinal stenosis of lumbar region Patient with s/s consistent with neurogenic claudication. There was concern that patient could be having vascular claudication on top of neurogenic claudication. Patient with palpable pulses in BLE/BUE. I believe that all symptoms are due to neurogenic claudication. No indication to change treatment at this time.  Impaired ambulation Patient with some trouble standing for long periods of time 2/2 medical comorbidities and age. Current DME is in poor shape per son's account and he believes it is time for new equipment. Will plan on order new shower bench.   No orders of the defined types were placed in this encounter.   No orders of the defined types were placed in this encounter.   Guadalupe Dawn MD PGY-1 Family Medicine Residency 01/12/2017 11:40 AM

## 2017-01-12 NOTE — Assessment & Plan Note (Signed)
Patient with no blood pressure abnormality over last 5 months since lowering norvasc. Will plan to continue current regimen as this seem to be working very well for her. - continue norvasc 2.5mg  daily

## 2017-01-12 NOTE — Assessment & Plan Note (Signed)
Patient with s/s consistent with neurogenic claudication. There was concern that patient could be having vascular claudication on top of neurogenic claudication. Patient with palpable pulses in BLE/BUE. I believe that all symptoms are due to neurogenic claudication. No indication to change treatment at this time.

## 2017-01-12 NOTE — Patient Instructions (Signed)
This encounter occurred during EPIC downtime. Patient was given AVS as printed by me at time of visit.

## 2017-01-23 ENCOUNTER — Encounter (INDEPENDENT_AMBULATORY_CARE_PROVIDER_SITE_OTHER): Payer: Self-pay | Admitting: Orthopaedic Surgery

## 2017-01-23 ENCOUNTER — Ambulatory Visit (INDEPENDENT_AMBULATORY_CARE_PROVIDER_SITE_OTHER): Payer: Medicare Other

## 2017-01-23 ENCOUNTER — Ambulatory Visit (INDEPENDENT_AMBULATORY_CARE_PROVIDER_SITE_OTHER): Payer: Self-pay

## 2017-01-23 ENCOUNTER — Ambulatory Visit (INDEPENDENT_AMBULATORY_CARE_PROVIDER_SITE_OTHER): Payer: Medicare Other | Admitting: Orthopaedic Surgery

## 2017-01-23 DIAGNOSIS — M1711 Unilateral primary osteoarthritis, right knee: Secondary | ICD-10-CM

## 2017-01-23 DIAGNOSIS — M1712 Unilateral primary osteoarthritis, left knee: Secondary | ICD-10-CM

## 2017-01-23 MED ORDER — METHYLPREDNISOLONE ACETATE 40 MG/ML IJ SUSP
40.0000 mg | INTRAMUSCULAR | Status: AC | PRN
  Administered 2017-01-23: 40 mg via INTRA_ARTICULAR

## 2017-01-23 MED ORDER — LIDOCAINE HCL 1 % IJ SOLN
3.0000 mL | INTRAMUSCULAR | Status: AC | PRN
  Administered 2017-01-23: 3 mL

## 2017-01-23 NOTE — Progress Notes (Signed)
Office Visit Note   Patient: Becky Leach           Date of Birth: April 29, 1930           MRN: [id number] Visit Date: 01/23/2017              Requested by: Guadalupe Dawn, MD [phone], [place] [address] PCP: Guadalupe Dawn, MD   Assessment & Plan: Visit Diagnoses:  1. Unilateral primary osteoarthritis, left knee   2. Unilateral primary osteoarthritis, right knee     Plan: I agree with trying steroid injections in her knees today. She is 83 years old and wants to not pursue any surgery ever. I agree with steroid injections about every 3 months. She'll follow-up in November of this year for repeat steroid injections in both her knees. All questions were encouraged and answered.  Follow-Up Instructions: Return in about 3 months (around 04/25/2017).   Orders:  Orders Placed This Encounter  Procedures  . Large Joint Injection/Arthrocentesis  . Large Joint Injection/Arthrocentesis  . XR Knee 1-2 Views Right  . XR Knee 1-2 Views Left   No orders of the defined types were placed in this encounter.     Procedures: Large Joint Inj Date/Time: 01/23/2017 3:04 PM Performed by: Mcarthur Rossetti Authorized by: Jean Rosenthal Y   Location:  Knee Site:  R knee Ultrasound Guidance: No   Fluoroscopic Guidance: No   Arthrogram: No   Medications:  3 mL lidocaine 1 %; 40 mg methylPREDNISolone acetate 40 MG/ML Large Joint Inj Date/Time: 01/23/2017 3:04 PM Performed by: Mcarthur Rossetti Authorized by: Mcarthur Rossetti   Location:  Knee Site:  L knee Ultrasound Guidance: No   Fluoroscopic Guidance: No   Arthrogram: No   Medications:  3 mL lidocaine 1 %; 40 mg methylPREDNISolone acetate 40 MG/ML     Clinical Data: No additional findings.   Subjective: Chief Complaint  Patient presents with  . Right Knee - Pain  . Left Knee - Pain  The patient is a 83 year old who is just under a month out from bilateral hyaluronic acid injections  in her knees. She him most with a cane. Her son is with her. He says her knees are very stiff and she actually fell soon after the injections. He said he would not like to have those injections in her knees again. He was consider steroid shots in her knees today as well as new x-rays.  HPI  Review of Systems She currently denies any headache, chest pain, shortness of breath, fever, chills, nausea, vomiting.  Objective: Vital Signs: There were no vitals taken for this visit.  Physical Exam She appears alert and oriented no acute distress she family with a cane. Ortho Exam Both knees are quite stiff throughout flexion extension is difficult to bend her much past 90 of flexion. Neither knee shows an effusion though. Specialty Comments:  No specialty comments available.  Imaging: Xr Knee 1-2 Views Left  Result Date: 01/23/2017 2 views left knee show no acute findings other than osteopenia and tricompartmental arthritis.  Xr Knee 1-2 Views Right  Result Date: 01/23/2017 2 views of the right knee show osteopenia and try confirm arthritis with no acute findings.    PMFS History: Patient Active Problem List   Diagnosis Date Noted  . Impaired ambulation 01/12/2017  . Unilateral primary osteoarthritis, left knee 12/26/2016  . Unilateral primary osteoarthritis, right knee 12/26/2016  . Urge urinary incontinence 11/19/2014  . Pain of left  lower extremity 08/28/2014  . Allergic rhinitis 02/04/2014  . Hot flashes 12/12/2013  . Spinal stenosis of lumbar region 11/14/2013  . Post-menopausal bleeding 12/19/2011  . Benign essential HTN 12/06/2011   Past Medical History:  Diagnosis Date  . AKI (acute kidney injury) ([place])    due to NSAID  . Arthritis   . Constipation   . Herniated disc   . Hypertension   . Seasonal allergies   . Spinal stenosis   . Stomach ulcer    due to NSAID  . Tachycardia   . UTI (lower urinary tract infection)     Family History  Problem Relation Age of  Onset  . Arthritis Mother   . Heart attack Mother     Past Surgical History:  Procedure Laterality Date  . COLONOSCOPY  12/21/2011   Procedure: COLONOSCOPY;  Surgeon: Wonda Horner, MD;  Location: [facility];  Service: Endoscopy;  Laterality: N/A;  . DILATION AND CURETTAGE OF UTERUS    . TONSILLECTOMY AND ADENOIDECTOMY     Social History   Occupational History  . Not on file.   Social History Main Topics  . Smoking status: Never Smoker  . Smokeless tobacco: Never Used  . Alcohol use No  . Drug use: No  . Sexual activity: No

## 2017-02-07 ENCOUNTER — Other Ambulatory Visit: Payer: Self-pay | Admitting: Family Medicine

## 2017-02-07 DIAGNOSIS — M159 Polyosteoarthritis, unspecified: Secondary | ICD-10-CM

## 2017-02-07 DIAGNOSIS — M150 Primary generalized (osteo)arthritis: Principal | ICD-10-CM

## 2017-02-16 DIAGNOSIS — I739 Peripheral vascular disease, unspecified: Secondary | ICD-10-CM | POA: Diagnosis not present

## 2017-02-16 DIAGNOSIS — L603 Nail dystrophy: Secondary | ICD-10-CM | POA: Diagnosis not present

## 2017-02-16 DIAGNOSIS — L84 Corns and callosities: Secondary | ICD-10-CM | POA: Diagnosis not present

## 2017-03-07 ENCOUNTER — Other Ambulatory Visit: Payer: Self-pay | Admitting: Family Medicine

## 2017-03-07 DIAGNOSIS — M159 Polyosteoarthritis, unspecified: Secondary | ICD-10-CM

## 2017-03-07 DIAGNOSIS — M150 Primary generalized (osteo)arthritis: Principal | ICD-10-CM

## 2017-03-10 ENCOUNTER — Ambulatory Visit (INDEPENDENT_AMBULATORY_CARE_PROVIDER_SITE_OTHER): Payer: Medicare Other | Admitting: Family Medicine

## 2017-03-10 ENCOUNTER — Encounter: Payer: Self-pay | Admitting: Family Medicine

## 2017-03-10 VITALS — BP 130/80 | HR 88 | Temp 97.8°F | Ht 62.0 in | Wt 106.0 lb

## 2017-03-10 DIAGNOSIS — Z23 Encounter for immunization: Secondary | ICD-10-CM

## 2017-03-10 DIAGNOSIS — R197 Diarrhea, unspecified: Secondary | ICD-10-CM

## 2017-03-10 DIAGNOSIS — J3489 Other specified disorders of nose and nasal sinuses: Secondary | ICD-10-CM

## 2017-03-10 DIAGNOSIS — R262 Difficulty in walking, not elsewhere classified: Secondary | ICD-10-CM | POA: Diagnosis not present

## 2017-03-10 NOTE — Patient Instructions (Addendum)
Today we discussed your leg pain, diarrhea, and allergies We discussed that if your diarrhea does not resolve in 3-4 days, please come back to clinic and see [place] Regarding the allergies, please take your claritin and flonase. If the flonase continues to burn your nose, please only take the claritin I have given you some leg exercises and stretches that should help with your leg pain We have given you a flu shot at this visit

## 2017-03-13 ENCOUNTER — Other Ambulatory Visit: Payer: Self-pay | Admitting: *Deleted

## 2017-03-13 DIAGNOSIS — M79605 Pain in left leg: Secondary | ICD-10-CM

## 2017-03-13 MED ORDER — TRAMADOL HCL 50 MG PO TABS: ORAL_TABLET | ORAL | 1 refills | Status: DC

## 2017-03-13 NOTE — Telephone Encounter (Signed)
Patient son calling to request refill on tramadol for patient, states they forgot to ask at appointment Friday. [facility]

## 2017-03-14 ENCOUNTER — Other Ambulatory Visit: Payer: Self-pay | Admitting: *Deleted

## 2017-03-14 DIAGNOSIS — M150 Primary generalized (osteo)arthritis: Principal | ICD-10-CM

## 2017-03-14 DIAGNOSIS — M159 Polyosteoarthritis, unspecified: Secondary | ICD-10-CM

## 2017-03-14 NOTE — Telephone Encounter (Signed)
Also needs portable toilet and bench that goes in the bath tub needs order sent to [facility]. Manpreet Kemmer Kennon Holter, CMA

## 2017-03-16 ENCOUNTER — Telehealth: Payer: Self-pay | Admitting: *Deleted

## 2017-03-16 ENCOUNTER — Encounter: Payer: Self-pay | Admitting: Family Medicine

## 2017-03-16 MED ORDER — DICLOFENAC SODIUM 1 % TD GEL: 2.0000 g | Freq: Four times a day (QID) | TRANSDERMAL | 5 refills | Status: DC | PRN

## 2017-03-16 NOTE — Telephone Encounter (Signed)
Patient's son came in to pick up rx for tramadol. Refill not appropriate till Dec 2018. Patient also asking about DME ordered in Aug 2018. Message sent to [facility] that orders have been placed. Patient arrived via taxi and in need of taxi voucher for return trip. Taxi voucher given ($18) per JR. Voucher faxed to [facility] at [phone].  Hubbard Hartshorn, RN, BSN

## 2017-03-16 NOTE — Progress Notes (Signed)
Patient's son came in for tramadol refill. According to the front office staff, Dr. Kris Mouton promised to send prescription of Becky Leach's tramadol to the pharmacy, but it never got there. He also stated he is waiting on her Voltaren gel.   I called Dr. Kris Mouton who stated he called in her Voltaren gel to the pharmacy already. I called the pharmacy, and I was informed they indeed have the Voltaren gel, and it should be ready soon.  Patient picked up her last Tramadol prescription on Sept 17th for a 90 days supply, hence, should not need refill till Dec 17th. Message relayed to the front office to advise patient that we will not be refilling tramadol at this time. Schedule PCP appointment soon for further discussion. Dr. Kris Mouton is aware of the plan.

## 2017-03-17 MED ORDER — TRANSFER BENCH MISC: 1.0000 [IU] | Freq: Once | 0 refills | Status: DC

## 2017-03-18 DIAGNOSIS — J3489 Other specified disorders of nose and nasal sinuses: Secondary | ICD-10-CM | POA: Insufficient documentation

## 2017-03-18 DIAGNOSIS — Z23 Encounter for immunization: Secondary | ICD-10-CM | POA: Insufficient documentation

## 2017-03-18 NOTE — Progress Notes (Signed)
HPI  83 year female who presents for follow up, although it is unclear what she is following up for as my most recent note says to follow up in 1 year unless additional issues arise. Patient lists her current concerns as having a little diarrhea for the last 2-3 days. It is described as a brown liquid bowel movement. This is interspersed with regular, more solid appearing bowel movements. She has been able to hydrate well and has had no decreased po intake. She has had no fevers, no nausea, and no vomiting.  Patient also says that she has been having rhinorrhea for the last week or so. She gets these symptoms every year around this time. She does not endorse any eye watery, dry eyes, cough, or congestive symptoms. She knows that she should take her flonase and Claritin but feels that the flonase burns the back of the throat when she takes it.  Patient also needs a flu vaccine, as well as a refill for diclofenac gel, and a script for a tub transfer bench.  Addendum 10/18: Patient presented to clinic hoping for a refill of tramadol. The most recent prescription for this was not due to expire until December. I did not prescribe this medication at this visit.  CC: diarrhea   ROS: Review of Systems  Constitutional: Negative for chills and fever.  HENT: Negative for congestion.   Cardiovascular: Positive for claudication (neurogenic). Negative for chest pain, palpitations and orthopnea.  Gastrointestinal: Positive for diarrhea. Negative for abdominal pain, constipation, nausea and vomiting.  Genitourinary: Negative for dysuria and urgency.  Musculoskeletal: Positive for neck pain.  Neurological: Negative for dizziness, weakness and headaches.    Review of Systems See HPI for ROS.   CC, SH/smoking status, and VS noted  Objective: BP 130/80   Pulse 88   Temp 97.8 F (36.6 C) (Oral)   Ht 5\' 2"  (1.575 m)   Wt 106 lb (48.1 kg)   SpO2 98%   BMI 19.39 kg/m  Gen: elderly, frail. NAD,  alert, cooperative, and pleasant. HEENT: NCAT, EOMI, PERRL CV: RRR, no murmur. Palpable PT/DP bilaterally. Resp: CTAB, no wheezes, non-labored Abd: SNTND, BS present, no guarding or organomegaly Ext: No edema, warm Neuro: Alert and oriented, Speech clear, No gross deficits   Assessment and plan:  Diarrhea Patient with only 2-3 days of diarrhea. This is interspersed with regular bowel movements. These appear to just be looser bowel movements than usual. It is possible a very mild GI virus that is causing this and should resolve within 1-2 days. I have instructed patient to come back to clinic if symptoms do not resolve in 3-4 days or if she develops new symptoms like fever, n/v, or abdominal pain. Explained that hydration will be the key and that she needs to be drinking an adequate amount of water and electrolyte supplementation such as Gatorade until she clears this. Can potentially explore imodium at next visit if needed, but do not feel it is indicated at this time. - if symptoms do not improve, RTC in 3-4 days - Hydration to replete what is being lost - Consider imodium if needed at next clinic visit if it does not resolve  Rhinorrhea Patient with intermittent rhinorrhea at home for last week. Likely 2/2 seasonal allergies. No fevers or any other associated symptoms. Continue to use claritin and flonase. Educated patient on how to use flonase properly as it seems that she was holding it too far into her nostril and it was hitting  her back throat. - continue flonase - continue claritin  Impaired ambulation [facility] apparently never received the DME order for a tub transfer bench from the last visit. Unclear why patient did not pursue further as that visit happened months ago. Have replaced this order. - Tub Transfer bench script given  Need for immunization against influenza Flu vaccine given 03/10/2017   Orders Placed This Encounter  Procedures  . DME Other see comment      Tub transfer bench  . Flu Vaccine QUAD 36+ mos IM    Meds ordered this encounter  Medications  . DISCONTD: Misc. Devices (TRANSFER BENCH) MISC    Sig: 1 Units by Does not apply route once.    Dispense:  1 each    Refill:  0  . DISCONTD: Misc. Devices (TRANSFER BENCH) MISC    Sig: 1 Units by Does not apply route once.    Dispense:  1 each    Refill:  0     Guadalupe Dawn MD PGY-1 Family Medicine Resident 03/20/2017 6:37 AM

## 2017-03-20 NOTE — Assessment & Plan Note (Signed)
[facility] apparently never received the DME order for a tub transfer bench from the last visit. Unclear why patient did not pursue further as that visit happened months ago. Have replaced this order. - Tub Transfer bench script given

## 2017-03-20 NOTE — Assessment & Plan Note (Signed)
Flu vaccine given 03/10/2017

## 2017-03-20 NOTE — Assessment & Plan Note (Addendum)
Patient with only 2-3 days of diarrhea. This is interspersed with regular bowel movements. These appear to just be looser bowel movements than usual. It is possible a very mild GI virus that is causing this and should resolve within 1-2 days. I have instructed patient to come back to clinic if symptoms do not resolve in 3-4 days or if she develops new symptoms like fever, n/v, or abdominal pain. Explained that hydration will be the key and that she needs to be drinking an adequate amount of water and electrolyte supplementation such as Gatorade until she clears this. Can potentially explore imodium at next visit if needed, but do not feel it is indicated at this time. - if symptoms do not improve, RTC in 3-4 days - Hydration to replete what is being lost - Consider imodium if needed at next clinic visit if it does not resolve

## 2017-03-20 NOTE — Assessment & Plan Note (Signed)
Patient with intermittent rhinorrhea at home for last week. Likely 2/2 seasonal allergies. No fevers or any other associated symptoms. Continue to use claritin and flonase. Educated patient on how to use flonase properly as it seems that she was holding it too far into her nostril and it was hitting her back throat. - continue flonase - continue claritin

## 2017-03-27 ENCOUNTER — Ambulatory Visit (INDEPENDENT_AMBULATORY_CARE_PROVIDER_SITE_OTHER): Payer: Medicare Other | Admitting: Orthopaedic Surgery

## 2017-04-26 ENCOUNTER — Ambulatory Visit (INDEPENDENT_AMBULATORY_CARE_PROVIDER_SITE_OTHER): Payer: Self-pay | Admitting: Orthopaedic Surgery

## 2017-04-27 ENCOUNTER — Encounter (INDEPENDENT_AMBULATORY_CARE_PROVIDER_SITE_OTHER): Payer: Self-pay | Admitting: Physician Assistant

## 2017-04-27 ENCOUNTER — Ambulatory Visit (INDEPENDENT_AMBULATORY_CARE_PROVIDER_SITE_OTHER): Payer: Medicare Other | Admitting: Physician Assistant

## 2017-04-27 DIAGNOSIS — M1711 Unilateral primary osteoarthritis, right knee: Secondary | ICD-10-CM

## 2017-04-27 DIAGNOSIS — M1712 Unilateral primary osteoarthritis, left knee: Secondary | ICD-10-CM | POA: Diagnosis not present

## 2017-04-27 MED ORDER — LIDOCAINE HCL 1 % IJ SOLN
0.5000 mL | INTRAMUSCULAR | Status: AC | PRN
  Administered 2017-04-27: .5 mL

## 2017-04-27 MED ORDER — METHYLPREDNISOLONE ACETATE 40 MG/ML IJ SUSP
40.0000 mg | INTRAMUSCULAR | Status: AC | PRN
  Administered 2017-04-27: 40 mg via INTRA_ARTICULAR

## 2017-04-27 NOTE — Progress Notes (Signed)
   Procedure Note  Patient: Becky Leach             Date of Birth: [birth year]           MRN: [id number]             Visit Date: 04/27/2017 HPI Mrs. Toney Rakes well-known to our department service comes in today requesting injections in both knees with cortisone.  She has had Monovisc injections in the past but these actually cause increased pain in her knees.  Since last given cortisone injections in both knees in August of this year was done well until recently.  She had no new injury.  She is having increased pain in both knees left greater than right.    Physical exam: Bilateral knees full range of motion without pain.  She has tenderness left knee along the lateral joint line.  No instability with valgus varus stressing of either knee.  No effusion abnormal warmth or erythema of either knee.  Procedures: Visit Diagnoses: Unilateral primary osteoarthritis, left knee  Unilateral primary osteoarthritis, right knee  Large Joint Inj: bilateral knee on 04/27/2017 3:08 PM Indications: pain Details: 22 G 1.5 in needle, anterolateral approach  Arthrogram: No  Medications (Right): 0.5 mL lidocaine 1 %; 40 mg methylPREDNISolone acetate 40 MG/ML Medications (Left): 0.5 mL lidocaine 1 %; 40 mg methylPREDNISolone acetate 40 MG/ML Outcome: tolerated well, no immediate complications Procedure, treatment alternatives, risks and benefits explained, specific risks discussed. Consent was given by the patient. Immediately prior to procedure a time out was called to verify the correct patient, procedure, equipment, support staff and site/side marked as required. Patient was prepped and draped in the usual sterile fashion.     Plan: We will see her back in 3 months for cortisone injections both knees.  Questions are encouraged and answered.

## 2017-05-11 ENCOUNTER — Observation Stay (HOSPITAL_BASED_OUTPATIENT_CLINIC_OR_DEPARTMENT_OTHER): Payer: Medicare Other

## 2017-05-11 ENCOUNTER — Emergency Department (HOSPITAL_COMMUNITY): Payer: Medicare Other

## 2017-05-11 ENCOUNTER — Other Ambulatory Visit: Payer: Self-pay

## 2017-05-11 ENCOUNTER — Inpatient Hospital Stay (HOSPITAL_COMMUNITY)
Admission: EM | Admit: 2017-05-11 | Discharge: 2017-05-15 | DRG: 176 | Disposition: A | Discharge status: Home Health | Payer: Medicare Other | Attending: Internal Medicine | Admitting: Internal Medicine

## 2017-05-11 ENCOUNTER — Encounter (HOSPITAL_COMMUNITY): Payer: Self-pay

## 2017-05-11 DIAGNOSIS — Z8711 Personal history of peptic ulcer disease: Secondary | ICD-10-CM

## 2017-05-11 DIAGNOSIS — I361 Nonrheumatic tricuspid (valve) insufficiency: Secondary | ICD-10-CM

## 2017-05-11 DIAGNOSIS — I2609 Other pulmonary embolism with acute cor pulmonale: Secondary | ICD-10-CM | POA: Diagnosis not present

## 2017-05-11 DIAGNOSIS — Z86711 Personal history of pulmonary embolism: Secondary | ICD-10-CM | POA: Diagnosis present

## 2017-05-11 DIAGNOSIS — I2602 Saddle embolus of pulmonary artery with acute cor pulmonale: Secondary | ICD-10-CM | POA: Diagnosis not present

## 2017-05-11 DIAGNOSIS — I2699 Other pulmonary embolism without acute cor pulmonale: Secondary | ICD-10-CM | POA: Diagnosis present

## 2017-05-11 DIAGNOSIS — I129 Hypertensive chronic kidney disease with stage 1 through stage 4 chronic kidney disease, or unspecified chronic kidney disease: Secondary | ICD-10-CM | POA: Diagnosis present

## 2017-05-11 DIAGNOSIS — R42 Dizziness and giddiness: Secondary | ICD-10-CM | POA: Diagnosis not present

## 2017-05-11 DIAGNOSIS — N183 Chronic kidney disease, stage 3 (moderate): Secondary | ICD-10-CM | POA: Diagnosis present

## 2017-05-11 DIAGNOSIS — Z8249 Family history of ischemic heart disease and other diseases of the circulatory system: Secondary | ICD-10-CM

## 2017-05-11 DIAGNOSIS — N281 Cyst of kidney, acquired: Secondary | ICD-10-CM | POA: Diagnosis present

## 2017-05-11 DIAGNOSIS — T45515A Adverse effect of anticoagulants, initial encounter: Secondary | ICD-10-CM | POA: Diagnosis not present

## 2017-05-11 DIAGNOSIS — M1990 Unspecified osteoarthritis, unspecified site: Secondary | ICD-10-CM | POA: Diagnosis present

## 2017-05-11 DIAGNOSIS — Z880 Allergy status to penicillin: Secondary | ICD-10-CM

## 2017-05-11 DIAGNOSIS — Y92230 Patient room in hospital as the place of occurrence of the external cause: Secondary | ICD-10-CM | POA: Diagnosis not present

## 2017-05-11 DIAGNOSIS — R079 Chest pain, unspecified: Secondary | ICD-10-CM | POA: Diagnosis not present

## 2017-05-11 DIAGNOSIS — R404 Transient alteration of awareness: Secondary | ICD-10-CM | POA: Diagnosis not present

## 2017-05-11 DIAGNOSIS — Z8261 Family history of arthritis: Secondary | ICD-10-CM

## 2017-05-11 DIAGNOSIS — I82411 Acute embolism and thrombosis of right femoral vein: Secondary | ICD-10-CM | POA: Diagnosis present

## 2017-05-11 DIAGNOSIS — R0602 Shortness of breath: Secondary | ICD-10-CM | POA: Diagnosis not present

## 2017-05-11 DIAGNOSIS — D6959 Other secondary thrombocytopenia: Secondary | ICD-10-CM | POA: Diagnosis not present

## 2017-05-11 LAB — URINALYSIS, ROUTINE W REFLEX MICROSCOPIC
BACTERIA UA: NONE SEEN
Bilirubin Urine: NEGATIVE
GLUCOSE, UA: NEGATIVE mg/dL
Hgb urine dipstick: NEGATIVE
Ketones, ur: NEGATIVE mg/dL
Nitrite: NEGATIVE
PH: 7 (ref 5.0–8.0)
PROTEIN: NEGATIVE mg/dL
Specific Gravity, Urine: 1.027 (ref 1.005–1.030)

## 2017-05-11 LAB — BASIC METABOLIC PANEL
Anion gap: 10 (ref 5–15)
BUN: 18 mg/dL (ref 6–20)
CALCIUM: 10.1 mg/dL (ref 8.9–10.3)
CHLORIDE: 104 mmol/L (ref 101–111)
CO2: 27 mmol/L (ref 22–32)
CREATININE: 1.2 mg/dL — AB (ref 0.44–1.00)
GFR, EST AFRICAN AMERICAN: 46 mL/min — AB (ref >60)
GFR, EST NON AFRICAN AMERICAN: 40 mL/min — AB (ref >60)
Glucose, Bld: 101 mg/dL — ABNORMAL HIGH (ref 65–99)
Potassium: 3.9 mmol/L (ref 3.5–5.1)
SODIUM: 141 mmol/L (ref 135–145)

## 2017-05-11 LAB — ECHOCARDIOGRAM COMPLETE: Weight: 1696 [oz_av]

## 2017-05-11 LAB — CBC
HCT: 43 % (ref 36.0–46.0)
Hemoglobin: 14.1 g/dL (ref 12.0–15.0)
MCH: 28.8 pg (ref 26.0–34.0)
MCHC: 32.8 g/dL (ref 30.0–36.0)
MCV: 87.9 fL (ref 78.0–100.0)
PLATELETS: 156 10*3/uL (ref 150–400)
RBC: 4.89 MIL/uL (ref 3.87–5.11)
RDW: 14.8 % (ref 11.5–15.5)
WBC: 8.3 10*3/uL (ref 4.0–10.5)

## 2017-05-11 LAB — I-STAT TROPONIN, ED: TROPONIN I, POC: 0.38 ng/mL — AB (ref 0.00–0.08)

## 2017-05-11 LAB — D-DIMER, QUANTITATIVE (NOT AT ARMC): D DIMER QUANT: 17.18 ug{FEU}/mL — AB (ref 0.00–0.50)

## 2017-05-11 LAB — HEPARIN LEVEL (UNFRACTIONATED): Heparin Unfractionated: 0.15 IU/mL — ABNORMAL LOW (ref 0.30–0.70)

## 2017-05-11 MED ORDER — IOPAMIDOL (ISOVUE-370) INJECTION 76%
INTRAVENOUS | Status: AC
  Administered 2017-05-11: 80 mL
  Filled 2017-05-11: qty 100

## 2017-05-11 MED ORDER — ONDANSETRON HCL 4 MG/2ML IJ SOLN
4.0000 mg | Freq: Four times a day (QID) | INTRAMUSCULAR | Status: DC | PRN
Start: 2017-05-11 — End: 2017-05-15

## 2017-05-11 MED ORDER — TRAMADOL HCL 50 MG PO TABS
50.0000 mg | ORAL_TABLET | Freq: Every day | ORAL | Status: DC | PRN
  Administered 2017-05-12 (×2): 50 mg via ORAL
  Filled 2017-05-11 (×2): qty 1

## 2017-05-11 MED ORDER — ALBUTEROL SULFATE (2.5 MG/3ML) 0.083% IN NEBU: 2.5000 mg | INHALATION_SOLUTION | RESPIRATORY_TRACT | Status: DC | PRN

## 2017-05-11 MED ORDER — ACETAMINOPHEN 325 MG PO TABS: 650.0000 mg | ORAL_TABLET | ORAL | Status: DC | PRN

## 2017-05-11 MED ORDER — HEPARIN (PORCINE) IN NACL 100-0.45 UNIT/ML-% IJ SOLN
900.0000 [IU]/h | INTRAMUSCULAR | Status: DC
  Administered 2017-05-11: 800 [IU]/h via INTRAVENOUS
  Filled 2017-05-11 (×3): qty 250

## 2017-05-11 MED ORDER — LACTATED RINGERS IV SOLN
INTRAVENOUS | Status: DC
  Administered 2017-05-11: 14:00:00 via INTRAVENOUS
  Administered 2017-05-12 – 2017-05-13 (×2): 40 mL/h via INTRAVENOUS

## 2017-05-11 MED ORDER — HEPARIN BOLUS VIA INFUSION
1500.0000 [IU] | Freq: Once | INTRAVENOUS | Status: AC
  Administered 2017-05-11: 1500 [IU] via INTRAVENOUS
  Filled 2017-05-11: qty 1500

## 2017-05-11 MED ORDER — HEPARIN BOLUS VIA INFUSION
3000.0000 [IU] | Freq: Once | INTRAVENOUS | Status: AC
  Administered 2017-05-11: 3000 [IU] via INTRAVENOUS
  Filled 2017-05-11: qty 3000

## 2017-05-11 NOTE — ED Notes (Signed)
Dr.Pickering shown results of Istat troponin. ED-LAb

## 2017-05-11 NOTE — Progress Notes (Signed)
ANTICOAGULATION CONSULT NOTE - Initial Consult  Pharmacy Consult for heparin Indication: pulmonary embolus  Allergies  Allergen Reactions  . Penicillins Rash    Patient Measurements: Weight: 106 lb (48.1 kg) Heparin Dosing Weight: 48.1 kg  Vital Signs: Temp: 98.6 F (37 C) (12/13 1040) Temp Source: Temporal (12/13 1040) BP: 142/94 (12/13 1215) Pulse Rate: 109 (12/13 1215)  Labs: Recent Labs    05/11/17 1000  HGB 14.1  HCT 43.0  PLT 156  CREATININE 1.20*    Estimated Creatinine Clearance: 26 mL/min (A) (by C-G formula based on SCr of 1.2 mg/dL (H)).   Medical History: Past Medical History:  Diagnosis Date  . AKI (acute kidney injury) ([place])    due to NSAID  . Arthritis   . Constipation   . Herniated disc   . Hypertension   . Seasonal allergies   . Spinal stenosis   . Stomach ulcer    due to NSAID  . Tachycardia   . UTI (lower urinary tract infection)      Assessment: CTA showing PE with R heart strain, RV/LV 1.6. Starting heparin gtt. No anticoagulation prior to admission. CBC wnl, SCr 1.2.  Goal of Therapy:  Heparin level 0.3-0.7 units/ml Monitor platelets by anticoagulation protocol: Yes   Plan:  -Heparin bolus 3000 units x1 then 800 units/hr -Daily HL, CBC, level this evening    Harvel Quale 05/11/2017,12:59 PM

## 2017-05-11 NOTE — ED Notes (Signed)
Report attempted 

## 2017-05-11 NOTE — H&P (Signed)
PULMONARY / CRITICAL CARE MEDICINE   Name: Becky Leach MRN: [id number] DOB: [birth year]    ADMISSION DATE:  05/11/2017   CHIEF COMPLAINT: Dyspnea and sweats.  HISTORY OF PRESENT ILLNESS:   This is an 83 year old who awakened this morning with dyspnea and sweats.  She has not had any cough she is not had any chest pain.  She was brought to the department of emergency medicine where she was complaining of tachycardia and dizziness.  A CTA was obtained which shows the presence of a pulmonary embolism with an enlarged RV with an RV to LV ratio of 1.6-1.  She denies any provocation for pulmonary embolism.  She is never suffered from DVT or pulmonary embolism before.  Although she has osteoarthritis she is still able to ambulate and is not bedbound.  She has had no recent prolonged travel she has had no recent surgeries other than an injection of her right knee.  He does not smoke she is not obese and has no known history of cancer. Pertinent to her risk for anticoagulation she did have peptic ulcer disease in 2013 which has not recurred.  She has never had any unusual bleeding, she has not had recent surgery on her eyes brain or back.  PAST MEDICAL HISTORY :  She  has a past medical history of AKI (acute kidney injury) ([place]), Arthritis, Constipation, Herniated disc, Hypertension, Seasonal allergies, Spinal stenosis, Stomach ulcer, Tachycardia, and UTI (lower urinary tract infection).  PAST SURGICAL HISTORY: She  has a past surgical history that includes Dilation and curettage of uterus; Tonsillectomy and adenoidectomy; and Colonoscopy (12/21/2011).  Allergies  Allergen Reactions  . Penicillins Rash    No current facility-administered medications on file prior to encounter.    Current Outpatient Medications on File Prior to Encounter  Medication Sig  . acetaminophen (TYLENOL) 325 MG tablet Take 2 tablets (650 mg total) by mouth every 6 (six) hours as needed for pain.  Marland Kitchen diclofenac sodium  (VOLTAREN) 1 % GEL Apply 2 g topically 4 (four) times daily as needed.  . loratadine (CLARITIN) 10 MG tablet Take 1 tablet (10 mg total) by mouth daily.  . multivitamin-iron-minerals-folic acid (CENTRUM) chewable tablet Chew 1 tablet by mouth daily.  . traMADol (ULTRAM) 50 MG tablet Take 0.5-1 tablet (25-50mg ) ONCE daily as needed for pain.  Marland Kitchen amLODipine (NORVASC) 5 MG tablet Take 0.5 tablets (2.5 mg total) by mouth daily. (Patient not taking: Reported on 05/11/2017)  . fluticasone (FLONASE) 50 MCG/ACT nasal spray Place 2 sprays into both nostrils daily. (Patient not taking: Reported on 05/11/2017)  . olopatadine (PATANOL) 0.1 % ophthalmic solution Place 1 drop into both eyes 2 (two) times daily as needed for allergies. (Patient not taking: Reported on 05/11/2017)    FAMILY HISTORY:  Her indicated that the status of her mother is unknown.   SOCIAL HISTORY: She  reports that  has never smoked. she has never used smokeless tobacco. She reports that she does not drink alcohol or use drugs.  REVIEW OF SYSTEMS:   A 10 system review of systems contributes little, although she has discomfort she is able to ambulate.  She is never had a stroke or seizure, family thinks that her mentation is still quite good.  She has no history of chronic respiratory illness specifically no COPD asthma bronchitis or wheezing.  She has no known prior cardiac disease, no arrhythmias no prior syncopal episodes or chest pain.  No history of diabetes is no history of thyroid disease  and there is no known history of cancer chemotherapy or radiation. SUBJECTIVE:  As above  VITAL SIGNS: BP (!) 142/94   Pulse (!) 109   Temp 98.6 F (37 C) (Temporal)   Resp 19   Wt 106 lb (48.1 kg)   SpO2 99%   BMI 19.39 kg/m   HEMODYNAMICS:    VENTILATOR SETTINGS:    INTAKE / OUTPUT: No intake/output data recorded.  PHYSICAL EXAMINATION: General: This is a very pleasantly communicative elderly female who is in no distress  breathing comfortably on room air. Neuro: She is oriented x3, she has equal reactive pupils with a bilateral arcus senilis, EOMs are full and the face is symmetric.  She is moving all fours.  Cardiovascular: He has no JVD.  She has no heave.  S1 and S2 are regular without murmur rub or gallop. Lungs: Respirations are unlabored, there is symmetric air movement, no wheezes.   Abdomen: Abdomen is thin to scaphoid without any organomegaly masses tenderness guarding or rebound. Musculoskeletal: Appreciate any cords in the lower extremities.  Is no edema.   LABS:  BMET Recent Labs  Lab 05/11/17 1000  NA 141  K 3.9  CL 104  CO2 27  BUN 18  CREATININE 1.20*  GLUCOSE 101*    Electrolytes Recent Labs  Lab 05/11/17 1000  CALCIUM 10.1    CBC Recent Labs  Lab 05/11/17 1000  WBC 8.3  HGB 14.1  HCT 43.0  PLT 156    Coag's No results for input(s): APTT, INR in the last 168 hours.  Sepsis Markers No results for input(s): LATICACIDVEN, PROCALCITON, O2SATVEN in the last 168 hours.  ABG No results for input(s): PHART, PCO2ART, PO2ART in the last 168 hours.  Liver Enzymes No results for input(s): AST, ALT, ALKPHOS, BILITOT, ALBUMIN in the last 168 hours.  Cardiac Enzymes No results for input(s): TROPONINI, PROBNP in the last 168 hours.  Glucose No results for input(s): GLUCAP in the last 168 hours.  Imaging Ct Angio Chest Pe W And/or Wo Contrast  Result Date: 05/11/2017 CLINICAL DATA:  Elevated D-dimer. Onset of tachycardia and dizziness this morning. EXAM: CT ANGIOGRAPHY CHEST WITH CONTRAST TECHNIQUE: Multidetector CT imaging of the chest was performed using the standard protocol during bolus administration of intravenous contrast. Multiplanar CT image reconstructions and MIPs were obtained to evaluate the vascular anatomy. CONTRAST:  80 ISOVUE-370 IOPAMIDOL (ISOVUE-370) INJECTION 76% COMPARISON:  CT chest 12/08/2011. Single view of the chest 05/11/2017. FINDINGS:  Cardiovascular: Extensive bilateral pulmonary emboli are identified including clot in the descending interlobar pulmonary arteries bilaterally and in the right main pulmonary artery. The RV to LV ratio is 1.6 consistent with right heart strain. Heart size is enlarged. No pericardial effusion. Aortic atherosclerosis is noted. Mediastinum/Nodes: No lymphadenopathy. Small hiatal hernia is noted. Thyroid gland appears normal. Lungs/Pleura: No pleural effusion. Small foci of linear atelectasis or scar in the lung bases are noted. No evidence of pulmonary infarct. No nodule or mass. Upper Abdomen: Cyst in the upper pole the right kidney is partially visualized. Musculoskeletal: No fracture or lytic or sclerotic lesion. Review of the MIP images confirms the above findings. IMPRESSION: Positive for acute PE with CT evidence of right heart strain (RV/LV Ratio = 1.6) consistent with at least submassive (intermediate risk) PE. The presence of right heart strain has been associated with an increased risk of morbidity and mortality. Please activate Code PE by paging [phone]. Aortic Atherosclerosis (ICD10-I70.0). Electronically Signed   By: Inge Rise M.D.  On: 05/11/2017 12:43   Dg Chest Portable 1 View  Result Date: 05/11/2017 CLINICAL DATA:  Onset of chest pain and tachycardia this morning. No shortness of breath. History of hypertension, nonsmoker. EXAM: PORTABLE CHEST 1 VIEW COMPARISON:  Chest x-ray of December 02, 2014 and February 27, 2012. FINDINGS: The lungs remain hyperinflated. There is no focal infiltrate. There is no pleural effusion or pneumothorax. The cardiac silhouette remains enlarged. The pulmonary vascularity is normal. There is calcification in the wall of the aortic arch. There is prominent thoracic kyphosis with multilevel degenerative disc disease. IMPRESSION: Chronic bronchitic changes, stable. Mild cardiomegaly without pulmonary vascular congestion or pulmonary edema. Thoracic aortic  atherosclerosis. Electronically Signed   By: David  Martinique M.D.   On: 05/11/2017 11:13     STUDIES:  CTA as noted with pulmonary emboli and increased RV size with an RV to LV ratio of 1.6:1   DISCUSSION: This is an 83 year old with no significant past medical history other than some osteoarthritis which does not keep her immobilized who has presented with dyspnea followed by tachycardia and dizziness.  She has been found to have a pulmonary embolism by CTA with a RV to LV ratio of 1.6-1.  ASSESSMENT / PLAN:  PULMONARY A: This is an unprovoked pulmonary embolism.  She has no bleeding risk factors however the patient's clinical appearance is so good with good saturations on room air that I think it is not acceptable to risk a more invasive approach than systemic anticoagulation.  I will be obtaining an echocardiogram to evaluate her RV function and follow her clinically overnight to see if a change in therapy is appropriate.  I anticipate that she will be switched to a NOAC in the morning and be able to be discharged.   Lars Masson, MD Pulmonary and [place] Pager: [phone]  05/11/2017, 1:22 PM

## 2017-05-11 NOTE — Progress Notes (Signed)
  Echocardiogram 2D Echocardiogram has been performed.  Becky Leach 05/11/2017, 3:55 PM

## 2017-05-11 NOTE — ED Notes (Signed)
Pt aware urine is needed.  

## 2017-05-11 NOTE — ED Provider Notes (Signed)
[name]GENCY DEPARTMENT Provider Note   CSN: [id number] Arrival date & time: 05/11/17  1011     History   Chief Complaint Chief Complaint  Patient presents with  . Dizziness  . Tachycardia    HPI Becky Leach is a 83 y.o. female.  HPI   Pt presents to the to ED c/o dizziness and left jaw pain that began around 730am. Pt states that she attempted to stand up this morning and became dizzy and diaphoretic. She denies any syncope or fall, but does report some SOB and diaphoresis with the episode. The SOB has since resolved. The episode resolved after she sat down and rested for a few minutes .   Since then she has continued to feel dizzy and have left jaw and anterior neck pain. She denies any chest pain. She further denies any palpitations, wheezing, recent fevers, URI sxs, abd pain, or NVD.   She lives at home with her son and states that she can normally complete all ADLs alone. She has felt at baseline over the last few days and has not had any sxs until this morning. She states she has had normal PO intake recently. She has never had an episode like this before.  Past Medical History:  Diagnosis Date  . AKI (acute kidney injury) ([place])    due to NSAID  . Arthritis   . Constipation   . Herniated disc   . Hypertension   . Seasonal allergies   . Spinal stenosis   . Stomach ulcer    due to NSAID  . Tachycardia   . UTI (lower urinary tract infection)     Patient Active Problem List   Diagnosis Date Noted  . Pulmonary embolism ([place]) 05/11/2017  . Need for immunization against influenza 03/18/2017  . Rhinorrhea 03/18/2017  . Impaired ambulation 01/12/2017  . Unilateral primary osteoarthritis, left knee 12/26/2016  . Unilateral primary osteoarthritis, right knee 12/26/2016  . Urge urinary incontinence 11/19/2014  . Pain of left lower extremity 08/28/2014  . Allergic rhinitis 02/04/2014  . Hot flashes 12/12/2013  . Spinal stenosis of lumbar  region 11/14/2013  . Post-menopausal bleeding 12/19/2011  . Diarrhea 12/17/2011  . Benign essential HTN 12/06/2011    Past Surgical History:  Procedure Laterality Date  . COLONOSCOPY  12/21/2011   Procedure: COLONOSCOPY;  Surgeon: Wonda Horner, MD;  Location: [facility] ENDOSCOPY;  Service: Endoscopy;  Laterality: N/A;  . DILATION AND CURETTAGE OF UTERUS    . TONSILLECTOMY AND ADENOIDECTOMY      OB History    Gravida Para Term Preterm AB Living   4 4           SAB TAB Ectopic Multiple Live Births                   Home Medications    Prior to Admission medications   Medication Sig Start Date End Date Taking? Authorizing Provider  acetaminophen (TYLENOL) 325 MG tablet Take 2 tablets (650 mg total) by mouth every 6 (six) hours as needed for pain. 02/27/12  Yes Dhungel, Nishant, MD  diclofenac sodium (VOLTAREN) 1 % GEL Apply 2 g topically 4 (four) times daily as needed. 03/16/17  Yes Guadalupe Dawn, MD  loratadine (CLARITIN) 10 MG tablet Take 1 tablet (10 mg total) by mouth daily. 11/16/15  Yes Ronnie Doss M, DO  multivitamin-iron-minerals-folic acid (CENTRUM) chewable tablet Chew 1 tablet by mouth daily.   Yes [provider]  traMADol Veatrice Bourbon)  50 MG tablet Take 0.5-1 tablet (25-50mg ) ONCE daily as needed for pain. 03/13/17  Yes Guadalupe Dawn, MD  amLODipine (NORVASC) 5 MG tablet Take 0.5 tablets (2.5 mg total) by mouth daily. Patient not taking: Reported on 05/11/2017 05/05/16   Janora Norlander, DO  fluticasone (FLONASE) 50 MCG/ACT nasal spray Place 2 sprays into both nostrils daily. Patient not taking: Reported on 05/11/2017 06/19/15   Janora Norlander, DO  olopatadine (PATANOL) 0.1 % ophthalmic solution Place 1 drop into both eyes 2 (two) times daily as needed for allergies. Patient not taking: Reported on 05/11/2017 08/11/15   Janora Norlander, DO    Family History Family History  Problem Relation Age of Onset  . Arthritis Mother   . Heart attack Mother       Social History Social History   Tobacco Use  . Smoking status: Never Smoker  . Smokeless tobacco: Never Used  Substance Use Topics  . Alcohol use: No  . Drug use: No     Allergies   Penicillins   Review of Systems Review of Systems  Constitutional: Positive for diaphoresis (resolved). Negative for activity change, appetite change, chills, fatigue and fever.  HENT: Negative for congestion, ear pain, rhinorrhea and sore throat.   Eyes: Negative for pain and visual disturbance.  Respiratory: Positive for shortness of breath (resolved). Negative for cough and wheezing.   Cardiovascular: Negative for chest pain, palpitations and leg swelling.  Gastrointestinal: Negative for abdominal pain, constipation, nausea and vomiting.  Genitourinary: Negative for dysuria and hematuria.  Musculoskeletal: Negative for arthralgias and back pain.       Left jaw and left anterior neck pain  Skin: Negative for color change and rash.  Neurological: Positive for dizziness and light-headedness. Negative for seizures, syncope and headaches.  All other systems reviewed and are negative.    Physical Exam Updated Vital Signs BP 106/80   Pulse (!) 102   Temp 98.6 F (37 C) (Temporal)   Resp (!) 28   Wt 48.1 kg (106 lb)   SpO2 95%   BMI 19.39 kg/m   Physical Exam  Constitutional: She is oriented to person, place, and time. She appears well-developed and well-nourished. No distress.  HENT:  Head: Normocephalic and atraumatic.  Eyes: Conjunctivae and EOM are normal. Pupils are equal, round, and reactive to light.  Neck: Neck supple.  Cardiovascular: Normal rate, regular rhythm, normal heart sounds and intact distal pulses.  No murmur heard. Pulmonary/Chest: Effort normal and breath sounds normal. No stridor. No respiratory distress. She has no wheezes. She exhibits no tenderness.  Abdominal: Soft. Bowel sounds are normal. There is no tenderness.  Musculoskeletal: She exhibits no edema.   No TTP the left jaw or remainder of the face or head  Neurological: She is alert and oriented to person, place, and time.  Mental Status:  Alert, thought content appropriate, able to give a coherent history. Speech fluent without evidence of aphasia. Cranial Nerves:  II:  pupils equal, round, reactive to light III,IV, VI: extra-ocular motions intact bilaterally  V,VII: smile symmetric  VIII: hearing grossly normal to voice  XI: bilateral shoulder shrug symmetric and strong Moving all extremities and following commands  Skin: Skin is warm and dry.  Psychiatric: She has a normal mood and affect. Her behavior is normal. Thought content normal.  Nursing note and vitals reviewed.    ED Treatments / Results  Labs (all labs ordered are listed, but only abnormal results are displayed) Labs Reviewed  BASIC  METABOLIC PANEL - Abnormal; Notable for the following components:      Result Value   Glucose, Bld 101 (*)    Creatinine, Ser 1.20 (*)    GFR calc non Af Amer 40 (*)    GFR calc Af Amer 46 (*)    All other components within normal limits  D-DIMER, QUANTITATIVE (NOT AT [facility]) - Abnormal; Notable for the following components:   D-Dimer, Quant 17.18 (*)    All other components within normal limits  URINALYSIS, ROUTINE W REFLEX MICROSCOPIC - Abnormal; Notable for the following components:   Leukocytes, UA SMALL (*)    Squamous Epithelial / LPF 0-5 (*)    All other components within normal limits  I-STAT TROPONIN, ED - Abnormal; Notable for the following components:   Troponin i, poc 0.38 (*)    All other components within normal limits  CBC  HEPARIN LEVEL (UNFRACTIONATED)  HEPARIN LEVEL (UNFRACTIONATED)  CBC    EKG  EKG Interpretation  Date/Time:  Thursday May 11 2017 10:17:51 EST Ventricular Rate:  122 PR Interval:    QRS Duration: 89 QT Interval:  293 QTC Calculation: 418 R Axis:   17 Text Interpretation:  Sinus tachycardia Borderline low voltage, extremity leads  Abnormal R-wave progression, early transition Confirmed by Davonna Belling [phone]) on 05/11/2017 10:20:41 AM Also confirmed by Davonna Belling [phone]), editor Laurena Spies [phone])  on 05/11/2017 11:00:25 AM       Radiology Ct Angio Chest Pe W And/or Wo Contrast  Result Date: 05/11/2017 CLINICAL DATA:  Elevated D-dimer. Onset of tachycardia and dizziness this morning. EXAM: CT ANGIOGRAPHY CHEST WITH CONTRAST TECHNIQUE: Multidetector CT imaging of the chest was performed using the standard protocol during bolus administration of intravenous contrast. Multiplanar CT image reconstructions and MIPs were obtained to evaluate the vascular anatomy. CONTRAST:  80 ISOVUE-370 IOPAMIDOL (ISOVUE-370) INJECTION 76% COMPARISON:  CT chest 12/08/2011. Single view of the chest 05/11/2017. FINDINGS: Cardiovascular: Extensive bilateral pulmonary emboli are identified including clot in the descending interlobar pulmonary arteries bilaterally and in the right main pulmonary artery. The RV to LV ratio is 1.6 consistent with right heart strain. Heart size is enlarged. No pericardial effusion. Aortic atherosclerosis is noted. Mediastinum/Nodes: No lymphadenopathy. Small hiatal hernia is noted. Thyroid gland appears normal. Lungs/Pleura: No pleural effusion. Small foci of linear atelectasis or scar in the lung bases are noted. No evidence of pulmonary infarct. No nodule or mass. Upper Abdomen: Cyst in the upper pole the right kidney is partially visualized. Musculoskeletal: No fracture or lytic or sclerotic lesion. Review of the MIP images confirms the above findings. IMPRESSION: Positive for acute PE with CT evidence of right heart strain (RV/LV Ratio = 1.6) consistent with at least submassive (intermediate risk) PE. The presence of right heart strain has been associated with an increased risk of morbidity and mortality. Please activate Code PE by paging [phone]. Aortic Atherosclerosis (ICD10-I70.0). Electronically  Signed   By: Inge Rise M.D.   On: 05/11/2017 12:43   Dg Chest Portable 1 View  Result Date: 05/11/2017 CLINICAL DATA:  Onset of chest pain and tachycardia this morning. No shortness of breath. History of hypertension, nonsmoker. EXAM: PORTABLE CHEST 1 VIEW COMPARISON:  Chest x-ray of December 02, 2014 and February 27, 2012. FINDINGS: The lungs remain hyperinflated. There is no focal infiltrate. There is no pleural effusion or pneumothorax. The cardiac silhouette remains enlarged. The pulmonary vascularity is normal. There is calcification in the wall of the aortic arch. There is prominent thoracic kyphosis with  multilevel degenerative disc disease. IMPRESSION: Chronic bronchitic changes, stable. Mild cardiomegaly without pulmonary vascular congestion or pulmonary edema. Thoracic aortic atherosclerosis. Electronically Signed   By: David  Martinique M.D.   On: 05/11/2017 11:13    Procedures Procedures (including critical care time)  CRITICAL CARE Performed by: Dr. Davonna Belling and Coral Ceo, [place]  Total critical care time: 40 minutes  Critical care time was exclusive of separately billable procedures and treating other patients.  Critical care was necessary to treat or prevent imminent or life-threatening deterioration.  Critical care was time spent personally by me on the following activities: development of treatment plan with patient and/or surrogate as well as nursing, discussions with consultants, evaluation of patient's response to treatment, examination of patient, obtaining history from patient or surrogate, ordering and performing treatments and interventions, ordering and review of laboratory studies, ordering and review of radiographic studies, pulse oximetry and re-evaluation of patient's condition.   Medications Ordered in ED Medications  heparin ADULT infusion 100 units/mL (25000 units/266mL sodium chloride 0.45%) (800 Units/hr Intravenous New Bag/Given 05/11/17 1328)    lactated ringers infusion ( Intravenous New Bag/Given 05/11/17 1332)  ondansetron (ZOFRAN) injection 4 mg (not administered)  acetaminophen (TYLENOL) tablet 650 mg (not administered)  albuterol (PROVENTIL) (2.5 MG/3ML) 0.083% nebulizer solution 2.5 mg (not administered)  iopamidol (ISOVUE-370) 76 % injection (80 mLs  Contrast Given 05/11/17 1216)  heparin bolus via infusion 3,000 Units (3,000 Units Intravenous Bolus from Bag 05/11/17 1329)     Initial Impression / Assessment and Plan / ED Course  I have reviewed the triage vital signs and the nursing notes.  Pertinent labs & imaging results that were available during my care of the patient were reviewed by me and considered in my medical decision making (see chart for details).   Evaluated pt in the ED in conjunction with Dr. Alvino Chapel. Discussed pt case. Orders placed by Dr. Alvino Chapel.   CTA positive for PE. Dr. Alvino Chapel placed consult for admission.   Pt accepted for admission to step-down unit by Dr. Marolyn Hammock.   Final Clinical Impressions(s) / ED Diagnoses   Final diagnoses:  Other acute pulmonary embolism with acute cor pulmonale ([place])   Pt presented to the ED c/o dizziness and lightheadedness. Trop and d-dimer elevated initially and CTA ordered. CTA confirmed evidence of right heart strain consistent with at least submassive PE. Right heart strain present. ECHO completed and showed increased RV systolic pressure consistent with pulmonary HTN. Pt VS have been stable throughout stay. She is persistently tachycardic in the low 100s. BP stable around 518 systolic and she has been satting in the mid 90s. Afebrile. Consult for admission completed by Dr. Alvino Chapel and pt accepted for admission to stepdown unit by Dr. Marolyn Hammock.   ED Discharge Orders    None       Bishop Dublin 05/11/17 2019    Davonna Belling, MD 05/16/17 0700

## 2017-05-11 NOTE — Progress Notes (Signed)
ANTICOAGULATION CONSULT NOTE - Follow Up Consult  Pharmacy Consult for Heparin Indication: pulmonary embolus  Allergies  Allergen Reactions  . Penicillins Rash    Patient Measurements: Weight: 106 lb (48.1 kg) Heparin Dosing Weight: 48.1 kg  Vital Signs: Temp: 98 F (36.7 C) (12/13 2000) Temp Source: Temporal (12/13 1040) BP: 106/80 (12/13 2000) Pulse Rate: 102 (12/13 2000)  Labs: Recent Labs    05/11/17 1000 05/11/17 2059  HGB 14.1  --   HCT 43.0  --   PLT 156  --   HEPARINUNFRC  --  0.15*  CREATININE 1.20*  --     Estimated Creatinine Clearance: 26 mL/min (A) (by C-G formula based on SCr of 1.2 mg/dL (H)).   Medications:  Infusions:  . heparin 800 Units/hr (05/11/17 1328)  . lactated ringers 40 mL/hr at 05/11/17 1332    Assessment: 83 year old female on IV heparin for acute pulmonary embolism.   Initial heparin level is low at 0.15 after bolus of 3000 units and rate of 800 units/hr.  CBC is within normal limits.  SCr is elevated at 1.21.  No bleeding or issues with infusion per RN, Rush Landmark.   Goal of Therapy:  Heparin level 0.3-0.7 units/ml Monitor platelets by anticoagulation protocol: Yes   Plan:  Re-bolus 1500 units then increase drip to 1000 units/hr.  Repeat heparin level in 8 hours.  Daily heparin level and CBC while on therapy.   Sloan Leiter, PharmD, BCPS, BCCCP Clinical Pharmacist Clinical phone 05/11/2017 until [phone] After hours, please call #[phone] 05/11/2017,10:16 PM

## 2017-05-11 NOTE — ED Notes (Signed)
Meal tray delivered.

## 2017-05-11 NOTE — ED Notes (Signed)
Patient transported to CT 

## 2017-05-11 NOTE — ED Notes (Signed)
Echo at bedside

## 2017-05-11 NOTE — ED Notes (Signed)
Meal tray ordered 

## 2017-05-11 NOTE — ED Triage Notes (Signed)
Pt presents via [facility] for evaluation of tachycardia and dizziness since this AM. Dizziness begins with standing. Pt reports some L neck/jaw pain.

## 2017-05-12 ENCOUNTER — Observation Stay (HOSPITAL_COMMUNITY): Payer: Medicare Other

## 2017-05-12 DIAGNOSIS — I82401 Acute embolism and thrombosis of unspecified deep veins of right lower extremity: Secondary | ICD-10-CM | POA: Diagnosis not present

## 2017-05-12 DIAGNOSIS — D6959 Other secondary thrombocytopenia: Secondary | ICD-10-CM | POA: Diagnosis not present

## 2017-05-12 DIAGNOSIS — I824Y1 Acute embolism and thrombosis of unspecified deep veins of right proximal lower extremity: Secondary | ICD-10-CM | POA: Diagnosis not present

## 2017-05-12 DIAGNOSIS — I2609 Other pulmonary embolism with acute cor pulmonale: Secondary | ICD-10-CM | POA: Diagnosis not present

## 2017-05-12 DIAGNOSIS — I82411 Acute embolism and thrombosis of right femoral vein: Secondary | ICD-10-CM | POA: Diagnosis not present

## 2017-05-12 DIAGNOSIS — Z880 Allergy status to penicillin: Secondary | ICD-10-CM | POA: Diagnosis not present

## 2017-05-12 DIAGNOSIS — T45515A Adverse effect of anticoagulants, initial encounter: Secondary | ICD-10-CM | POA: Diagnosis not present

## 2017-05-12 DIAGNOSIS — N281 Cyst of kidney, acquired: Secondary | ICD-10-CM | POA: Diagnosis present

## 2017-05-12 DIAGNOSIS — I2699 Other pulmonary embolism without acute cor pulmonale: Secondary | ICD-10-CM | POA: Diagnosis not present

## 2017-05-12 DIAGNOSIS — Z8711 Personal history of peptic ulcer disease: Secondary | ICD-10-CM | POA: Diagnosis not present

## 2017-05-12 DIAGNOSIS — Y92230 Patient room in hospital as the place of occurrence of the external cause: Secondary | ICD-10-CM | POA: Diagnosis not present

## 2017-05-12 DIAGNOSIS — Z8261 Family history of arthritis: Secondary | ICD-10-CM | POA: Diagnosis not present

## 2017-05-12 DIAGNOSIS — Z8249 Family history of ischemic heart disease and other diseases of the circulatory system: Secondary | ICD-10-CM | POA: Diagnosis not present

## 2017-05-12 DIAGNOSIS — I2602 Saddle embolus of pulmonary artery with acute cor pulmonale: Secondary | ICD-10-CM | POA: Diagnosis not present

## 2017-05-12 DIAGNOSIS — M1990 Unspecified osteoarthritis, unspecified site: Secondary | ICD-10-CM | POA: Diagnosis present

## 2017-05-12 DIAGNOSIS — N183 Chronic kidney disease, stage 3 (moderate): Secondary | ICD-10-CM | POA: Diagnosis present

## 2017-05-12 DIAGNOSIS — I129 Hypertensive chronic kidney disease with stage 1 through stage 4 chronic kidney disease, or unspecified chronic kidney disease: Secondary | ICD-10-CM | POA: Diagnosis present

## 2017-05-12 LAB — CBC
HCT: 39.3 % (ref 36.0–46.0)
HEMOGLOBIN: 13.2 g/dL (ref 12.0–15.0)
MCH: 28.9 pg (ref 26.0–34.0)
MCHC: 33.6 g/dL (ref 30.0–36.0)
MCV: 86 fL (ref 78.0–100.0)
Platelets: 151 10*3/uL (ref 150–400)
RBC: 4.57 MIL/uL (ref 3.87–5.11)
RDW: 14.9 % (ref 11.5–15.5)
WBC: 6.7 10*3/uL (ref 4.0–10.5)

## 2017-05-12 LAB — MRSA PCR SCREENING: MRSA BY PCR: NEGATIVE

## 2017-05-12 LAB — HEPARIN LEVEL (UNFRACTIONATED)
HEPARIN UNFRACTIONATED: 1.02 [IU]/mL — AB (ref 0.30–0.70)
HEPARIN UNFRACTIONATED: 1.4 [IU]/mL — AB (ref 0.30–0.70)

## 2017-05-12 MED ORDER — LORATADINE 10 MG PO TABS
10.0000 mg | ORAL_TABLET | Freq: Every day | ORAL | Status: DC
  Administered 2017-05-12 – 2017-05-15 (×4): 10 mg via ORAL
  Filled 2017-05-12 (×4): qty 1

## 2017-05-12 MED ORDER — HEPARIN (PORCINE) IN NACL 100-0.45 UNIT/ML-% IJ SOLN
650.0000 [IU]/h | INTRAMUSCULAR | Status: DC
  Administered 2017-05-12: 800 [IU]/h via INTRAVENOUS
  Filled 2017-05-12: qty 250

## 2017-05-12 MED ORDER — OLOPATADINE HCL 0.1 % OP SOLN
1.0000 [drp] | Freq: Two times a day (BID) | OPHTHALMIC | Status: DC
  Administered 2017-05-12 – 2017-05-15 (×6): 1 [drp] via OPHTHALMIC
  Filled 2017-05-12 (×2): qty 5

## 2017-05-12 NOTE — Progress Notes (Signed)
PULMONARY / CRITICAL CARE MEDICINE   Name: Becky Leach MRN: [id number] DOB: 1929-06-15    ADMISSION DATE:  05/11/2017   CHIEF COMPLAINT: Dyspnea and sweats.  HISTORY OF PRESENT ILLNESS:   This is an 83 year old who awakened 12/13 with dyspnea and sweats.  She has not had any cough she is not had any chest pain.  She was brought to the department of emergency medicine where she was complaining of tachycardia and dizziness.  A CTA was obtained which shows the presence of a pulmonary embolism with an enlarged RV with an RV to LV ratio of 1.6-1.  She denies any provocation for pulmonary embolism.  She is never suffered from DVT or pulmonary embolism before.  Although she has osteoarthritis she is still able to ambulate and is not bedbound.  She has had no recent prolonged travel she has had no recent surgeries other than an injection of her right knee.  He does not smoke she is not obese and has no known history of cancer. Pertinent to her risk for anticoagulation she did have peptic ulcer disease in 2013 which has not recurred.  She has never had any unusual bleeding, she has not had recent surgery on her eyes brain or back.  SUBJECTIVE:  Feeling much better today.  VITAL SIGNS: BP 135/89   Pulse 68   Temp 97.6 F (36.4 C) (Oral)   Resp 19   Ht 5\' 5"  (1.651 m)   Wt 46.3 kg (102 lb 1.2 oz)   SpO2 97%   BMI 16.99 kg/m   HEMODYNAMICS:    VENTILATOR SETTINGS:    INTAKE / OUTPUT: I/O last 3 completed shifts: In: 1005.8 [P.O.:300; I.V.:705.8] Out: 550 [Urine:550]  PHYSICAL EXAMINATION: General:  Frail elderly female in NAD Neuro:  Alert, oriented, non-focal HEENT:  [place]/AT, No JVD noted, PERRL Cardiovascular:  RRR, no MRG Lungs:  Clear bilateral breath sounds. Unlabored. On room air. Abdomen:  Soft, non-distended, non-tender. Musculoskeletal:  No acute deformity or ROM limitation.  Skin:  Intact, MMM  LABS:  BMET Recent Labs  Lab 05/11/17 1000  NA 141  K 3.9  CL 104   CO2 27  BUN 18  CREATININE 1.20*  GLUCOSE 101*    Electrolytes Recent Labs  Lab 05/11/17 1000  CALCIUM 10.1    CBC Recent Labs  Lab 05/11/17 1000 05/12/17 0348  WBC 8.3 6.7  HGB 14.1 13.2  HCT 43.0 39.3  PLT 156 151    Coag's No results for input(s): APTT, INR in the last 168 hours.  Sepsis Markers No results for input(s): LATICACIDVEN, PROCALCITON, O2SATVEN in the last 168 hours.  ABG No results for input(s): PHART, PCO2ART, PO2ART in the last 168 hours.  Liver Enzymes No results for input(s): AST, ALT, ALKPHOS, BILITOT, ALBUMIN in the last 168 hours.  Cardiac Enzymes No results for input(s): TROPONINI, PROBNP in the last 168 hours.  Glucose No results for input(s): GLUCAP in the last 168 hours.  Imaging Ct Angio Chest Pe W And/or Wo Contrast  Result Date: 05/11/2017 CLINICAL DATA:  Elevated D-dimer. Onset of tachycardia and dizziness this morning. EXAM: CT ANGIOGRAPHY CHEST WITH CONTRAST TECHNIQUE: Multidetector CT imaging of the chest was performed using the standard protocol during bolus administration of intravenous contrast. Multiplanar CT image reconstructions and MIPs were obtained to evaluate the vascular anatomy. CONTRAST:  80 ISOVUE-370 IOPAMIDOL (ISOVUE-370) INJECTION 76% COMPARISON:  CT chest 12/08/2011. Single view of the chest 05/11/2017. FINDINGS: Cardiovascular: Extensive bilateral pulmonary emboli are identified including  clot in the descending interlobar pulmonary arteries bilaterally and in the right main pulmonary artery. The RV to LV ratio is 1.6 consistent with right heart strain. Heart size is enlarged. No pericardial effusion. Aortic atherosclerosis is noted. Mediastinum/Nodes: No lymphadenopathy. Small hiatal hernia is noted. Thyroid gland appears normal. Lungs/Pleura: No pleural effusion. Small foci of linear atelectasis or scar in the lung bases are noted. No evidence of pulmonary infarct. No nodule or mass. Upper Abdomen: Cyst in the upper  pole the right kidney is partially visualized. Musculoskeletal: No fracture or lytic or sclerotic lesion. Review of the MIP images confirms the above findings. IMPRESSION: Positive for acute PE with CT evidence of right heart strain (RV/LV Ratio = 1.6) consistent with at least submassive (intermediate risk) PE. The presence of right heart strain has been associated with an increased risk of morbidity and mortality. Please activate Code PE by paging [phone]. Aortic Atherosclerosis (ICD10-I70.0). Electronically Signed   By: Inge Rise M.D.   On: 05/11/2017 12:43   Dg Chest Portable 1 View  Result Date: 05/11/2017 CLINICAL DATA:  Onset of chest pain and tachycardia this morning. No shortness of breath. History of hypertension, nonsmoker. EXAM: PORTABLE CHEST 1 VIEW COMPARISON:  Chest x-ray of December 02, 2014 and February 27, 2012. FINDINGS: The lungs remain hyperinflated. There is no focal infiltrate. There is no pleural effusion or pneumothorax. The cardiac silhouette remains enlarged. The pulmonary vascularity is normal. There is calcification in the wall of the aortic arch. There is prominent thoracic kyphosis with multilevel degenerative disc disease. IMPRESSION: Chronic bronchitic changes, stable. Mild cardiomegaly without pulmonary vascular congestion or pulmonary edema. Thoracic aortic atherosclerosis. Electronically Signed   By: David  [place]ique M.D.   On: 05/11/2017 11:13     STUDIES:  CTA as noted with pulmonary emboli and increased RV size with an RV to LV ratio of 1.6:1   DISCUSSION: This is an 83 year old with no significant past medical history other than some osteoarthritis which does not keep her immobilized who has presented with dyspnea followed by tachycardia and dizziness.  She has been found to have a pulmonary embolism by CTA with a RV to LV ratio of 1.6-1. Treated with heparin and admitted to telemetry floor.   ASSESSMENT / PLAN:  Pulmonary embolism: This is an unprovoked  pulmonary embolism.  She has no bleeding risk factors, however, the patient's clinical appearance is good. She is comfortable on room air and is hemodynamically stable.  - Continue heparin - Transition to [place] 12/15 AM - Echo and Dopplers pending - Will need anticoagulation for at least 6 months.   HTN: - Continue home amlodipine   Diet: Heart health VTE ppx: Heparin full dose Code: Full Dispo: SDU Anticipated dicharge 12/16  Patient updated 12/14  Georgann Housekeeper, AGACNP-BC [facility] Pulmonology/Critical Care Pager [phone] or [phone]  05/12/2017 11:09 AM

## 2017-05-12 NOTE — Progress Notes (Signed)
LE venous duplex prelim: DVT noted in the right femoral vein and common femoral vein, not extending up to saphenofemoral junction. No apparent DVT of the LLE. Landry Mellow, RDMS, RVT Called results to [place].

## 2017-05-12 NOTE — Progress Notes (Signed)
ANTICOAGULATION CONSULT NOTE - Follow Up Consult  Pharmacy Consult for Heparin Indication: pulmonary embolus  Allergies  Allergen Reactions  . Penicillins Rash    Patient Measurements: Height: 5\' 5"  (165.1 cm) Weight: 102 lb 1.2 oz (46.3 kg) IBW/kg (Calculated) : 57 Heparin Dosing Weight: 48.1 kg  Vital Signs: Temp: 97.6 F (36.4 C) (12/14 1305) Temp Source: Axillary (12/14 1305) BP: 134/86 (12/14 1305) Pulse Rate: 85 (12/14 1305)  Labs: Recent Labs    05/11/17 1000 05/11/17 2059 05/12/17 0348 05/12/17 1433  HGB 14.1  --  13.2  --   HCT 43.0  --  39.3  --   PLT 156  --  151  --   HEPARINUNFRC  --  0.15* 1.02* 1.40*  CREATININE 1.20*  --   --   --     Estimated Creatinine Clearance: 25.1 mL/min (A) (by C-G formula based on SCr of 1.2 mg/dL (H)).   Medications:  Infusions:  . heparin    . lactated ringers 40 mL/hr at 05/11/17 1332    Assessment: 83 year old female on IV heparin for acute pulmonary embolism. Initial level was low at 0.15 on 800 mL/hr. Repeat level after rate increase came back supratherapeutic at 1.02. Heparin was held for one hour and the rate was decreased. Next heparin level this afternoon came back even higher at 1.4. CBC is within normal limits. Heparin is running in L arm and lab was drawn from opposite arm per nursing. No infusion issues noted. Nursing noted slight bleeding around IV site.   Goal of Therapy:  Heparin level 0.3-0.7 units/ml Monitor platelets by anticoagulation protocol: Yes   Plan:  Hold heparin infusion for 1 hour Reduce heparin infusion to 800 units/hr  Repeat heparin level in 8 hours.  Daily heparin level and CBC while on therapy.  Follow up on transition to oral anticoagulation (notes indicate potentially tomorrow)  Doylene Canard, PharmD Clinical Pharmacist  Pager: [phone] Clinical Phone for 05/12/2017 until 3:30pm: [phone] If after 3:30pm, please call main pharmacy at [phone] 05/12/2017,4:02 PM

## 2017-05-12 NOTE — Progress Notes (Signed)
ANTICOAGULATION CONSULT NOTE - Follow Up Consult  Pharmacy Consult for heparin Indication: pulmonary embolus  Labs: Recent Labs    05/11/17 1000 05/11/17 2059 05/12/17 0348  HGB 14.1  --  13.2  HCT 43.0  --  39.3  PLT 156  --  151  HEPARINUNFRC  --  0.15* 1.02*  CREATININE 1.20*  --   --     Assessment: 83yo female now above goal on heparin after rate change.  Goal of Therapy:  Heparin level 0.3-0.7 units/ml   Plan:  Will decrease heparin gtt to 900 units/hr (between previous two rates) and check level in [place], PharmD, BCPS  05/12/2017,6:04 AM

## 2017-05-13 ENCOUNTER — Inpatient Hospital Stay (HOSPITAL_COMMUNITY): Payer: Medicare Other

## 2017-05-13 DIAGNOSIS — I82411 Acute embolism and thrombosis of right femoral vein: Secondary | ICD-10-CM

## 2017-05-13 DIAGNOSIS — I2609 Other pulmonary embolism with acute cor pulmonale: Principal | ICD-10-CM

## 2017-05-13 LAB — CBC
HEMATOCRIT: 37.2 % (ref 36.0–46.0)
Hemoglobin: 12.9 g/dL (ref 12.0–15.0)
MCH: 30 pg (ref 26.0–34.0)
MCHC: 34.7 g/dL (ref 30.0–36.0)
MCV: 86.5 fL (ref 78.0–100.0)
Platelets: 143 10*3/uL — ABNORMAL LOW (ref 150–400)
RBC: 4.3 MIL/uL (ref 3.87–5.11)
RDW: 15 % (ref 11.5–15.5)
WBC: 5.8 10*3/uL (ref 4.0–10.5)

## 2017-05-13 LAB — BASIC METABOLIC PANEL
ANION GAP: 6 (ref 5–15)
BUN: 14 mg/dL (ref 6–20)
CHLORIDE: 108 mmol/L (ref 101–111)
CO2: 25 mmol/L (ref 22–32)
Calcium: 9 mg/dL (ref 8.9–10.3)
Creatinine, Ser: 1.01 mg/dL — ABNORMAL HIGH (ref 0.44–1.00)
GFR calc Af Amer: 57 mL/min — ABNORMAL LOW (ref >60)
GFR calc non Af Amer: 49 mL/min — ABNORMAL LOW (ref >60)
GLUCOSE: 100 mg/dL — AB (ref 65–99)
POTASSIUM: 4.3 mmol/L (ref 3.5–5.1)
Sodium: 139 mmol/L (ref 135–145)

## 2017-05-13 LAB — HEPARIN LEVEL (UNFRACTIONATED)
HEPARIN UNFRACTIONATED: 0.99 [IU]/mL — AB (ref 0.30–0.70)
Heparin Unfractionated: 1.05 IU/mL — ABNORMAL HIGH (ref 0.30–0.70)
Heparin Unfractionated: 0.32 IU/mL (ref 0.30–0.70)

## 2017-05-13 LAB — GLUCOSE, CAPILLARY: Glucose-Capillary: 88 mg/dL (ref 65–99)

## 2017-05-13 MED ORDER — IOPAMIDOL (ISOVUE-300) INJECTION 61%
INTRAVENOUS | Status: AC
  Filled 2017-05-13: qty 30

## 2017-05-13 MED ORDER — IOPAMIDOL (ISOVUE-300) INJECTION 61%
INTRAVENOUS | Status: AC
  Filled 2017-05-13: qty 100

## 2017-05-13 MED ORDER — IOPAMIDOL (ISOVUE-300) INJECTION 61%
INTRAVENOUS | Status: AC
  Administered 2017-05-13: 75 mL
  Filled 2017-05-13: qty 75

## 2017-05-13 NOTE — Progress Notes (Signed)
ANTICOAGULATION CONSULT NOTE - Follow Up Consult  Pharmacy Consult for heparin Indication: pulmonary embolus  Labs: Recent Labs    05/11/17 1000  05/12/17 0348  05/13/17 0119 05/13/17 1045 05/13/17 2034  HGB 14.1  --  13.2  --  12.9  --   --   HCT 43.0  --  39.3  --  37.2  --   --   PLT 156  --  151  --  143*  --   --   HEPARINUNFRC  --    < > 1.02*   < > 1.05* 0.99* 0.32  CREATININE 1.20*  --   --   --  1.01*  --   --    < > = values in this interval not displayed.    Assessment/Plan:  83yo female therapeutic on heparin after rate changes. Will continue gtt at current rate and confirm stable with am labs.   Wynona Neat, PharmD, BCPS  05/13/2017,11:58 PM

## 2017-05-13 NOTE — Progress Notes (Signed)
PROGRESS NOTE    Becky Leach  VVO:[id number] DOB: 15-Nov-1929 DOA: 05/11/2017 PCP: Becky Dawn, MD  Outpatient Specialists:     Brief Narrative:  Admitted with extensive bilateral PE, with right heart strain. Patient has RLE DVT. Patient is on heparin drip. Patient was transferred out of the ICU.   Assessment & Plan:   Active Problems:   Pulmonary embolism (HCC) DVT RLE   Continue Heparin for now Gradually transition to oral anticoagulation   DVT prophylaxis: Heparin drip Code Status: Full Family Communication: Son Disposition Plan: Home eventually   Consultants:   None  Procedures:   None  Antimicrobials:   None   Subjective: No new complaints. No SOB. No chest pain. BP is stable.  Objective: Vitals:   05/13/17 0742 05/13/17 1224 05/13/17 1422 05/13/17 1638  BP: (!) 142/97 (!) 156/104 128/73 (!) 141/92  Pulse: 71 80 (!) 56 (!) 103  Resp: (!) 28 (!) 28 17 (!) 29  Temp: 98 F (36.7 C) 97.6 F (36.4 C) 97.8 F (36.6 C) 98.5 F (36.9 C)  TempSrc: Oral Oral Oral Oral  SpO2: 94% 92% 97% 96%  Weight:      Height:        Intake/Output Summary (Last 24 hours) at 05/13/2017 1724 Last data filed at 05/13/2017 1345 Gross per 24 hour  Intake 1499.68 ml  Output 750 ml  Net 749.68 ml   Filed Weights   05/11/17 2300 05/12/17 0414 05/13/17 0423  Weight: 46.6 kg (102 lb 11.8 oz) 46.3 kg (102 lb 1.2 oz) 47 kg (103 lb 11.2 oz)    Examination:  General exam: Appears calm and comfortable  Respiratory system: Clear to auscultation. Respiratory effort normal. Cardiovascular system: S1 & S2. Gastrointestinal system: Abdomen is nondistended, soft and nontender. No organomegaly or masses felt. Normal bowel sounds heard. Central nervous system: Alert and oriented. No focal neurological deficits. Extremities: No leg edema Skin: No rashes, lesions or ulcers Psychiatry: Judgement and insight appear normal. Mood & affect appropriate.     Data Reviewed: I  have personally reviewed following labs and imaging studies  CBC: Recent Labs  Lab 05/11/17 1000 05/12/17 0348 05/13/17 0119  WBC 8.3 6.7 5.8  HGB 14.1 13.2 12.9  HCT 43.0 39.3 37.2  MCV 87.9 86.0 86.5  PLT 156 151 269*   Basic Metabolic Panel: Recent Labs  Lab 05/11/17 1000 05/13/17 0119  NA 141 139  K 3.9 4.3  CL 104 108  CO2 27 25  GLUCOSE 101* 100*  BUN 18 14  CREATININE 1.20* 1.01*  CALCIUM 10.1 9.0   GFR: Estimated Creatinine Clearance: 30.2 mL/min (A) (by C-G formula based on SCr of 1.01 mg/dL (H)). Liver Function Tests: No results for input(s): AST, ALT, ALKPHOS, BILITOT, PROT, ALBUMIN in the last 168 hours. No results for input(s): LIPASE, AMYLASE in the last 168 hours. No results for input(s): AMMONIA in the last 168 hours. Coagulation Profile: No results for input(s): INR, PROTIME in the last 168 hours. Cardiac Enzymes: No results for input(s): CKTOTAL, CKMB, CKMBINDEX, TROPONINI in the last 168 hours. BNP (last 3 results) No results for input(s): PROBNP in the last 8760 hours. HbA1C: No results for input(s): HGBA1C in the last 72 hours. CBG: Recent Labs  Lab 05/13/17 0745  GLUCAP 88   Lipid Profile: No results for input(s): CHOL, HDL, LDLCALC, TRIG, CHOLHDL, LDLDIRECT in the last 72 hours. Thyroid Function Tests: No results for input(s): TSH, T4TOTAL, FREET4, T3FREE, THYROIDAB in the last 72 hours. Anemia  Panel: No results for input(s): VITAMINB12, FOLATE, FERRITIN, TIBC, IRON, RETICCTPCT in the last 72 hours. Urine analysis:    Component Value Date/Time   COLORURINE YELLOW 05/11/2017 1112   APPEARANCEUR CLEAR 05/11/2017 1112   LABSPEC 1.027 05/11/2017 1112   PHURINE 7.0 05/11/2017 [place] 05/11/2017 1112   HGBUR NEGATIVE 05/11/2017 1112   BILIRUBINUR NEGATIVE 05/11/2017 1112   KETONESUR NEGATIVE 05/11/2017 1112   PROTEINUR NEGATIVE 05/11/2017 1112   UROBILINOGEN 0.2 12/02/2014 1543   NITRITE NEGATIVE 05/11/2017 1112    LEUKOCYTESUR SMALL (A) 05/11/2017 1112   Sepsis Labs: @LABRCNTIP (procalcitonin:4,lacticidven:4)  ) Recent Results (from the past 240 hour(s))  MRSA PCR Screening     Status: None   Collection Time: 05/11/17 10:45 PM  Result Value Ref Range Status   MRSA by PCR NEGATIVE NEGATIVE Final    Comment:        The GeneXpert MRSA Assay (FDA approved for NASAL specimens only), is one component of a comprehensive MRSA colonization surveillance program. It is not intended to diagnose MRSA infection nor to guide or monitor treatment for MRSA infections.          Radiology Studies: Ct Venogram Abd/pel  Result Date: 05/13/2017 CLINICAL DATA:  Pulmonary embolism. EXAM: CT VENOGRAM ABD-PELVIS TECHNIQUE: Multidetector CT imaging of the abdomen and pelvis was performed using the standard protocol during bolus administration of intravenous contrast. Multiplanar reconstructed images and MIPs were obtained and reviewed to evaluate the vascular anatomy. CONTRAST:  30mL ISOVUE-300 IOPAMIDOL (ISOVUE-300) INJECTION 61% COMPARISON:  CTA of the chest on 05/11/2017, CT of the abdomen and pelvis on 12/18/2011 FINDINGS: VASCULAR The study was performed as a CT venogram. The IVC and iliac vein show normal patency without evidence of thrombus. There is evidence of nonocclusive thrombus within the lower segment of the right common femoral vein likely extending into the orifices of the profunda femoral and femoral veins. The abdominal aorta is very tortuous. No evidence of abdominal aortic aneurysm. Review of the MIP images confirms the above findings. NON-VASCULAR Lower chest: Atelectasis present at both lung bases. Cystic structure at the diaphragmatic hiatus just to the right of the distal esophagus has a stable appearance compared to a prior study in 2013 and measures approximately 2.6 x 1.6 cm. 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 Hepatobiliary: No focal liver abnormality is seen. No  gallstones, gallbladder wall thickening, or biliary dilatation. Pancreas: Unremarkable. No pancreatic ductal dilatation or surrounding inflammatory changes. Spleen: Normal in size without focal abnormality. Adrenals/Urinary Tract: Adrenal glands are unremarkable. No hydronephrosis or solid renal masses. An upper pole right renal cyst has internal density consistent with a simple cyst. This has enlarged since 2013 and measures 2.3 cm in diameter compared to approximately 1 cm previously. Bladder is unremarkable. Stomach/Bowel: Bowel shows no evidence of obstruction, ileus or inflammation. No bowel lesions identified. Lymphatic: No enlarged lymph nodes identified in the abdomen or pelvis. Reproductive: Uterus and bilateral adnexa are unremarkable. Other: No abdominal wall hernia or abnormality. No abdominopelvic ascites. Musculoskeletal: Degenerative disc disease present of the lumbar spine. IMPRESSION: VASCULAR Nonocclusive DVT in the inferior aspect of the right common femoral vein likely extending into the right femoral and profunda femoral veins. No evidence of iliac vein or IVC thrombus. NON-VASCULAR 1. 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 2. Enlargement of upper pole renal cyst on the right since 2013. This has the appearance of a  simple cyst. Electronically Signed   By: Aletta Edouard M.D.   On: 05/13/2017 15:11        Scheduled Meds: . loratadine  10 mg Oral Daily  . olopatadine  1 drop Both Eyes BID   Continuous Infusions: . heparin 550 Units/hr (05/13/17 1302)  . lactated ringers 40 mL/hr (05/12/17 2116)     LOS: 1 day    Time spent: 58 Minutes    Dana Allan, MD  [facility] Pager #: [phone] 7PM-7AM contact night coverage as above

## 2017-05-13 NOTE — Progress Notes (Signed)
ANTICOAGULATION CONSULT NOTE - Follow Up Consult  Pharmacy Consult for Heparin Indication: pulmonary embolus  Allergies  Allergen Reactions  . Penicillins Rash    Patient Measurements: Height: 5\' 5"  (165.1 cm) Weight: 103 lb 11.2 oz (47 kg) IBW/kg (Calculated) : 57 Heparin Dosing Weight: 48.1 kg  Vital Signs: Temp: 98 F (36.7 C) (12/15 0742) Temp Source: Oral (12/15 0742) BP: 142/97 (12/15 0742) Pulse Rate: 71 (12/15 0742)  Labs: Recent Labs    05/11/17 1000  05/12/17 0348 05/12/17 1433 05/13/17 0119 05/13/17 1045  HGB 14.1  --  13.2  --  12.9  --   HCT 43.0  --  39.3  --  37.2  --   PLT 156  --  151  --  143*  --   HEPARINUNFRC  --    < > 1.02* 1.40* 1.05* 0.99*  CREATININE 1.20*  --   --   --  1.01*  --    < > = values in this interval not displayed.    Estimated Creatinine Clearance: 30.2 mL/min (A) (by C-G formula based on SCr of 1.01 mg/dL (H)).   Medications:  Infusions:  . heparin 650 Units/hr (05/13/17 0318)  . lactated ringers 40 mL/hr (05/12/17 2116)    Assessment: 83 year old female on IV heparin for acute pulmonary embolism. Initial level was low at 0.15 on 800 mL/hr. Repeat level after rate increase came back supratherapeutic at 1.02. Heparin was held for one hour and the rate was decreased.  Heparin level remained high this AM and was decreased by 3 units/kg/hr.  Heparin level supratherapeutic at 0.99 on 650 units/hr (downtrend), and some bleeding noted around antecubital but not forearm access; RN attributes to pt movement and bleeding resolved quickly.  CBC stable.  Goal of Therapy:  Heparin level 0.3-0.7 units/ml Monitor platelets by anticoagulation protocol: Yes   Plan:  Decrease heparin gtt by 2 units/kg/hr to 550 units/hr Repeat heparin level in 8 hours.  Daily heparin level and CBC while on therapy. Follow up on transition to oral anticoagulation, possibly today  Bertis Ruddy, PharmD Pharmacy Resident Pager #:  [phone] 05/13/2017 12:27 PM

## 2017-05-13 NOTE — Progress Notes (Signed)
ANTICOAGULATION CONSULT NOTE - Follow Up Consult  Pharmacy Consult for heparin Indication: pulmonary embolus  Labs: Recent Labs    05/11/17 1000  05/12/17 0348 05/12/17 1433 05/13/17 0119  HGB 14.1  --  13.2  --  12.9  HCT 43.0  --  39.3  --  37.2  PLT 156  --  151  --  143*  HEPARINUNFRC  --    < > 1.02* 1.40* 1.05*  CREATININE 1.20*  --   --   --  1.01*   < > = values in this interval not displayed.    Assessment: 83yo female remains above goal on heparin after rate change; no further signs of bleeding per RN.  Goal of Therapy:  Heparin level 0.3-0.7 units/ml   Plan:  Will decrease heparin gtt by 3 units/kg/hr to 650 units/hr and check level in [place], PharmD, BCPS  05/13/2017,3:13 AM

## 2017-05-14 ENCOUNTER — Other Ambulatory Visit: Payer: Self-pay | Admitting: Family Medicine

## 2017-05-14 DIAGNOSIS — M79605 Pain in left leg: Secondary | ICD-10-CM

## 2017-05-14 LAB — CBC
HCT: 39 % (ref 36.0–46.0)
HEMOGLOBIN: 13.2 g/dL (ref 12.0–15.0)
MCH: 29 pg (ref 26.0–34.0)
MCHC: 33.8 g/dL (ref 30.0–36.0)
MCV: 85.7 fL (ref 78.0–100.0)
Platelets: 142 10*3/uL — ABNORMAL LOW (ref 150–400)
RBC: 4.55 MIL/uL (ref 3.87–5.11)
RDW: 14.7 % (ref 11.5–15.5)
WBC: 5.6 10*3/uL (ref 4.0–10.5)

## 2017-05-14 LAB — HEPARIN LEVEL (UNFRACTIONATED): HEPARIN UNFRACTIONATED: 0.22 [IU]/mL — AB (ref 0.30–0.70)

## 2017-05-14 MED ORDER — HEPARIN BOLUS VIA INFUSION
500.0000 [IU] | Freq: Once | INTRAVENOUS | Status: AC
  Administered 2017-05-14: 500 [IU] via INTRAVENOUS
  Filled 2017-05-14: qty 500

## 2017-05-14 MED ORDER — APIXABAN 5 MG PO TABS
10.0000 mg | ORAL_TABLET | Freq: Two times a day (BID) | ORAL | Status: DC
  Administered 2017-05-14 – 2017-05-15 (×3): 10 mg via ORAL
  Filled 2017-05-14 (×4): qty 2

## 2017-05-14 NOTE — Progress Notes (Signed)
ANTICOAGULATION CONSULT NOTE - Follow Up Consult  Pharmacy Consult for heparin Indication: pulmonary embolus  Labs: Recent Labs    05/11/17 1000  05/12/17 0348  05/13/17 0119 05/13/17 1045 05/13/17 2034 05/14/17 0513  HGB 14.1  --  13.2  --  12.9  --   --  13.2  HCT 43.0  --  39.3  --  37.2  --   --  39.0  PLT 156  --  151  --  143*  --   --  142*  HEPARINUNFRC  --    < > 1.02*   < > 1.05* 0.99* 0.32 0.22*  CREATININE 1.20*  --   --   --  1.01*  --   --   --    < > = values in this interval not displayed.    Assessment:  83yo female on heparin per pharmacy for extensive PE with heart strain and RLE DVT.   AM heparin level subtherapeutic at 0.22 and no infusion issues per RN. CBC stable and no s/s bleeding reported.   Plan:  Heparin bolus 500 units IV x1  Increase heparin gtt to 650 units/hr  Heparin level in 8 hrs Daily heparin level and CBC Monitor for s/s bleeding   Lavonda Jumbo, PharmD Clinical Pharmacist 05/14/17 6:43 AM

## 2017-05-14 NOTE — Progress Notes (Signed)
ANTICOAGULATION CONSULT NOTE - Initial Consult  Pharmacy Consult for Eliquis Indication: pulmonary embolus  Allergies  Allergen Reactions  . Penicillins Rash    Patient Measurements: Height: 5\' 5"  (165.1 cm) Weight: 103 lb (46.7 kg) IBW/kg (Calculated) : 57  Vital Signs: Temp: 98.1 F (36.7 C) (12/16 0734) Temp Source: Oral (12/16 0734) BP: 117/64 (12/16 0734) Pulse Rate: 92 (12/16 0734)  Labs: Recent Labs    05/12/17 0348  05/13/17 0119 05/13/17 1045 05/13/17 2034 05/14/17 0513  HGB 13.2  --  12.9  --   --  13.2  HCT 39.3  --  37.2  --   --  39.0  PLT 151  --  143*  --   --  142*  HEPARINUNFRC 1.02*   < > 1.05* 0.99* 0.32 0.22*  CREATININE  --   --  1.01*  --   --   --    < > = values in this interval not displayed.    Estimated Creatinine Clearance: 30 mL/min (A) (by C-G formula based on SCr of 1.01 mg/dL (H)).   Medical History: Past Medical History:  Diagnosis Date  . AKI (acute kidney injury) ([place])    due to NSAID  . Arthritis   . Constipation   . Herniated disc   . Hypertension   . Seasonal allergies   . Spinal stenosis   . Stomach ulcer    due to NSAID  . Tachycardia   . UTI (lower urinary tract infection)     Medications:  Scheduled:  . apixaban  10 mg Oral BID  . loratadine  10 mg Oral Daily  . olopatadine  1 drop Both Eyes BID    Assessment: 86 YOF with bilateral PE with heart strain and RLE DVT.  Initially started on heparin and now transitioning to Eliquis PO for treatment of PE.  Goal of Therapy:  Monitor platelets by anticoagulation protocol: Yes   Plan:  Stop heparin gtt  Initiate Eliquis 10mg  PO BID x 7 days, then 5mg  PO BID thereafter (outpt f/u to decide total LOT) Monitor renal function, CBC, s/s bleeding  Bertis Ruddy M 05/14/2017,11:44 AM

## 2017-05-14 NOTE — Progress Notes (Signed)
PROGRESS NOTE    Becky Leach  EPP:[id number] DOB: [birth year] DOA: 05/11/2017 PCP: Guadalupe Dawn, MD  Outpatient Specialists:     Brief Narrative:  Admitted with extensive bilateral PE, with right heart strain. Patient has RLE DVT. Patient was on heparin drip, but discontinued this morning and Eliquis started. Patient was transferred out of the ICU.  Assessment & Plan:   Active Problems:   Pulmonary embolism (HCC) DVT RLE   DC Heparin drip Start Eliquis 10mg  po bid for 7 days and then decrease to 5mg  po bid. Monitor for bleeding for now Discussed with patient and the son re possible complications of anticoagulation, particularly newer agents and they voiced understanding. Patient's Nurse was present during this discussion.   DVT prophylaxis: Eliquis Code Status: Full Family Communication: Son Disposition Plan: Home eventually   Consultants:   None  Procedures:   None  Antimicrobials:   None   Subjective: No new complaints. No SOB. No chest pain. BP is stable.  Objective: Vitals:   05/13/17 2016 05/14/17 0014 05/14/17 0354 05/14/17 0734  BP: 128/89 (!) 156/109 (!) 140/99 117/64  Pulse: 94 75 76 92  Resp: (!) 23 (!) 23 (!) 27 (!) 22  Temp: 99.4 F (37.4 C) 98.7 F (37.1 C) 99 F (37.2 C) 98.1 F (36.7 C)  TempSrc: Oral Oral Oral Oral  SpO2: 97% 93% 91% 97%  Weight:   46.7 kg (103 lb)   Height:        Intake/Output Summary (Last 24 hours) at 05/14/2017 1518 Last data filed at 05/14/2017 1300 Gross per 24 hour  Intake 960 ml  Output 700 ml  Net 260 ml   Filed Weights   05/12/17 0414 05/13/17 0423 05/14/17 0354  Weight: 46.3 kg (102 lb 1.2 oz) 47 kg (103 lb 11.2 oz) 46.7 kg (103 lb)    Examination:  General exam: Appears calm and comfortable  Respiratory system: Clear to auscultation. Respiratory effort normal. Cardiovascular system: S1 & S2. Gastrointestinal system: Abdomen is nondistended, soft and nontender. No organomegaly or masses  felt. Normal bowel sounds heard. Central nervous system: Alert and oriented. No focal neurological deficits. Extremities: No leg edema Skin: No rashes, lesions or ulcers Psychiatry: Judgement and insight appear normal. Mood & affect appropriate.     Data Reviewed: I have personally reviewed following labs and imaging studies  CBC: Recent Labs  Lab 05/11/17 1000 05/12/17 0348 05/13/17 0119 05/14/17 0513  WBC 8.3 6.7 5.8 5.6  HGB 14.1 13.2 12.9 13.2  HCT 43.0 39.3 37.2 39.0  MCV 87.9 86.0 86.5 85.7  PLT 156 151 143* 606*   Basic Metabolic Panel: Recent Labs  Lab 05/11/17 1000 05/13/17 0119  NA 141 139  K 3.9 4.3  CL 104 108  CO2 27 25  GLUCOSE 101* 100*  BUN 18 14  CREATININE 1.20* 1.01*  CALCIUM 10.1 9.0   GFR: Estimated Creatinine Clearance: 30 mL/min (A) (by C-G formula based on SCr of 1.01 mg/dL (H)). Liver Function Tests: No results for input(s): AST, ALT, ALKPHOS, BILITOT, PROT, ALBUMIN in the last 168 hours. No results for input(s): LIPASE, AMYLASE in the last 168 hours. No results for input(s): AMMONIA in the last 168 hours. Coagulation Profile: No results for input(s): INR, PROTIME in the last 168 hours. Cardiac Enzymes: No results for input(s): CKTOTAL, CKMB, CKMBINDEX, TROPONINI in the last 168 hours. BNP (last 3 results) No results for input(s): PROBNP in the last 8760 hours. HbA1C: No results for input(s): HGBA1C in the  last 72 hours. CBG: Recent Labs  Lab 05/13/17 0745  GLUCAP 88   Lipid Profile: No results for input(s): CHOL, HDL, LDLCALC, TRIG, CHOLHDL, LDLDIRECT in the last 72 hours. Thyroid Function Tests: No results for input(s): TSH, T4TOTAL, FREET4, T3FREE, THYROIDAB in the last 72 hours. Anemia Panel: No results for input(s): VITAMINB12, FOLATE, FERRITIN, TIBC, IRON, RETICCTPCT in the last 72 hours. Urine analysis:    Component Value Date/Time   COLORURINE YELLOW 05/11/2017 1112   APPEARANCEUR CLEAR 05/11/2017 1112   LABSPEC  1.027 05/11/2017 1112   PHURINE 7.0 05/11/2017 [place] 05/11/2017 1112   HGBUR NEGATIVE 05/11/2017 1112   BILIRUBINUR NEGATIVE 05/11/2017 1112   KETONESUR NEGATIVE 05/11/2017 1112   PROTEINUR NEGATIVE 05/11/2017 1112   UROBILINOGEN 0.2 12/02/2014 1543   NITRITE NEGATIVE 05/11/2017 1112   LEUKOCYTESUR SMALL (A) 05/11/2017 1112   Sepsis Labs: @LABRCNTIP (procalcitonin:4,lacticidven:4)  ) Recent Results (from the past 240 hour(s))  MRSA PCR Screening     Status: None   Collection Time: 05/11/17 10:45 PM  Result Value Ref Range Status   MRSA by PCR NEGATIVE NEGATIVE Final    Comment:        The GeneXpert MRSA Assay (FDA approved for NASAL specimens only), is one component of a comprehensive MRSA colonization surveillance program. It is not intended to diagnose MRSA infection nor to guide or monitor treatment for MRSA infections.          Radiology Studies: Ct Venogram Abd/pel  Result Date: 05/13/2017 CLINICAL DATA:  Pulmonary embolism. EXAM: CT VENOGRAM ABD-PELVIS TECHNIQUE: Multidetector CT imaging of the abdomen and pelvis was performed using the standard protocol during bolus administration of intravenous contrast. Multiplanar reconstructed images and MIPs were obtained and reviewed to evaluate the vascular anatomy. CONTRAST:  42mL ISOVUE-300 IOPAMIDOL (ISOVUE-300) INJECTION 61% COMPARISON:  CTA of the chest on 05/11/2017, CT of the abdomen and pelvis on 12/18/2011 FINDINGS: VASCULAR The study was performed as a CT venogram. The IVC and iliac vein show normal patency without evidence of thrombus. There is evidence of nonocclusive thrombus within the lower segment of the right common femoral vein likely extending into the orifices of the profunda femoral and femoral veins. The abdominal aorta is very tortuous. No evidence of abdominal aortic aneurysm. Review of the MIP images confirms the above findings. NON-VASCULAR Lower chest: Atelectasis present at both lung  bases. Cystic structure at the diaphragmatic hiatus just to the right of the distal esophagus has a stable appearance compared to a prior study in 2013 and measures approximately 2.6 x 1.6 cm. 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 Hepatobiliary: No focal liver abnormality is seen. No gallstones, gallbladder wall thickening, or biliary dilatation. Pancreas: Unremarkable. No pancreatic ductal dilatation or surrounding inflammatory changes. Spleen: Normal in size without focal abnormality. Adrenals/Urinary Tract: Adrenal glands are unremarkable. No hydronephrosis or solid renal masses. An upper pole right renal cyst has internal density consistent with a simple cyst. This has enlarged since 2013 and measures 2.3 cm in diameter compared to approximately 1 cm previously. Bladder is unremarkable. Stomach/Bowel: Bowel shows no evidence of obstruction, ileus or inflammation. No bowel lesions identified. Lymphatic: No enlarged lymph nodes identified in the abdomen or pelvis. Reproductive: Uterus and bilateral adnexa are unremarkable. Other: No abdominal wall hernia or abnormality. No abdominopelvic ascites. Musculoskeletal: Degenerative disc disease present of the lumbar spine. IMPRESSION: VASCULAR Nonocclusive DVT in the inferior aspect of the right common femoral vein likely extending into the right femoral and profunda  femoral veins. No evidence of iliac vein or IVC thrombus. NON-VASCULAR 1. 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 2. Enlargement of upper pole renal cyst on the right since 2013. This has the appearance of a simple cyst. Electronically Signed   By: Aletta Edouard M.D.   On: 05/13/2017 15:11        Scheduled Meds: . apixaban  10 mg Oral BID  . loratadine  10 mg Oral Daily  . olopatadine  1 drop Both Eyes BID   Continuous Infusions:    LOS: 2 days    Time spent: 50  Minutes    Dana Allan, MD  [facility] Pager #: [phone] 7PM-7AM contact night coverage as above

## 2017-05-15 DIAGNOSIS — I824Y1 Acute embolism and thrombosis of unspecified deep veins of right proximal lower extremity: Secondary | ICD-10-CM

## 2017-05-15 DIAGNOSIS — I2699 Other pulmonary embolism without acute cor pulmonale: Secondary | ICD-10-CM

## 2017-05-15 LAB — CBC
HEMATOCRIT: 38.5 % (ref 36.0–46.0)
Hemoglobin: 12.9 g/dL (ref 12.0–15.0)
MCH: 28.8 pg (ref 26.0–34.0)
MCHC: 33.5 g/dL (ref 30.0–36.0)
MCV: 85.9 fL (ref 78.0–100.0)
Platelets: 148 10*3/uL — ABNORMAL LOW (ref 150–400)
RBC: 4.48 MIL/uL (ref 3.87–5.11)
RDW: 14.9 % (ref 11.5–15.5)
WBC: 6.2 10*3/uL (ref 4.0–10.5)

## 2017-05-15 MED ORDER — APIXABAN 5 MG PO TABS: 5.0000 mg | ORAL_TABLET | Freq: Two times a day (BID) | ORAL | Status: DC

## 2017-05-15 MED ORDER — APIXABAN 5 MG PO TABS: ORAL_TABLET | ORAL | 1 refills | Status: DC

## 2017-05-15 NOTE — Care Management Note (Addendum)
Case Management Note  Patient Details  Name: Becky Leach MRN: [id number] Date of Birth: Sep 20, 1929  Subjective/Objective:   PE, HTN, CKD                 Action/Plan: Discharge Planning: NCM spoke to pt and offered choice for HH/list provided. Pt states she had [facility] in the past. [facility] with new referral. Pt has RW, bedside commode and shower stool at home. Lives in the home with adult son. Provided pt with Eliquis 30 day free trial card. Her pharmacy has in stock. Her copay is $3.70.   PCP Guadalupe Dawn MD  Expected Discharge Date:  05/15/17               Expected Discharge Plan:  [place]  In-House Referral:  NA  Discharge planning Services  CM Consult  Post Acute Care Choice:  Home Health Choice offered to:  Patient  DME Arranged:  N/A DME Agency:  NA  HH Arranged:  PT HH Agency:  [facility]  Status of Service:  Completed, signed off  If discussed at [place] of Stay Meetings, dates discussed:    Additional Comments:  Erenest Rasher, RN 05/15/2017, 3:14 PM

## 2017-05-15 NOTE — Discharge Summary (Addendum)
Physician Discharge Summary  Becky Leach KGM:[id number] DOB: 1929-12-28  PCP: Guadalupe Dawn, MD  Admit date: 05/11/2017 Discharge date: 05/15/2017  Recommendations for Outpatient Follow-up:  1. Dr. Guadalupe Dawn, PCP in one week with repeat labs (CBC & BMP).  Home Health: PT Equipment/Devices: None    Discharge Condition: Improved and stable  CODE STATUS: Full  Diet recommendation: Heart healthy diet.  Discharge Diagnoses:  Active Problems:   Pulmonary embolism [facility])   Brief Summary: 83 year old female, lives with her son who is available 24/7, has both a cane and walker at home, who awakened 12/13 with dyspnea and sweats but she did not have any cough or chest pain. She had dizziness. In the ED she was noted to be tachycardic. A CTA chest showed pulmonary embolism with an enlarged RV with an RV to LV ratio of 1.6-1. She denied any provocation for pulmonary embolism. She has never suffered from DVT or pulmonary embolism before. Although she has osteoarthritis, she is still able to ambulate and is not bedbound. She denied recent prolonged travel, has had no recent surgeries other than an injection of her right knee. She does not smoke, not obese and has no known history of cancer. She did have peptic ulcer disease in 2013 which has not recurred. She has never had any unusual bleeding. She has not had any recent surgery on her eyes, back or brain. She was admitted to ICU by CCM service. Her care was transferred to [facility] 05/13/17.  Assessment and plan:  1. Acute submassive pulmonary embolism/acute right lower extremity DVT (femoral vein and common femoral vein): PCCM admitted to ICU and determined that she had an unprovoked submassive pulmonary embolism with RV/LV ratio of 1.6 and high PA pressure noted on echo. Her only risk factor is decreased mobility due to arthritis. Lower extremity venous Doppler confirmed right femoral DVT. 2-D echo interestingly pointed out IVC thrombus but CT  venogram of abdomen and pelvis showed no evidence of iliac vein or IVC thrombus. She was placed on IV heparin from date of admission 05/11/17 until 05/14/17 when she was transitioned to Eliquis after discussing the risks and benefits of available oral anticoagulation options. As per pulmonology, she will need anticoagulation for at least 6 months and perhaps longer. 2. Essential hypertension: Not on antihypertensives at home. Blood pressures mildly uncontrolled at times. Close outpatient follow-up to determine resumption of oral antihypertensives. 3. Osteoarthritis: Continue home pain medication regimen. 4. Mild thrombocytopenia: May have been related to IV heparin infusion. Improved and platelet count close to normal. Follow CBC in a week's time as outpatient. 5. Stage III 3 chronic kidney disease: Creatinine normal. Follow periodically. 6. Right upper pole renal cyst: Seen on CT venogram of abdomen and pelvis. As per radiology, appearance of a simple cyst. Outpatient follow-up as deemed necessary.   Consultations:  PCCM  Procedures:  2-D echo 05/11/17: Study Conclusions  - Left ventricle: The cavity size was normal. Systolic function was   normal. The estimated ejection fraction was in the range of 60%   to 65%. Wall motion was normal; there were no regional wall   motion abnormalities. The study is not technically sufficient to   allow evaluation of LV diastolic function. Doppler parameters are   consistent with high ventricular filling pressure. - Aortic valve: There was trivial regurgitation. - Right ventricle: The cavity size was mildly dilated. Wall   thickness was normal. - Right atrium: The atrium was moderately dilated. - Tricuspid valve: There was moderate regurgitation. -  Pulmonary arteries: PA peak pressure: 52 mm Hg (S). - Systemic veins: The IVC appears dilated although no measurements   were made. Suggestive of density filling the IVC which, in the   setting of PE,  could be a large thrombus but can also be seen in   renal cell CA. Recommended abdominal and chest CT for further   evaluationl.  Impressions:  - The right ventricular systolic pressure was increased consistent   with moderate pulmonary hypertension.   Bilateral lower extremity venous Dopplers: Final Interpretation Right: No cystic structure found in the popliteal fossa. Deep vein thrombosis noted in the femoral vein and common femoral vein, not up to the level of saphenofemoral junction. Left: There is no evidence of deep vein thrombosis in the lower extremity. No cystic structure found in the popliteal fossa.  Discharge Instructions  Discharge Instructions    Call MD for:  difficulty breathing, headache or visual disturbances   Complete by:  As directed    Call MD for:  extreme fatigue   Complete by:  As directed    Call MD for:  persistant dizziness or light-headedness   Complete by:  As directed    Call MD for:  severe uncontrolled pain   Complete by:  As directed    Diet - low sodium heart healthy   Complete by:  As directed    Increase activity slowly   Complete by:  As directed        Medication List    STOP taking these medications   amLODipine 5 MG tablet Commonly known as:  NORVASC   fluticasone 50 MCG/ACT nasal spray Commonly known as:  FLONASE   olopatadine 0.1 % ophthalmic solution Commonly known as:  PATANOL     TAKE these medications   acetaminophen 325 MG tablet Commonly known as:  TYLENOL Take 2 tablets (650 mg total) by mouth every 6 (six) hours as needed for pain.   apixaban 5 MG Tabs tablet Commonly known as:  ELIQUIS Take 2 tablets (10mg  total) twice daily for 11 doses followed by 1 tablet (5mg  total) twice daily.   diclofenac sodium 1 % Gel Commonly known as:  VOLTAREN Apply 2 g topically 4 (four) times daily as needed.   loratadine 10 MG tablet Commonly known as:  CLARITIN Take 1 tablet (10 mg total) by mouth daily.    multivitamin-iron-minerals-folic acid chewable tablet Chew 1 tablet by mouth daily.   traMADol 50 MG tablet Commonly known as:  ULTRAM Take 0.5-1 tablet (25-50mg ) ONCE daily as needed for pain.      Follow-up Information    Guadalupe Dawn, MD. Schedule an appointment as soon as possible for a visit in 1 week(s).   Specialty:  Family Medicine Why:  To be seen with repeat labs (CBC + BMP). Contact information: [address] [address] [phone]          Allergies  Allergen Reactions  . Penicillins Rash      Procedures/Studies: Ct Angio Chest Pe W And/or Wo Contrast  Result Date: 05/11/2017 CLINICAL DATA:  Elevated D-dimer. Onset of tachycardia and dizziness this morning. EXAM: CT ANGIOGRAPHY CHEST WITH CONTRAST TECHNIQUE: Multidetector CT imaging of the chest was performed using the standard protocol during bolus administration of intravenous contrast. Multiplanar CT image reconstructions and MIPs were obtained to evaluate the vascular anatomy. CONTRAST:  80 ISOVUE-370 IOPAMIDOL (ISOVUE-370) INJECTION 76% COMPARISON:  CT chest 12/08/2011. Single view of the chest 05/11/2017. FINDINGS: Cardiovascular: Extensive bilateral pulmonary  emboli are identified including clot in the descending interlobar pulmonary arteries bilaterally and in the right main pulmonary artery. The RV to LV ratio is 1.6 consistent with right heart strain. Heart size is enlarged. No pericardial effusion. Aortic atherosclerosis is noted. Mediastinum/Nodes: No lymphadenopathy. Small hiatal hernia is noted. Thyroid gland appears normal. Lungs/Pleura: No pleural effusion. Small foci of linear atelectasis or scar in the lung bases are noted. No evidence of pulmonary infarct. No nodule or mass. Upper Abdomen: Cyst in the upper pole the right kidney is partially visualized. Musculoskeletal: No fracture or lytic or sclerotic lesion. Review of the MIP images confirms the above findings. IMPRESSION:  Positive for acute PE with CT evidence of right heart strain (RV/LV Ratio = 1.6) consistent with at least submassive (intermediate risk) PE. The presence of right heart strain has been associated with an increased risk of morbidity and mortality. Please activate Code PE by paging [phone]. Aortic Atherosclerosis (ICD10-I70.0). Electronically Signed   By: Inge Rise M.D.   On: 05/11/2017 12:43   Dg Chest Portable 1 View  Result Date: 05/11/2017 CLINICAL DATA:  Onset of chest pain and tachycardia this morning. No shortness of breath. History of hypertension, nonsmoker. EXAM: PORTABLE CHEST 1 VIEW COMPARISON:  Chest x-ray of December 02, 2014 and February 27, 2012. FINDINGS: The lungs remain hyperinflated. There is no focal infiltrate. There is no pleural effusion or pneumothorax. The cardiac silhouette remains enlarged. The pulmonary vascularity is normal. There is calcification in the wall of the aortic arch. There is prominent thoracic kyphosis with multilevel degenerative disc disease. IMPRESSION: Chronic bronchitic changes, stable. Mild cardiomegaly without pulmonary vascular congestion or pulmonary edema. Thoracic aortic atherosclerosis. Electronically Signed   By: David  Martinique M.D.   On: 05/11/2017 11:13   Ct Venogram Abd/pel  Result Date: 05/13/2017 CLINICAL DATA:  Pulmonary embolism. EXAM: CT VENOGRAM ABD-PELVIS TECHNIQUE: Multidetector CT imaging of the abdomen and pelvis was performed using the standard protocol during bolus administration of intravenous contrast. Multiplanar reconstructed images and MIPs were obtained and reviewed to evaluate the vascular anatomy. CONTRAST:  82mL ISOVUE-300 IOPAMIDOL (ISOVUE-300) INJECTION 61% COMPARISON:  CTA of the chest on 05/11/2017, CT of the abdomen and pelvis on 12/18/2011 FINDINGS: VASCULAR The study was performed as a CT venogram. The IVC and iliac vein show normal patency without evidence of thrombus. There is evidence of nonocclusive thrombus  within the lower segment of the right common femoral vein likely extending into the orifices of the profunda femoral and femoral veins. The abdominal aorta is very tortuous. No evidence of abdominal aortic aneurysm. Review of the MIP images confirms the above findings. NON-VASCULAR Lower chest: Atelectasis present at both lung bases. Cystic structure at the diaphragmatic hiatus just to the right of the distal esophagus has a stable appearance compared to a prior study in 2013 and measures approximately 2.6 x 1.6 cm. 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 Hepatobiliary: No focal liver abnormality is seen. No gallstones, gallbladder wall thickening, or biliary dilatation. Pancreas: Unremarkable. No pancreatic ductal dilatation or surrounding inflammatory changes. Spleen: Normal in size without focal abnormality. Adrenals/Urinary Tract: Adrenal glands are unremarkable. No hydronephrosis or solid renal masses. An upper pole right renal cyst has internal density consistent with a simple cyst. This has enlarged since 2013 and measures 2.3 cm in diameter compared to approximately 1 cm previously. Bladder is unremarkable. Stomach/Bowel: Bowel shows no evidence of obstruction, ileus or inflammation. No bowel lesions identified. Lymphatic: No enlarged lymph nodes identified in the  abdomen or pelvis. Reproductive: Uterus and bilateral adnexa are unremarkable. Other: No abdominal wall hernia or abnormality. No abdominopelvic ascites. Musculoskeletal: Degenerative disc disease present of the lumbar spine. IMPRESSION: VASCULAR Nonocclusive DVT in the inferior aspect of the right common femoral vein likely extending into the right femoral and profunda femoral veins. No evidence of iliac vein or IVC thrombus. NON-VASCULAR 1. 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 2. Enlargement of upper pole renal cyst on the right  since 2013. This has the appearance of a simple cyst. Electronically Signed   By: Aletta Edouard M.D.   On: 05/13/2017 15:11      Subjective: No complaints reported other than usual chronic arthritic joint pains of her legs. No chest pain, dyspnea, palpitations, dizziness or lightheadedness reported. She denies history of falls. She indicates that she lives with her adult son who is available 24/7 to assist her at home.  Discharge Exam:  Vitals:   05/14/17 1927 05/15/17 0013 05/15/17 0356 05/15/17 0924  BP: (!) 139/103 (!) 130/95 (!) 141/92 121/80  Pulse: 87 75 71 (!) 106  Resp: 16 (!) 22 (!) 23 (!) 21  Temp: 98.5 F (36.9 C) 97.6 F (36.4 C) 98 F (36.7 C) (!) 97.4 F (36.3 C)  TempSrc: Oral Axillary Oral Oral  SpO2: 94% 96% 97% 96%  Weight:   45.8 kg (100 lb 14.4 oz)   Height:        General: Pleasant elderly female, small built and thinly nourished lying comfortably propped up in bed. Cardiovascular: S1 & S2 heard, RRR, S1/S2 +. No murmurs, rubs, gallops or clicks. No JVD or pedal edema. Telemetry: Sinus rhythm. Respiratory: Clear to auscultation without wheezing, rhonchi or crackles. No increased work of breathing. Abdominal:  Non distended, non tender & soft. No organomegaly or masses appreciated. Normal bowel sounds heard. CNS: Alert and oriented. No focal deficits. Extremities: no edema, no cyanosis. No asymmetric lower extremity swelling noted. No acute findings. Good symmetric peripheral pulses felt.    The results of significant diagnostics from this hospitalization (including imaging, microbiology, ancillary and laboratory) are listed below for reference.     Microbiology: Recent Results (from the past 240 hour(s))  MRSA PCR Screening     Status: None   Collection Time: 05/11/17 10:45 PM  Result Value Ref Range Status   MRSA by PCR NEGATIVE NEGATIVE Final    Comment:        The GeneXpert MRSA Assay (FDA approved for NASAL specimens only), is one component of  a comprehensive MRSA colonization surveillance program. It is not intended to diagnose MRSA infection nor to guide or monitor treatment for MRSA infections.      Labs: CBC: Recent Labs  Lab 05/11/17 1000 05/12/17 0348 05/13/17 0119 05/14/17 0513 05/15/17 0351  WBC 8.3 6.7 5.8 5.6 6.2  HGB 14.1 13.2 12.9 13.2 12.9  HCT 43.0 39.3 37.2 39.0 38.5  MCV 87.9 86.0 86.5 85.7 85.9  PLT 156 151 143* 142* 267*   Basic Metabolic Panel: Recent Labs  Lab 05/11/17 1000 05/13/17 0119  NA 141 139  K 3.9 4.3  CL 104 108  CO2 27 25  GLUCOSE 101* 100*  BUN 18 14  CREATININE 1.20* 1.01*  CALCIUM 10.1 9.0    CBG: Recent Labs  Lab 05/13/17 0745  GLUCAP 88   Urinalysis    Component Value Date/Time   COLORURINE YELLOW 05/11/2017 1112   APPEARANCEUR  CLEAR 05/11/2017 1112   LABSPEC 1.027 05/11/2017 1112   PHURINE 7.0 05/11/2017 1112   GLUCOSEU NEGATIVE 05/11/2017 1112   HGBUR NEGATIVE 05/11/2017 1112   BILIRUBINUR NEGATIVE 05/11/2017 1112   KETONESUR NEGATIVE 05/11/2017 1112   PROTEINUR NEGATIVE 05/11/2017 1112   UROBILINOGEN 0.2 12/02/2014 1543   NITRITE NEGATIVE 05/11/2017 1112   LEUKOCYTESUR SMALL (A) 05/11/2017 1112      Time coordinating discharge: Less than 30 minutes  SIGNED:  Vernell Leep, MD, FACP, [facility]. [facility] Pager [phone]  If 7PM-7AM, please contact night-coverage [email or web address] Password [email or web address] 05/15/2017, 2:07 PM

## 2017-05-15 NOTE — Evaluation (Signed)
Physical Therapy Evaluation Patient Details Name: Becky Leach MRN: [id number] DOB: 1930/03/10 Today's Date: 05/15/2017   History of Present Illness  83 yo female who woke up with dyspnea and sweating; she presented to the hospital and was found to have significant PE and R heart strain, imaging also later found R LE DVT. Patient was started on heparin but transitioned to Eliquis during her stay.   Clinical Impression  Patient received sitting upright in chair, pleasant and willing to participate in skilled PT services today. Note generalized weakness per MMT, as well as general unsteadiness per short forwards and retro gait in room with HHA; patient reports no falls within the past 3 months and confirms that she does regularly use an assistive device at home. Ambulated approximately 15f within room, however note HR increase to 120BPM with short gait distance, recovered as appropriate with rest. Patient agreeable to HHPT at this time; RN educated regarding patient performance with PT/PT recommendations as well as removal of PureWick for PT evaluation today. Patient left up in chair with all questions/concerns addressed and all needs met.     Follow Up Recommendations Home health PT    Equipment Recommendations  None recommended by PT(patient appears to have necessary DME at home )    Recommendations for Other Services       Precautions / Restrictions Precautions Precautions: Fall Restrictions Weight Bearing Restrictions: No      Mobility  Bed Mobility               General bed mobility comments: DNT, up in chair   Transfers Overall transfer level: Needs assistance Equipment used: Rolling walker (2 wheeled);None Transfers: Sit to/from Stand Sit to Stand: Min guard         General transfer comment: min guard for sit to stand with and without rolling walker, cues for hand placement   Ambulation/Gait Ambulation/Gait assistance: Min guard Ambulation Distance (Feet): 20  Feet(in room ) Assistive device: Rolling walker (2 wheeled) Gait Pattern/deviations: Step-through pattern;Decreased step length - right;Decreased step length - left;Decreased dorsiflexion - right;Decreased dorsiflexion - left;Narrow base of support;Trunk flexed     General Gait Details: generalized unsteadiness noted, hip extensor weakness noted with retrogait   Stairs            Wheelchair Mobility    Modified Rankin (Stroke Patients Only)       Balance Overall balance assessment: Needs assistance Sitting-balance support: Bilateral upper extremity supported;Feet supported Sitting balance-Leahy Scale: Good     Standing balance support: No upper extremity supported Standing balance-Leahy Scale: Fair                               Pertinent Vitals/Pain Pain Assessment: No/denies pain    Home Living Family/patient expects to be discharged to:: Private residence Living Arrangements: Children Available Help at Discharge: Family Type of Home: House Home Access: Stairs to enter Entrance Stairs-Rails: Right;Left;Can reach both Entrance Stairs-Number of Steps: 6" coming from street, then 3 steps with 2 railings to use  Home Layout: One level Home Equipment: Cane - single point;Bedside commode;Walker - 2 wheels      Prior Function Level of Independence: Independent with assistive device(s)               Hand Dominance        Extremity/Trunk Assessment   Upper Extremity Assessment Upper Extremity Assessment: Generalized weakness    Lower Extremity Assessment Lower Extremity  Assessment: Generalized weakness    Cervical / Trunk Assessment Cervical / Trunk Assessment: Kyphotic  Communication   Communication: No difficulties  Cognition Arousal/Alertness: Awake/alert Behavior During Therapy: WFL for tasks assessed/performed Overall Cognitive Status: Within Functional Limits for tasks assessed                                         General Comments      Exercises     Assessment/Plan    PT Assessment Patient needs continued PT services  PT Problem List Decreased strength;Decreased mobility;Decreased balance;Decreased activity tolerance       PT Treatment Interventions DME instruction;Therapeutic activities;Gait training;Therapeutic exercise;Patient/family education;Stair training;Balance training;Functional mobility training;Neuromuscular re-education    PT Goals (Current goals can be found in the Care Plan section)  Acute Rehab PT Goals Patient Stated Goal: to go home  PT Goal Formulation: With patient Time For Goal Achievement: 05/22/17 Potential to Achieve Goals: Good    Frequency Min 3X/week   Barriers to discharge        Co-evaluation               AM-PAC PT "6 Clicks" Daily Activity  Outcome Measure Difficulty turning over in bed (including adjusting bedclothes, sheets and blankets)?: Unable Difficulty moving from lying on back to sitting on the side of the bed? : Unable Difficulty sitting down on and standing up from a chair with arms (e.g., wheelchair, bedside commode, etc,.)?: Unable Help needed moving to and from a bed to chair (including a wheelchair)?: A Little Help needed walking in hospital room?: A Little Help needed climbing 3-5 steps with a railing? : A Little 6 Click Score: 12    End of Session   Activity Tolerance: Patient tolerated treatment well Patient left: in chair;with call bell/phone within reach Nurse Communication: Other (comment);Mobility status(patient performance with PT, PureWick catheter was taken off for PT ) PT Visit Diagnosis: Unsteadiness on feet (R26.81);Muscle weakness (generalized) (M62.81)    Time: 2595-6387 PT Time Calculation (min) (ACUTE ONLY): 17 min   Charges:   PT Evaluation $PT Eval Low Complexity: 1 Low     PT G Codes:   PT G-Codes **NOT FOR INPATIENT CLASS** Functional Assessment Tool Used: AM-PAC 6 Clicks Basic  Mobility;Clinical judgement Functional Limitation: Mobility: Walking and moving around Mobility: Walking and Moving Around Current Status (F6433): At least 20 percent but less than 40 percent impaired, limited or restricted Mobility: Walking and Moving Around Goal Status [phone]): At least 1 percent but less than 20 percent impaired, limited or restricted    Deniece Ree PT, DPT, CBIS  Supplemental Physical Therapist [facility]

## 2017-05-15 NOTE — Care Management Important Message (Signed)
Important Message  Patient Details  Name: Becky Leach MRN: [id number] Date of Birth: 26-Oct-1929   Medicare Important Message Given:  Yes    Erenest Rasher, RN 05/15/2017, 3:12 PM

## 2017-05-17 DIAGNOSIS — Z7901 Long term (current) use of anticoagulants: Secondary | ICD-10-CM | POA: Diagnosis not present

## 2017-05-17 DIAGNOSIS — I129 Hypertensive chronic kidney disease with stage 1 through stage 4 chronic kidney disease, or unspecified chronic kidney disease: Secondary | ICD-10-CM | POA: Diagnosis not present

## 2017-05-17 DIAGNOSIS — Z87440 Personal history of urinary (tract) infections: Secondary | ICD-10-CM | POA: Diagnosis not present

## 2017-05-17 DIAGNOSIS — M1990 Unspecified osteoarthritis, unspecified site: Secondary | ICD-10-CM | POA: Diagnosis not present

## 2017-05-17 DIAGNOSIS — N183 Chronic kidney disease, stage 3 (moderate): Secondary | ICD-10-CM | POA: Diagnosis not present

## 2017-05-17 DIAGNOSIS — M4800 Spinal stenosis, site unspecified: Secondary | ICD-10-CM | POA: Diagnosis not present

## 2017-05-17 DIAGNOSIS — D696 Thrombocytopenia, unspecified: Secondary | ICD-10-CM | POA: Diagnosis not present

## 2017-05-17 DIAGNOSIS — I2699 Other pulmonary embolism without acute cor pulmonale: Secondary | ICD-10-CM | POA: Diagnosis not present

## 2017-05-17 DIAGNOSIS — I82411 Acute embolism and thrombosis of right femoral vein: Secondary | ICD-10-CM | POA: Diagnosis not present

## 2017-05-18 ENCOUNTER — Other Ambulatory Visit: Payer: Self-pay | Admitting: Family Medicine

## 2017-05-18 ENCOUNTER — Telehealth: Payer: Self-pay | Admitting: *Deleted

## 2017-05-18 DIAGNOSIS — M79605 Pain in left leg: Secondary | ICD-10-CM

## 2017-05-18 MED ORDER — TRAMADOL HCL 50 MG PO TABS: ORAL_TABLET | ORAL | 1 refills | Status: DC

## 2017-05-18 NOTE — Telephone Encounter (Signed)
Please call him and let him know this is ready.  Guadalupe Dawn MD PGY-1 Family Medicine Resident

## 2017-05-18 NOTE — Telephone Encounter (Signed)
Received message on nurse line from [place], [place] with [facility] requesting VO for [facility] PT 2 x per week for 1 week, 1 x per week for 2 weeks, 2 x per week for 2 weeks and 1 x per week every other week for 4 weeks. May leave message on VM [phone]. Hubbard Hartshorn, RN, BSN

## 2017-05-18 NOTE — Telephone Encounter (Signed)
The patient's son sent multiple inappropriate refill requests for tramadol back in October. I explained to him at the time that his mother was given a prescription for a 3 month supply at a visit in october and that I will not give a refill until she was due in December. Furthermore I have not received ANY faxed documents from his pharmacy within the last month. I will gladly fill out a refill prescription today as his mother is now due, but his anger is misplaced as this has been explained to him multiple times. I will give a printed refill prescription of tramadol to the red team nurse for pickup today.  Guadalupe Dawn MD PGY-1 Family Medicine Resident

## 2017-05-18 NOTE — Telephone Encounter (Signed)
Son is calling because the pharmacy has faxed several times a refill request on his mother's Tramadol. He is starting ti get a little irritated because every time they need a prescription or anything it takes several days and him calling to complain a few times before this is taking care of. He would like to pick this up today. He said that if this is not taken care of today he will be calling someone a lot higher, it said that this is ridiculous that a person has to beg and plead for their medications to be sent in a timely matter. jw

## 2017-05-18 NOTE — Telephone Encounter (Signed)
Gave verbal ok for this treatment. Talked to [place]. They will fax over form to be filled out. Will fill out form when it arrives.  Guadalupe Dawn MD PGY-1 Family Medicine Resident

## 2017-05-18 NOTE — Telephone Encounter (Signed)
Pt son informed. Got angry when I told him the rx was upfront. Will call it in to the pharmacy. Lenis Nettleton Kennon Holter, CMA

## 2017-05-19 DIAGNOSIS — I2699 Other pulmonary embolism without acute cor pulmonale: Secondary | ICD-10-CM | POA: Diagnosis not present

## 2017-05-19 DIAGNOSIS — N183 Chronic kidney disease, stage 3 (moderate): Secondary | ICD-10-CM | POA: Diagnosis not present

## 2017-05-19 DIAGNOSIS — M4800 Spinal stenosis, site unspecified: Secondary | ICD-10-CM | POA: Diagnosis not present

## 2017-05-19 DIAGNOSIS — D696 Thrombocytopenia, unspecified: Secondary | ICD-10-CM | POA: Diagnosis not present

## 2017-05-19 DIAGNOSIS — I82411 Acute embolism and thrombosis of right femoral vein: Secondary | ICD-10-CM | POA: Diagnosis not present

## 2017-05-19 DIAGNOSIS — I129 Hypertensive chronic kidney disease with stage 1 through stage 4 chronic kidney disease, or unspecified chronic kidney disease: Secondary | ICD-10-CM | POA: Diagnosis not present

## 2017-05-25 ENCOUNTER — Other Ambulatory Visit: Payer: Self-pay

## 2017-05-25 ENCOUNTER — Encounter: Payer: Self-pay | Admitting: Family Medicine

## 2017-05-25 ENCOUNTER — Ambulatory Visit (INDEPENDENT_AMBULATORY_CARE_PROVIDER_SITE_OTHER): Payer: Medicare Other | Admitting: Family Medicine

## 2017-05-25 VITALS — BP 132/82 | HR 92 | Temp 98.3°F | Ht 62.0 in | Wt 104.2 lb

## 2017-05-25 DIAGNOSIS — N183 Chronic kidney disease, stage 3 (moderate): Secondary | ICD-10-CM

## 2017-05-25 DIAGNOSIS — Z86718 Personal history of other venous thrombosis and embolism: Secondary | ICD-10-CM

## 2017-05-25 DIAGNOSIS — I2699 Other pulmonary embolism without acute cor pulmonale: Secondary | ICD-10-CM

## 2017-05-25 DIAGNOSIS — I10 Essential (primary) hypertension: Secondary | ICD-10-CM | POA: Diagnosis not present

## 2017-05-25 DIAGNOSIS — D696 Thrombocytopenia, unspecified: Secondary | ICD-10-CM

## 2017-05-25 DIAGNOSIS — I129 Hypertensive chronic kidney disease with stage 1 through stage 4 chronic kidney disease, or unspecified chronic kidney disease: Secondary | ICD-10-CM | POA: Diagnosis not present

## 2017-05-25 MED ORDER — APIXABAN 5 MG PO TABS: ORAL_TABLET | ORAL | 1 refills | Status: DC

## 2017-05-25 NOTE — Patient Instructions (Addendum)
No medications have been changed today.  I have checked your bloodwork and will call you once the results return.  I have sent in referrals to both Cardiology and Pulmonology as requested by your son.    You can expect a call within a week or so regarding appointments for both of these.   Your Eliquis was also refilled and can be picked up from your pharmacy. Call clinic if you have any questions.

## 2017-05-25 NOTE — Assessment & Plan Note (Signed)
Patient recently admitted for dyspnea and found to have submassive PE, unprovoked.  No prior h/o PE or DVT.  Currently on Eliquis and reports good medication compliance.  No current symptoms.   -Continue Eliquis for at least 6 months per Pulmonology -Son requests outpatient follow up with Pulm - referral placed

## 2017-05-25 NOTE — Progress Notes (Signed)
   Subjective:   Patient ID: Becky Leach    DOB: [birth year], 83 y.o. female   MRN: [id number]  CC: hospital follow-up   HPI: Becky Leach is a 83 y.o. female who presents to clinic today for hospital follow-up.  Dyspnea  83 yo female presenting with son.  She was admitted to [facility] on 05/11/2017 with dyspnea and sweats. In ED was found to be tachycardic, CTA revealed unprovoked submassive PE with RV/LV ratio of 1.6 and high PA pressure noted on echo. She has no prior history of PE or DVT.  PCCM admitted and she was placed on IV heparin until 05/14/2017 when she was transitioned to Eliquis.  Per Pulmonology she will need anticoagulation for at least 6 months.  She reports good compliance with her Eliquis and has not missed any doses. She has been feeling well and denies symptoms of chest pain, shortness of breath, palpitations and leg swelling.  Denies fevers, chills, nausea, vomiting, abdominal pain.   ROS: See HPI for pertinent ROS. Social: she is a non-smoker   Medications reviewed.  Objective:   BP 132/82   Pulse 92   Temp 98.3 F (36.8 C) (Oral)   Ht 5\' 2"  (1.575 m)   Wt 104 lb 3.2 oz (47.3 kg)   SpO2 92%   BMI 19.06 kg/m  Vitals and nursing note reviewed.  General: 83 yo female, NAD  HEENT: NCAT, PERRL, EOMI, MMM, o/p clear  Neck: supple, nontender  CV: RRR no MRG  Lungs: CTAB, nonlaboured, normal effort  Abdomen: soft, NTND, +bs  Skin: warm, dry, no rash Extremities: warm and well perfused, no edema   Assessment & Plan:   Benign essential HTN Controlled - 132/82 at today's visit.  No changes made to medication.  -Continue current regimen -Check BMET for SCr   Pulmonary embolism [facility]) Patient recently admitted for dyspnea and found to have submassive PE, unprovoked.  No prior h/o PE or DVT.  Currently on Eliquis and reports good medication compliance.  No current symptoms.   -Continue Eliquis for at least 6 months per Pulmonology -Son requests outpatient follow up  with Pulm - referral placed   Thrombocytopenia ([place]) Stable on admission, however mild drop observed over course of hospitalization.  -Repeat CBC   Orders Placed This Encounter  Procedures  . CBC  . Basic Metabolic Panel  . Ambulatory referral to Pulmonology    Referral Priority:   Routine    Referral Type:   Consultation    Referral Reason:   Specialty Services Required    Requested Specialty:   Pulmonary Disease    Number of Visits Requested:   1  . Ambulatory referral to Cardiology    Referral Priority:   Routine    Referral Type:   Consultation    Referral Reason:   Specialty Services Required    Requested Specialty:   Cardiology    Number of Visits Requested:   1   Meds ordered this encounter  Medications  . apixaban (ELIQUIS) 5 MG TABS tablet    Sig: Take 2 tablets (10mg  total) twice daily for 11 doses followed by 1 tablet (5mg  total) twice daily.    Dispense:  60 tablet    Refill:  1    [name], MD [place], PGY-2 05/25/2017 4:50 PM

## 2017-05-25 NOTE — Assessment & Plan Note (Signed)
Stable on admission, however mild drop observed over course of hospitalization.  -Repeat CBC

## 2017-05-25 NOTE — Assessment & Plan Note (Signed)
Controlled - 132/82 at today's visit.  No changes made to medication.  -Continue current regimen -Check BMET for SCr

## 2017-05-26 DIAGNOSIS — I2699 Other pulmonary embolism without acute cor pulmonale: Secondary | ICD-10-CM | POA: Diagnosis not present

## 2017-05-26 DIAGNOSIS — M4800 Spinal stenosis, site unspecified: Secondary | ICD-10-CM | POA: Diagnosis not present

## 2017-05-26 DIAGNOSIS — I82411 Acute embolism and thrombosis of right femoral vein: Secondary | ICD-10-CM | POA: Diagnosis not present

## 2017-05-26 DIAGNOSIS — D696 Thrombocytopenia, unspecified: Secondary | ICD-10-CM | POA: Diagnosis not present

## 2017-05-26 DIAGNOSIS — I129 Hypertensive chronic kidney disease with stage 1 through stage 4 chronic kidney disease, or unspecified chronic kidney disease: Secondary | ICD-10-CM | POA: Diagnosis not present

## 2017-05-26 DIAGNOSIS — N183 Chronic kidney disease, stage 3 (moderate): Secondary | ICD-10-CM | POA: Diagnosis not present

## 2017-05-26 LAB — BASIC METABOLIC PANEL
BUN / CREAT RATIO: 12 (ref 12–28)
BUN: 13 mg/dL (ref 8–27)
CHLORIDE: 104 mmol/L (ref 96–106)
CO2: 27 mmol/L (ref 20–29)
Calcium: 10 mg/dL (ref 8.7–10.3)
Creatinine, Ser: 1.07 mg/dL — ABNORMAL HIGH (ref 0.57–1.00)
GFR calc non Af Amer: 47 mL/min/{1.73_m2} — ABNORMAL LOW (ref >59)
GFR, EST AFRICAN AMERICAN: 55 mL/min/{1.73_m2} — AB (ref >59)
GLUCOSE: 88 mg/dL (ref 65–99)
POTASSIUM: 4.5 mmol/L (ref 3.5–5.2)
SODIUM: 144 mmol/L (ref 134–144)

## 2017-05-26 LAB — CBC
Hematocrit: 41.3 % (ref 34.0–46.6)
Hemoglobin: 13.4 g/dL (ref 11.1–15.9)
MCH: 28.5 pg (ref 26.6–33.0)
MCHC: 32.4 g/dL (ref 31.5–35.7)
MCV: 88 fL (ref 79–97)
PLATELETS: 283 10*3/uL (ref 150–379)
RBC: 4.7 x10E6/uL (ref 3.77–5.28)
RDW: 15.1 % (ref 12.3–15.4)
WBC: 5.6 10*3/uL (ref 3.4–10.8)

## 2017-06-02 DIAGNOSIS — I2699 Other pulmonary embolism without acute cor pulmonale: Secondary | ICD-10-CM | POA: Diagnosis not present

## 2017-06-02 DIAGNOSIS — D696 Thrombocytopenia, unspecified: Secondary | ICD-10-CM | POA: Diagnosis not present

## 2017-06-02 DIAGNOSIS — I129 Hypertensive chronic kidney disease with stage 1 through stage 4 chronic kidney disease, or unspecified chronic kidney disease: Secondary | ICD-10-CM | POA: Diagnosis not present

## 2017-06-02 DIAGNOSIS — I82411 Acute embolism and thrombosis of right femoral vein: Secondary | ICD-10-CM | POA: Diagnosis not present

## 2017-06-02 DIAGNOSIS — M4800 Spinal stenosis, site unspecified: Secondary | ICD-10-CM | POA: Diagnosis not present

## 2017-06-02 DIAGNOSIS — N183 Chronic kidney disease, stage 3 (moderate): Secondary | ICD-10-CM | POA: Diagnosis not present

## 2017-06-06 DIAGNOSIS — I129 Hypertensive chronic kidney disease with stage 1 through stage 4 chronic kidney disease, or unspecified chronic kidney disease: Secondary | ICD-10-CM | POA: Diagnosis not present

## 2017-06-06 DIAGNOSIS — I2699 Other pulmonary embolism without acute cor pulmonale: Secondary | ICD-10-CM | POA: Diagnosis not present

## 2017-06-06 DIAGNOSIS — I82411 Acute embolism and thrombosis of right femoral vein: Secondary | ICD-10-CM | POA: Diagnosis not present

## 2017-06-06 DIAGNOSIS — N183 Chronic kidney disease, stage 3 (moderate): Secondary | ICD-10-CM | POA: Diagnosis not present

## 2017-06-06 DIAGNOSIS — M4800 Spinal stenosis, site unspecified: Secondary | ICD-10-CM | POA: Diagnosis not present

## 2017-06-06 DIAGNOSIS — D696 Thrombocytopenia, unspecified: Secondary | ICD-10-CM | POA: Diagnosis not present

## 2017-06-07 ENCOUNTER — Telehealth: Payer: Self-pay | Admitting: Family Medicine

## 2017-06-07 NOTE — Telephone Encounter (Signed)
Pt wants to know her test results from her blood work from her appointment on the 27th

## 2017-06-07 NOTE — Telephone Encounter (Signed)
It appears that she saw Dr. Reesa Chew for this visit. As I didn't actually see this patient at this visit (and havent since September) I am not sure it is appropriate for me to call with these results. Have routed this conversation to Dr. Reesa Chew.  Guadalupe Dawn MD PGY-1 Family Medicine Resident

## 2017-06-08 NOTE — Telephone Encounter (Signed)
Called patient and relayed information to her son that kidney function is at baseline and the rest of her labs look good.  He expressed understanding and was appreciative of the call.

## 2017-06-09 DIAGNOSIS — M4800 Spinal stenosis, site unspecified: Secondary | ICD-10-CM | POA: Diagnosis not present

## 2017-06-09 DIAGNOSIS — I129 Hypertensive chronic kidney disease with stage 1 through stage 4 chronic kidney disease, or unspecified chronic kidney disease: Secondary | ICD-10-CM | POA: Diagnosis not present

## 2017-06-09 DIAGNOSIS — N183 Chronic kidney disease, stage 3 (moderate): Secondary | ICD-10-CM | POA: Diagnosis not present

## 2017-06-09 DIAGNOSIS — D696 Thrombocytopenia, unspecified: Secondary | ICD-10-CM | POA: Diagnosis not present

## 2017-06-09 DIAGNOSIS — I82411 Acute embolism and thrombosis of right femoral vein: Secondary | ICD-10-CM | POA: Diagnosis not present

## 2017-06-09 DIAGNOSIS — I2699 Other pulmonary embolism without acute cor pulmonale: Secondary | ICD-10-CM | POA: Diagnosis not present

## 2017-06-14 DIAGNOSIS — I2699 Other pulmonary embolism without acute cor pulmonale: Secondary | ICD-10-CM | POA: Diagnosis not present

## 2017-06-14 DIAGNOSIS — I82411 Acute embolism and thrombosis of right femoral vein: Secondary | ICD-10-CM | POA: Diagnosis not present

## 2017-06-14 DIAGNOSIS — N183 Chronic kidney disease, stage 3 (moderate): Secondary | ICD-10-CM | POA: Diagnosis not present

## 2017-06-14 DIAGNOSIS — I129 Hypertensive chronic kidney disease with stage 1 through stage 4 chronic kidney disease, or unspecified chronic kidney disease: Secondary | ICD-10-CM | POA: Diagnosis not present

## 2017-06-14 DIAGNOSIS — M4800 Spinal stenosis, site unspecified: Secondary | ICD-10-CM | POA: Diagnosis not present

## 2017-06-14 DIAGNOSIS — D696 Thrombocytopenia, unspecified: Secondary | ICD-10-CM | POA: Diagnosis not present

## 2017-06-16 DIAGNOSIS — D696 Thrombocytopenia, unspecified: Secondary | ICD-10-CM | POA: Diagnosis not present

## 2017-06-16 DIAGNOSIS — I2699 Other pulmonary embolism without acute cor pulmonale: Secondary | ICD-10-CM | POA: Diagnosis not present

## 2017-06-16 DIAGNOSIS — M4800 Spinal stenosis, site unspecified: Secondary | ICD-10-CM | POA: Diagnosis not present

## 2017-06-16 DIAGNOSIS — N183 Chronic kidney disease, stage 3 (moderate): Secondary | ICD-10-CM | POA: Diagnosis not present

## 2017-06-16 DIAGNOSIS — I129 Hypertensive chronic kidney disease with stage 1 through stage 4 chronic kidney disease, or unspecified chronic kidney disease: Secondary | ICD-10-CM | POA: Diagnosis not present

## 2017-06-16 DIAGNOSIS — I82411 Acute embolism and thrombosis of right femoral vein: Secondary | ICD-10-CM | POA: Diagnosis not present

## 2017-06-26 ENCOUNTER — Ambulatory Visit (INDEPENDENT_AMBULATORY_CARE_PROVIDER_SITE_OTHER): Payer: Medicare Other | Admitting: Pulmonary Disease

## 2017-06-26 ENCOUNTER — Encounter: Payer: Self-pay | Admitting: Pulmonary Disease

## 2017-06-26 VITALS — BP 110/78 | HR 98 | Ht 62.0 in | Wt 106.0 lb

## 2017-06-26 DIAGNOSIS — I2699 Other pulmonary embolism without acute cor pulmonale: Secondary | ICD-10-CM

## 2017-06-26 MED ORDER — APIXABAN 5 MG PO TABS: ORAL_TABLET | ORAL | 1 refills | Status: DC

## 2017-06-26 NOTE — Progress Notes (Signed)
Becky Leach    [id number]    Aug 01, 1929  Primary Care Physician:Becky Leach, Becky Nasuti, MD  Referring Physician: [name], MD [address] [place], [place] [address]  Chief complaint: Follow-up for pulmonary embolism.  HPI: 84 year old with past history of hypertension.  Admitted in May 11, 2017 with tachycardia, dizziness.  She had a CTA which showed submassive pulmonary embolism.  There are no precipitating factors for pulmonary embolism.  She is never suffered from DVT or pulmonary embolism before.  Although she has osteoarthritis she is still able to ambulate and is not bedbound.  She has had no recent prolonged travel she has had no recent surgeries other than an injection of her right knee.  She does not smoke she is not obese and has no known history of cancer. Pertinent to her risk for anticoagulation she did have peptic ulcer disease in 2013 due to NSAID use which has not recurred.  She has never had any unusual bleeding, she has not had recent surgery on her eyes brain or back.  She was anticoagulated with heparin and discharged on Eliquis.  She is here for follow-up after hospitalization.  Denies any dyspnea, fevers, chills, cough, sputum production.  Pets: None Occupation: Retired Secretary/administrator Exposures: No known exposures Smoking history: Never smoker Travel History: Not significant  Outpatient Encounter Medications as of 06/26/2017  Medication Sig  . acetaminophen (TYLENOL) 325 MG tablet Take 2 tablets (650 mg total) by mouth every 6 (six) hours as needed for pain.  Mar[place] Kitchen apixaban (ELIQUIS) 5 MG TABS tablet Take 2 tablets (10mg  total) twice daily for 11 doses followed by 1 tablet (5mg  total) twice daily.  . diclofenac sodium (VOLTAREN) 1 % GEL Apply 2 g topically 4 (four) times daily as needed.  . loratadine (CLARITIN) 10 MG tablet Take 1 tablet (10 mg total) by mouth daily.  . multivitamin-iron-minerals-folic acid (CENTRUM) chewable tablet Chew 1 tablet by mouth  daily.  . traMADol (ULTRAM) 50 MG tablet Take 0.5-1 tablet (25-50mg ) ONCE daily as needed for pain.   No facility-administered encounter medications on file as of 06/26/2017.     Allergies as of 06/26/2017 - Review Complete 06/26/2017  Allergen Reaction Noted  . Penicillins Rash 12/06/2011    Past Medical History:  Diagnosis Date  . AKI (acute kidney injury) ([place])    due to NSAID  . Arthritis   . Constipation   . DVT (deep venous thrombosis) ([place])   . Herniated disc   . Hypertension   . Pulmonary embolism ([place])   . Seasonal allergies   . Spinal stenosis   . Stomach ulcer    due to NSAID  . Tachycardia   . UTI (lower urinary tract infection)     Past Surgical History:  Procedure Laterality Date  . COLONOSCOPY  12/21/2011   Procedure: COLONOSCOPY;  Surgeon: Becky Horner, MD;  Location: [facility] ENDOSCOPY;  Service: Endoscopy;  Laterality: N/A;  . DILATION AND CURETTAGE OF UTERUS    . TONSILLECTOMY AND ADENOIDECTOMY      Family History  Problem Relation Age of Onset  . Arthritis Mother   . Heart attack Mother     Social History   Socioeconomic History  . Marital status: Single    Spouse name: Not on file  . Number of children: 4  . Years of education: Not on file  . Highest education level: Not on file  Social Needs  . Financial resource strain: Not on file  .  Food insecurity - worry: Not on file  . Food insecurity - inability: Not on file  . Transportation needs - medical: Not on file  . Transportation needs - non-medical: Not on file  Occupational History  . Not on file  Tobacco Use  . Smoking status: Never Smoker  . Smokeless tobacco: Never Used  Substance and Sexual Activity  . Alcohol use: No  . Drug use: No  . Sexual activity: No  Other Topics Concern  . Not on file  Social History Narrative   Born: [place], [place]   Lives with son, Becky Leach.            Review of systems: Review of Systems  Constitutional: Negative for fever and chills.  HENT:  Negative.   Eyes: Negative for blurred vision.  Respiratory: as per HPI  Cardiovascular: Negative for chest pain and palpitations.  Gastrointestinal: Negative for vomiting, diarrhea, blood per rectum. Genitourinary: Negative for dysuria, urgency, frequency and hematuria.  Musculoskeletal: Negative for myalgias, back pain and joint pain.  Skin: Negative for itching and rash.  Neurological: Negative for dizziness, tremors, focal weakness, seizures and loss of consciousness.  Endo/Heme/Allergies: Negative for environmental allergies.  Psychiatric/Behavioral: Negative for depression, suicidal ideas and hallucinations.  All other systems reviewed and are negative.  Physical Exam: Blood pressure 110/78, pulse 98, height 5\' 2"  (1.575 m), weight 106 lb (48.1 kg), SpO2 92 %. Gen:      No acute distress HEENT:  EOMI, sclera anicteric Neck:     No masses; no thyromegaly Lungs:    Clear to auscultation bilaterally; normal respiratory effort CV:         Regular rate and rhythm; no murmurs Abd:      + bowel sounds; soft, non-tender; no palpable masses, no distension Ext:    No edema; adequate peripheral perfusion Skin:      Warm and dry; no rash Neuro: alert and oriented x 3 Psych: normal mood and affect  Data Reviewed: Echocardiogram 05/11/17-LVEF 60-65%, high ventricular filling pressure, Right ventricle is mildly dilated.  Peak PA pressure 52.  Moderate pulmonary hypertension.  CT angiogram 05/11/17- extensive bilateral pulmonary embolism.  RV/LV ratio 1.6.  I have reviewed the images personally.  Lower extremity duplex 05/12/17-DVT in the right femoral and common femoral vein.  Assessment/Plan: Follow-up for submassive pulmonary embolism, right DVT She continues on Eliquis without any issue Would recommend indefinite anticoagulation for unprovoked venous thromboembolism.  Noted to have peptic ulcer in 2013 secondary to NSAID use with no recurrence. No significant risk factor for  bleeding. Follow-up in 6 months.  Becky Garfinkel MD [facility] Pager [phone] 06/26/2017, 3:47 PM  CC: Becky Kim, MD

## 2017-06-26 NOTE — Patient Instructions (Signed)
We will give you refills for Eliquis Follow-up in 6 months.

## 2017-06-28 DIAGNOSIS — M4800 Spinal stenosis, site unspecified: Secondary | ICD-10-CM | POA: Diagnosis not present

## 2017-06-28 DIAGNOSIS — I82411 Acute embolism and thrombosis of right femoral vein: Secondary | ICD-10-CM | POA: Diagnosis not present

## 2017-06-28 DIAGNOSIS — D696 Thrombocytopenia, unspecified: Secondary | ICD-10-CM | POA: Diagnosis not present

## 2017-06-28 DIAGNOSIS — I129 Hypertensive chronic kidney disease with stage 1 through stage 4 chronic kidney disease, or unspecified chronic kidney disease: Secondary | ICD-10-CM | POA: Diagnosis not present

## 2017-06-28 DIAGNOSIS — I2699 Other pulmonary embolism without acute cor pulmonale: Secondary | ICD-10-CM | POA: Diagnosis not present

## 2017-06-28 DIAGNOSIS — N183 Chronic kidney disease, stage 3 (moderate): Secondary | ICD-10-CM | POA: Diagnosis not present

## 2017-07-03 ENCOUNTER — Telehealth: Payer: Self-pay | Admitting: Family Medicine

## 2017-07-03 ENCOUNTER — Telehealth: Payer: Self-pay | Admitting: Pulmonary Disease

## 2017-07-03 MED ORDER — APIXABAN 5 MG PO TABS: ORAL_TABLET | ORAL | 1 refills | Status: DC

## 2017-07-03 NOTE — Telephone Encounter (Signed)
.   apixaban (ELIQUIS) 5 MG TABS tablet [[id number]]  Order Details  Dose, Route, Frequency: As Directed   Dispense Quantity: 60 tablet Refills: 1 Fills remaining: --        Sig: Take 2 tablets (10mg  total) twice daily for 11 doses followed by 1 tablet (5mg  total) twice daily.       Written Date: 06/26/17 Expiration Date: 06/26/18    Start Date: 06/26/17 End Date: --         Ordering Provider:  Marshell Garfinkel, MD DEA #:  [id number] NPI:  [id number]   Authorizing Provider:  Marshell Garfinkel, MD DEA #:  [id number] NPI:  [id number]      I called pt's son and left message to call back. I called the pharmacy and she stated the starter dose was on the Rx and her son wanted that part of the sig taken off. I sent in another Rx with a sig 1 tablet twice daily. I will await call back to let her son know that this was done.

## 2017-07-03 NOTE — Telephone Encounter (Signed)
pts  Son would like to request a referral to kidney specialist. He said he requested this when he talked to dr Reesa Chew on jani 10.  He is concerned this will be lost in the system again.

## 2017-07-03 NOTE — Telephone Encounter (Signed)
Son expecting dr Kris Mouton to call him back within 3 days.  He can also be reached at [phone]

## 2017-07-04 ENCOUNTER — Other Ambulatory Visit: Payer: Self-pay | Admitting: Family Medicine

## 2017-07-04 ENCOUNTER — Encounter: Payer: Self-pay | Admitting: *Deleted

## 2017-07-04 DIAGNOSIS — N183 Chronic kidney disease, stage 3 unspecified: Secondary | ICD-10-CM

## 2017-07-04 MED ORDER — APIXABAN 5 MG PO TABS: ORAL_TABLET | ORAL | 5 refills | Status: DC

## 2017-07-04 NOTE — Telephone Encounter (Signed)
Referral placed. If Dr. Kris Mouton agrees with the switch we can go ahead with it. Thanks.

## 2017-07-04 NOTE — Telephone Encounter (Signed)
Hello Becky Leach,  Here is my opinion/recommendation.  Although there is no documentation of CKD on patient's problem list, it was documented on his discharge summary dated 05/15/17. Also, a GFR of 55 places patient in the stage IIIa CKD range which requires monitoring and control of predisposing factors such as HTN or DM. While the patient does not need renal replacement therapy at this point, they can request a second opinion or preferred to be monitored by a nephrologist rather than an [place]. In that case, I will give them the referral. Let me know if you will like to call the patient's son back to discuss this; otherwise, I can call him instead. Let me know what you think.  Andreanna Mikolajczak

## 2017-07-04 NOTE — Telephone Encounter (Signed)
Family Medicine Progress Note  Called and discussed Nephrology referral. Informed patient that his mother's kidney function was stable at last check (1.07) and that her GFR was 55. This has been essentially unchanged over the last 5 years. I informed patient that in my opinion she would not benefit from a nephrology referral and that I did not believe this was medically necessary.  Son became very angry at this news and said that he and his mother both wanted a nephrology referral and that was the end of the discussion. Informed patient that such an aggressive tone was not necessary and that I was trying to have an informed discussion with him. I attempted to understand why he desired a such a referral. He states that he had a conversation with Dr. Reesa Chew back in December about why she was trying a BMP. Per note review this appears to have been ordered for surveillance as patient does have HTN. Son again became angry stating that she has a diagnosis of CKD stage III written in her chart. Informed patient that I was looking at her chart and that there was no such diagnosis. She does have a past problem of AKI that was resolved more than 4 years ago.  Patient's son said that he would get his nephrology referral, whether he had to switch provider's or not. I informed patient's son that it was his mother's right as a patient that she choose her provider. Further informed patient's son that despite his threats and angry tone, it would not be good patient care to make unnecessary referrals and that my opinion has not changed. He said that he would be calling my supervisor to be making these changes. Informed patient he was well within his right to call whomever he pleases. At this point the patient's son hung up on me.  Guadalupe Dawn MD PGY-1 Family Medicine Resident

## 2017-07-04 NOTE — Telephone Encounter (Signed)
This encounter was created in error - please disregard.

## 2017-07-04 NOTE — Progress Notes (Signed)
Requesting referral to nephrology to monitor CKD stage IIIa.  Referral placed based on patient's preference.

## 2017-07-04 NOTE — Telephone Encounter (Signed)
Discussed son's request for nephrology referral with Dr. Gwendlyn Deutscher (see phone note below) - will place nephrology referral and call patient back with appt info.  Also, son states he no longer wants to see Dr. Kris Mouton.  Recently saw Dr. Reesa Chew and is ok with switching to her.  Son will discuss with patient and see if she is ok with switching to Dr. Reesa Chew. I will call son back on Thursday for approval.  Burna Forts, BSN, RN-BC

## 2017-07-04 NOTE — Telephone Encounter (Signed)
Pt's son, Becky Leach is aware of below message and voiced his understanding.  Becky Leach requested that Eliquis be sent to pharmacy with enough refills to last until July.  I have sent Rx with 5 refills to preferred pharmacy.  Nothing further is needed.

## 2017-07-05 ENCOUNTER — Encounter: Payer: Self-pay | Admitting: Interventional Cardiology

## 2017-07-05 NOTE — Telephone Encounter (Signed)
I think a switch would be the right move at this point. It appears they have established good rapport with dr. Reesa Chew.  Guadalupe Dawn MD PGY-1 Family Medicine Resident

## 2017-07-11 ENCOUNTER — Ambulatory Visit: Payer: Medicare Other | Admitting: Interventional Cardiology

## 2017-07-13 DIAGNOSIS — I129 Hypertensive chronic kidney disease with stage 1 through stage 4 chronic kidney disease, or unspecified chronic kidney disease: Secondary | ICD-10-CM | POA: Diagnosis not present

## 2017-07-13 DIAGNOSIS — I82411 Acute embolism and thrombosis of right femoral vein: Secondary | ICD-10-CM | POA: Diagnosis not present

## 2017-07-13 DIAGNOSIS — I2699 Other pulmonary embolism without acute cor pulmonale: Secondary | ICD-10-CM | POA: Diagnosis not present

## 2017-07-13 DIAGNOSIS — N183 Chronic kidney disease, stage 3 (moderate): Secondary | ICD-10-CM | POA: Diagnosis not present

## 2017-07-13 DIAGNOSIS — M4800 Spinal stenosis, site unspecified: Secondary | ICD-10-CM | POA: Diagnosis not present

## 2017-07-13 DIAGNOSIS — D696 Thrombocytopenia, unspecified: Secondary | ICD-10-CM | POA: Diagnosis not present

## 2017-07-24 ENCOUNTER — Telehealth: Payer: Self-pay

## 2017-07-24 NOTE — Telephone Encounter (Signed)
Received message on nurse line from [place] with [facility]. A Plan of Care for this patient has been faxed several times in January and not received back. I called and asked her to fax again and placed in Dr Verlon Au box for signature. Dr Nori Riis is the name on file for the orders and it must be signed by attending. Please sign all pages and put in fax pile. Danley Danker, RN [facility] RN)

## 2017-07-25 NOTE — Telephone Encounter (Signed)
Done and faxed back Dorcas Mcmurray

## 2017-07-27 ENCOUNTER — Ambulatory Visit (INDEPENDENT_AMBULATORY_CARE_PROVIDER_SITE_OTHER): Payer: Medicare Other | Admitting: Physician Assistant

## 2017-08-27 ENCOUNTER — Encounter (HOSPITAL_COMMUNITY): Payer: Self-pay | Admitting: Emergency Medicine

## 2017-08-27 ENCOUNTER — Emergency Department (HOSPITAL_COMMUNITY): Payer: Medicare Other

## 2017-08-27 ENCOUNTER — Emergency Department (HOSPITAL_COMMUNITY)
Admission: EM | Admit: 2017-08-27 | Discharge: 2017-08-28 | Disposition: A | Discharge status: Home / Self Care | Payer: Medicare Other | Attending: Emergency Medicine | Admitting: Emergency Medicine

## 2017-08-27 DIAGNOSIS — Z7901 Long term (current) use of anticoagulants: Secondary | ICD-10-CM | POA: Diagnosis not present

## 2017-08-27 DIAGNOSIS — W010XXA Fall on same level from slipping, tripping and stumbling without subsequent striking against object, initial encounter: Secondary | ICD-10-CM | POA: Diagnosis not present

## 2017-08-27 DIAGNOSIS — S0990XA Unspecified injury of head, initial encounter: Secondary | ICD-10-CM | POA: Diagnosis not present

## 2017-08-27 DIAGNOSIS — I10 Essential (primary) hypertension: Secondary | ICD-10-CM | POA: Insufficient documentation

## 2017-08-27 DIAGNOSIS — Z79899 Other long term (current) drug therapy: Secondary | ICD-10-CM | POA: Insufficient documentation

## 2017-08-27 DIAGNOSIS — S42202A Unspecified fracture of upper end of left humerus, initial encounter for closed fracture: Secondary | ICD-10-CM | POA: Diagnosis not present

## 2017-08-27 DIAGNOSIS — S4992XA Unspecified injury of left shoulder and upper arm, initial encounter: Secondary | ICD-10-CM | POA: Diagnosis present

## 2017-08-27 DIAGNOSIS — S42212A Unspecified displaced fracture of surgical neck of left humerus, initial encounter for closed fracture: Secondary | ICD-10-CM | POA: Diagnosis not present

## 2017-08-27 DIAGNOSIS — Y92009 Unspecified place in unspecified non-institutional (private) residence as the place of occurrence of the external cause: Secondary | ICD-10-CM | POA: Diagnosis not present

## 2017-08-27 DIAGNOSIS — Y999 Unspecified external cause status: Secondary | ICD-10-CM | POA: Diagnosis not present

## 2017-08-27 DIAGNOSIS — W19XXXA Unspecified fall, initial encounter: Secondary | ICD-10-CM

## 2017-08-27 DIAGNOSIS — Y939 Activity, unspecified: Secondary | ICD-10-CM | POA: Insufficient documentation

## 2017-08-27 DIAGNOSIS — S098XXA Other specified injuries of head, initial encounter: Secondary | ICD-10-CM | POA: Diagnosis not present

## 2017-08-27 DIAGNOSIS — S0993XA Unspecified injury of face, initial encounter: Secondary | ICD-10-CM | POA: Diagnosis not present

## 2017-08-27 DIAGNOSIS — S199XXA Unspecified injury of neck, initial encounter: Secondary | ICD-10-CM | POA: Diagnosis not present

## 2017-08-27 NOTE — ED Triage Notes (Signed)
BIB EMS from home, pt had mechanical fall, tripped and fell over walker. Pt reports hitting head, does take Eliquis. Pt also reports L shoulder pain, no deformity noted. A/OX4.

## 2017-08-27 NOTE — ED Provider Notes (Signed)
[place] EMERGENCY DEPARTMENT Provider Note   CSN: [id number] Arrival date & time: 08/27/17  2216     History   Chief Complaint Chief Complaint  Patient presents with  . Fall    HPI Becky Leach is a 84 y.o. female.  The history is provided by the patient and a relative.  Fall  This is a new problem. The current episode started 1 to 2 hours ago. The problem occurs constantly. The problem has not changed since onset.Pertinent negatives include no chest pain, no abdominal pain, no headaches and no shortness of breath. Nothing aggravates the symptoms. Nothing relieves the symptoms. She has tried nothing for the symptoms.  tripped and fell hitting head and left shoulder.    Past Medical History:  Diagnosis Date  . AKI (acute kidney injury) ([place])    due to NSAID  . Arthritis   . Constipation   . DVT (deep venous thrombosis) ([place])   . Herniated disc   . Hypertension   . Pulmonary embolism ([place])   . Seasonal allergies   . Spinal stenosis   . Stomach ulcer    due to NSAID  . Tachycardia   . UTI (lower urinary tract infection)     Patient Active Problem List   Diagnosis Date Noted  . Thrombocytopenia ([place]) 05/25/2017  . Pulmonary embolism ([place]) 05/11/2017  . Need for immunization against influenza 03/18/2017  . Rhinorrhea 03/18/2017  . Impaired ambulation 01/12/2017  . Unilateral primary osteoarthritis, left knee 12/26/2016  . Unilateral primary osteoarthritis, right knee 12/26/2016  . Urge urinary incontinence 11/19/2014  . Pain of left lower extremity 08/28/2014  . Allergic rhinitis 02/04/2014  . Hot flashes 12/12/2013  . Spinal stenosis of lumbar region 11/14/2013  . Post-menopausal bleeding 12/19/2011  . Diarrhea 12/17/2011  . Benign essential HTN 12/06/2011    Past Surgical History:  Procedure Laterality Date  . COLONOSCOPY  12/21/2011   Procedure: COLONOSCOPY;  Surgeon: Wonda Horner, MD;  Location: [facility] ENDOSCOPY;  Service: Endoscopy;   Laterality: N/A;  . DILATION AND CURETTAGE OF UTERUS    . TONSILLECTOMY AND ADENOIDECTOMY       OB History    Gravida  4   Para  4   Term      Preterm      AB      Living        SAB      TAB      Ectopic      Multiple      Live Births               Home Medications    Prior to Admission medications   Medication Sig Start Date End Date Taking? Authorizing Provider  acetaminophen (TYLENOL) 325 MG tablet Take 2 tablets (650 mg total) by mouth every 6 (six) hours as needed for pain. 02/27/12   Dhungel, Nishant, MD  apixaban (ELIQUIS) 5 MG TABS tablet Take 1 tablet (5mg  total) twice daily. 07/04/17   Mannam, Hart Robinsons, MD  diclofenac sodium (VOLTAREN) 1 % GEL Apply 2 g topically 4 (four) times daily as needed. 03/16/17   Guadalupe Dawn, MD  loratadine (CLARITIN) 10 MG tablet Take 1 tablet (10 mg total) by mouth daily. 11/16/15   Janora Norlander, DO  multivitamin-iron-minerals-folic acid (CENTRUM) chewable tablet Chew 1 tablet by mouth daily.    [provider]  traMADol (ULTRAM) 50 MG tablet Take 0.5-1 tablet (25-50mg ) ONCE daily as needed for pain. 05/18/17  Guadalupe Dawn, MD    Family History Family History  Problem Relation Age of Onset  . Arthritis Mother   . Heart attack Mother     Social History Social History   Tobacco Use  . Smoking status: Never Smoker  . Smokeless tobacco: Never Used  Substance Use Topics  . Alcohol use: No  . Drug use: No     Allergies   Penicillins   Review of Systems Review of Systems  Constitutional: Negative for fever.  HENT: Negative for ear discharge.   Eyes: Negative for visual disturbance.  Respiratory: Negative for shortness of breath.   Cardiovascular: Negative for chest pain.  Gastrointestinal: Negative for abdominal pain.  Musculoskeletal: Positive for arthralgias. Negative for back pain and neck pain.  Neurological: Negative for seizures, speech difficulty, weakness, numbness and headaches.    All other systems reviewed and are negative.    Physical Exam Updated Vital Signs BP 119/88 (BP Location: Right Arm)   Pulse 84   Temp 98.2 F (36.8 C) (Oral)   Resp 16   Ht 5\' 3"  (1.6 m)   Wt 47.6 kg (105 lb)   SpO2 100%   BMI 18.60 kg/m   Physical Exam  Constitutional: She is oriented to person, place, and time. She appears well-developed and well-nourished. No distress.  HENT:  Head: Normocephalic and atraumatic. Head is without raccoon's eyes and without Battle's sign.  Right Ear: No mastoid tenderness. No hemotympanum.  Left Ear: No mastoid tenderness. No hemotympanum.  Nose: Nose normal.  Mouth/Throat: No oropharyngeal exudate.  Eyes: Pupils are equal, round, and reactive to light. Conjunctivae are normal.  Neck: Normal range of motion. Neck supple. No tracheal deviation present.  Cardiovascular: Normal rate, regular rhythm, normal heart sounds and intact distal pulses.  Pulmonary/Chest: Effort normal. No stridor. She has no wheezes. She has no rales. She exhibits no tenderness.  Abdominal: Soft. Bowel sounds are normal. She exhibits no mass. There is no tenderness. There is no rebound and no guarding.  Pelvis stable no foreshortening no external rotation of either hip  Musculoskeletal: Normal range of motion.       Right elbow: Normal.      Left elbow: Normal.       Right wrist: Normal.       Left wrist: Normal.       Right knee: Normal.       Left knee: Normal.       Right ankle: Normal. Achilles tendon normal.       Left ankle: Normal. Achilles tendon normal.       Cervical back: Normal.       Thoracic back: Normal.       Lumbar back: Normal.       Right hand: Normal. Normal sensation noted. Normal strength noted.       Left hand: Normal. Normal sensation noted. Normal strength noted.  Neurological: She is alert and oriented to person, place, and time.  Skin: Skin is warm and dry. Capillary refill takes less than 2 seconds.  Psychiatric: She has a normal mood  and affect.     ED Treatments / Results  Labs (all labs ordered are listed, but only abnormal results are displayed) Labs Reviewed - No data to display  EKG None  Radiology Results for orders placed or performed in visit on 05/25/17  CBC  Result Value Ref Range   WBC 5.6 3.4 - 10.8 x10E3/uL   RBC 4.70 3.77 - 5.28 x10E6/uL   Hemoglobin  13.4 11.1 - 15.9 g/dL   Hematocrit 41.3 34.0 - 46.6 %   MCV 88 79 - 97 fL   MCH 28.5 26.6 - 33.0 pg   MCHC 32.4 31.5 - 35.7 g/dL   RDW 15.1 12.3 - 15.4 %   Platelets 283 150 - 379 [id number]  Basic Metabolic Panel  Result Value Ref Range   Glucose 88 65 - 99 mg/dL   BUN 13 8 - 27 mg/dL   Creatinine, Ser 1.07 (H) 0.57 - 1.00 mg/dL   GFR calc non Af Amer 47 (L) >59 mL/min/1.73   GFR calc Af Amer 55 (L) >59 mL/min/1.73   BUN/Creatinine Ratio 12 12 - 28   Sodium 144 134 - 144 mmol/L   Potassium 4.5 3.5 - 5.2 mmol/L   Chloride 104 96 - 106 mmol/L   CO2 27 20 - 29 mmol/L   Calcium 10.0 8.7 - 10.3 mg/dL   Dg Shoulder Left  Result Date: 08/27/2017 CLINICAL DATA:  84 year old female with fall and left shoulder pain. EXAM: LEFT SHOULDER - 2+ VIEW COMPARISON:  None. FINDINGS: There is a mildly displaced oblique fracture of the left humeral neck. The humeral head remains in anatomic alignment with the glenoid. The bones are osteopenic. There is degenerative changes of the left shoulder. The soft tissues appear unremarkable. IMPRESSION: Mildly displaced fracture of the left humeral neck.  No dislocation. Electronically Signed   By: Anner Crete M.D.   On: 08/27/2017 23:26    Procedures Procedures (including critical care time)  Medications Ordered in ED Medications  traMADol (ULTRAM) tablet 50 mg (has no administration in time range)  acetaminophen (TYLENOL) tablet 1,000 mg (has no administration in time range)  lidocaine (LIDODERM) 5 % 1 patch (has no administration in time range)      Final Clinical Impressions(s) / ED Diagnoses   Return  for weakness, numbness, changes in vision or speech, fevers >100.4 unrelieved by medication, shortness of breath, intractable vomiting, or diarrhea, abdominal pain, Inability to tolerate liquids or food, cough, altered mental status or any concerns. No signs of systemic illness or infection. The patient is nontoxic-appearing on exam and vital signs are within normal limits.   I have reviewed the triage vital signs and the nursing notes. Pertinent labs &imaging results that were available during my care of the patient were reviewed by me and considered in my medical decision making (see chart for details).  After history, exam, and medical workup I feel the patient has been appropriately medically screened and is safe for discharge home. Pertinent diagnoses were discussed with the patient. Patient was given return precautions.     Eaton Folmar, MD 08/28/17 [phone]

## 2017-08-28 ENCOUNTER — Emergency Department (HOSPITAL_COMMUNITY): Payer: Medicare Other

## 2017-08-28 DIAGNOSIS — S199XXA Unspecified injury of neck, initial encounter: Secondary | ICD-10-CM | POA: Diagnosis not present

## 2017-08-28 DIAGNOSIS — S42202A Unspecified fracture of upper end of left humerus, initial encounter for closed fracture: Secondary | ICD-10-CM | POA: Diagnosis not present

## 2017-08-28 DIAGNOSIS — S0990XA Unspecified injury of head, initial encounter: Secondary | ICD-10-CM | POA: Diagnosis not present

## 2017-08-28 MED ORDER — LIDOCAINE 5 % EX PTCH
1.0000 | MEDICATED_PATCH | CUTANEOUS | Status: DC
  Administered 2017-08-28: 1 via TRANSDERMAL
  Filled 2017-08-28: qty 1

## 2017-08-28 MED ORDER — TRAMADOL HCL 50 MG PO TABS
50.0000 mg | ORAL_TABLET | ORAL | Status: AC
  Administered 2017-08-28: 50 mg via ORAL
  Filled 2017-08-28: qty 1

## 2017-08-28 MED ORDER — ACETAMINOPHEN 500 MG PO TABS
1000.0000 mg | ORAL_TABLET | Freq: Once | ORAL | Status: AC
  Administered 2017-08-28: 1000 mg via ORAL
  Filled 2017-08-28: qty 2

## 2017-08-28 MED ORDER — TRAMADOL HCL 50 MG PO TABS: 25.0000 mg | ORAL_TABLET | Freq: Four times a day (QID) | ORAL | 0 refills | Status: DC | PRN

## 2017-08-28 NOTE — ED Notes (Signed)
E-signature not available, pt and family verbalized understanding of DC instructions and prescriptions

## 2017-08-30 DIAGNOSIS — S42202A Unspecified fracture of upper end of left humerus, initial encounter for closed fracture: Secondary | ICD-10-CM | POA: Diagnosis not present

## 2017-09-05 DIAGNOSIS — D696 Thrombocytopenia, unspecified: Secondary | ICD-10-CM | POA: Diagnosis not present

## 2017-09-05 DIAGNOSIS — S42202D Unspecified fracture of upper end of left humerus, subsequent encounter for fracture with routine healing: Secondary | ICD-10-CM | POA: Diagnosis not present

## 2017-09-05 DIAGNOSIS — M48061 Spinal stenosis, lumbar region without neurogenic claudication: Secondary | ICD-10-CM | POA: Diagnosis not present

## 2017-09-05 DIAGNOSIS — Z86711 Personal history of pulmonary embolism: Secondary | ICD-10-CM | POA: Diagnosis not present

## 2017-09-05 DIAGNOSIS — Z9181 History of falling: Secondary | ICD-10-CM | POA: Diagnosis not present

## 2017-09-05 DIAGNOSIS — I10 Essential (primary) hypertension: Secondary | ICD-10-CM | POA: Diagnosis not present

## 2017-09-05 DIAGNOSIS — M170 Bilateral primary osteoarthritis of knee: Secondary | ICD-10-CM | POA: Diagnosis not present

## 2017-09-05 DIAGNOSIS — Z7901 Long term (current) use of anticoagulants: Secondary | ICD-10-CM | POA: Diagnosis not present

## 2017-09-07 DIAGNOSIS — S42202D Unspecified fracture of upper end of left humerus, subsequent encounter for fracture with routine healing: Secondary | ICD-10-CM | POA: Diagnosis not present

## 2017-09-07 DIAGNOSIS — I10 Essential (primary) hypertension: Secondary | ICD-10-CM | POA: Diagnosis not present

## 2017-09-07 DIAGNOSIS — D696 Thrombocytopenia, unspecified: Secondary | ICD-10-CM | POA: Diagnosis not present

## 2017-09-07 DIAGNOSIS — Z86711 Personal history of pulmonary embolism: Secondary | ICD-10-CM | POA: Diagnosis not present

## 2017-09-07 DIAGNOSIS — M170 Bilateral primary osteoarthritis of knee: Secondary | ICD-10-CM | POA: Diagnosis not present

## 2017-09-07 DIAGNOSIS — M48061 Spinal stenosis, lumbar region without neurogenic claudication: Secondary | ICD-10-CM | POA: Diagnosis not present

## 2017-09-11 ENCOUNTER — Emergency Department (HOSPITAL_COMMUNITY): Payer: Medicare Other

## 2017-09-11 ENCOUNTER — Encounter (HOSPITAL_COMMUNITY): Payer: Self-pay | Admitting: Emergency Medicine

## 2017-09-11 ENCOUNTER — Inpatient Hospital Stay (HOSPITAL_COMMUNITY)
Admission: EM | Admit: 2017-09-11 | Discharge: 2017-09-14 | DRG: 493 | Disposition: A | Discharge status: Home / Self Care | Payer: Medicare Other | Attending: Family Medicine | Admitting: Family Medicine

## 2017-09-11 ENCOUNTER — Other Ambulatory Visit: Payer: Self-pay

## 2017-09-11 DIAGNOSIS — S79912A Unspecified injury of left hip, initial encounter: Secondary | ICD-10-CM | POA: Diagnosis not present

## 2017-09-11 DIAGNOSIS — Z86711 Personal history of pulmonary embolism: Secondary | ICD-10-CM | POA: Diagnosis not present

## 2017-09-11 DIAGNOSIS — D62 Acute posthemorrhagic anemia: Secondary | ICD-10-CM | POA: Diagnosis not present

## 2017-09-11 DIAGNOSIS — Y92000 Kitchen of unspecified non-institutional (private) residence as  the place of occurrence of the external cause: Secondary | ICD-10-CM

## 2017-09-11 DIAGNOSIS — S0240DA Maxillary fracture, left side, initial encounter for closed fracture: Secondary | ICD-10-CM | POA: Diagnosis not present

## 2017-09-11 DIAGNOSIS — T148XXA Other injury of unspecified body region, initial encounter: Secondary | ICD-10-CM

## 2017-09-11 DIAGNOSIS — I10 Essential (primary) hypertension: Secondary | ICD-10-CM | POA: Diagnosis present

## 2017-09-11 DIAGNOSIS — M170 Bilateral primary osteoarthritis of knee: Secondary | ICD-10-CM | POA: Diagnosis present

## 2017-09-11 DIAGNOSIS — S02401A Maxillary fracture, unspecified, initial encounter for closed fracture: Secondary | ICD-10-CM | POA: Diagnosis present

## 2017-09-11 DIAGNOSIS — S42212D Unspecified displaced fracture of surgical neck of left humerus, subsequent encounter for fracture with routine healing: Secondary | ICD-10-CM | POA: Diagnosis not present

## 2017-09-11 DIAGNOSIS — I129 Hypertensive chronic kidney disease with stage 1 through stage 4 chronic kidney disease, or unspecified chronic kidney disease: Secondary | ICD-10-CM | POA: Diagnosis present

## 2017-09-11 DIAGNOSIS — S0181XA Laceration without foreign body of other part of head, initial encounter: Secondary | ICD-10-CM | POA: Diagnosis not present

## 2017-09-11 DIAGNOSIS — S42212A Unspecified displaced fracture of surgical neck of left humerus, initial encounter for closed fracture: Principal | ICD-10-CM | POA: Diagnosis present

## 2017-09-11 DIAGNOSIS — Z23 Encounter for immunization: Secondary | ICD-10-CM | POA: Diagnosis not present

## 2017-09-11 DIAGNOSIS — S42202A Unspecified fracture of upper end of left humerus, initial encounter for closed fracture: Secondary | ICD-10-CM | POA: Diagnosis present

## 2017-09-11 DIAGNOSIS — R000 Tachycardia, unspecified: Secondary | ICD-10-CM | POA: Diagnosis not present

## 2017-09-11 DIAGNOSIS — R509 Fever, unspecified: Secondary | ICD-10-CM | POA: Diagnosis not present

## 2017-09-11 DIAGNOSIS — Z79899 Other long term (current) drug therapy: Secondary | ICD-10-CM | POA: Diagnosis not present

## 2017-09-11 DIAGNOSIS — S0990XA Unspecified injury of head, initial encounter: Secondary | ICD-10-CM | POA: Diagnosis present

## 2017-09-11 DIAGNOSIS — S43015A Anterior dislocation of left humerus, initial encounter: Secondary | ICD-10-CM | POA: Diagnosis not present

## 2017-09-11 DIAGNOSIS — Z7901 Long term (current) use of anticoagulants: Secondary | ICD-10-CM

## 2017-09-11 DIAGNOSIS — Z86718 Personal history of other venous thrombosis and embolism: Secondary | ICD-10-CM

## 2017-09-11 DIAGNOSIS — Z8711 Personal history of peptic ulcer disease: Secondary | ICD-10-CM | POA: Diagnosis not present

## 2017-09-11 DIAGNOSIS — W010XXA Fall on same level from slipping, tripping and stumbling without subsequent striking against object, initial encounter: Secondary | ICD-10-CM | POA: Diagnosis not present

## 2017-09-11 DIAGNOSIS — Y92009 Unspecified place in unspecified non-institutional (private) residence as the place of occurrence of the external cause: Secondary | ICD-10-CM | POA: Diagnosis present

## 2017-09-11 DIAGNOSIS — N183 Chronic kidney disease, stage 3 (moderate): Secondary | ICD-10-CM | POA: Diagnosis not present

## 2017-09-11 DIAGNOSIS — S199XXA Unspecified injury of neck, initial encounter: Secondary | ICD-10-CM | POA: Diagnosis not present

## 2017-09-11 DIAGNOSIS — R296 Repeated falls: Secondary | ICD-10-CM | POA: Diagnosis not present

## 2017-09-11 DIAGNOSIS — M48061 Spinal stenosis, lumbar region without neurogenic claudication: Secondary | ICD-10-CM | POA: Diagnosis present

## 2017-09-11 DIAGNOSIS — Z880 Allergy status to penicillin: Secondary | ICD-10-CM | POA: Diagnosis not present

## 2017-09-11 DIAGNOSIS — D75839 Thrombocytosis, unspecified: Secondary | ICD-10-CM | POA: Diagnosis not present

## 2017-09-11 DIAGNOSIS — M1711 Unilateral primary osteoarthritis, right knee: Secondary | ICD-10-CM | POA: Diagnosis present

## 2017-09-11 DIAGNOSIS — W19XXXA Unspecified fall, initial encounter: Secondary | ICD-10-CM | POA: Diagnosis present

## 2017-09-11 DIAGNOSIS — S42309A Unspecified fracture of shaft of humerus, unspecified arm, initial encounter for closed fracture: Secondary | ICD-10-CM | POA: Diagnosis present

## 2017-09-11 DIAGNOSIS — S0083XA Contusion of other part of head, initial encounter: Secondary | ICD-10-CM | POA: Diagnosis not present

## 2017-09-11 DIAGNOSIS — Z9189 Other specified personal risk factors, not elsewhere classified: Secondary | ICD-10-CM

## 2017-09-11 DIAGNOSIS — S49092A Other physeal fracture of upper end of humerus, left arm, initial encounter for closed fracture: Secondary | ICD-10-CM | POA: Diagnosis not present

## 2017-09-11 DIAGNOSIS — E559 Vitamin D deficiency, unspecified: Secondary | ICD-10-CM | POA: Diagnosis not present

## 2017-09-11 DIAGNOSIS — S43005A Unspecified dislocation of left shoulder joint, initial encounter: Secondary | ICD-10-CM

## 2017-09-11 DIAGNOSIS — D473 Essential (hemorrhagic) thrombocythemia: Secondary | ICD-10-CM | POA: Diagnosis not present

## 2017-09-11 DIAGNOSIS — M25552 Pain in left hip: Secondary | ICD-10-CM | POA: Diagnosis not present

## 2017-09-11 DIAGNOSIS — M1712 Unilateral primary osteoarthritis, left knee: Secondary | ICD-10-CM | POA: Diagnosis present

## 2017-09-11 DIAGNOSIS — J302 Other seasonal allergic rhinitis: Secondary | ICD-10-CM | POA: Diagnosis not present

## 2017-09-11 DIAGNOSIS — R262 Difficulty in walking, not elsewhere classified: Secondary | ICD-10-CM | POA: Diagnosis present

## 2017-09-11 HISTORY — DX: Unspecified fracture of upper end of left humerus, initial encounter for closed fracture: S42.202A

## 2017-09-11 HISTORY — DX: Vitamin D deficiency, unspecified: E55.9

## 2017-09-11 LAB — URINALYSIS, ROUTINE W REFLEX MICROSCOPIC
BILIRUBIN URINE: NEGATIVE
Glucose, UA: NEGATIVE mg/dL
Hgb urine dipstick: NEGATIVE
KETONES UR: NEGATIVE mg/dL
Nitrite: NEGATIVE
PH: 7 (ref 5.0–8.0)
PROTEIN: NEGATIVE mg/dL
Specific Gravity, Urine: 1.012 (ref 1.005–1.030)

## 2017-09-11 LAB — BASIC METABOLIC PANEL
ANION GAP: 10 (ref 5–15)
BUN: 22 mg/dL — AB (ref 6–20)
CHLORIDE: 103 mmol/L (ref 101–111)
CO2: 25 mmol/L (ref 22–32)
Calcium: 9.3 mg/dL (ref 8.9–10.3)
Creatinine, Ser: 1.1 mg/dL — ABNORMAL HIGH (ref 0.44–1.00)
GFR calc Af Amer: 51 mL/min — ABNORMAL LOW (ref >60)
GFR, EST NON AFRICAN AMERICAN: 44 mL/min — AB (ref >60)
GLUCOSE: 119 mg/dL — AB (ref 65–99)
POTASSIUM: 4.1 mmol/L (ref 3.5–5.1)
Sodium: 138 mmol/L (ref 135–145)

## 2017-09-11 LAB — CBC WITH DIFFERENTIAL/PLATELET
BASOS ABS: 0 10*3/uL (ref 0.0–0.1)
Basophils Relative: 0 %
Eosinophils Absolute: 0 10*3/uL (ref 0.0–0.7)
Eosinophils Relative: 0 %
HEMATOCRIT: 34.9 % — AB (ref 36.0–46.0)
Hemoglobin: 11.6 g/dL — ABNORMAL LOW (ref 12.0–15.0)
LYMPHS PCT: 7 %
Lymphs Abs: 0.7 10*3/uL (ref 0.7–4.0)
MCH: 28.9 pg (ref 26.0–34.0)
MCHC: 33.2 g/dL (ref 30.0–36.0)
MCV: 87 fL (ref 78.0–100.0)
Monocytes Absolute: 0.6 10*3/uL (ref 0.1–1.0)
Monocytes Relative: 7 %
NEUTROS ABS: 7.8 10*3/uL — AB (ref 1.7–7.7)
Neutrophils Relative %: 86 %
Platelets: 391 10*3/uL (ref 150–400)
RBC: 4.01 MIL/uL (ref 3.87–5.11)
RDW: 14.9 % (ref 11.5–15.5)
WBC: 9 10*3/uL (ref 4.0–10.5)

## 2017-09-11 LAB — I-STAT CG4 LACTIC ACID, ED: Lactic Acid, Venous: 1.47 mmol/L (ref 0.5–1.9)

## 2017-09-11 MED ORDER — FENTANYL CITRATE (PF) 100 MCG/2ML IJ SOLN
25.0000 ug | Freq: Once | INTRAMUSCULAR | Status: AC
  Administered 2017-09-11: 25 ug via INTRAVENOUS
  Filled 2017-09-11: qty 2

## 2017-09-11 MED ORDER — TRAMADOL HCL 50 MG PO TABS
50.0000 mg | ORAL_TABLET | Freq: Once | ORAL | Status: AC
Start: 2017-09-11 — End: 2017-09-11
  Administered 2017-09-11: 50 mg via ORAL
  Filled 2017-09-11: qty 1

## 2017-09-11 MED ORDER — ACETAMINOPHEN 325 MG PO TABS
650.0000 mg | ORAL_TABLET | Freq: Once | ORAL | Status: AC
  Administered 2017-09-11: 650 mg via ORAL
  Filled 2017-09-11: qty 2

## 2017-09-11 MED ORDER — TETANUS-DIPHTH-ACELL PERTUSSIS 5-2.5-18.5 LF-MCG/0.5 IM SUSP
0.5000 mL | Freq: Once | INTRAMUSCULAR | Status: AC
  Administered 2017-09-11: 0.5 mL via INTRAMUSCULAR
  Filled 2017-09-11: qty 0.5

## 2017-09-11 NOTE — ED Notes (Signed)
Patient transported to CT 

## 2017-09-11 NOTE — ED Notes (Signed)
ED Provider at bedside. 

## 2017-09-11 NOTE — ED Provider Notes (Signed)
[facility] EMERGENCY DEPARTMENT Provider Note   CSN: [id number] Arrival date & time: 09/11/17  1550     History   Chief Complaint Chief Complaint  Patient presents with  . Fall    HPI Becky Leach is a 84 y.o. female.  The history is provided by the patient and medical records. No language interpreter was used.   Becky Leach is a 84 y.o. female  with a PMH as listed below who presents to the Emergency Department for evaluation following fall just prior to arrival. Patient states that she had mechanical fall on 3/31 where she fractured her shoulder. Her son went to run errands and told her not to get up.  She really wanted ice cream, therefore tried to walk to the kitchen.  She believes that she tripped.  She fell forward, striking the front of her head and her lip on the floor.  She denies loss of consciousness.  Nose and lip started bleeding. She is on Eliquis. She reports headache and swelling to the head as well as left hip and left shoulder pain. Pain to left shoulder does not feel much different than before her fall, but is worse with certain movements. No numbness, tingling, nausea, vomiting, chest pain, dizziness, lightheadedness, back pain or LE pain. No medications taken prior to arrival for her symptoms.   Past Medical History:  Diagnosis Date  . AKI (acute kidney injury) ([place])    due to NSAID  . Arthritis   . Constipation   . DVT (deep venous thrombosis) ([place])   . Herniated disc   . Hypertension   . Pulmonary embolism ([place])   . Seasonal allergies   . Spinal stenosis   . Stomach ulcer    due to NSAID  . Tachycardia   . UTI (lower urinary tract infection)     Patient Active Problem List   Diagnosis Date Noted  . Thrombocytopenia ([place]) 05/25/2017  . Pulmonary embolism ([place]) 05/11/2017  . Need for immunization against influenza 03/18/2017  . Rhinorrhea 03/18/2017  . Impaired ambulation 01/12/2017  . Unilateral primary osteoarthritis, left  knee 12/26/2016  . Unilateral primary osteoarthritis, right knee 12/26/2016  . Urge urinary incontinence 11/19/2014  . Pain of left lower extremity 08/28/2014  . Allergic rhinitis 02/04/2014  . Hot flashes 12/12/2013  . Spinal stenosis of lumbar region 11/14/2013  . Post-menopausal bleeding 12/19/2011  . Diarrhea 12/17/2011  . Benign essential HTN 12/06/2011    Past Surgical History:  Procedure Laterality Date  . COLONOSCOPY  12/21/2011   Procedure: COLONOSCOPY;  Surgeon: Wonda Horner, MD;  Location: [facility] ENDOSCOPY;  Service: Endoscopy;  Laterality: N/A;  . DILATION AND CURETTAGE OF UTERUS    . TONSILLECTOMY AND ADENOIDECTOMY       OB History    Gravida  4   Para  4   Term      Preterm      AB      Living        SAB      TAB      Ectopic      Multiple      Live Births               Home Medications    Prior to Admission medications   Medication Sig Start Date End Date Taking? Authorizing Provider  acetaminophen (TYLENOL) 325 MG tablet Take 2 tablets (650 mg total) by mouth every 6 (six) hours as needed for pain. 02/27/12  Yes Dhungel, Nishant, MD  apixaban (ELIQUIS) 5 MG TABS tablet Take 1 tablet (5mg  total) twice daily. 07/04/17  Yes Mannam, Praveen, MD  diclofenac sodium (VOLTAREN) 1 % GEL Apply 2 g topically 4 (four) times daily as needed. Patient taking differently: Apply 2 g topically 4 (four) times daily as needed (pain).  03/16/17  Yes Guadalupe Dawn, MD  loratadine (CLARITIN) 10 MG tablet Take 1 tablet (10 mg total) by mouth daily. 11/16/15  Yes Gottschalk, Leatrice Jewels M, DO  traMADol (ULTRAM) 50 MG tablet Take 0.5 tablets (25 mg total) by mouth every 6 (six) hours as needed for severe pain. 08/28/17  Yes Palumbo, April, MD    Family History Family History  Problem Relation Age of Onset  . Arthritis Mother   . Heart attack Mother     Social History Social History   Tobacco Use  . Smoking status: Never Smoker  . Smokeless tobacco: Never Used    Substance Use Topics  . Alcohol use: No  . Drug use: No     Allergies   Penicillins   Review of Systems Review of Systems  Musculoskeletal: Positive for arthralgias.  Skin: Positive for wound (Lip).  Neurological: Positive for headaches. Negative for dizziness, weakness and numbness.  Hematological: Bruises/bleeds easily.  All other systems reviewed and are negative.    Physical Exam Updated Vital Signs BP (!) 138/95   Pulse (!) 103   Temp (!) 100.6 F (38.1 C) (Rectal)   Resp 18   Ht 5\' 3"  (1.6 m)   Wt 47.6 kg (105 lb)   SpO2 99%   BMI 18.60 kg/m   Physical Exam  Constitutional: She is oriented to person, place, and time. She appears well-developed and well-nourished. No distress.  HENT:  Head: Normocephalic. Head is without raccoon's eyes and without Battle's sign.    No septal hematoma but dried blood in bilateral nares present. Bleeding abrasion to upper lip. No hemotympanum.   Neck:  No midline or paraspinal tenderness.  Cardiovascular: Normal rate, regular rhythm and normal heart sounds.  No murmur heard. Pulmonary/Chest: Effort normal and breath sounds normal. No respiratory distress.  Abdominal: Soft. She exhibits no distension. There is no tenderness.  Musculoskeletal:  No T/L spine tenderness. Tenderness to palpation of left proximal upper extremity (known humeral neck fx on 3.31).  5/5 strength i RUE, 4/5 in LUE. Tenderness to palpation of left lateral hip with no overlying skin changes. 5/5 muscle strength in LE's.  Neurological: She is alert and oriented to person, place, and time.  Speech clear and goal oriented. CN 2-12 grossly intact. Normal finger-to-nose and rapid alternating movements. No drift. Sensation intact.  Skin: Skin is warm and dry.  Nursing note and vitals reviewed.    ED Treatments / Results  Labs (all labs ordered are listed, but only abnormal results are displayed) Labs Reviewed  CBC WITH DIFFERENTIAL/PLATELET - Abnormal;  Notable for the following components:      Result Value   Hemoglobin 11.6 (*)    HCT 34.9 (*)    Neutro Abs 7.8 (*)    All other components within normal limits  BASIC METABOLIC PANEL - Abnormal; Notable for the following components:   Glucose, Bld 119 (*)    BUN 22 (*)    Creatinine, Ser 1.10 (*)    GFR calc non Af Amer 44 (*)    GFR calc Af Amer 51 (*)    All other components within normal limits  URINALYSIS, ROUTINE W REFLEX MICROSCOPIC -  Abnormal; Notable for the following components:   Leukocytes, UA SMALL (*)    Bacteria, UA RARE (*)    Squamous Epithelial / LPF 0-5 (*)    All other components within normal limits  URINE CULTURE  CULTURE, BLOOD (ROUTINE X 2)  CULTURE, BLOOD (ROUTINE X 2)  I-STAT CG4 LACTIC ACID, ED  I-STAT CG4 LACTIC ACID, ED    EKG None  Radiology Dg Chest 1 View  Result Date: 09/11/2017 CLINICAL DATA:  Fever EXAM: CHEST  1 VIEW COMPARISON:  CT chest 05/11/2017, radiograph 05/11/2017 FINDINGS: Inferiorly displaced left humeral head with partially visible proximal humerus fracture. No focal airspace disease or effusion. Stable enlarged cardiomediastinal silhouette with aortic atherosclerosis. No pneumothorax. IMPRESSION: 1. Cardiomegaly without acute pulmonary infiltrate 2. Partially visible fracture dislocation of the proximal left humerus Electronically Signed   By: Donavan Foil M.D.   On: 09/11/2017 22:51   Ct Head Wo Contrast  Result Date: 09/11/2017 CLINICAL DATA:  84 year old female post fall in kitchen. Denies loss of consciousness. Initial encounter. EXAM: CT HEAD WITHOUT CONTRAST CT MAXILLOFACIAL WITHOUT CONTRAST CT CERVICAL SPINE WITHOUT CONTRAST TECHNIQUE: Multidetector CT imaging of the head, cervical spine, and maxillofacial structures were performed using the standard protocol without intravenous contrast. Multiplanar CT image reconstructions of the cervical spine and maxillofacial structures were also generated. COMPARISON:  08/28/2017 head CT  and cervical spine CT. FINDINGS: CT HEAD FINDINGS Brain: No intracranial hemorrhage or CT evidence of large acute infarct. Prominent chronic microvascular changes. Atrophy No intracranial mass lesion noted on this unenhanced exam. Vascular: Vascular calcifications Skull: No skull fracture Other: Prominent hematoma left frontal/supraorbital region. CT MAXILLOFACIAL FINDINGS Osseous: Nondisplaced fracture anterior and possibly posterior aspect of the left maxillary sinus. Orbits: Orbital structures appear grossly intact. Sinuses: Fluid level within the left maxillary sinus may represent result of small hemorrhage. Soft tissues: Large left frontal/supraorbital hematoma. CT CERVICAL SPINE FINDINGS Alignment: No malalignment. Skull base and vertebrae: No cervical spine fracture. Soft tissues and spinal canal: No abnormal prevertebral soft tissue swelling. Disc levels:  Cervical spondylotic changes similar to prior exam. Upper chest: No worrisome abnormality. Other: No worrisome abnormality. IMPRESSION: CT HEAD Prominent hematoma left frontal/supraorbital region. No skull fracture or intracranial hemorrhage. Prominent chronic microvascular changes. Atrophy CT MAXILLOFACIAL Nondisplaced fracture anterior and possibly posterior wall of the left maxillary sinus. Fluid level within the left maxillary sinus may represent result of small hemorrhage. CT CERVICAL SPINE No cervical spine fracture or malalignment. No abnormal prevertebral soft tissue swelling. Results discussed with Roselyn Reef Earla Charlie physician's assistant at the time of imaging. Electronically Signed   By: Genia Del M.D.   On: 09/11/2017 18:15   Ct Cervical Spine Wo Contrast  Result Date: 09/11/2017 CLINICAL DATA:  84 year old female post fall in kitchen. Denies loss of consciousness. Initial encounter. EXAM: CT HEAD WITHOUT CONTRAST CT MAXILLOFACIAL WITHOUT CONTRAST CT CERVICAL SPINE WITHOUT CONTRAST TECHNIQUE: Multidetector CT imaging of the head, cervical  spine, and maxillofacial structures were performed using the standard protocol without intravenous contrast. Multiplanar CT image reconstructions of the cervical spine and maxillofacial structures were also generated. COMPARISON:  08/28/2017 head CT and cervical spine CT. FINDINGS: CT HEAD FINDINGS Brain: No intracranial hemorrhage or CT evidence of large acute infarct. Prominent chronic microvascular changes. Atrophy No intracranial mass lesion noted on this unenhanced exam. Vascular: Vascular calcifications Skull: No skull fracture Other: Prominent hematoma left frontal/supraorbital region. CT MAXILLOFACIAL FINDINGS Osseous: Nondisplaced fracture anterior and possibly posterior aspect of the left maxillary sinus. Orbits: Orbital structures appear  grossly intact. Sinuses: Fluid level within the left maxillary sinus may represent result of small hemorrhage. Soft tissues: Large left frontal/supraorbital hematoma. CT CERVICAL SPINE FINDINGS Alignment: No malalignment. Skull base and vertebrae: No cervical spine fracture. Soft tissues and spinal canal: No abnormal prevertebral soft tissue swelling. Disc levels:  Cervical spondylotic changes similar to prior exam. Upper chest: No worrisome abnormality. Other: No worrisome abnormality. IMPRESSION: CT HEAD Prominent hematoma left frontal/supraorbital region. No skull fracture or intracranial hemorrhage. Prominent chronic microvascular changes. Atrophy CT MAXILLOFACIAL Nondisplaced fracture anterior and possibly posterior wall of the left maxillary sinus. Fluid level within the left maxillary sinus may represent result of small hemorrhage. CT CERVICAL SPINE No cervical spine fracture or malalignment. No abnormal prevertebral soft tissue swelling. Results discussed with Roselyn Reef Garon Melander physician's assistant at the time of imaging. Electronically Signed   By: Genia Del M.D.   On: 09/11/2017 18:15   Dg Shoulder Left  Result Date: 09/11/2017 CLINICAL DATA:  Post reduction  EXAM: LEFT SHOULDER - 2+ VIEW COMPARISON:  09/11/2017, 08/27/2017 FINDINGS: AC joint is intact. Persistent inferior and anterior dislocation of the humeral head with respect to the glenoid fossa, increased compared to the most recent prior radiograph with the superior aspect of the humeral head positioned at the level of the inferior glenoid. Acute slightly comminuted proximal humerus fracture with close to 1 bone with of displacement of distal fracture fragment away from midline. Decreased angulation. IMPRESSION: 1. Decreased angulation of proximal humerus fracture but with residual close to 1 bone with of displacement of distal fracture fragment. 2. Persistent inferior and anterior dislocation of the humeral head with respect to the glenoid fossa, increased compared to the image performed earlier today. Electronically Signed   By: Donavan Foil M.D.   On: 09/11/2017 22:50   Dg Shoulder Left  Result Date: 09/11/2017 CLINICAL DATA:  Left shoulder pain following a fall today. Left humeral neck fracture 2 weeks ago. EXAM: LEFT SHOULDER - 2+ VIEW COMPARISON:  08/27/2017. FINDINGS: The previously demonstrated left humeral neck fracture is again demonstrated with interval 1/2 shaft width of lateral displacement and medial angulation of the distal fragment. There is also increased overlapping of the fragments. There is also interval anterior dislocation of the humeral head relative to the glenoid. No new fractures are seen. IMPRESSION: Increased displacement and angulation of the previously demonstrated left humeral neck fracture with interval anterior dislocation of the humeral head. Electronically Signed   By: Claudie Revering M.D.   On: 09/11/2017 17:41   Dg Hip Unilat W Or Wo Pelvis 2-3 Views Left  Result Date: 09/11/2017 CLINICAL DATA:  Left hip pain following a fall in the kitchen at home today. EXAM: DG HIP (WITH OR WITHOUT PELVIS) 2-3V LEFT COMPARISON:  None. FINDINGS: Normal appearing left hip without  fracture or dislocation. Lower lumbar spine degenerative changes. IMPRESSION: No hip fracture or dislocation seen. Lower lumbar spine degenerative changes. Electronically Signed   By: Claudie Revering M.D.   On: 09/11/2017 17:42   Ct Maxillofacial Wo Contrast  Result Date: 09/11/2017 CLINICAL DATA:  84 year old female post fall in kitchen. Denies loss of consciousness. Initial encounter. EXAM: CT HEAD WITHOUT CONTRAST CT MAXILLOFACIAL WITHOUT CONTRAST CT CERVICAL SPINE WITHOUT CONTRAST TECHNIQUE: Multidetector CT imaging of the head, cervical spine, and maxillofacial structures were performed using the standard protocol without intravenous contrast. Multiplanar CT image reconstructions of the cervical spine and maxillofacial structures were also generated. COMPARISON:  08/28/2017 head CT and cervical spine CT. FINDINGS: CT HEAD FINDINGS  Brain: No intracranial hemorrhage or CT evidence of large acute infarct. Prominent chronic microvascular changes. Atrophy No intracranial mass lesion noted on this unenhanced exam. Vascular: Vascular calcifications Skull: No skull fracture Other: Prominent hematoma left frontal/supraorbital region. CT MAXILLOFACIAL FINDINGS Osseous: Nondisplaced fracture anterior and possibly posterior aspect of the left maxillary sinus. Orbits: Orbital structures appear grossly intact. Sinuses: Fluid level within the left maxillary sinus may represent result of small hemorrhage. Soft tissues: Large left frontal/supraorbital hematoma. CT CERVICAL SPINE FINDINGS Alignment: No malalignment. Skull base and vertebrae: No cervical spine fracture. Soft tissues and spinal canal: No abnormal prevertebral soft tissue swelling. Disc levels:  Cervical spondylotic changes similar to prior exam. Upper chest: No worrisome abnormality. Other: No worrisome abnormality. IMPRESSION: CT HEAD Prominent hematoma left frontal/supraorbital region. No skull fracture or intracranial hemorrhage. Prominent chronic  microvascular changes. Atrophy CT MAXILLOFACIAL Nondisplaced fracture anterior and possibly posterior wall of the left maxillary sinus. Fluid level within the left maxillary sinus may represent result of small hemorrhage. CT CERVICAL SPINE No cervical spine fracture or malalignment. No abnormal prevertebral soft tissue swelling. Results discussed with Roselyn Reef Renell Allum physician's assistant at the time of imaging. Electronically Signed   By: Genia Del M.D.   On: 09/11/2017 18:15    Procedures Procedures (including critical care time)  Medications Ordered in ED Medications  Tdap (BOOSTRIX) injection 0.5 mL (0.5 mLs Intramuscular Given 09/11/17 1741)  traMADol (ULTRAM) tablet 50 mg (50 mg Oral Given 09/11/17 1939)  fentaNYL (SUBLIMAZE) injection 25 mcg (25 mcg Intravenous Given 09/11/17 2200)  acetaminophen (TYLENOL) tablet 650 mg (650 mg Oral Given 09/11/17 2325)     Initial Impression / Assessment and Plan / ED Course  I have reviewed the triage vital signs and the nursing notes.  Pertinent labs & imaging results that were available during my care of the patient were reviewed by me and considered in my medical decision making (see chart for details).    SOLA MARGOLIS is a 84 y.o. female who presents to ED for evaluation after fall just prior to arrival. Patient endorses this was a mechanical fall. Large hematoma and tenderness to the left maxillary sinus.   CT imaging reviewed: Prominent hematoma but no intracranial hemorrhage. C-spine with no acute abnormalities. Patient does have nondisplaced fracture of anterior and possibly posterior wall of left maxillary sinus. Will need ABX and ENT follow up.  Patient with known left humeral neck fx from fall on 3/31. Repeat x-ray today does show increased displacement and angulation of fracture. X-ray also shows new anterior dislocation. Spoke with orthopedics, Dr. Percell Miller, who recommends giving small dose of Fentanyl and attempt reduction. This was  attempted, but unsuccessful. Case again discussed with Dr. Percell Miller - orthopedics will see patient in the morning.   During evaluation, repeat temperature of 100.6. Denies recent illness. UA and CXR obtained without convincing source of infection. Urine cx sent. White count and lactic wdl.   Family practice teaching service consulted who will admit.  Patient seen by and discussed with Dr. Jeanell Sparrow who agrees with treatment plan.    Final Clinical Impressions(s) / ED Diagnoses   Final diagnoses:  Fever  Fall, initial encounter  Closed displaced fracture of surgical neck of left humerus with routine healing, unspecified fracture morphology, subsequent encounter  Dislocation of left shoulder joint, initial encounter  Closed fracture of maxillary sinus, initial encounter [facility])    ED Discharge Orders    None       Zayyan Mullen, Ozella Almond, PA-C 09/12/17 [phone]  Pattricia Boss, MD 09/12/17 [phone]

## 2017-09-11 NOTE — ED Triage Notes (Addendum)
Patient arrived to ED via [facility] from home. EMS reports:  Patient had unwitnessed fall in kitchen at home. Lives with son who was out at the store. Denies LOC. Patient scooted after fall from kitchen to living room. Patient had fall approx 2 weeks ago resulting in L shoulder fracture.  Hematoma noted on L side of head. Laceration noted on top lip.  Patient is on Eliquis.  VSS. BP 118/84, Pulse 98, Resp 16, 100% on room air.

## 2017-09-11 NOTE — ED Notes (Signed)
Ice pack applied to forehead.

## 2017-09-12 ENCOUNTER — Inpatient Hospital Stay (HOSPITAL_COMMUNITY): Payer: Medicare Other | Admitting: Certified Registered Nurse Anesthetist

## 2017-09-12 ENCOUNTER — Encounter (HOSPITAL_COMMUNITY)
Admission: EM | Disposition: A | Discharge status: Home / Self Care | Payer: Self-pay | Source: Home / Self Care | Attending: Family Medicine

## 2017-09-12 ENCOUNTER — Inpatient Hospital Stay (HOSPITAL_COMMUNITY): Payer: Medicare Other

## 2017-09-12 ENCOUNTER — Encounter (HOSPITAL_COMMUNITY): Payer: Self-pay

## 2017-09-12 DIAGNOSIS — R000 Tachycardia, unspecified: Secondary | ICD-10-CM | POA: Diagnosis present

## 2017-09-12 DIAGNOSIS — M170 Bilateral primary osteoarthritis of knee: Secondary | ICD-10-CM | POA: Diagnosis not present

## 2017-09-12 DIAGNOSIS — S42292A Other displaced fracture of upper end of left humerus, initial encounter for closed fracture: Secondary | ICD-10-CM

## 2017-09-12 DIAGNOSIS — S0990XA Unspecified injury of head, initial encounter: Secondary | ICD-10-CM | POA: Diagnosis not present

## 2017-09-12 DIAGNOSIS — S43015A Anterior dislocation of left humerus, initial encounter: Secondary | ICD-10-CM

## 2017-09-12 DIAGNOSIS — I10 Essential (primary) hypertension: Secondary | ICD-10-CM

## 2017-09-12 DIAGNOSIS — S42202A Unspecified fracture of upper end of left humerus, initial encounter for closed fracture: Secondary | ICD-10-CM | POA: Diagnosis not present

## 2017-09-12 DIAGNOSIS — Z9189 Other specified personal risk factors, not elsewhere classified: Secondary | ICD-10-CM | POA: Diagnosis not present

## 2017-09-12 DIAGNOSIS — R296 Repeated falls: Secondary | ICD-10-CM

## 2017-09-12 DIAGNOSIS — M48061 Spinal stenosis, lumbar region without neurogenic claudication: Secondary | ICD-10-CM | POA: Diagnosis not present

## 2017-09-12 DIAGNOSIS — S0083XA Contusion of other part of head, initial encounter: Secondary | ICD-10-CM | POA: Diagnosis not present

## 2017-09-12 DIAGNOSIS — S42212A Unspecified displaced fracture of surgical neck of left humerus, initial encounter for closed fracture: Secondary | ICD-10-CM | POA: Diagnosis not present

## 2017-09-12 DIAGNOSIS — N183 Chronic kidney disease, stage 3 (moderate): Secondary | ICD-10-CM | POA: Diagnosis not present

## 2017-09-12 DIAGNOSIS — R509 Fever, unspecified: Secondary | ICD-10-CM

## 2017-09-12 DIAGNOSIS — S02401A Maxillary fracture, unspecified, initial encounter for closed fracture: Secondary | ICD-10-CM | POA: Diagnosis not present

## 2017-09-12 DIAGNOSIS — S42232A 3-part fracture of surgical neck of left humerus, initial encounter for closed fracture: Secondary | ICD-10-CM | POA: Diagnosis not present

## 2017-09-12 DIAGNOSIS — Z86711 Personal history of pulmonary embolism: Secondary | ICD-10-CM | POA: Diagnosis not present

## 2017-09-12 DIAGNOSIS — J302 Other seasonal allergic rhinitis: Secondary | ICD-10-CM | POA: Diagnosis not present

## 2017-09-12 DIAGNOSIS — Y92009 Unspecified place in unspecified non-institutional (private) residence as the place of occurrence of the external cause: Secondary | ICD-10-CM | POA: Diagnosis present

## 2017-09-12 DIAGNOSIS — E559 Vitamin D deficiency, unspecified: Secondary | ICD-10-CM | POA: Diagnosis not present

## 2017-09-12 DIAGNOSIS — Y92000 Kitchen of unspecified non-institutional (private) residence as  the place of occurrence of the external cause: Secondary | ICD-10-CM | POA: Diagnosis not present

## 2017-09-12 DIAGNOSIS — I2699 Other pulmonary embolism without acute cor pulmonale: Secondary | ICD-10-CM | POA: Diagnosis not present

## 2017-09-12 DIAGNOSIS — S42212D Unspecified displaced fracture of surgical neck of left humerus, subsequent encounter for fracture with routine healing: Secondary | ICD-10-CM | POA: Diagnosis not present

## 2017-09-12 DIAGNOSIS — D62 Acute posthemorrhagic anemia: Secondary | ICD-10-CM | POA: Diagnosis not present

## 2017-09-12 DIAGNOSIS — W19XXXA Unspecified fall, initial encounter: Secondary | ICD-10-CM | POA: Diagnosis not present

## 2017-09-12 DIAGNOSIS — R262 Difficulty in walking, not elsewhere classified: Secondary | ICD-10-CM

## 2017-09-12 DIAGNOSIS — D75839 Thrombocytosis, unspecified: Secondary | ICD-10-CM | POA: Diagnosis not present

## 2017-09-12 DIAGNOSIS — Z23 Encounter for immunization: Secondary | ICD-10-CM | POA: Diagnosis not present

## 2017-09-12 DIAGNOSIS — W19XXXD Unspecified fall, subsequent encounter: Secondary | ICD-10-CM | POA: Diagnosis not present

## 2017-09-12 DIAGNOSIS — Z7901 Long term (current) use of anticoagulants: Secondary | ICD-10-CM | POA: Diagnosis not present

## 2017-09-12 DIAGNOSIS — I129 Hypertensive chronic kidney disease with stage 1 through stage 4 chronic kidney disease, or unspecified chronic kidney disease: Secondary | ICD-10-CM | POA: Diagnosis not present

## 2017-09-12 DIAGNOSIS — S42309A Unspecified fracture of shaft of humerus, unspecified arm, initial encounter for closed fracture: Secondary | ICD-10-CM | POA: Diagnosis present

## 2017-09-12 DIAGNOSIS — D473 Essential (hemorrhagic) thrombocythemia: Secondary | ICD-10-CM | POA: Diagnosis not present

## 2017-09-12 DIAGNOSIS — Z86718 Personal history of other venous thrombosis and embolism: Secondary | ICD-10-CM | POA: Diagnosis not present

## 2017-09-12 DIAGNOSIS — S0240DA Maxillary fracture, left side, initial encounter for closed fracture: Secondary | ICD-10-CM | POA: Diagnosis not present

## 2017-09-12 DIAGNOSIS — Z880 Allergy status to penicillin: Secondary | ICD-10-CM | POA: Diagnosis not present

## 2017-09-12 DIAGNOSIS — S43005A Unspecified dislocation of left shoulder joint, initial encounter: Secondary | ICD-10-CM | POA: Insufficient documentation

## 2017-09-12 DIAGNOSIS — Z8711 Personal history of peptic ulcer disease: Secondary | ICD-10-CM | POA: Diagnosis not present

## 2017-09-12 DIAGNOSIS — W010XXA Fall on same level from slipping, tripping and stumbling without subsequent striking against object, initial encounter: Secondary | ICD-10-CM | POA: Diagnosis not present

## 2017-09-12 DIAGNOSIS — S49092A Other physeal fracture of upper end of humerus, left arm, initial encounter for closed fracture: Secondary | ICD-10-CM | POA: Diagnosis not present

## 2017-09-12 DIAGNOSIS — Z79899 Other long term (current) drug therapy: Secondary | ICD-10-CM | POA: Diagnosis not present

## 2017-09-12 HISTORY — PX: ORIF HUMERUS FRACTURE: SHX2126

## 2017-09-12 LAB — CBC
HCT: 36.9 % (ref 36.0–46.0)
Hemoglobin: 12.5 g/dL (ref 12.0–15.0)
MCH: 29.6 pg (ref 26.0–34.0)
MCHC: 33.9 g/dL (ref 30.0–36.0)
MCV: 87.2 fL (ref 78.0–100.0)
PLATELETS: 414 10*3/uL — AB (ref 150–400)
RBC: 4.23 MIL/uL (ref 3.87–5.11)
RDW: 14.9 % (ref 11.5–15.5)
WBC: 7.9 10*3/uL (ref 4.0–10.5)

## 2017-09-12 LAB — BASIC METABOLIC PANEL
Anion gap: 11 (ref 5–15)
BUN: 14 mg/dL (ref 6–20)
CALCIUM: 9.7 mg/dL (ref 8.9–10.3)
CO2: 25 mmol/L (ref 22–32)
CREATININE: 1.1 mg/dL — AB (ref 0.44–1.00)
Chloride: 105 mmol/L (ref 101–111)
GFR calc Af Amer: 51 mL/min — ABNORMAL LOW (ref >60)
GFR, EST NON AFRICAN AMERICAN: 44 mL/min — AB (ref >60)
GLUCOSE: 110 mg/dL — AB (ref 65–99)
POTASSIUM: 4.4 mmol/L (ref 3.5–5.1)
SODIUM: 141 mmol/L (ref 135–145)

## 2017-09-12 LAB — PROTIME-INR
INR: 1.34
Prothrombin Time: 16.4 seconds — ABNORMAL HIGH (ref 11.4–15.2)

## 2017-09-12 LAB — I-STAT CG4 LACTIC ACID, ED: Lactic Acid, Venous: 1.28 mmol/L (ref 0.5–1.9)

## 2017-09-12 LAB — HEPARIN LEVEL (UNFRACTIONATED)

## 2017-09-12 LAB — APTT: aPTT: 31 seconds (ref 24–36)

## 2017-09-12 SURGERY — OPEN REDUCTION INTERNAL FIXATION (ORIF) DISTAL HUMERUS FRACTURE
Anesthesia: General | Laterality: Left

## 2017-09-12 MED ORDER — DOCUSATE SODIUM 100 MG PO CAPS
100.0000 mg | ORAL_CAPSULE | Freq: Two times a day (BID) | ORAL | Status: DC
  Administered 2017-09-12 – 2017-09-14 (×4): 100 mg via ORAL
  Filled 2017-09-12 (×4): qty 1

## 2017-09-12 MED ORDER — FENTANYL CITRATE (PF) 250 MCG/5ML IJ SOLN
INTRAMUSCULAR | Status: AC
  Filled 2017-09-12: qty 5

## 2017-09-12 MED ORDER — BUPIVACAINE HCL (PF) 0.25 % IJ SOLN
INTRAMUSCULAR | Status: AC
  Filled 2017-09-12: qty 30

## 2017-09-12 MED ORDER — ACETAMINOPHEN 10 MG/ML IV SOLN
1000.0000 mg | Freq: Three times a day (TID) | INTRAVENOUS | Status: DC
  Administered 2017-09-12: 1000 mg via INTRAVENOUS
  Filled 2017-09-12 (×2): qty 100

## 2017-09-12 MED ORDER — TRAMADOL HCL 50 MG PO TABS: 50.0000 mg | ORAL_TABLET | Freq: Three times a day (TID) | ORAL | Status: DC | PRN

## 2017-09-12 MED ORDER — ONDANSETRON HCL 4 MG/2ML IJ SOLN: 4.0000 mg | Freq: Four times a day (QID) | INTRAMUSCULAR | Status: DC | PRN

## 2017-09-12 MED ORDER — FENTANYL CITRATE (PF) 100 MCG/2ML IJ SOLN
25.0000 ug | INTRAMUSCULAR | Status: DC | PRN
  Administered 2017-09-12: 25 ug via INTRAVENOUS

## 2017-09-12 MED ORDER — OXYCODONE HCL 5 MG PO TABS: 5.0000 mg | ORAL_TABLET | Freq: Once | ORAL | Status: DC | PRN

## 2017-09-12 MED ORDER — MORPHINE SULFATE (PF) 4 MG/ML IV SOLN
2.0000 mg | INTRAVENOUS | Status: DC | PRN
  Administered 2017-09-12 (×3): 2 mg via INTRAVENOUS
  Filled 2017-09-12 (×3): qty 1

## 2017-09-12 MED ORDER — SODIUM CHLORIDE 0.9% FLUSH: 3.0000 mL | INTRAVENOUS | Status: DC | PRN

## 2017-09-12 MED ORDER — ONDANSETRON HCL 4 MG/2ML IJ SOLN
INTRAMUSCULAR | Status: DC | PRN
  Administered 2017-09-12: 4 mg via INTRAVENOUS

## 2017-09-12 MED ORDER — ROCURONIUM BROMIDE 10 MG/ML (PF) SYRINGE
PREFILLED_SYRINGE | INTRAVENOUS | Status: DC | PRN
  Administered 2017-09-12 (×2): 10 mg via INTRAVENOUS
  Administered 2017-09-12: 40 mg via INTRAVENOUS

## 2017-09-12 MED ORDER — PHENOL 1.4 % MT LIQD: 1.0000 | OROMUCOSAL | Status: DC | PRN

## 2017-09-12 MED ORDER — ONDANSETRON HCL 4 MG/2ML IJ SOLN: 4.0000 mg | Freq: Once | INTRAMUSCULAR | Status: DC | PRN

## 2017-09-12 MED ORDER — HEPARIN (PORCINE) IN NACL 100-0.45 UNIT/ML-% IJ SOLN
650.0000 [IU]/h | INTRAMUSCULAR | Status: DC
  Administered 2017-09-12: 650 [IU]/h via INTRAVENOUS
  Filled 2017-09-12: qty 250

## 2017-09-12 MED ORDER — FENTANYL CITRATE (PF) 250 MCG/5ML IJ SOLN
INTRAMUSCULAR | Status: DC | PRN
  Administered 2017-09-12 (×5): 50 ug via INTRAVENOUS

## 2017-09-12 MED ORDER — ACETAMINOPHEN 10 MG/ML IV SOLN: 1000.0000 mg | Freq: Four times a day (QID) | INTRAVENOUS | Status: DC

## 2017-09-12 MED ORDER — ONDANSETRON HCL 4 MG PO TABS: 4.0000 mg | ORAL_TABLET | Freq: Four times a day (QID) | ORAL | Status: DC | PRN

## 2017-09-12 MED ORDER — ACETAMINOPHEN 325 MG PO TABS
650.0000 mg | ORAL_TABLET | Freq: Four times a day (QID) | ORAL | Status: DC
  Administered 2017-09-12 – 2017-09-14 (×8): 650 mg via ORAL
  Filled 2017-09-12 (×8): qty 2

## 2017-09-12 MED ORDER — CEFAZOLIN SODIUM-DEXTROSE 1-4 GM/50ML-% IV SOLN
1.0000 g | Freq: Four times a day (QID) | INTRAVENOUS | Status: AC
  Administered 2017-09-12 – 2017-09-13 (×3): 1 g via INTRAVENOUS
  Filled 2017-09-12 (×3): qty 50

## 2017-09-12 MED ORDER — ACETAMINOPHEN 325 MG PO TABS: 650.0000 mg | ORAL_TABLET | Freq: Four times a day (QID) | ORAL | Status: DC | PRN

## 2017-09-12 MED ORDER — ACETAMINOPHEN 650 MG RE SUPP: 650.0000 mg | Freq: Four times a day (QID) | RECTAL | Status: DC | PRN

## 2017-09-12 MED ORDER — MENTHOL 3 MG MT LOZG: 1.0000 | LOZENGE | OROMUCOSAL | Status: DC | PRN

## 2017-09-12 MED ORDER — CEFAZOLIN SODIUM-DEXTROSE 2-4 GM/100ML-% IV SOLN
INTRAVENOUS | Status: AC
Start: 2017-09-12 — End: 2017-09-13
  Filled 2017-09-12: qty 100

## 2017-09-12 MED ORDER — POLYETHYLENE GLYCOL 3350 17 G PO PACK
17.0000 g | PACK | Freq: Every day | ORAL | Status: DC
  Administered 2017-09-13 – 2017-09-14 (×2): 17 g via ORAL
  Filled 2017-09-12 (×2): qty 1

## 2017-09-12 MED ORDER — POLYETHYLENE GLYCOL 3350 17 G PO PACK: 17.0000 g | PACK | Freq: Every day | ORAL | Status: DC

## 2017-09-12 MED ORDER — PHENYLEPHRINE 40 MCG/ML (10ML) SYRINGE FOR IV PUSH (FOR BLOOD PRESSURE SUPPORT)
PREFILLED_SYRINGE | INTRAVENOUS | Status: DC | PRN
  Administered 2017-09-12 (×5): 80 ug via INTRAVENOUS

## 2017-09-12 MED ORDER — WHITE PETROLATUM EX OINT
TOPICAL_OINTMENT | CUTANEOUS | Status: AC
  Administered 2017-09-13
  Filled 2017-09-12: qty 28.35

## 2017-09-12 MED ORDER — CHLORHEXIDINE GLUCONATE 4 % EX LIQD: 60.0000 mL | Freq: Once | CUTANEOUS | Status: DC

## 2017-09-12 MED ORDER — OXYCODONE HCL 5 MG/5ML PO SOLN: 5.0000 mg | Freq: Once | ORAL | Status: DC | PRN

## 2017-09-12 MED ORDER — SODIUM CHLORIDE 0.9 % IV SOLN: 250.0000 mL | INTRAVENOUS | Status: DC | PRN

## 2017-09-12 MED ORDER — CEFAZOLIN SODIUM-DEXTROSE 2-4 GM/100ML-% IV SOLN
2.0000 g | INTRAVENOUS | Status: AC
  Administered 2017-09-12: 2 g via INTRAVENOUS
  Filled 2017-09-12: qty 100

## 2017-09-12 MED ORDER — LORATADINE 10 MG PO TABS
10.0000 mg | ORAL_TABLET | Freq: Every day | ORAL | Status: DC
  Administered 2017-09-12 – 2017-09-14 (×3): 10 mg via ORAL
  Filled 2017-09-12 (×3): qty 1

## 2017-09-12 MED ORDER — SODIUM CHLORIDE 0.9% FLUSH
3.0000 mL | Freq: Two times a day (BID) | INTRAVENOUS | Status: DC
  Administered 2017-09-12 (×2): 3 mL via INTRAVENOUS

## 2017-09-12 MED ORDER — METOCLOPRAMIDE HCL 5 MG/ML IJ SOLN: 5.0000 mg | Freq: Three times a day (TID) | INTRAMUSCULAR | Status: DC | PRN

## 2017-09-12 MED ORDER — PROPOFOL 10 MG/ML IV BOLUS
INTRAVENOUS | Status: DC | PRN
  Administered 2017-09-12: 100 mg via INTRAVENOUS

## 2017-09-12 MED ORDER — DEXAMETHASONE SODIUM PHOSPHATE 10 MG/ML IJ SOLN
INTRAMUSCULAR | Status: DC | PRN
  Administered 2017-09-12: 10 mg via INTRAVENOUS

## 2017-09-12 MED ORDER — 0.9 % SODIUM CHLORIDE (POUR BTL) OPTIME
TOPICAL | Status: DC | PRN
  Administered 2017-09-12: 1000 mL

## 2017-09-12 MED ORDER — LACTATED RINGERS IV SOLN
INTRAVENOUS | Status: DC | PRN
  Administered 2017-09-12 (×2): via INTRAVENOUS

## 2017-09-12 MED ORDER — FENTANYL CITRATE (PF) 100 MCG/2ML IJ SOLN
INTRAMUSCULAR | Status: AC
  Filled 2017-09-12: qty 2

## 2017-09-12 MED ORDER — METOCLOPRAMIDE HCL 5 MG PO TABS: 5.0000 mg | ORAL_TABLET | Freq: Three times a day (TID) | ORAL | Status: DC | PRN

## 2017-09-12 MED ORDER — LIDOCAINE 2% (20 MG/ML) 5 ML SYRINGE
INTRAMUSCULAR | Status: DC | PRN
  Administered 2017-09-12: 40 mg via INTRAVENOUS

## 2017-09-12 MED ORDER — POVIDONE-IODINE 10 % EX SWAB: 2.0000 "application " | Freq: Once | CUTANEOUS | Status: DC

## 2017-09-12 SURGICAL SUPPLY — 79 items
BANDAGE ACE 4X5 VEL STRL LF (GAUZE/BANDAGES/DRESSINGS) ×3 IMPLANT
BIT DRILL 3.2 (BIT) ×2
BIT DRILL 3.2XCALB NS DISP (BIT) ×1 IMPLANT
BIT DRILL CALIBRATED 2.7 (BIT) ×2 IMPLANT
BIT DRILL CALIBRATED 2.7MM (BIT) ×1
BIT DRL 3.2XCALB NS DISP (BIT) ×1
BLADE AVERAGE 25MMX9MM (BLADE)
BLADE AVERAGE 25X9 (BLADE) IMPLANT
BNDG ESMARK 4X9 LF (GAUZE/BANDAGES/DRESSINGS) ×3 IMPLANT
BNDG GAUZE ELAST 4 BULKY (GAUZE/BANDAGES/DRESSINGS) ×6 IMPLANT
BONE CANC CHIPS 40CC CAN1/2 (Bone Implant) ×3 IMPLANT
BRUSH SCRUB SURG 4.25 DISP (MISCELLANEOUS) ×6 IMPLANT
CHIPS CANC BONE 40CC CAN1/2 (Bone Implant) ×1 IMPLANT
CORDS BIPOLAR (ELECTRODE) ×3 IMPLANT
COVER SURGICAL LIGHT HANDLE (MISCELLANEOUS) ×6 IMPLANT
DRAIN PENROSE 1/4X12 LTX STRL (WOUND CARE) IMPLANT
DRAPE C-ARM 42X72 X-RAY (DRAPES) ×3 IMPLANT
DRAPE C-ARMOR (DRAPES) IMPLANT
DRAPE HALF SHEET 40X57 (DRAPES) ×3 IMPLANT
DRAPE INCISE IOBAN 66X45 STRL (DRAPES) IMPLANT
DRAPE ORTHO SPLIT 77X108 STRL (DRAPES)
DRAPE SURG ORHT 6 SPLT 77X108 (DRAPES) IMPLANT
DRAPE U-SHAPE 47X51 STRL (DRAPES) ×6 IMPLANT
DRSG ADAPTIC 3X8 NADH LF (GAUZE/BANDAGES/DRESSINGS) ×3 IMPLANT
DRSG MEPILEX BORDER 4X12 (GAUZE/BANDAGES/DRESSINGS) ×3 IMPLANT
DRSG PAD ABDOMINAL 8X10 ST (GAUZE/BANDAGES/DRESSINGS) ×3 IMPLANT
ELECT REM PT RETURN 9FT ADLT (ELECTROSURGICAL) ×3
ELECTRODE REM PT RTRN 9FT ADLT (ELECTROSURGICAL) ×1 IMPLANT
EVACUATOR 1/8 PVC DRAIN (DRAIN) IMPLANT
GAUZE SPONGE 4X4 12PLY STRL (GAUZE/BANDAGES/DRESSINGS) ×3 IMPLANT
GLOVE BIO SURGEON STRL SZ7.5 (GLOVE) ×3 IMPLANT
GLOVE BIOGEL PI IND STRL 7.5 (GLOVE) ×1 IMPLANT
GLOVE BIOGEL PI IND STRL 8 (GLOVE) ×1 IMPLANT
GLOVE BIOGEL PI INDICATOR 7.5 (GLOVE) ×2
GLOVE BIOGEL PI INDICATOR 8 (GLOVE) ×2
GOWN STRL REUS W/ TWL LRG LVL3 (GOWN DISPOSABLE) ×2 IMPLANT
GOWN STRL REUS W/ TWL XL LVL3 (GOWN DISPOSABLE) ×1 IMPLANT
GOWN STRL REUS W/TWL LRG LVL3 (GOWN DISPOSABLE) ×4
GOWN STRL REUS W/TWL XL LVL3 (GOWN DISPOSABLE) ×2
K-WIRE 2X5 SS THRDED S3 (WIRE) ×6
KIT BASIN OR (CUSTOM PROCEDURE TRAY) ×3 IMPLANT
KIT TURNOVER KIT B (KITS) ×3 IMPLANT
KWIRE 2X5 SS THRDED S3 (WIRE) ×2 IMPLANT
MANIFOLD NEPTUNE II (INSTRUMENTS) ×3 IMPLANT
NEEDLE HYPO 25X1 1.5 SAFETY (NEEDLE) ×3 IMPLANT
NS IRRIG 1000ML POUR BTL (IV SOLUTION) ×3 IMPLANT
PACK ORTHO EXTREMITY (CUSTOM PROCEDURE TRAY) ×3 IMPLANT
PAD ARMBOARD 7.5X6 YLW CONV (MISCELLANEOUS) ×6 IMPLANT
PADDING CAST COTTON 6X4 STRL (CAST SUPPLIES) ×3 IMPLANT
PEG LOCKING 3.2MMX44 (Peg) ×3 IMPLANT
PEG LOCKING 3.2MMX46 (Peg) ×6 IMPLANT
PEG LOCKING 3.2X 28MM (Peg) ×3 IMPLANT
PEG LOCKING 3.2X36 (Screw) ×3 IMPLANT
PEG LOCKING 3.2X42 (Screw) ×3 IMPLANT
PEG LOCKING 3.2X58MM (Peg) ×3 IMPLANT
PLATE PROX HUM HI L 7H 140 (Plate) ×6 IMPLANT
SCREW LOCK CORT STAR 3.5X24 (Screw) ×3 IMPLANT
SCREW LOW PROF TIS 3.5X28MM (Screw) ×3 IMPLANT
SCREW LP NL T15 3.5X22 (Screw) ×3 IMPLANT
SCREW LP NL T15 3.5X24 (Screw) ×3 IMPLANT
SCREW T15 LP CORT 3.5X48MM NS (Screw) ×3 IMPLANT
SLING ARM FOAM STRAP MED (SOFTGOODS) ×3 IMPLANT
SPONGE LAP 18X18 X RAY DECT (DISPOSABLE) IMPLANT
STAPLER VISISTAT 35W (STAPLE) ×3 IMPLANT
STOCKINETTE IMPERVIOUS 9X36 MD (GAUZE/BANDAGES/DRESSINGS) IMPLANT
SUCTION FRAZIER HANDLE 10FR (MISCELLANEOUS) ×2
SUCTION TUBE FRAZIER 10FR DISP (MISCELLANEOUS) ×1 IMPLANT
SUT ETHILON 3 0 PS 1 (SUTURE) ×9 IMPLANT
SUT VIC AB 0 CT1 27 (SUTURE) ×4
SUT VIC AB 0 CT1 27XBRD ANBCTR (SUTURE) ×2 IMPLANT
SUT VIC AB 2-0 CT1 27 (SUTURE) ×6
SUT VIC AB 2-0 CT1 TAPERPNT 27 (SUTURE) ×3 IMPLANT
SYR 5ML LL (SYRINGE) IMPLANT
SYR CONTROL 10ML LL (SYRINGE) ×3 IMPLANT
TOWEL OR 17X24 6PK STRL BLUE (TOWEL DISPOSABLE) ×3 IMPLANT
TOWEL OR 17X26 10 PK STRL BLUE (TOWEL DISPOSABLE) ×9 IMPLANT
TRAY FOLEY W/METER SILVER 16FR (SET/KITS/TRAYS/PACK) IMPLANT
WATER STERILE IRR 1000ML POUR (IV SOLUTION) ×3 IMPLANT
YANKAUER SUCT BULB TIP NO VENT (SUCTIONS) IMPLANT

## 2017-09-12 NOTE — Anesthesia Preprocedure Evaluation (Signed)
Anesthesia Evaluation  Patient identified by MRN, date of birth, ID band Patient awake    Reviewed: Allergy & Precautions, NPO status , Patient's Chart, lab work & pertinent test results  Airway Mallampati: II  TM Distance: >3 FB Neck ROM: Full    Dental  (+) Teeth Intact, Dental Advisory Given   Pulmonary    breath sounds clear to auscultation       Cardiovascular hypertension,  Rhythm:Regular Rate:Normal     Neuro/Psych    GI/Hepatic   Endo/Other    Renal/GU      Musculoskeletal   Abdominal   Peds  Hematology   Anesthesia Other Findings   Reproductive/Obstetrics                             Anesthesia Physical Anesthesia Plan  ASA: III  Anesthesia Plan: General   Post-op Pain Management:  Regional for Post-op pain   Induction: Intravenous  PONV Risk Score and Plan: Ondansetron and Dexamethasone  Airway Management Planned: Oral ETT  Additional Equipment:   Intra-op Plan:   Post-operative Plan: Extubation in OR  Informed Consent: I have reviewed the patients History and Physical, chart, labs and discussed the procedure including the risks, benefits and alternatives for the proposed anesthesia with the patient or authorized representative who has indicated his/her understanding and acceptance.   Dental advisory given  Plan Discussed with: CRNA and Anesthesiologist  Anesthesia Plan Comments: (L. Proximal hmurus Fx L. Maxillary sinus fracture Hypertension H/O AKI creatinine now 1.10  Plan GA with interscalene block)        Anesthesia Quick Evaluation

## 2017-09-12 NOTE — Progress Notes (Signed)
ANTICOAGULATION CONSULT NOTE - Initial Consult  Pharmacy Consult for Heparin Indication: pulmonary embolus  Allergies  Allergen Reactions  . Penicillins Rash    Patient Measurements: Height: 5\' 3"  (160 cm) Weight: 105 lb (47.6 kg) IBW/kg (Calculated) : 52.4  Vital Signs: Temp: 100.6 F (38.1 C) (04/15 2214) Temp Source: Rectal (04/15 2214) BP: 122/89 (04/16 0100) Pulse Rate: 103 (04/16 0100)  Labs: Recent Labs    09/11/17 1918  HGB 11.6*  HCT 34.9*  PLT 391  CREATININE 1.10*    Estimated Creatinine Clearance: 27.6 mL/min (A) (by C-G formula based on SCr of 1.1 mg/dL (H)).   Medical History: Past Medical History:  Diagnosis Date  . AKI (acute kidney injury) ([place])    due to NSAID  . Arthritis   . Constipation   . DVT (deep venous thrombosis) ([place])   . Herniated disc   . Hypertension   . Pulmonary embolism ([place])   . Seasonal allergies   . Spinal stenosis   . Stomach ulcer    due to NSAID  . Tachycardia   . UTI (lower urinary tract infection)     Medications:  No current facility-administered medications on file prior to encounter.    Current Outpatient Medications on File Prior to Encounter  Medication Sig Dispense Refill  . acetaminophen (TYLENOL) 325 MG tablet Take 2 tablets (650 mg total) by mouth every 6 (six) hours as needed for pain. 30 tablet 0  . apixaban (ELIQUIS) 5 MG TABS tablet Take 1 tablet (5mg  total) twice daily. 60 tablet 5  . diclofenac sodium (VOLTAREN) 1 % GEL Apply 2 g topically 4 (four) times daily as needed. (Patient taking differently: Apply 2 g topically 4 (four) times daily as needed (pain). ) 300 g 5  . loratadine (CLARITIN) 10 MG tablet Take 1 tablet (10 mg total) by mouth daily. 30 tablet 11  . traMADol (ULTRAM) 50 MG tablet Take 0.5 tablets (25 mg total) by mouth every 6 (six) hours as needed for severe pain. 6 tablet 0     Assessment: 84 y.o. female with h/o PE, Eliquis on hold, for heparin  Goal of Therapy:  APTT 66-102  while Eliquis affecting heparin level  Heparin level 0.3-0.7 units/ml Monitor platelets by anticoagulation protocol: Yes   Plan:  Start heparin 650 units/hr APTT in 8 hours  Demone Lyles, Bronson Curb 09/12/2017,1:17 AM

## 2017-09-12 NOTE — Anesthesia Procedure Notes (Signed)
Procedure Name: Intubation Date/Time: 09/12/2017 4:53 PM Performed by: Teressa Lower., CRNA Pre-anesthesia Checklist: Patient identified, Emergency Drugs available, Suction available and Patient being monitored Patient Re-evaluated:Patient Re-evaluated prior to induction Oxygen Delivery Method: Circle system utilized Preoxygenation: Pre-oxygenation with 100% oxygen Induction Type: IV induction Ventilation: Mask ventilation without difficulty Laryngoscope Size: Miller and 2 Grade View: Grade I Tube type: Oral Tube size: 7.0 mm Number of attempts: 1 Airway Equipment and Method: Stylet and Oral airway Placement Confirmation: ETT inserted through vocal cords under direct vision,  positive ETCO2 and breath sounds checked- equal and bilateral Secured at: 20 cm Tube secured with: Tape Dental Injury: Teeth and Oropharynx as per pre-operative assessment

## 2017-09-12 NOTE — Brief Op Note (Signed)
09/11/2017 - 09/12/2017  7:04 PM  PATIENT:  Becky Leach  84 y.o. female  PRE-OPERATIVE DIAGNOSIS:   1. PROXIMAL HUMERUS FRACTURE, LEFT 2. SUSPECTED PSEUDODISLOCATION LEFT SHOULDER  POST-OPERATIVE DIAGNOSIS:   1. PROXIMAL HUMERUS FRACTURE, LEFT 2. PSEUDODISLOCATION LEFT SHOULDER  PROCEDURE:  Procedure(s): OPEN REDUCTION INTERNAL FIXATION (ORIF) PROXIMAL  HUMERUS FRACTURE (Left)  SURGEON:  Surgeon(s) and Role:    [place], MD - Primary  PHYSICIAN ASSISTANT: 1. KEITH PAUL, PA-C; 2. PA Student  ANESTHESIA:   general  EBL:  80 mL   BLOOD ADMINISTERED:none  DRAINS: none   LOCAL MEDICATIONS USED:  NONE  SPECIMEN:  No Specimen  DISPOSITION OF SPECIMEN:  N/A  COUNTS:  YES  TOURNIQUET:  * No tourniquets in log *  DICTATION: .Other Dictation: Dictation Number [id number]  PLAN OF CARE: Admit to inpatient   PATIENT DISPOSITION:  PACU - hemodynamically stable.   Delay start of Pharmacological VTE agent (>24hrs) due to surgical blood loss or risk of bleeding: no

## 2017-09-12 NOTE — Progress Notes (Signed)
PT Cancellation Note  Patient Details Name: JANEAL ABADI MRN: [id number] DOB: 1930-05-04   Cancelled Treatment:    Reason Eval/Treat Not Completed: Medical issues which prohibited therapy RN requesting to hold PT; Per RN plan for ORIF to address L humerus fracture. Will follow up after surgery as schedule allows.   Leighton Ruff, PT, DPT  Acute Rehabilitation Services  Pager: [phone]   Rudean Hitt 09/12/2017, 11:13 AM

## 2017-09-12 NOTE — ED Notes (Signed)
Admitting MD at bedside.

## 2017-09-12 NOTE — Consult Note (Signed)
Orthopaedic Trauma Service (OTS) Consult   Patient ID: Becky Leach MRN: [id number] DOB/AGE: 29-Sep-1929 84 y.o.   Reason for Consult: recurrent fall with displacement and dislocation of 30 week old L proximal humerus fracture  Referring Physician: Lissa Morales, MD (internal medicine)   HPI: Becky Leach is an 84 y.o. right-hand-dominant black female who sustained a fall yesterday afternoon.  This was unwitnessed.  She fell approximately 2 weeks ago stating the relatively nondisplaced left proximal humerus fracture that was being treated nonoperatively.  Patient was seen and evaluated in our office on 08/30/2017 by Dr. Judee Clara.  Alignment has been maintained and continuation with conservative management was pursued.  Again unfortunately patient fell yesterday.  She was brought to the emergency department where additional imaging demonstrates significant displacement of her left proximal humerus fracture along with what appears to be anterior dislocation of the humeral head although no axial imaging is available.  It appears that there wears some attempts at closed reduction in the emergency department which were unsuccessful. Orthopedic trauma service contacted regarding persistent dislocation of her proximal humerus along with displacement of her proximal humerus fracture.  Patient is on chronic anticoagulation for history of PE, she is on Eliquis.  Currently she is on heparin drip.  Patient again is right-hand dominant.  Son lives with her.  She does use a walker or cane at baseline.  Patient only complains of some left-sided facial pain as well as left shoulder pain.  Denies any numbness or tingling.  Past Medical History:  Diagnosis Date  . AKI (acute kidney injury) ([place])    due to NSAID  . Arthritis   . Constipation   . DVT (deep venous thrombosis) ([place])   . Herniated disc   . Hypertension   . Pulmonary embolism ([place])   . Seasonal allergies   . Spinal stenosis   . Stomach ulcer     due to NSAID  . Tachycardia   . UTI (lower urinary tract infection)     Past Surgical History:  Procedure Laterality Date  . COLONOSCOPY  12/21/2011   Procedure: COLONOSCOPY;  Surgeon: Wonda Horner, MD;  Location: [facility];  Service: Endoscopy;  Laterality: N/A;  . DILATION AND CURETTAGE OF UTERUS    . TONSILLECTOMY AND ADENOIDECTOMY      Family History  Problem Relation Age of Onset  . Arthritis Mother   . Heart attack Mother     Social History:  reports that she has never smoked. She has never used smokeless tobacco. She reports that she does not drink alcohol or use drugs.  Allergies:  Allergies  Allergen Reactions  . Penicillins Rash    Medications: I have reviewed the patient's current medications. Prior to Admission:  (Not in a hospital admission)  Results for orders placed or performed during the hospital encounter of 09/11/17 (from the past 48 hour(s))  CBC with Differential     Status: Abnormal   Collection Time: 09/11/17  7:18 PM  Result Value Ref Range   WBC 9.0 4.0 - 10.5 K/uL   RBC 4.01 3.87 - 5.11 MIL/uL   Hemoglobin 11.6 (L) 12.0 - 15.0 g/dL   HCT 34.9 (L) 36.0 - 46.0 %   MCV 87.0 78.0 - 100.0 fL   MCH 28.9 26.0 - 34.0 pg   MCHC 33.2 30.0 - 36.0 g/dL   RDW 14.9 11.5 - 15.5 %   Platelets 391 150 - 400 K/uL   Neutrophils Relative % 86 %   Neutro  Abs 7.8 (H) 1.7 - 7.7 K/uL   Lymphocytes Relative 7 %   Lymphs Abs 0.7 0.7 - 4.0 K/uL   Monocytes Relative 7 %   Monocytes Absolute 0.6 0.1 - 1.0 K/uL   Eosinophils Relative 0 %   Eosinophils Absolute 0.0 0.0 - 0.7 K/uL   Basophils Relative 0 %   Basophils Absolute 0.0 0.0 - 0.1 K/uL    Comment: Performed at [facility] [address], [place], [place] [address]  Basic metabolic panel     Status: Abnormal   Collection Time: 09/11/17  7:18 PM  Result Value Ref Range   Sodium 138 135 - 145 mmol/L   Potassium 4.1 3.5 - 5.1 mmol/L   Chloride 103 101 - 111 mmol/L   CO2 25 22 - 32 mmol/L    Glucose, Bld 119 (H) 65 - 99 mg/dL   BUN 22 (H) 6 - 20 mg/dL   Creatinine, Ser 1.10 (H) 0.44 - 1.00 mg/dL   Calcium 9.3 8.9 - 10.3 mg/dL   GFR calc non Af Amer 44 (L) >60 mL/min   GFR calc Af Amer 51 (L) >60 mL/min    Comment: (NOTE) The eGFR has been calculated using the CKD EPI equation. This calculation has not been validated in all clinical situations. eGFR's persistently <60 mL/min signify possible Chronic Kidney Disease.    Anion gap 10 5 - 15    Comment: Performed at [facility] [address], [place], [place] [address]  Urinalysis, Routine w reflex microscopic     Status: Abnormal   Collection Time: 09/11/17 11:21 PM  Result Value Ref Range   Color, Urine YELLOW YELLOW   APPearance CLEAR CLEAR   Specific Gravity, Urine 1.012 1.005 - 1.030   pH 7.0 5.0 - 8.0   Glucose, UA NEGATIVE NEGATIVE mg/dL   Hgb urine dipstick NEGATIVE NEGATIVE   Bilirubin Urine NEGATIVE NEGATIVE   Ketones, ur NEGATIVE NEGATIVE mg/dL   Protein, ur NEGATIVE NEGATIVE mg/dL   Nitrite NEGATIVE NEGATIVE   Leukocytes, UA SMALL (A) NEGATIVE   RBC / HPF 0-5 0 - 5 RBC/hpf   WBC, UA 6-30 0 - 5 WBC/hpf   Bacteria, UA RARE (A) NONE SEEN   Squamous Epithelial / LPF 0-5 (A) NONE SEEN   Hyaline Casts, UA PRESENT     Comment: Performed at [facility] [address] [place], [place] [address]  I-Stat CG4 Lactic Acid, ED     Status: None   Collection Time: 09/11/17 11:45 PM  Result Value Ref Range   Lactic Acid, Venous 1.47 0.5 - 1.9 mmol/L  Heparin level (unfractionated)     Status: Abnormal   Collection Time: 09/12/17  2:03 AM  Result Value Ref Range   Heparin Unfractionated >2.20 (H) 0.30 - 0.70 IU/mL    Comment: RESULTS CONFIRMED BY MANUAL DILUTION IF HEPARIN RESULTS ARE BELOW EXPECTED VALUES, AND PATIENT DOSAGE HAS BEEN CONFIRMED, SUGGEST FOLLOW UP TESTING OF ANTITHROMBIN III LEVELS. Performed at [facility], [place] [address] [place], [place] [address]   APTT     Status: None    Collection Time: 09/12/17  2:03 AM  Result Value Ref Range   aPTT 31 24 - 36 seconds    Comment: Performed at [facility] [address], [place], [place] [address]  Protime-INR     Status: Abnormal   Collection Time: 09/12/17  2:03 AM  Result Value Ref Range   Prothrombin Time 16.4 (  H) 11.4 - 15.2 seconds   INR 1.34     Comment: Performed at [facility], [place] [address], [place], [place] [address]  Basic metabolic panel     Status: Abnormal   Collection Time: 09/12/17  2:03 AM  Result Value Ref Range   Sodium 141 135 - 145 mmol/L   Potassium 4.4 3.5 - 5.1 mmol/L   Chloride 105 101 - 111 mmol/L   CO2 25 22 - 32 mmol/L   Glucose, Bld 110 (H) 65 - 99 mg/dL   BUN 14 6 - 20 mg/dL   Creatinine, Ser 1.10 (H) 0.44 - 1.00 mg/dL   Calcium 9.7 8.9 - 10.3 mg/dL   GFR calc non Af Amer 44 (L) >60 mL/min   GFR calc Af Amer 51 (L) >60 mL/min    Comment: (NOTE) The eGFR has been calculated using the CKD EPI equation. This calculation has not been validated in all clinical situations. eGFR's persistently <60 mL/min signify possible Chronic Kidney Disease.    Anion gap 11 5 - 15    Comment: Performed at [facility] [address] [place], [place] [address]  CBC     Status: Abnormal   Collection Time: 09/12/17  2:03 AM  Result Value Ref Range   WBC 7.9 4.0 - 10.5 K/uL   RBC 4.23 3.87 - 5.11 MIL/uL   Hemoglobin 12.5 12.0 - 15.0 g/dL   HCT 36.9 36.0 - 46.0 %   MCV 87.2 78.0 - 100.0 fL   MCH 29.6 26.0 - 34.0 pg   MCHC 33.9 30.0 - 36.0 g/dL   RDW 14.9 11.5 - 15.5 %   Platelets 414 (H) 150 - 400 K/uL    Comment: Performed at [facility], [place] [address] [place], [place] [address]  I-Stat CG4 Lactic Acid, ED     Status: None   Collection Time: 09/12/17  2:14 AM  Result Value Ref Range   Lactic Acid, Venous 1.28 0.5 - 1.9 mmol/L    Dg Chest 1 View  Result Date: 09/11/2017 CLINICAL DATA:  Fever EXAM: CHEST  1 VIEW COMPARISON:  CT chest 05/11/2017, radiograph  05/11/2017 FINDINGS: Inferiorly displaced left humeral head with partially visible proximal humerus fracture. No focal airspace disease or effusion. Stable enlarged cardiomediastinal silhouette with aortic atherosclerosis. No pneumothorax. IMPRESSION: 1. Cardiomegaly without acute pulmonary infiltrate 2. Partially visible fracture dislocation of the proximal left humerus Electronically Signed   By: Donavan Foil M.D.   On: 09/11/2017 22:51   Ct Head Wo Contrast  Result Date: 09/11/2017 CLINICAL DATA:  84 year old female post fall in kitchen. Denies loss of consciousness. Initial encounter. EXAM: CT HEAD WITHOUT CONTRAST CT MAXILLOFACIAL WITHOUT CONTRAST CT CERVICAL SPINE WITHOUT CONTRAST TECHNIQUE: Multidetector CT imaging of the head, cervical spine, and maxillofacial structures were performed using the standard protocol without intravenous contrast. Multiplanar CT image reconstructions of the cervical spine and maxillofacial structures were also generated. COMPARISON:  08/28/2017 head CT and cervical spine CT. FINDINGS: CT HEAD FINDINGS Brain: No intracranial hemorrhage or CT evidence of large acute infarct. Prominent chronic microvascular changes. Atrophy No intracranial mass lesion noted on this unenhanced exam. Vascular: Vascular calcifications Skull: No skull fracture Other: Prominent hematoma left frontal/supraorbital region. CT MAXILLOFACIAL FINDINGS Osseous: Nondisplaced fracture anterior and possibly posterior aspect of the left maxillary sinus. Orbits: Orbital structures appear grossly intact. Sinuses: Fluid level within the left maxillary sinus may represent result of small hemorrhage. Soft tissues: Large left frontal/supraorbital hematoma. CT CERVICAL  SPINE FINDINGS Alignment: No malalignment. Skull base and vertebrae: No cervical spine fracture. Soft tissues and spinal canal: No abnormal prevertebral soft tissue swelling. Disc levels:  Cervical spondylotic changes similar to prior exam. Upper  chest: No worrisome abnormality. Other: No worrisome abnormality. IMPRESSION: CT HEAD Prominent hematoma left frontal/supraorbital region. No skull fracture or intracranial hemorrhage. Prominent chronic microvascular changes. Atrophy CT MAXILLOFACIAL Nondisplaced fracture anterior and possibly posterior wall of the left maxillary sinus. Fluid level within the left maxillary sinus may represent result of small hemorrhage. CT CERVICAL SPINE No cervical spine fracture or malalignment. No abnormal prevertebral soft tissue swelling. Results discussed with Becky Leach physician's assistant at the time of imaging. Electronically Signed   By: Genia Del M.D.   On: 09/11/2017 18:15   Ct Cervical Spine Wo Contrast  Result Date: 09/11/2017 CLINICAL DATA:  84 year old female post fall in kitchen. Denies loss of consciousness. Initial encounter. EXAM: CT HEAD WITHOUT CONTRAST CT MAXILLOFACIAL WITHOUT CONTRAST CT CERVICAL SPINE WITHOUT CONTRAST TECHNIQUE: Multidetector CT imaging of the head, cervical spine, and maxillofacial structures were performed using the standard protocol without intravenous contrast. Multiplanar CT image reconstructions of the cervical spine and maxillofacial structures were also generated. COMPARISON:  08/28/2017 head CT and cervical spine CT. FINDINGS: CT HEAD FINDINGS Brain: No intracranial hemorrhage or CT evidence of large acute infarct. Prominent chronic microvascular changes. Atrophy No intracranial mass lesion noted on this unenhanced exam. Vascular: Vascular calcifications Skull: No skull fracture Other: Prominent hematoma left frontal/supraorbital region. CT MAXILLOFACIAL FINDINGS Osseous: Nondisplaced fracture anterior and possibly posterior aspect of the left maxillary sinus. Orbits: Orbital structures appear grossly intact. Sinuses: Fluid level within the left maxillary sinus may represent result of small hemorrhage. Soft tissues: Large left frontal/supraorbital hematoma. CT CERVICAL  SPINE FINDINGS Alignment: No malalignment. Skull base and vertebrae: No cervical spine fracture. Soft tissues and spinal canal: No abnormal prevertebral soft tissue swelling. Disc levels:  Cervical spondylotic changes similar to prior exam. Upper chest: No worrisome abnormality. Other: No worrisome abnormality. IMPRESSION: CT HEAD Prominent hematoma left frontal/supraorbital region. No skull fracture or intracranial hemorrhage. Prominent chronic microvascular changes. Atrophy CT MAXILLOFACIAL Nondisplaced fracture anterior and possibly posterior wall of the left maxillary sinus. Fluid level within the left maxillary sinus may represent result of small hemorrhage. CT CERVICAL SPINE No cervical spine fracture or malalignment. No abnormal prevertebral soft tissue swelling. Results discussed with Becky Leach physician's assistant at the time of imaging. Electronically Signed   By: Genia Del M.D.   On: 09/11/2017 18:15   Dg Shoulder Left  Result Date: 09/11/2017 CLINICAL DATA:  Post reduction EXAM: LEFT SHOULDER - 2+ VIEW COMPARISON:  09/11/2017, 08/27/2017 FINDINGS: AC joint is intact. Persistent inferior and anterior dislocation of the humeral head with respect to the glenoid fossa, increased compared to the most recent prior radiograph with the superior aspect of the humeral head positioned at the level of the inferior glenoid. Acute slightly comminuted proximal humerus fracture with close to 1 bone with of displacement of distal fracture fragment away from midline. Decreased angulation. IMPRESSION: 1. Decreased angulation of proximal humerus fracture but with residual close to 1 bone with of displacement of distal fracture fragment. 2. Persistent inferior and anterior dislocation of the humeral head with respect to the glenoid fossa, increased compared to the image performed earlier today. Electronically Signed   By: Donavan Foil M.D.   On: 09/11/2017 22:50   Dg Shoulder Left  Result Date:  09/11/2017 CLINICAL DATA:  Left shoulder pain following  a fall today. Left humeral neck fracture 2 weeks ago. EXAM: LEFT SHOULDER - 2+ VIEW COMPARISON:  08/27/2017. FINDINGS: The previously demonstrated left humeral neck fracture is again demonstrated with interval 1/2 shaft width of lateral displacement and medial angulation of the distal fragment. There is also increased overlapping of the fragments. There is also interval anterior dislocation of the humeral head relative to the glenoid. No new fractures are seen. IMPRESSION: Increased displacement and angulation of the previously demonstrated left humeral neck fracture with interval anterior dislocation of the humeral head. Electronically Signed   By: Claudie Revering M.D.   On: 09/11/2017 17:41   Dg Hip Unilat W Or Wo Pelvis 2-3 Views Left  Result Date: 09/11/2017 CLINICAL DATA:  Left hip pain following a fall in the kitchen at home today. EXAM: DG HIP (WITH OR WITHOUT PELVIS) 2-3V LEFT COMPARISON:  None. FINDINGS: Normal appearing left hip without fracture or dislocation. Lower lumbar spine degenerative changes. IMPRESSION: No hip fracture or dislocation seen. Lower lumbar spine degenerative changes. Electronically Signed   By: Claudie Revering M.D.   On: 09/11/2017 17:42   Ct Maxillofacial Wo Contrast  Result Date: 09/11/2017 CLINICAL DATA:  84 year old female post fall in kitchen. Denies loss of consciousness. Initial encounter. EXAM: CT HEAD WITHOUT CONTRAST CT MAXILLOFACIAL WITHOUT CONTRAST CT CERVICAL SPINE WITHOUT CONTRAST TECHNIQUE: Multidetector CT imaging of the head, cervical spine, and maxillofacial structures were performed using the standard protocol without intravenous contrast. Multiplanar CT image reconstructions of the cervical spine and maxillofacial structures were also generated. COMPARISON:  08/28/2017 head CT and cervical spine CT. FINDINGS: CT HEAD FINDINGS Brain: No intracranial hemorrhage or CT evidence of large acute infarct. Prominent  chronic microvascular changes. Atrophy No intracranial mass lesion noted on this unenhanced exam. Vascular: Vascular calcifications Skull: No skull fracture Other: Prominent hematoma left frontal/supraorbital region. CT MAXILLOFACIAL FINDINGS Osseous: Nondisplaced fracture anterior and possibly posterior aspect of the left maxillary sinus. Orbits: Orbital structures appear grossly intact. Sinuses: Fluid level within the left maxillary sinus may represent result of small hemorrhage. Soft tissues: Large left frontal/supraorbital hematoma. CT CERVICAL SPINE FINDINGS Alignment: No malalignment. Skull base and vertebrae: No cervical spine fracture. Soft tissues and spinal canal: No abnormal prevertebral soft tissue swelling. Disc levels:  Cervical spondylotic changes similar to prior exam. Upper chest: No worrisome abnormality. Other: No worrisome abnormality. IMPRESSION: CT HEAD Prominent hematoma left frontal/supraorbital region. No skull fracture or intracranial hemorrhage. Prominent chronic microvascular changes. Atrophy CT MAXILLOFACIAL Nondisplaced fracture anterior and possibly posterior wall of the left maxillary sinus. Fluid level within the left maxillary sinus may represent result of small hemorrhage. CT CERVICAL SPINE No cervical spine fracture or malalignment. No abnormal prevertebral soft tissue swelling. Results discussed with Becky Leach physician's assistant at the time of imaging. Electronically Signed   By: Genia Del M.D.   On: 09/11/2017 18:15    ROS  As above  Blood pressure 131/89, pulse (!) 103, temperature (!) 100.6 F (38.1 C), temperature source Rectal, resp. rate 20, height '5\' 3"'  (1.6 m), weight 47.6 kg (105 lb), SpO2 99 %. Physical Exam  Constitutional: Vital signs are normal. She is cooperative.  Older appearing black female NAD   HENT:  L sided facial swelling and ecchymosis    Cardiovascular: Normal rate and regular rhythm.  Pulmonary/Chest: Effort normal. No respiratory  distress.  Musculoskeletal:  Left upper extremity  Inspection:   Extensive ecchymosis to L shoulder   + deformity L shoulder    Ecchymosis to L  UEx   + tremor L UEx  Bony eval:   + crepitus and pain with gentle manipulation of shoulder    Elbow, forearm, wrist and hand are nontender Soft tissue:   + swelling to L shoulder, + effusion (hemarthrosis)   No elbow effusion or swelling to distal upper extremity  Sensation:   Radial, ulnar, median, axillary nv sensation grossly intact Motor:    Radial, ulnar, median, AIN, PIN appears grossly intact   Pt does have some difficulty with AIN function.  Vascular:   + radial pulse    Ext warm    Compartments are soft   No pain with passive stretch    Neurological: She is alert.  Psychiatric: She has a normal mood and affect. Her speech is normal.     Assessment/Plan:  84 y/o RHD black female with recurrent falls with displacement of known L proximal humerus fracture and dislocation of humeral head   -displaced L proximal humerus fracture and L humeral head dislocation   OR for ORIF L proximal humerus   Did discuss concern regarding blood supply to humeral head as it has been dislocated since 1500 yesterday.  Pt may develop AVN or loss of reduction and need additional surgery.  However ORIF gives her the best chance of regaining pre-injury function   Pt in agreement  She will be NWB on LUEx x 8 weeks  Shoulder pendulums for first 2 weeks post op then advancing to PROM of L shoulder with initiation of AROM around the 6-8 week mark along with resistance activities  No lifting >5 lbs x 6-8 weeks as well    - Pain management:  Scheduled tylenol   Will adjust post op   - ABL anemia/Hemodynamics  Monitor  - Medical issues   Per medical service  - DVT/PE prophylaxis:  Pt on chronic anticoagulation PTA   Currently on heparin, hold around 1100 today    Will restart eliquis post op   - ID:   periop abx   Allergy to PCN  Test dose  of ancef  - Metabolic Bone Disease:  Vitamin d pending   - FEN/GI prophylaxis/Foley/Lines:  NPO  - Dispo:  OR today for L humerus      Jari Pigg, PA-C [facility] [phone] (C) [phone] (O) 09/12/2017, 10:09 AM

## 2017-09-12 NOTE — ED Notes (Signed)
Regular breakfast tray ordered.  

## 2017-09-12 NOTE — Anesthesia Postprocedure Evaluation (Signed)
Anesthesia Post Note  Patient: Becky Leach  Procedure(s) Performed: OPEN REDUCTION INTERNAL FIXATION (ORIF) PROXIMAL  HUMERUS FRACTURE (Left )     Patient location during evaluation: PACU Anesthesia Type: General Level of consciousness: awake and alert Pain management: pain level controlled Vital Signs Assessment: post-procedure vital signs reviewed and stable Respiratory status: spontaneous breathing, nonlabored ventilation, respiratory function stable and patient connected to nasal cannula oxygen Cardiovascular status: blood pressure returned to baseline and stable Postop Assessment: no apparent nausea or vomiting Anesthetic complications: no    Last Vitals:  Vitals:   09/12/17 2015 09/12/17 2030  BP: (!) 147/96   Pulse: (!) 121 (!) 118  Resp: (!) 25 (!) 21  Temp:    SpO2: 100%     Last Pain:  Vitals:   09/12/17 2015  TempSrc:   PainSc: Asleep                 Kaelee Pfeffer,W. EDMOND

## 2017-09-12 NOTE — Anesthesia Procedure Notes (Addendum)
Central Venous Catheter Insertion Performed by: Roberts Gaudy, MD, anesthesiologist Start/End4/16/2019 4:45 PM, 09/12/2017 4:50 PM Patient location: OR. Preanesthetic checklist: patient identified, IV checked, site marked, risks and benefits discussed, surgical consent, monitors and equipment checked, pre-op evaluation, timeout performed and anesthesia consent Position: Trendelenburg Lidocaine 1% used for infiltration and patient sedated Hand hygiene performed , maximum sterile barriers used  and Seldinger technique used Catheter size: 8 Fr Total catheter length 16. Central line was placed.Double lumen Procedure performed using ultrasound guided technique. Ultrasound Notes:anatomy identified, needle tip was noted to be adjacent to the nerve/plexus identified, no ultrasound evidence of intravascular and/or intraneural injection and image(s) printed for medical record Attempts: 1 Following insertion, dressing applied, line sutured and Biopatch. Post procedure assessment: blood return through all ports  Patient tolerated the procedure well with no immediate complications.

## 2017-09-12 NOTE — H&P (Addendum)
[facility] Admission History and Physical Service Pager: [phone]  Patient name: Becky Leach record number: [id number] Date of birth: 07/24/1929 Age: 84 y.o. Gender: female  Primary Care Provider: [name], MD Consultants: Becky Leach Code Status: Full  Chief Complaint: head trauma and humeral head fracture s/p fall  Assessment and Plan: Becky Leach is a 84 y.o. female presenting with L humeral head and maxillary sinus fracture s/p fall. PMH is significant for HTN, spinal stenosis, OA, h/o PE 04/2017, CKD III.  Displaced L humeral head fracture & new L anterior shoulder dislocation after mechanical fall S/p mechanical fall 3/31 with mildly displaced fracture of the left humeral neck. [place] again on 4/15 with increased displacement of previous humeral neck fracture and new anterior dislocation of humeral head on XRAY. No intracranial hemorrhage, mass or skull fracture visualized on CT Head. No abnormalities noted on CT C spine. Becky Leach evaluated last ED visit for humeral fracture, placed splint and planned to follow up in the office. Becky Leach consulted in ED this admission who attempted reduction of displaced L humeral head in the ED without success. She is neurovascularly intact in the left arm with decreased ROM, has multiple areas of ecchymosis.  - Admit to FPTS, med surg, attending Dr. McDiarmid - Vital per floor protocol - Becky Leach consulted in ED, appreciate recs - PT/INR - Am CBC - Pain control; tramadol 50 mg q 4 PRN  - PT/OT - Will check Vit D  Facial hematoma and Maxillary sinus fracture after mechanical fall: CT Maxillofacial with fracture of L maxillary sinus with possible small hemorrhage, however no orbital fractures noted. Exam showing hematoma on left forehead, congested nasal sinuses with dried blood at nares. Left periorbital area swollen shut secondary to hematoma, eye exam is normal however.  - Touch base with ENT about the above findings,  ?anything else to do with maxillary fracture - Apply ice to facial hematoma  Fever, tachycardia Technically meeting SIRS criteria (fever, tachycardia, tachypnea) with unknown source of infection. CXR negative for signs of pulmonary infection. U/A notable for small leuks and rare bacteria although denies UTI symptoms. Lactic acid within normal limits. Could be due to inflammatory response from head and UE trauma, however will rule out infectious causes. Defer antibiotics for now as patient does not have orbital fracture, no leukocytosis, and is otherwise hemodynamically stable. - urine culture pending - am CBC - blood cultures - Continue to monitor vitals  Normocytic Anemia Hb 11.6 on admission, baseline ~12-13. Likely in the setting of acute fracture and inflammation. Recheck in the am, can consider iron panel in the future. - am CBC  HTN Normotensive on admission (122/89). Previously on antihypertensives in the past but states her PCP discontinued use about 1 year ago. - monitor  CKD III Cr on admission 1.10 which appears to be around baseline. - am BMP  OA in Knee Ambulates with walker and/or cane at baseline.   H/o PE on eliquis In 04/2017, currently on 6 month course of Eliquis. Sinus tachycardia noted on admission however without SOB, chest pain. - hold eliquis, start heparin drip as patient may need to go to surgery  Seasonal allergies On Claritin at home - continue home med  Social: Lives with her son Becky Leach. Denies having a HCPOA or living will. Son manages her finances per her report. - Will need to help patient get HCPOA and living will in order, will consult spiritual care  FEN/GI: NPO, SLIV Prophylaxis: heparin  Disposition:  admit to med-surg, attending Dr. McDiarmid  History of Present Illness:  Becky Leach is a 84 y.o. female presenting with a mechanical fall  Patient originally had a mechanical fall on 3/31, she fractured her left  shoulder at that  time.  Patient went to get some ice cream from the fridge today when she had another fall. Patient notes that she feels like her legs may have given out on her while walking. She walks with a walker and a cane at baseline. When she was walking to the fridge she was using her cane, she fell forward and on her left side. She did hit her head and face. Denies any presyncopal or syncopal symptoms prior to falling. Did not lose consciousness.   She does endorse urinating more, no dysuria, flank pain. Denies any cough, palpitations, lightheadedness, shortness of breath, chest pain, abdominal pain, N/V/D, constipation. No new rashes or wounds.   She lives with her son, Becky Leach. Ambulates with a cane or walker. She needs help bathing and dressing herself, she has an aide that helps her with this. Her son prepares most of her meals. She is able to feed herself. Does not drive and her son manages her money for her. She does not have a living will or HCPOA.   Review Of Systems: Per HPI with the following additions:   Review of Systems  Constitutional: Positive for fever. Negative for chills and weight loss.  HENT: Positive for congestion.   Eyes: Negative for blurred vision.  Respiratory: Negative for cough and shortness of breath.   Cardiovascular: Negative for chest pain.  Gastrointestinal: Negative for abdominal pain, nausea and vomiting.  Genitourinary: Negative for dysuria and urgency.  Musculoskeletal: Positive for joint pain.       L shoulder/arm pain  Neurological: Negative for seizures and loss of consciousness.    Patient Active Problem List   Diagnosis Date Noted  . Thrombocytopenia ([place]) 05/25/2017  . Pulmonary embolism ([place]) 05/11/2017  . Need for immunization against influenza 03/18/2017  . Rhinorrhea 03/18/2017  . Impaired ambulation 01/12/2017  . Unilateral primary osteoarthritis, left knee 12/26/2016  . Unilateral primary osteoarthritis, right knee 12/26/2016  . Urge urinary  incontinence 11/19/2014  . Pain of left lower extremity 08/28/2014  . Allergic rhinitis 02/04/2014  . Hot flashes 12/12/2013  . Spinal stenosis of lumbar region 11/14/2013  . Post-menopausal bleeding 12/19/2011  . Diarrhea 12/17/2011  . Benign essential HTN 12/06/2011    Past Medical History: Past Medical History:  Diagnosis Date  . AKI (acute kidney injury) ([place])    due to NSAID  . Arthritis   . Constipation   . DVT (deep venous thrombosis) ([place])   . Herniated disc   . Hypertension   . Pulmonary embolism ([place])   . Seasonal allergies   . Spinal stenosis   . Stomach ulcer    due to NSAID  . Tachycardia   . UTI (lower urinary tract infection)     Past Surgical History: Past Surgical History:  Procedure Laterality Date  . COLONOSCOPY  12/21/2011   Procedure: COLONOSCOPY;  Surgeon: Wonda Horner, MD;  Location: [facility] ENDOSCOPY;  Service: Endoscopy;  Laterality: N/A;  . DILATION AND CURETTAGE OF UTERUS    . TONSILLECTOMY AND ADENOIDECTOMY      Social History: Social History   Tobacco Use  . Smoking status: Never Smoker  . Smokeless tobacco: Never Used  Substance Use Topics  . Alcohol use: No  . Drug use: No  Additional social history: lives with son, no alcohol, tobacco, or illicit drug use  Please also refer to relevant sections of EMR.  Family History: Family History  Problem Relation Age of Onset  . Arthritis Mother   . Heart attack Mother    Allergies and Medications: Allergies  Allergen Reactions  . Penicillins Rash   No current facility-administered medications on file prior to encounter.    Current Outpatient Medications on File Prior to Encounter  Medication Sig Dispense Refill  . acetaminophen (TYLENOL) 325 MG tablet Take 2 tablets (650 mg total) by mouth every 6 (six) hours as needed for pain. 30 tablet 0  . apixaban (ELIQUIS) 5 MG TABS tablet Take 1 tablet (5mg  total) twice daily. 60 tablet 5  . diclofenac sodium (VOLTAREN) 1 % GEL Apply 2 g  topically 4 (four) times daily as needed. (Patient taking differently: Apply 2 g topically 4 (four) times daily as needed (pain). ) 300 g 5  . loratadine (CLARITIN) 10 MG tablet Take 1 tablet (10 mg total) by mouth daily. 30 tablet 11  . traMADol (ULTRAM) 50 MG tablet Take 0.5 tablets (25 mg total) by mouth every 6 (six) hours as needed for severe pain. 6 tablet 0    Objective: BP (!) 138/95   Pulse (!) 103   Temp (!) 100.6 F (38.1 C) (Rectal)   Resp 18   Ht 5\' 3"  (1.6 m)   Wt 105 lb (47.6 kg)   SpO2 99%   BMI 18.60 kg/m  Exam: General: pleasant female lying in bed, in NAD.  Eyes: PERRL, EOMI intact, normal conjunctiva. No hyphema. No hypopyon. No proptosis.   Head: large hematoma on left forehead, swelling and bruising noted to L periorbital area ENTM: MMM, dried blood at nares with nasal congestion bilaterally Cardiovascular: tacycardic, regular rhythm, no murmurs/rubs/gallops Respiratory: CTAB, normal WOB on RA Gastrointestinal: soft, +BS, NTND MSK: moves extremities spontaneously. ROM of LUE limited by pain and fracture. No edema. Derm: Multiple areas of echymosis on left arm and left upper face. Hemostatic wound on left upper lip.  Neuro: alert and oriented, speech normal. Optic field normal. PERRL, Extraocular movements intact. Intact symmetric sensation to light touch of extremities bilaterally. Hearing grossly intact bilaterally.  Tongue protrudes normally with no deviation.   Psych: mood and affect appropriate     Labs and Imaging: CBC BMET  Recent Labs  Lab 09/11/17 1918  WBC 9.0  HGB 11.6*  HCT 34.9*  PLT 391   Recent Labs  Lab 09/11/17 1918  NA 138  K 4.1  CL 103  CO2 25  BUN 22*  CREATININE 1.10*  GLUCOSE 119*  CALCIUM 9.3     Dg Chest 1 View  Result Date: 09/11/2017 CLINICAL DATA:  Fever EXAM: CHEST  1 VIEW COMPARISON:  CT chest 05/11/2017, radiograph 05/11/2017 FINDINGS: Inferiorly displaced left humeral head with partially visible proximal  humerus fracture. No focal airspace disease or effusion. Stable enlarged cardiomediastinal silhouette with aortic atherosclerosis. No pneumothorax. IMPRESSION: 1. Cardiomegaly without acute pulmonary infiltrate 2. Partially visible fracture dislocation of the proximal left humerus Electronically Signed   By: Donavan Foil M.D.   On: 09/11/2017 22:51   Ct Head Wo Contrast  Result Date: 09/11/2017 CLINICAL DATA:  84 year old female post fall in kitchen. Denies loss of consciousness. Initial encounter. EXAM: CT HEAD WITHOUT CONTRAST CT MAXILLOFACIAL WITHOUT CONTRAST CT CERVICAL SPINE WITHOUT CONTRAST TECHNIQUE: Multidetector CT imaging of the head, cervical spine, and maxillofacial structures were performed using the standard  protocol without intravenous contrast. Multiplanar CT image reconstructions of the cervical spine and maxillofacial structures were also generated. COMPARISON:  08/28/2017 head CT and cervical spine CT. FINDINGS: CT HEAD FINDINGS Brain: No intracranial hemorrhage or CT evidence of large acute infarct. Prominent chronic microvascular changes. Atrophy No intracranial mass lesion noted on this unenhanced exam. Vascular: Vascular calcifications Skull: No skull fracture Other: Prominent hematoma left frontal/supraorbital region. CT MAXILLOFACIAL FINDINGS Osseous: Nondisplaced fracture anterior and possibly posterior aspect of the left maxillary sinus. Orbits: Orbital structures appear grossly intact. Sinuses: Fluid level within the left maxillary sinus may represent result of small hemorrhage. Soft tissues: Large left frontal/supraorbital hematoma. CT CERVICAL SPINE FINDINGS Alignment: No malalignment. Skull base and vertebrae: No cervical spine fracture. Soft tissues and spinal canal: No abnormal prevertebral soft tissue swelling. Disc levels:  Cervical spondylotic changes similar to prior exam. Upper chest: No worrisome abnormality. Other: No worrisome abnormality. IMPRESSION: CT HEAD Prominent  hematoma left frontal/supraorbital region. No skull fracture or intracranial hemorrhage. Prominent chronic microvascular changes. Atrophy CT MAXILLOFACIAL Nondisplaced fracture anterior and possibly posterior wall of the left maxillary sinus. Fluid level within the left maxillary sinus may represent result of small hemorrhage. CT CERVICAL SPINE No cervical spine fracture or malalignment. No abnormal prevertebral soft tissue swelling. Results discussed with Roselyn Reef Ward physician's assistant at the time of imaging. Electronically Signed   By: Genia Del M.D.   On: 09/11/2017 18:15   Ct Cervical Spine Wo Contrast  Result Date: 09/11/2017 CLINICAL DATA:  84 year old female post fall in kitchen. Denies loss of consciousness. Initial encounter. EXAM: CT HEAD WITHOUT CONTRAST CT MAXILLOFACIAL WITHOUT CONTRAST CT CERVICAL SPINE WITHOUT CONTRAST TECHNIQUE: Multidetector CT imaging of the head, cervical spine, and maxillofacial structures were performed using the standard protocol without intravenous contrast. Multiplanar CT image reconstructions of the cervical spine and maxillofacial structures were also generated. COMPARISON:  08/28/2017 head CT and cervical spine CT. FINDINGS: CT HEAD FINDINGS Brain: No intracranial hemorrhage or CT evidence of large acute infarct. Prominent chronic microvascular changes. Atrophy No intracranial mass lesion noted on this unenhanced exam. Vascular: Vascular calcifications Skull: No skull fracture Other: Prominent hematoma left frontal/supraorbital region. CT MAXILLOFACIAL FINDINGS Osseous: Nondisplaced fracture anterior and possibly posterior aspect of the left maxillary sinus. Orbits: Orbital structures appear grossly intact. Sinuses: Fluid level within the left maxillary sinus may represent result of small hemorrhage. Soft tissues: Large left frontal/supraorbital hematoma. CT CERVICAL SPINE FINDINGS Alignment: No malalignment. Skull base and vertebrae: No cervical spine fracture.  Soft tissues and spinal canal: No abnormal prevertebral soft tissue swelling. Disc levels:  Cervical spondylotic changes similar to prior exam. Upper chest: No worrisome abnormality. Other: No worrisome abnormality. IMPRESSION: CT HEAD Prominent hematoma left frontal/supraorbital region. No skull fracture or intracranial hemorrhage. Prominent chronic microvascular changes. Atrophy CT MAXILLOFACIAL Nondisplaced fracture anterior and possibly posterior wall of the left maxillary sinus. Fluid level within the left maxillary sinus may represent result of small hemorrhage. CT CERVICAL SPINE No cervical spine fracture or malalignment. No abnormal prevertebral soft tissue swelling. Results discussed with Roselyn Reef Ward physician's assistant at the time of imaging. Electronically Signed   By: Genia Del M.D.   On: 09/11/2017 18:15   Dg Shoulder Left  Result Date: 09/11/2017 CLINICAL DATA:  Post reduction EXAM: LEFT SHOULDER - 2+ VIEW COMPARISON:  09/11/2017, 08/27/2017 FINDINGS: AC joint is intact. Persistent inferior and anterior dislocation of the humeral head with respect to the glenoid fossa, increased compared to the most recent prior radiograph with the superior  aspect of the humeral head positioned at the level of the inferior glenoid. Acute slightly comminuted proximal humerus fracture with close to 1 bone with of displacement of distal fracture fragment away from midline. Decreased angulation. IMPRESSION: 1. Decreased angulation of proximal humerus fracture but with residual close to 1 bone with of displacement of distal fracture fragment. 2. Persistent inferior and anterior dislocation of the humeral head with respect to the glenoid fossa, increased compared to the image performed earlier today. Electronically Signed   By: Donavan Foil M.D.   On: 09/11/2017 22:50   Dg Shoulder Left  Result Date: 09/11/2017 CLINICAL DATA:  Left shoulder pain following a fall today. Left humeral neck fracture 2 weeks ago.  EXAM: LEFT SHOULDER - 2+ VIEW COMPARISON:  08/27/2017. FINDINGS: The previously demonstrated left humeral neck fracture is again demonstrated with interval 1/2 shaft width of lateral displacement and medial angulation of the distal fragment. There is also increased overlapping of the fragments. There is also interval anterior dislocation of the humeral head relative to the glenoid. No new fractures are seen. IMPRESSION: Increased displacement and angulation of the previously demonstrated left humeral neck fracture with interval anterior dislocation of the humeral head. Electronically Signed   By: Claudie Revering M.D.   On: 09/11/2017 17:41   Dg Hip Unilat W Or Wo Pelvis 2-3 Views Left  Result Date: 09/11/2017 CLINICAL DATA:  Left hip pain following a fall in the kitchen at home today. EXAM: DG HIP (WITH OR WITHOUT PELVIS) 2-3V LEFT COMPARISON:  None. FINDINGS: Normal appearing left hip without fracture or dislocation. Lower lumbar spine degenerative changes. IMPRESSION: No hip fracture or dislocation seen. Lower lumbar spine degenerative changes. Electronically Signed   By: Claudie Revering M.D.   On: 09/11/2017 17:42   Ct Maxillofacial Wo Contrast  Result Date: 09/11/2017 CLINICAL DATA:  84 year old female post fall in kitchen. Denies loss of consciousness. Initial encounter. EXAM: CT HEAD WITHOUT CONTRAST CT MAXILLOFACIAL WITHOUT CONTRAST CT CERVICAL SPINE WITHOUT CONTRAST TECHNIQUE: Multidetector CT imaging of the head, cervical spine, and maxillofacial structures were performed using the standard protocol without intravenous contrast. Multiplanar CT image reconstructions of the cervical spine and maxillofacial structures were also generated. COMPARISON:  08/28/2017 head CT and cervical spine CT. FINDINGS: CT HEAD FINDINGS Brain: No intracranial hemorrhage or CT evidence of large acute infarct. Prominent chronic microvascular changes. Atrophy No intracranial mass lesion noted on this unenhanced exam. Vascular:  Vascular calcifications Skull: No skull fracture Other: Prominent hematoma left frontal/supraorbital region. CT MAXILLOFACIAL FINDINGS Osseous: Nondisplaced fracture anterior and possibly posterior aspect of the left maxillary sinus. Orbits: Orbital structures appear grossly intact. Sinuses: Fluid level within the left maxillary sinus may represent result of small hemorrhage. Soft tissues: Large left frontal/supraorbital hematoma. CT CERVICAL SPINE FINDINGS Alignment: No malalignment. Skull base and vertebrae: No cervical spine fracture. Soft tissues and spinal canal: No abnormal prevertebral soft tissue swelling. Disc levels:  Cervical spondylotic changes similar to prior exam. Upper chest: No worrisome abnormality. Other: No worrisome abnormality. IMPRESSION: CT HEAD Prominent hematoma left frontal/supraorbital region. No skull fracture or intracranial hemorrhage. Prominent chronic microvascular changes. Atrophy CT MAXILLOFACIAL Nondisplaced fracture anterior and possibly posterior wall of the left maxillary sinus. Fluid level within the left maxillary sinus may represent result of small hemorrhage. CT CERVICAL SPINE No cervical spine fracture or malalignment. No abnormal prevertebral soft tissue swelling. Results discussed with Roselyn Reef Ward physician's assistant at the time of imaging. Electronically Signed   By: Alcide Evener.D.  On: 09/11/2017 18:15   Rory Percy, DO 09/12/2017, 12:24 AM PGY-1, [place] [facility] pager: [phone], text pages welcome  I have separately seen and examined the patient. I have discussed the findings and exam with Dr. Ky Barban and agree with the above note.  My changes/additions are outlined in BLUE.   Smitty Cords, MD [place], PGY-3

## 2017-09-12 NOTE — ED Notes (Signed)
Per MD handy stop pt heprin at 11am for surgery later today.

## 2017-09-12 NOTE — ED Notes (Signed)
Son's  [name] [phone] [name] [phone]

## 2017-09-12 NOTE — Transfer of Care (Signed)
Immediate Anesthesia Transfer of Care Note  Patient: [name]  Procedure(s) Performed: OPEN REDUCTION INTERNAL FIXATION (ORIF) PROXIMAL  HUMERUS FRACTURE (Left )  Patient Location: PACU  Anesthesia Type:General  Level of Consciousness: awake, alert  and oriented  Airway & Oxygen Therapy: Patient Spontanous Breathing and Patient connected to face mask oxygen  Post-op Assessment: Report given to RN and Post -op Vital signs reviewed and stable  Post vital signs: Reviewed and stable  Last Vitals:  Vitals Value Taken Time  BP    Temp    Pulse 41 09/12/2017  7:25 PM  Resp 21 09/12/2017  7:26 PM  SpO2 79 % 09/12/2017  7:25 PM  Vitals shown include unvalidated device data.  Last Pain:  Vitals:   09/12/17 1318  TempSrc: Oral  PainSc:          Complications: No apparent anesthesia complications

## 2017-09-12 NOTE — Discharge Summary (Addendum)
[facility] Discharge Summary  Patient name: Becky Leach record number: [id number] Date of birth: 04/20/1930 Age: 84 y.o. Gender: female Date of Admission: 09/11/2017  Date of Discharge: 09/14/17 Admitting Physician: Blane Ohara McDiarmid, MD  Primary Care Provider: [name], MD Consultants: orthopedic surgery  Indication for Hospitalization: L humeral head fracture and L maxillary sinus fracture s/p fall  Discharge Diagnoses/Problem List:  Displaced L humeral head fracture (s/p ORIF on 4/16) L maxillary sinus fracture Frequent falls Normocytic anemia HTN CKD III OA in knees History of PE in 12/18, on Eliquis  Disposition: home  Discharge Condition: stable, improved  Discharge Exam:  General: elderly female lying in bed comfortably HEENT: swelling and ecchymosis significantly improved from admission, EOMI intact Cardiovascular: RRR. No MRG Respiratory: CTAB, normal WOB on RA Abdomen: soft, nontender Extremities: L arm in sling  Brief Hospital Course:  Ms. Becky Leach was admitted on 4/15 after falling at home, causing further displacement of a previous L humerus fracture and fracture of her L maxillary sinus.  Her maxillary sinus fracture was managed conservatively with ice to reduce swelling and no antibiotics since fracture was closed and did not involve the orbit.  Orthopedics examined patient's humerus and performed ORIF on 4/16.  Patient recovered well from surgery, and her L arm was placed in a sling, with orthopedic recommendations to use the sling when mobilizing her arm and to be non weightbearing for six weeks.  Patient's swelling and ecchymosis surrounding L maxilla steadily improved.  Physical therapy and occupational therapy recommended home health PT and OT as well as a home health aide.  Since patient has four sons and no HCPOA, spiritual care was consulted to discuss choosing a HCPOA.  Papers were given but unsure if they were  completed.  The patient was discharged on 4/18 after continued improvement.  Issues for Follow Up:  1. Patient may benefit from osteoporosis workup and addition of bisphosphonates, Vitamin D, and Ca if found to be positive.  Last DEXA scan was done in 2010. 2. May benefit from [facility] - received papers for this in hospital, but no documentation that they were completed. 3. Patient's sons are worried about her safety when one of them cannot be with her and are requesting additional help at home.  Forms have been left in Dr. Latina Craver box.  Significant Procedures: ORIF of L humerus  Significant Labs and Imaging:  Recent Labs  Lab 09/13/17 0441 09/14/17 0404 09/14/17 1522  WBC 8.8 7.5 7.3  HGB 10.6* 9.5* 10.4*  HCT 31.7* 28.4* 31.7*  PLT 364 313 365   Recent Labs  Lab 09/11/17 1918 09/12/17 0203  NA 138 141  K 4.1 4.4  CL 103 105  CO2 25 25  GLUCOSE 119* 110*  BUN 22* 14  CREATININE 1.10* 1.10*  CALCIUM 9.3 9.7    Dg Chest 1 View  Result Date: 09/11/2017 CLINICAL DATA:  Fever EXAM: CHEST  1 VIEW COMPARISON:  CT chest 05/11/2017, radiograph 05/11/2017 FINDINGS: Inferiorly displaced left humeral head with partially visible proximal humerus fracture. No focal airspace disease or effusion. Stable enlarged cardiomediastinal silhouette with aortic atherosclerosis. No pneumothorax. IMPRESSION: 1. Cardiomegaly without acute pulmonary infiltrate 2. Partially visible fracture dislocation of the proximal left humerus Electronically Signed   By: Donavan Foil M.D.   On: 09/11/2017 22:51   Ct Head Wo Contrast  Result Date: 09/11/2017 CLINICAL DATA:  84 year old female post fall in kitchen. Denies loss of consciousness. Initial encounter. EXAM: CT HEAD  WITHOUT CONTRAST CT MAXILLOFACIAL WITHOUT CONTRAST CT CERVICAL SPINE WITHOUT CONTRAST TECHNIQUE: Multidetector CT imaging of the head, cervical spine, and maxillofacial structures were performed using the standard protocol without intravenous  contrast. Multiplanar CT image reconstructions of the cervical spine and maxillofacial structures were also generated. COMPARISON:  08/28/2017 head CT and cervical spine CT. FINDINGS: CT HEAD FINDINGS Brain: No intracranial hemorrhage or CT evidence of large acute infarct. Prominent chronic microvascular changes. Atrophy No intracranial mass lesion noted on this unenhanced exam. Vascular: Vascular calcifications Skull: No skull fracture Other: Prominent hematoma left frontal/supraorbital region. CT MAXILLOFACIAL FINDINGS Osseous: Nondisplaced fracture anterior and possibly posterior aspect of the left maxillary sinus. Orbits: Orbital structures appear grossly intact. Sinuses: Fluid level within the left maxillary sinus may represent result of small hemorrhage. Soft tissues: Large left frontal/supraorbital hematoma. CT CERVICAL SPINE FINDINGS Alignment: No malalignment. Skull base and vertebrae: No cervical spine fracture. Soft tissues and spinal canal: No abnormal prevertebral soft tissue swelling. Disc levels:  Cervical spondylotic changes similar to prior exam. Upper chest: No worrisome abnormality. Other: No worrisome abnormality. IMPRESSION: CT HEAD Prominent hematoma left frontal/supraorbital region. No skull fracture or intracranial hemorrhage. Prominent chronic microvascular changes. Atrophy CT MAXILLOFACIAL Nondisplaced fracture anterior and possibly posterior wall of the left maxillary sinus. Fluid level within the left maxillary sinus may represent result of small hemorrhage. CT CERVICAL SPINE No cervical spine fracture or malalignment. No abnormal prevertebral soft tissue swelling. Results discussed with Roselyn Reef Ward physician's assistant at the time of imaging. Electronically Signed   By: Genia Del M.D.   On: 09/11/2017 18:15   Ct Head Wo Contrast  Result Date: 08/28/2017 CLINICAL DATA:  Mechanical fall hitting head. Patient on blood thinners. EXAM: CT HEAD WITHOUT CONTRAST CT CERVICAL SPINE WITHOUT  CONTRAST TECHNIQUE: Multidetector CT imaging of the head and cervical spine was performed following the standard protocol without intravenous contrast. Multiplanar CT image reconstructions of the cervical spine were also generated. COMPARISON:  None. FINDINGS: CT HEAD FINDINGS Brain: Mild-to-moderate small vessel ischemia in the centra semiovale and periventricular white matter. No acute intracranial hemorrhage, midline shift or edema. No large vascular territory infarct. No intra-axial mass nor extra-axial fluid collections. Vascular: No hyperdense vessel sign. Skull: No skull fracture. Sinuses/Orbits: Mild ethmoid and frontal sinus mucosal thickening. No air-fluid levels. Clear mastoids. Intact orbits and globes. Small skin lesion of left eyelid. Other: None CT CERVICAL SPINE FINDINGS Alignment: Accentuated cervical lordosis. Intact atlantodental interval craniocervical relationship. Skull base and vertebrae: No acute fracture nor suspicious osseous lesions. Soft tissues and spinal canal: No prevertebral fluid or swelling. No visible canal hematoma. Disc levels: No central canal or significant neural foraminal encroachment. Multilevel degenerative facet arthropathy with joint space narrowing and hypertrophy. Mild central disc bulge at C3-4 and C4-5 Upper chest: Negative. Other: None IMPRESSION: 1. No acute intracranial abnormality. Chronic appearing small vessel ischemic disease periventricular and deep white matter. 2. No acute cervical spine fracture or posttraumatic listhesis. Electronically Signed   By: Ashley Royalty M.D.   On: 08/28/2017 01:37   Ct Cervical Spine Wo Contrast  Result Date: 09/11/2017 CLINICAL DATA:  84 year old female post fall in kitchen. Denies loss of consciousness. Initial encounter. EXAM: CT HEAD WITHOUT CONTRAST CT MAXILLOFACIAL WITHOUT CONTRAST CT CERVICAL SPINE WITHOUT CONTRAST TECHNIQUE: Multidetector CT imaging of the head, cervical spine, and maxillofacial structures were  performed using the standard protocol without intravenous contrast. Multiplanar CT image reconstructions of the cervical spine and maxillofacial structures were also generated. COMPARISON:  08/28/2017 head CT  and cervical spine CT. FINDINGS: CT HEAD FINDINGS Brain: No intracranial hemorrhage or CT evidence of large acute infarct. Prominent chronic microvascular changes. Atrophy No intracranial mass lesion noted on this unenhanced exam. Vascular: Vascular calcifications Skull: No skull fracture Other: Prominent hematoma left frontal/supraorbital region. CT MAXILLOFACIAL FINDINGS Osseous: Nondisplaced fracture anterior and possibly posterior aspect of the left maxillary sinus. Orbits: Orbital structures appear grossly intact. Sinuses: Fluid level within the left maxillary sinus may represent result of small hemorrhage. Soft tissues: Large left frontal/supraorbital hematoma. CT CERVICAL SPINE FINDINGS Alignment: No malalignment. Skull base and vertebrae: No cervical spine fracture. Soft tissues and spinal canal: No abnormal prevertebral soft tissue swelling. Disc levels:  Cervical spondylotic changes similar to prior exam. Upper chest: No worrisome abnormality. Other: No worrisome abnormality. IMPRESSION: CT HEAD Prominent hematoma left frontal/supraorbital region. No skull fracture or intracranial hemorrhage. Prominent chronic microvascular changes. Atrophy CT MAXILLOFACIAL Nondisplaced fracture anterior and possibly posterior wall of the left maxillary sinus. Fluid level within the left maxillary sinus may represent result of small hemorrhage. CT CERVICAL SPINE No cervical spine fracture or malalignment. No abnormal prevertebral soft tissue swelling. Results discussed with Roselyn Reef Ward physician's assistant at the time of imaging. Electronically Signed   By: Genia Del M.D.   On: 09/11/2017 18:15   Ct Cervical Spine Wo Contrast  Result Date: 08/28/2017 CLINICAL DATA:  Mechanical fall hitting head. Patient on  blood thinners. EXAM: CT HEAD WITHOUT CONTRAST CT CERVICAL SPINE WITHOUT CONTRAST TECHNIQUE: Multidetector CT imaging of the head and cervical spine was performed following the standard protocol without intravenous contrast. Multiplanar CT image reconstructions of the cervical spine were also generated. COMPARISON:  None. FINDINGS: CT HEAD FINDINGS Brain: Mild-to-moderate small vessel ischemia in the centra semiovale and periventricular white matter. No acute intracranial hemorrhage, midline shift or edema. No large vascular territory infarct. No intra-axial mass nor extra-axial fluid collections. Vascular: No hyperdense vessel sign. Skull: No skull fracture. Sinuses/Orbits: Mild ethmoid and frontal sinus mucosal thickening. No air-fluid levels. Clear mastoids. Intact orbits and globes. Small skin lesion of left eyelid. Other: None CT CERVICAL SPINE FINDINGS Alignment: Accentuated cervical lordosis. Intact atlantodental interval craniocervical relationship. Skull base and vertebrae: No acute fracture nor suspicious osseous lesions. Soft tissues and spinal canal: No prevertebral fluid or swelling. No visible canal hematoma. Disc levels: No central canal or significant neural foraminal encroachment. Multilevel degenerative facet arthropathy with joint space narrowing and hypertrophy. Mild central disc bulge at C3-4 and C4-5 Upper chest: Negative. Other: None IMPRESSION: 1. No acute intracranial abnormality. Chronic appearing small vessel ischemic disease periventricular and deep white matter. 2. No acute cervical spine fracture or posttraumatic listhesis. Electronically Signed   By: Ashley Royalty M.D.   On: 08/28/2017 01:37   Dg Shoulder Left  Result Date: 09/12/2017 CLINICAL DATA:  Fracture EXAM: LEFT SHOULDER - 2+ VIEW COMPARISON:  09/11/2017 FINDINGS: Fracture dislocation again noted of the left humerus. Displaced left humeral neck fracture again noted, unchanged. Anterior/inferior dislocation of the humeral  head. IMPRESSION: No significant change in the displaced left humeral neck fracture or dislocation. Electronically Signed   By: Rolm Baptise M.D.   On: 09/12/2017 10:39   Dg Shoulder Left  Result Date: 09/11/2017 CLINICAL DATA:  Post reduction EXAM: LEFT SHOULDER - 2+ VIEW COMPARISON:  09/11/2017, 08/27/2017 FINDINGS: AC joint is intact. Persistent inferior and anterior dislocation of the humeral head with respect to the glenoid fossa, increased compared to the most recent prior radiograph with the superior aspect of the humeral  head positioned at the level of the inferior glenoid. Acute slightly comminuted proximal humerus fracture with close to 1 bone with of displacement of distal fracture fragment away from midline. Decreased angulation. IMPRESSION: 1. Decreased angulation of proximal humerus fracture but with residual close to 1 bone with of displacement of distal fracture fragment. 2. Persistent inferior and anterior dislocation of the humeral head with respect to the glenoid fossa, increased compared to the image performed earlier today. Electronically Signed   By: Donavan Foil M.D.   On: 09/11/2017 22:50   Dg Shoulder Left  Result Date: 09/11/2017 CLINICAL DATA:  Left shoulder pain following a fall today. Left humeral neck fracture 2 weeks ago. EXAM: LEFT SHOULDER - 2+ VIEW COMPARISON:  08/27/2017. FINDINGS: The previously demonstrated left humeral neck fracture is again demonstrated with interval 1/2 shaft width of lateral displacement and medial angulation of the distal fragment. There is also increased overlapping of the fragments. There is also interval anterior dislocation of the humeral head relative to the glenoid. No new fractures are seen. IMPRESSION: Increased displacement and angulation of the previously demonstrated left humeral neck fracture with interval anterior dislocation of the humeral head. Electronically Signed   By: Claudie Revering M.D.   On: 09/11/2017 17:41   Dg Shoulder  Left  Result Date: 08/27/2017 CLINICAL DATA:  84 year old female with fall and left shoulder pain. EXAM: LEFT SHOULDER - 2+ VIEW COMPARISON:  None. FINDINGS: There is a mildly displaced oblique fracture of the left humeral neck. The humeral head remains in anatomic alignment with the glenoid. The bones are osteopenic. There is degenerative changes of the left shoulder. The soft tissues appear unremarkable. IMPRESSION: Mildly displaced fracture of the left humeral neck.  No dislocation. Electronically Signed   By: Anner Crete M.D.   On: 08/27/2017 23:26   Dg Shoulder Left Port  Result Date: 09/13/2017 CLINICAL DATA:  Evaluate surgical pair of the proximal left humerus. EXAM: LEFT SHOULDER - 1 VIEW COMPARISON:  09/12/2017 FINDINGS: Plate and screw fixation of the displaced proximal humeral fracture. Left shoulder is located. Degenerative changes at the left [facility] joint. Normal alignment of left AC joint. IMPRESSION: Surgical fixation of the displaced left proximal humeral fracture. Electronically Signed   By: Markus Daft M.D.   On: 09/13/2017 09:37   Dg Shoulder Left Port  Result Date: 09/12/2017 CLINICAL DATA:  Status post internal fixation of left humeral fracture. EXAM: LEFT SHOULDER - 1 VIEW COMPARISON:  Left shoulder radiographs performed earlier today at 10:16 a.m. FINDINGS: The patient is status post internal fixation of the comminuted left humeral fracture, with mild residual displacement. The left humeral head remains seated at the glenoid fossa. Minimal degenerative change is noted at the acromioclavicular joint. The visualized portions of the left lung are clear. Soft tissue swelling is noted about the proximal left arm. IMPRESSION: Status post internal fixation of comminuted left humeral fracture, with mild residual displacement. Electronically Signed   By: Garald Balding M.D.   On: 09/12/2017 21:47   Dg Humerus Left  Result Date: 09/12/2017 CLINICAL DATA:  Left shoulder fracture EXAM: LEFT  HUMERUS - 2+ VIEW; DG C-ARM 61-120 MIN COMPARISON:  09/12/2017 FINDINGS: Eleven low resolution intraoperative spot views of the left humerus. Total fluoroscopy time was 45 seconds. Images were obtained during operative course of surgical plate and multiple screw fixation of proximal humerus fracture. There is decreased angulation and displacement. There are displaced fracture fragments noted both anterior and posterior on the final image. IMPRESSION: Intraoperative  fluoroscopic assistance provided during surgical fixation of proximal left humerus fracture Electronically Signed   By: Donavan Foil M.D.   On: 09/12/2017 21:11   Dg C-arm 1-60 Min  Result Date: 09/12/2017 CLINICAL DATA:  Left shoulder fracture EXAM: LEFT HUMERUS - 2+ VIEW; DG C-ARM 61-120 MIN COMPARISON:  09/12/2017 FINDINGS: Eleven low resolution intraoperative spot views of the left humerus. Total fluoroscopy time was 45 seconds. Images were obtained during operative course of surgical plate and multiple screw fixation of proximal humerus fracture. There is decreased angulation and displacement. There are displaced fracture fragments noted both anterior and posterior on the final image. IMPRESSION: Intraoperative fluoroscopic assistance provided during surgical fixation of proximal left humerus fracture Electronically Signed   By: Donavan Foil M.D.   On: 09/12/2017 21:11   Dg Hip Unilat W Or Wo Pelvis 2-3 Views Left  Result Date: 09/11/2017 CLINICAL DATA:  Left hip pain following a fall in the kitchen at home today. EXAM: DG HIP (WITH OR WITHOUT PELVIS) 2-3V LEFT COMPARISON:  None. FINDINGS: Normal appearing left hip without fracture or dislocation. Lower lumbar spine degenerative changes. IMPRESSION: No hip fracture or dislocation seen. Lower lumbar spine degenerative changes. Electronically Signed   By: Claudie Revering M.D.   On: 09/11/2017 17:42   Ct Maxillofacial Wo Contrast  Result Date: 09/11/2017 CLINICAL DATA:  84 year old female  post fall in kitchen. Denies loss of consciousness. Initial encounter. EXAM: CT HEAD WITHOUT CONTRAST CT MAXILLOFACIAL WITHOUT CONTRAST CT CERVICAL SPINE WITHOUT CONTRAST TECHNIQUE: Multidetector CT imaging of the head, cervical spine, and maxillofacial structures were performed using the standard protocol without intravenous contrast. Multiplanar CT image reconstructions of the cervical spine and maxillofacial structures were also generated. COMPARISON:  08/28/2017 head CT and cervical spine CT. FINDINGS: CT HEAD FINDINGS Brain: No intracranial hemorrhage or CT evidence of large acute infarct. Prominent chronic microvascular changes. Atrophy No intracranial mass lesion noted on this unenhanced exam. Vascular: Vascular calcifications Skull: No skull fracture Other: Prominent hematoma left frontal/supraorbital region. CT MAXILLOFACIAL FINDINGS Osseous: Nondisplaced fracture anterior and possibly posterior aspect of the left maxillary sinus. Orbits: Orbital structures appear grossly intact. Sinuses: Fluid level within the left maxillary sinus may represent result of small hemorrhage. Soft tissues: Large left frontal/supraorbital hematoma. CT CERVICAL SPINE FINDINGS Alignment: No malalignment. Skull base and vertebrae: No cervical spine fracture. Soft tissues and spinal canal: No abnormal prevertebral soft tissue swelling. Disc levels:  Cervical spondylotic changes similar to prior exam. Upper chest: No worrisome abnormality. Other: No worrisome abnormality. IMPRESSION: CT HEAD Prominent hematoma left frontal/supraorbital region. No skull fracture or intracranial hemorrhage. Prominent chronic microvascular changes. Atrophy CT MAXILLOFACIAL Nondisplaced fracture anterior and possibly posterior wall of the left maxillary sinus. Fluid level within the left maxillary sinus may represent result of small hemorrhage. CT CERVICAL SPINE No cervical spine fracture or malalignment. No abnormal prevertebral soft tissue swelling.  Results discussed with Roselyn Reef Ward physician's assistant at the time of imaging. Electronically Signed   By: Genia Del M.D.   On: 09/11/2017 18:15     Results/Tests Pending at Time of Discharge: none  Discharge Medications:  Allergies as of 09/14/2017      Reactions   Penicillins Rash   Cephalosporin tolerant given during surgery 08/2017      Medication List    STOP taking these medications   diclofenac sodium 1 % Gel Commonly known as:  VOLTAREN     TAKE these medications   acetaminophen 325 MG tablet Commonly known as:  TYLENOL  Take 2 tablets (650 mg total) by mouth every 6 (six) hours as needed for pain.   apixaban 5 MG Tabs tablet Commonly known as:  ELIQUIS Take 1 tablet (5mg  total) twice daily.   Cholecalciferol 2000 units Tabs Take 1 tablet (2,000 Units total) by mouth 2 (two) times daily.   loratadine 10 MG tablet Commonly known as:  CLARITIN Take 1 tablet (10 mg total) by mouth daily.   traMADol 50 MG tablet Commonly known as:  ULTRAM Take 0.5 tablets (25 mg total) by mouth every 6 (six) hours as needed for severe pain.       Discharge Instructions: Please refer to Patient Instructions section of EMR for full details.  Patient was counseled important signs and symptoms that should prompt return to medical care, changes in medications, dietary instructions, activity restrictions, and follow up appointments.   Follow-Up Appointments: Follow-up Information    Altamese Calloway, MD. Schedule an appointment as soon as possible for a visit in 2 week(s).   Specialty:  Orthopedic Surgery Contact information: [address] [address] [place] Follow up on 09/20/2017.   Why:  @1 :40pm Contact information: [place]          Kathrene Alu, MD 09/15/2017, 10:18 PM PGY-1, [place]

## 2017-09-13 ENCOUNTER — Telehealth: Payer: Self-pay | Admitting: *Deleted

## 2017-09-13 ENCOUNTER — Encounter (HOSPITAL_COMMUNITY): Payer: Self-pay | Admitting: Orthopedic Surgery

## 2017-09-13 ENCOUNTER — Inpatient Hospital Stay (HOSPITAL_COMMUNITY): Payer: Medicare Other

## 2017-09-13 ENCOUNTER — Other Ambulatory Visit: Payer: Self-pay

## 2017-09-13 LAB — CBC
HCT: 31.7 % — ABNORMAL LOW (ref 36.0–46.0)
HEMOGLOBIN: 10.6 g/dL — AB (ref 12.0–15.0)
MCH: 29 pg (ref 26.0–34.0)
MCHC: 33.4 g/dL (ref 30.0–36.0)
MCV: 86.6 fL (ref 78.0–100.0)
Platelets: 364 10*3/uL (ref 150–400)
RBC: 3.66 MIL/uL — AB (ref 3.87–5.11)
RDW: 15 % (ref 11.5–15.5)
WBC: 8.8 10*3/uL (ref 4.0–10.5)

## 2017-09-13 LAB — URINE CULTURE

## 2017-09-13 MED ORDER — SODIUM CHLORIDE 0.9% FLUSH: 10.0000 mL | INTRAVENOUS | Status: DC | PRN

## 2017-09-13 MED ORDER — APIXABAN 5 MG PO TABS
5.0000 mg | ORAL_TABLET | Freq: Two times a day (BID) | ORAL | Status: DC
  Administered 2017-09-13 – 2017-09-14 (×3): 5 mg via ORAL
  Filled 2017-09-13 (×3): qty 1

## 2017-09-13 NOTE — Op Note (Signed)
NAME:  Becky, Leach                    ACCOUNT NO.:  MEDICAL RECORD NO.:  [id number]  LOCATION:                                 FACILITY:  PHYSICIAN:  Astrid Divine. Marcelino Scot, M.D.      DATE OF BIRTH:  DATE OF PROCEDURE:  09/12/2017 DATE OF DISCHARGE:                              OPERATIVE REPORT   PREOPERATIVE DIAGNOSES: 1. Left proximal humerus fracture. 2. Suspected left shoulder pseudo dislocation.  POSTOPERATIVE DIAGNOSES: 1. Left proximal humerus fracture. 2. Pseudodislocation, left shoulder.  PROCEDURE:  ORIF of left proximal humerus fracture.  SURGEON:  Astrid Divine. Marcelino Scot, M.D.  ASSISTANTS: 1. Ainsley Spinner, PA-C. 2. PA student.  ANESTHESIA:  General.  COMPLICATIONS:  None.  ESTIMATED BLOOD LOSS:  80 mL.  DRAINS:  None.  SPECIMENS:  None.  DISPOSITION:  To PACU.  BRIEF SUMMARY OF INDICATION FOR PROCEDURE:  Becky Leach is a very pleasant 84 year old right-hand dominant female who is a walker and cane dependent for ambulation, who sustained a left proximal humerus fracture 2 weeks ago.  The patient subsequently fell again with increased pain, increased displacement and mobility at the fracture site as well as possible dislocation.  The ER did attempt to reduce this, but was unsuccessful.  I was contacted to see the patient as my partner, who has been managing her, was out of town.  I did discuss with the patient that this was likely represented a pseudodislocation but not require a separate reduction, but that with her increased pain and displacement as well as her need to return to weightbearing through the upper extremities for ambulation that it would be reasonable to proceed with internal fixation at this time.  I discussed with her risk of heart attack, stroke, infection, malunion, nonunion, loss of motion, DVT, PE, need for further surgery, and multiple others and she did wish to proceed.  BRIEF SUMMARY OF PROCEDURE:  The patient was taken to the  operating room where general anesthesia was induced.  She did receive Ancef, which she tolerated very well in spite of a PENICILLIN allergy indicating cephalosporin tolerance.  The standard deltopectoral approach was made after time-out.  Dissection carried down to the interval where the superior portion of the pec insertion was mobilized as well as the more anterior edge of the deltoid.  I was able to mobilize the head segment. There did appear to already be a pseudoarthrosis forming with excessive clear fluid.  There was no evidence of any infection whatsoever.  There was some new and old liquified hematoma.  This was evacuated and then reduction maneuver performed while keeping the periostial attachments to all the fragments.  Pins were used and then a plate with provisional pin fixation.  C-arm brought in to confirm appropriate alignment and reduction with our primary goal being for restoration of proper head shaft orientation.  This was followed by a screw fixation into the shaft in the slotted hole and then standard fixation into the head, which worked to appose the plate to the bone, although it was not completely flushed in all areas.  The reduction of the humeral head and shaft was excellent and so  we proceeded with additional fixation at that time. Locked pegs were placed into the head, standard screws into the shaft as well as 1 lock screw as well.  Final images showed appropriate reduction, hardware trajectory, and length.  My assistant, Ainsley Spinner, was present and assisting throughout as was a second assist, who is a Counselling psychologist.  An assistant was absolutely necessary for safe and effective completion as the reduction had to be held during provisional and definitive fixation.  Lastly, some bone graft was placed along the fragments anteromedially where there were some particular comminution. The arm had outstanding rotation with a full internal at 90 degrees of abduction as  well as external following fixation.  Wound was thoroughly irrigated and then closed in a standard layered fashion using #1 Vicryl to reapproximate the superior edge of the pec and anterior edge of the delt and then 0 for reapproximation of the muscular interval, 2-0 Vicryl and nylon for the skin.  Sterile gently compressive dressing was applied and a sling with an Ace from hand to upper arm.  The patient was taken to PACU in stable condition.  PROGNOSIS:  Becky Leach will have unrestricted passive range of motion of the left shoulder at this time with active elbow, wrist, and digital motion.  We will likely resume weightbearing at 6 weeks.  She may be a candidate for earlier weightbearing with a platform walker to restrict her to axial loads, but if she can mobilize effectively without that, then we will defer.  I concerned about her fall risk.  At 57, her bone quality was actually much better than anticipated, which is positive prognostically.     Astrid Divine. Marcelino Scot, M.D.     MHH/MEDQ  D:  09/12/2017  T:  09/12/2017  Job:  [id number]

## 2017-09-13 NOTE — Care Management Note (Signed)
Case Management Note  Patient Details  Name: Becky Leach MRN: [id number] Date of Birth: 08-16-1929  Subjective/Objective:                    Action/Plan: Discuss discharge plan with patient and son at bedside. Patient from home with [place] . Family plans on taking turns staying with patient at discharge. Son asking about CAPS program . Explained PCP will have to initial paper work and can be waiting list.   PT recommended cane vs hemiwalker . Spoke with Len[place] with PT she will have a PT work with patient tomorrow using both pieces of equipment and then decide which is best. Patient and son both voiced understanding.  Expected Discharge Date:                  Expected Discharge Plan:  [place]  In-House Referral:     Discharge planning Services  CM Consult  Post Acute Care Choice:  Durable Medical Equipment, Home Health Choice offered to:  Patient, Adult Children  DME Arranged:    DME Agency:  [facility] Arranged:  OT, PT, Nurse's Aide [place] Agency:  [facility]  Status of Service:  In process, will continue to follow  If discussed at Long Length of Stay Meetings, dates discussed:    Additional Comments:  Becky Favre, RN 09/13/2017, 4:36 PM

## 2017-09-13 NOTE — Progress Notes (Signed)
   09/13/17 1700  Clinical Encounter Type  Visited With Patient  Responded to Patient for consult request, when I arrived Pt was already discharged. Matthew Folks

## 2017-09-13 NOTE — Progress Notes (Signed)
Family Medicine Teaching Service Daily Progress Note Intern Pager: [phone]  Patient name: Becky Leach Date of birth: 1930/03/10 Age: 84 y.o. Gender: female  Primary Care Provider: [name], MD Consultants: orthopedics Code Status: full  Pt Overview and Major Events to Date:  Admitted 4/16 ORIF of L humerus 4/16  Assessment and Plan:  Becky Leach is a 84 y.o. female presenting with L humeral head and maxillary sinus fracture s/p fall. PMH is significant for HTN, spinal stenosis, OA, h/o PE 04/2017, CKD III.  Displaced L humeral head fracture & new L anterior shoulder dislocation after mechanical fall Underwent ORIF of L humeral head on 4/16 after having increased pain and dislocation due to second fall on previously fractured humerus.  Will be non weight bearing for six weeks.  CBC showing decrease in Hgb from 12.5 to 10.6, consistent with intraoperative blood loss. - Vital per floor protocol - appreciate ortho recs - PT/INR - Pain control; morphine 2 mg Q2H PRN post surgery; last dose at 2300 - PT/OT - f/u Vit D level  Facial hematoma and Maxillary sinus fracture after mechanical fall: CT Maxillofacial with fracture of L maxillary sinus with possible small hemorrhage, however no orbital fractures noted. Fracture is closed.  Exam showing hematoma on left forehead, congested nasal sinuses with dried blood at nares. Left periorbital area swollen shut secondary to hematoma, eye exam is normal however.  Swelling is much improved today. - No indication for ENT consult or initiating antibiotics at this time - Apply ice to facial hematoma as needed  Normocytic Anemia Hb 11.6 on admission, baseline ~12-13. Likely in the setting of acute fracture and inflammation. Recheck in the am, can consider iron panel in the future.  Hb 10.6 on 4/17.  HTN Hypertensive overnight to 178/114, likely due to pain and post-surgery.  Last BP 111/73.  Previously on  antihypertensives in the past but states her PCP discontinued use about 1 year ago. - monitor  CKD III Cr on admission 1.10 which appears to be around baseline. - am BMP on 4/16 with creatinine 1.10  OA in Knee Ambulates with walker and/or cane at baseline.   H/o PE on eliquis In 04/2017, currently on 6 month course of Eliquis. Sinus tachycardia noted on admission however without SOB, chest pain. - will continue to hold eliquis  Seasonal allergies On Claritin at home - continue home med  Social: Lives with her son [name]. Denies having a HCPOA or living will. Son manages her finances per her report. - Will need to help patient get HCPOA and living will in order, will consult spiritual care  FEN/GI: full liquid PPx: SCDs  Disposition: not yet determined  Subjective:  Patient says her pain is moderately well controlled and has no other complaints.  Her son [name] is in the room and asked about antibiotics for her maxillary fracture, home health for ADLs when sons are not at home, whether Eliquis could have caused her falls, and what her Vitamin D level was.  Questions were answered.  Objective: Temp:  [97.5 F (36.4 C)-98.8 F (37.1 C)] 98.7 F (37.1 C) (04/17 0604) Pulse Rate:  [87-122] 93 (04/17 0604) Resp:  [16-25] 16 (04/17 0604) BP: (111-178)/(73-120) 111/73 (04/17 0604) SpO2:  [93 %-100 %] 98 % (04/17 0604) Weight:  [110 lb 1.6 oz (49.9 kg)] 110 lb 1.6 oz (49.9 kg) (04/16 2157) Physical Exam: General: lying in bed comfortably HEENT: swelling and ecchymosis significantly improved today, L eye  much more open Cardiovascular: RRR. No MRG Respiratory: CTAB Abdomen: soft, nontender Extremities: L arm in sling with ice pack in place  Laboratory: Recent Labs  Lab 09/11/17 1918 09/12/17 0203 09/13/17 0441  WBC 9.0 7.9 8.8  HGB 11.6* 12.5 10.6*  HCT 34.9* 36.9 31.7*  PLT 391 414* 364   Recent Labs  Lab 09/11/17 1918 09/12/17 0203  NA 138 141  K 4.1 4.4   CL 103 105  CO2 25 25  BUN 22* 14  CREATININE 1.10* 1.10*  CALCIUM 9.3 9.7  GLUCOSE 119* 110*    Imaging/Diagnostic Tests: Dg Chest 1 View  Result Date: 09/11/2017 CLINICAL DATA:  Fever EXAM: CHEST  1 VIEW COMPARISON:  CT chest 05/11/2017, radiograph 05/11/2017 FINDINGS: Inferiorly displaced left humeral head with partially visible proximal humerus fracture. No focal airspace disease or effusion. Stable enlarged cardiomediastinal silhouette with aortic atherosclerosis. No pneumothorax. IMPRESSION: 1. Cardiomegaly without acute pulmonary infiltrate 2. Partially visible fracture dislocation of the proximal left humerus Electronically Signed   By: Donavan Foil M.D.   On: 09/11/2017 22:51   Ct Head Wo Contrast  Result Date: 09/11/2017 CLINICAL DATA:  84 year old female post fall in kitchen. Denies loss of consciousness. Initial encounter. EXAM: CT HEAD WITHOUT CONTRAST CT MAXILLOFACIAL WITHOUT CONTRAST CT CERVICAL SPINE WITHOUT CONTRAST TECHNIQUE: Multidetector CT imaging of the head, cervical spine, and maxillofacial structures were performed using the standard protocol without intravenous contrast. Multiplanar CT image reconstructions of the cervical spine and maxillofacial structures were also generated. COMPARISON:  08/28/2017 head CT and cervical spine CT. FINDINGS: CT HEAD FINDINGS Brain: No intracranial hemorrhage or CT evidence of large acute infarct. Prominent chronic microvascular changes. Atrophy No intracranial mass lesion noted on this unenhanced exam. Vascular: Vascular calcifications Skull: No skull fracture Other: Prominent hematoma left frontal/supraorbital region. CT MAXILLOFACIAL FINDINGS Osseous: Nondisplaced fracture anterior and possibly posterior aspect of the left maxillary sinus. Orbits: Orbital structures appear grossly intact. Sinuses: Fluid level within the left maxillary sinus may represent result of small hemorrhage. Soft tissues: Large left frontal/supraorbital hematoma.  CT CERVICAL SPINE FINDINGS Alignment: No malalignment. Skull base and vertebrae: No cervical spine fracture. Soft tissues and spinal canal: No abnormal prevertebral soft tissue swelling. Disc levels:  Cervical spondylotic changes similar to prior exam. Upper chest: No worrisome abnormality. Other: No worrisome abnormality. IMPRESSION: CT HEAD Prominent hematoma left frontal/supraorbital region. No skull fracture or intracranial hemorrhage. Prominent chronic microvascular changes. Atrophy CT MAXILLOFACIAL Nondisplaced fracture anterior and possibly posterior wall of the left maxillary sinus. Fluid level within the left maxillary sinus may represent result of small hemorrhage. CT CERVICAL SPINE No cervical spine fracture or malalignment. No abnormal prevertebral soft tissue swelling. Results discussed with Roselyn Reef Ward physician's assistant at the time of imaging. Electronically Signed   By: Genia Del M.D.   On: 09/11/2017 18:15   Ct Head Wo Contrast  Result Date: 08/28/2017 CLINICAL DATA:  Mechanical fall hitting head. Patient on blood thinners. EXAM: CT HEAD WITHOUT CONTRAST CT CERVICAL SPINE WITHOUT CONTRAST TECHNIQUE: Multidetector CT imaging of the head and cervical spine was performed following the standard protocol without intravenous contrast. Multiplanar CT image reconstructions of the cervical spine were also generated. COMPARISON:  None. FINDINGS: CT HEAD FINDINGS Brain: Mild-to-moderate small vessel ischemia in the centra semiovale and periventricular white matter. No acute intracranial hemorrhage, midline shift or edema. No large vascular territory infarct. No intra-axial mass nor extra-axial fluid collections. Vascular: No hyperdense vessel sign. Skull: No skull fracture. Sinuses/Orbits: Mild ethmoid and frontal sinus  mucosal thickening. No air-fluid levels. Clear mastoids. Intact orbits and globes. Small skin lesion of left eyelid. Other: None CT CERVICAL SPINE FINDINGS Alignment: Accentuated  cervical lordosis. Intact atlantodental interval craniocervical relationship. Skull base and vertebrae: No acute fracture nor suspicious osseous lesions. Soft tissues and spinal canal: No prevertebral fluid or swelling. No visible canal hematoma. Disc levels: No central canal or significant neural foraminal encroachment. Multilevel degenerative facet arthropathy with joint space narrowing and hypertrophy. Mild central disc bulge at C3-4 and C4-5 Upper chest: Negative. Other: None IMPRESSION: 1. No acute intracranial abnormality. Chronic appearing small vessel ischemic disease periventricular and deep white matter. 2. No acute cervical spine fracture or posttraumatic listhesis. Electronically Signed   By: Ashley Royalty M.D.   On: 08/28/2017 01:37   Ct Cervical Spine Wo Contrast  Result Date: 09/11/2017 CLINICAL DATA:  84 year old female post fall in kitchen. Denies loss of consciousness. Initial encounter. EXAM: CT HEAD WITHOUT CONTRAST CT MAXILLOFACIAL WITHOUT CONTRAST CT CERVICAL SPINE WITHOUT CONTRAST TECHNIQUE: Multidetector CT imaging of the head, cervical spine, and maxillofacial structures were performed using the standard protocol without intravenous contrast. Multiplanar CT image reconstructions of the cervical spine and maxillofacial structures were also generated. COMPARISON:  08/28/2017 head CT and cervical spine CT. FINDINGS: CT HEAD FINDINGS Brain: No intracranial hemorrhage or CT evidence of large acute infarct. Prominent chronic microvascular changes. Atrophy No intracranial mass lesion noted on this unenhanced exam. Vascular: Vascular calcifications Skull: No skull fracture Other: Prominent hematoma left frontal/supraorbital region. CT MAXILLOFACIAL FINDINGS Osseous: Nondisplaced fracture anterior and possibly posterior aspect of the left maxillary sinus. Orbits: Orbital structures appear grossly intact. Sinuses: Fluid level within the left maxillary sinus may represent result of small hemorrhage.  Soft tissues: Large left frontal/supraorbital hematoma. CT CERVICAL SPINE FINDINGS Alignment: No malalignment. Skull base and vertebrae: No cervical spine fracture. Soft tissues and spinal canal: No abnormal prevertebral soft tissue swelling. Disc levels:  Cervical spondylotic changes similar to prior exam. Upper chest: No worrisome abnormality. Other: No worrisome abnormality. IMPRESSION: CT HEAD Prominent hematoma left frontal/supraorbital region. No skull fracture or intracranial hemorrhage. Prominent chronic microvascular changes. Atrophy CT MAXILLOFACIAL Nondisplaced fracture anterior and possibly posterior wall of the left maxillary sinus. Fluid level within the left maxillary sinus may represent result of small hemorrhage. CT CERVICAL SPINE No cervical spine fracture or malalignment. No abnormal prevertebral soft tissue swelling. Results discussed with Roselyn Reef Ward physician's assistant at the time of imaging. Electronically Signed   By: Genia Del M.D.   On: 09/11/2017 18:15   Ct Cervical Spine Wo Contrast  Result Date: 08/28/2017 CLINICAL DATA:  Mechanical fall hitting head. Patient on blood thinners. EXAM: CT HEAD WITHOUT CONTRAST CT CERVICAL SPINE WITHOUT CONTRAST TECHNIQUE: Multidetector CT imaging of the head and cervical spine was performed following the standard protocol without intravenous contrast. Multiplanar CT image reconstructions of the cervical spine were also generated. COMPARISON:  None. FINDINGS: CT HEAD FINDINGS Brain: Mild-to-moderate small vessel ischemia in the centra semiovale and periventricular white matter. No acute intracranial hemorrhage, midline shift or edema. No large vascular territory infarct. No intra-axial mass nor extra-axial fluid collections. Vascular: No hyperdense vessel sign. Skull: No skull fracture. Sinuses/Orbits: Mild ethmoid and frontal sinus mucosal thickening. No air-fluid levels. Clear mastoids. Intact orbits and globes. Small skin lesion of left eyelid.  Other: None CT CERVICAL SPINE FINDINGS Alignment: Accentuated cervical lordosis. Intact atlantodental interval craniocervical relationship. Skull base and vertebrae: No acute fracture nor suspicious osseous lesions. Soft tissues and spinal canal: No prevertebral  fluid or swelling. No visible canal hematoma. Disc levels: No central canal or significant neural foraminal encroachment. Multilevel degenerative facet arthropathy with joint space narrowing and hypertrophy. Mild central disc bulge at C3-4 and C4-5 Upper chest: Negative. Other: None IMPRESSION: 1. No acute intracranial abnormality. Chronic appearing small vessel ischemic disease periventricular and deep white matter. 2. No acute cervical spine fracture or posttraumatic listhesis. Electronically Signed   By: Ashley Royalty M.D.   On: 08/28/2017 01:37   Dg Shoulder Left  Result Date: 09/12/2017 CLINICAL DATA:  Fracture EXAM: LEFT SHOULDER - 2+ VIEW COMPARISON:  09/11/2017 FINDINGS: Fracture dislocation again noted of the left humerus. Displaced left humeral neck fracture again noted, unchanged. Anterior/inferior dislocation of the humeral head. IMPRESSION: No significant change in the displaced left humeral neck fracture or dislocation. Electronically Signed   By: Rolm Baptise M.D.   On: 09/12/2017 10:39   Dg Shoulder Left  Result Date: 09/11/2017 CLINICAL DATA:  Post reduction EXAM: LEFT SHOULDER - 2+ VIEW COMPARISON:  09/11/2017, 08/27/2017 FINDINGS: AC joint is intact. Persistent inferior and anterior dislocation of the humeral head with respect to the glenoid fossa, increased compared to the most recent prior radiograph with the superior aspect of the humeral head positioned at the level of the inferior glenoid. Acute slightly comminuted proximal humerus fracture with close to 1 bone with of displacement of distal fracture fragment away from midline. Decreased angulation. IMPRESSION: 1. Decreased angulation of proximal humerus fracture but with  residual close to 1 bone with of displacement of distal fracture fragment. 2. Persistent inferior and anterior dislocation of the humeral head with respect to the glenoid fossa, increased compared to the image performed earlier today. Electronically Signed   By: Donavan Foil M.D.   On: 09/11/2017 22:50   Dg Shoulder Left  Result Date: 09/11/2017 CLINICAL DATA:  Left shoulder pain following a fall today. Left humeral neck fracture 2 weeks ago. EXAM: LEFT SHOULDER - 2+ VIEW COMPARISON:  08/27/2017. FINDINGS: The previously demonstrated left humeral neck fracture is again demonstrated with interval 1/2 shaft width of lateral displacement and medial angulation of the distal fragment. There is also increased overlapping of the fragments. There is also interval anterior dislocation of the humeral head relative to the glenoid. No new fractures are seen. IMPRESSION: Increased displacement and angulation of the previously demonstrated left humeral neck fracture with interval anterior dislocation of the humeral head. Electronically Signed   By: Claudie Revering M.D.   On: 09/11/2017 17:41   Dg Shoulder Left  Result Date: 08/27/2017 CLINICAL DATA:  84 year old female with fall and left shoulder pain. EXAM: LEFT SHOULDER - 2+ VIEW COMPARISON:  None. FINDINGS: There is a mildly displaced oblique fracture of the left humeral neck. The humeral head remains in anatomic alignment with the glenoid. The bones are osteopenic. There is degenerative changes of the left shoulder. The soft tissues appear unremarkable. IMPRESSION: Mildly displaced fracture of the left humeral neck.  No dislocation. Electronically Signed   By: Anner Crete M.D.   On: 08/27/2017 23:26   Dg Shoulder Left Port  Result Date: 09/13/2017 CLINICAL DATA:  Evaluate surgical pair of the proximal left humerus. EXAM: LEFT SHOULDER - 1 VIEW COMPARISON:  09/12/2017 FINDINGS: Plate and screw fixation of the displaced proximal humeral fracture. Left shoulder is  located. Degenerative changes at the left [facility] joint. Normal alignment of left AC joint. IMPRESSION: Surgical fixation of the displaced left proximal humeral fracture. Electronically Signed   By: Scherrie Gerlach.D.  On: 09/13/2017 09:37   Dg Shoulder Left Port  Result Date: 09/12/2017 CLINICAL DATA:  Status post internal fixation of left humeral fracture. EXAM: LEFT SHOULDER - 1 VIEW COMPARISON:  Left shoulder radiographs performed earlier today at 10:16 a.m. FINDINGS: The patient is status post internal fixation of the comminuted left humeral fracture, with mild residual displacement. The left humeral head remains seated at the glenoid fossa. Minimal degenerative change is noted at the acromioclavicular joint. The visualized portions of the left lung are clear. Soft tissue swelling is noted about the proximal left arm. IMPRESSION: Status post internal fixation of comminuted left humeral fracture, with mild residual displacement. Electronically Signed   By: Garald Balding M.D.   On: 09/12/2017 21:47   Dg Humerus Left  Result Date: 09/12/2017 CLINICAL DATA:  Left shoulder fracture EXAM: LEFT HUMERUS - 2+ VIEW; DG C-ARM 61-120 MIN COMPARISON:  09/12/2017 FINDINGS: Eleven low resolution intraoperative spot views of the left humerus. Total fluoroscopy time was 45 seconds. Images were obtained during operative course of surgical plate and multiple screw fixation of proximal humerus fracture. There is decreased angulation and displacement. There are displaced fracture fragments noted both anterior and posterior on the final image. IMPRESSION: Intraoperative fluoroscopic assistance provided during surgical fixation of proximal left humerus fracture Electronically Signed   By: Donavan Foil M.D.   On: 09/12/2017 21:11   Dg C-arm 1-60 Min  Result Date: 09/12/2017 CLINICAL DATA:  Left shoulder fracture EXAM: LEFT HUMERUS - 2+ VIEW; DG C-ARM 61-120 MIN COMPARISON:  09/12/2017 FINDINGS: Eleven low resolution  intraoperative spot views of the left humerus. Total fluoroscopy time was 45 seconds. Images were obtained during operative course of surgical plate and multiple screw fixation of proximal humerus fracture. There is decreased angulation and displacement. There are displaced fracture fragments noted both anterior and posterior on the final image. IMPRESSION: Intraoperative fluoroscopic assistance provided during surgical fixation of proximal left humerus fracture Electronically Signed   By: Donavan Foil M.D.   On: 09/12/2017 21:11   Dg Hip Unilat W Or Wo Pelvis 2-3 Views Left  Result Date: 09/11/2017 CLINICAL DATA:  Left hip pain following a fall in the kitchen at home today. EXAM: DG HIP (WITH OR WITHOUT PELVIS) 2-3V LEFT COMPARISON:  None. FINDINGS: Normal appearing left hip without fracture or dislocation. Lower lumbar spine degenerative changes. IMPRESSION: No hip fracture or dislocation seen. Lower lumbar spine degenerative changes. Electronically Signed   By: Claudie Revering M.D.   On: 09/11/2017 17:42   Ct Maxillofacial Wo Contrast  Result Date: 09/11/2017 CLINICAL DATA:  84 year old female post fall in kitchen. Denies loss of consciousness. Initial encounter. EXAM: CT HEAD WITHOUT CONTRAST CT MAXILLOFACIAL WITHOUT CONTRAST CT CERVICAL SPINE WITHOUT CONTRAST TECHNIQUE: Multidetector CT imaging of the head, cervical spine, and maxillofacial structures were performed using the standard protocol without intravenous contrast. Multiplanar CT image reconstructions of the cervical spine and maxillofacial structures were also generated. COMPARISON:  08/28/2017 head CT and cervical spine CT. FINDINGS: CT HEAD FINDINGS Brain: No intracranial hemorrhage or CT evidence of large acute infarct. Prominent chronic microvascular changes. Atrophy No intracranial mass lesion noted on this unenhanced exam. Vascular: Vascular calcifications Skull: No skull fracture Other: Prominent hematoma left frontal/supraorbital region.  CT MAXILLOFACIAL FINDINGS Osseous: Nondisplaced fracture anterior and possibly posterior aspect of the left maxillary sinus. Orbits: Orbital structures appear grossly intact. Sinuses: Fluid level within the left maxillary sinus may represent result of small hemorrhage. Soft tissues: Large left frontal/supraorbital hematoma. CT CERVICAL SPINE FINDINGS  Alignment: No malalignment. Skull base and vertebrae: No cervical spine fracture. Soft tissues and spinal canal: No abnormal prevertebral soft tissue swelling. Disc levels:  Cervical spondylotic changes similar to prior exam. Upper chest: No worrisome abnormality. Other: No worrisome abnormality. IMPRESSION: CT HEAD Prominent hematoma left frontal/supraorbital region. No skull fracture or intracranial hemorrhage. Prominent chronic microvascular changes. Atrophy CT MAXILLOFACIAL Nondisplaced fracture anterior and possibly posterior wall of the left maxillary sinus. Fluid level within the left maxillary sinus may represent result of small hemorrhage. CT CERVICAL SPINE No cervical spine fracture or malalignment. No abnormal prevertebral soft tissue swelling. Results discussed with Roselyn Reef Ward physician's assistant at the time of imaging. Electronically Signed   By: Genia Del M.D.   On: 09/11/2017 18:15     Kathrene Alu, MD 09/13/2017, 8:09 AM PGY-1, [facility] Intern pager: [phone], text pages welcome

## 2017-09-13 NOTE — Progress Notes (Signed)
Chaplain came to Pt.'s room by way of Spiritual Care consult. The consult was for Advanced Directive Education / update. At two attempted times of visit Pt. Was receiving service from other members of the inter-professional team. Chaplain had a very brief conversation with a family member. Need to spend some additional time with patient and family system to get clarity on the need for the AD and how it connects to the pt.'s current health and care plan going forward.

## 2017-09-13 NOTE — Evaluation (Signed)
Occupational Therapy Evaluation Patient Details Name: Becky Leach MRN: [id number] DOB: 02/01/1930 Today's Date: 09/13/2017    History of Present Illness Becky Ashline Austinis a 84 y.o.femalepresenting with DisplacedL humeral head fracture&newLanterior shoulder dislocation and maxillary sinus fracture s/p fall. PMH is significant forHTN, spinal stenosis, OA, h/o PE 04/2017, CKD III.   Clinical Impression   PTA, pt was able to complete basic ADL and short-distance mobility with RW in her home. She lives with her son who is able to provide assistance at night. Pt utilizes [facility] for toileting tasks and has one in her living room and in her bedroom. Pt currently requires mod assist for ambulating toilet transfers, max assist for UB ADL, and max assist for LB ADL. Initiated education with pt concerning HEP for shoulder PROM and elbow/wrist/hand AROM or PROM. Will continue education when family is present for carry-over once pt is discharged. She would benefit from continued OT services while admitted to improve independence and safety with ADL and functional mobility prior to returning home with family. Family and pt report preference for return home and they are already arranging 24 hour assistance. Recommend home health OT follow-up.     Follow Up Recommendations  Home health OT;Supervision/Assistance - 24 hour(family reports working on 24 hour assistance)    Equipment Recommendations  None recommended by OT    Recommendations for Other Services       Precautions / Restrictions Precautions Precautions: Fall;Shoulder Type of Shoulder Precautions: Sling while mobilizing; NWB LUE; PROM L shoulder (forward flexion 0-90, abduction 0-60, external rotation 0-30); active or passive ROM L shoulder; no AROM L shoulder. Pt able to complete pendulums per PA.  Shoulder Interventions: Shoulder sling/immobilizer(for mobilization) Precaution Comments: Pt educated concerning shoulder precautions and ADL.   Required Braces or Orthoses: Sling(for mobilization) Restrictions Weight Bearing Restrictions: Yes LUE Weight Bearing: Non weight bearing      Mobility Bed Mobility Overal bed mobility: Needs Assistance Bed Mobility: Rolling;Sidelying to Sit Rolling: Min assist Sidelying to sit: Mod assist       General bed mobility comments: OOB in chair on my arrival.   Transfers Overall transfer level: Needs assistance Equipment used: 1 person hand held assist Transfers: Sit to/from Stand Sit to Stand: Mod assist Stand pivot transfers: Mod assist       General transfer comment: Able to complete sit<>stand with mod assist and ambulate in room with mod assist.     Balance Overall balance assessment: Needs assistance Sitting-balance support: No upper extremity supported;Feet supported Sitting balance-Leahy Scale: Fair     Standing balance support: Single extremity supported;During functional activity Standing balance-Leahy Scale: Poor                             ADL either performed or assessed with clinical judgement   ADL Overall ADL's : Needs assistance/impaired Eating/Feeding: Supervision/ safety;Sitting   Grooming: Minimal assistance;Sitting   Upper Body Bathing: Maximal assistance;Sitting   Lower Body Bathing: Maximal assistance;Sit to/from stand   Upper Body Dressing : Maximal assistance;Sitting   Lower Body Dressing: Maximal assistance;Sit to/from stand   Toilet Transfer: Moderate assistance;Ambulation Toilet Transfer Details (indicate cue type and reason): Ususally uses RW so this was difficult. Using single hand assist from OT.  Toileting- Clothing Manipulation and Hygiene: Maximal assistance;Sit to/from stand       Functional mobility during ADLs: Moderate assistance General ADL Comments: Initiated education concerning shoulder precautions related to ADL participation as well as HEP.  Need to further educate when family present.      Vision  Patient Visual Report: No change from baseline Vision Assessment?: No apparent visual deficits     Perception     Praxis      Pertinent Vitals/Pain Pain Assessment: Faces Faces Pain Scale: Hurts even more Pain Location: L shoulder during exercises Pain Descriptors / Indicators: Aching;Grimacing Pain Intervention(s): Monitored during session     Hand Dominance Right   Extremity/Trunk Assessment Upper Extremity Assessment Upper Extremity Assessment: LUE deficits/detail LUE Deficits / Details: Able to achieve ~0-10 degrees forward flexion passively at shoulder, ~0-neutral shoulder external rotation, and ~0-30 L shoulder abduction. Lacking 15 degrees L elbow extension. L wrist with arthritic changes and difficult to assess.    Lower Extremity Assessment Lower Extremity Assessment: Generalized weakness       Communication Communication Communication: No difficulties   Cognition Arousal/Alertness: Awake/alert Behavior During Therapy: WFL for tasks assessed/performed Overall Cognitive Status: Within Functional Limits for tasks assessed                                     General Comments       Exercises Exercises: Shoulder Shoulder Exercises Pendulum Exercise: AROM;10 reps;Left;Standing(with mod handheld assist from therapist) Shoulder Flexion: PROM;Left;10 reps;Supine(able to achieve ~0-10 degrees this session) Shoulder ABduction: Left;10 reps;Seated;PROM(~0-30 achieved this session) Shoulder External Rotation: PROM;Left;10 reps;Seated(~0-neutral achieved this session. ) Elbow Flexion: AROM;Left;10 reps;Standing Elbow Extension: AROM;Left;10 reps;Standing(lacking 10 degrees extension) Wrist Flexion: AROM;Left;10 reps;Seated Wrist Extension: AROM;Left;10 reps;Seated Digit Composite Flexion: AROM;Left;10 reps;Seated Composite Extension: AROM;Left;10 reps;Seated   Shoulder Instructions Shoulder Instructions Donning/doffing shirt without moving shoulder:  Maximal assistance Method for sponge bathing under operated UE: Maximal assistance Donning/doffing sling/immobilizer: Maximal assistance Correct positioning of sling/immobilizer: Maximal assistance Pendulum exercises (written home exercise program): Moderate assistance ROM for elbow, wrist and digits of operated UE: Minimal assistance Sling wearing schedule (on at all times/off for ADL's): Minimal assistance Proper positioning of operated UE when showering: Minimal assistance Positioning of UE while sleeping: Minimal assistance    Home Living Family/patient expects to be discharged to:: Private residence Living Arrangements: Children Available Help at Discharge: Family(Son indicates they intend to hire more home help) Type of Home: House Home Access: Stairs to enter [employer] of Steps: 6" coming from street, then 3 steps with 2 railings to use  Entrance Stairs-Rails: Right;Left;Can reach both Home Layout: One level     Bathroom Shower/Tub: Teacher, early years/pre: [place] - single point;Bedside commode;Walker - 2 wheels          Prior Functioning/Environment Level of Independence: Independent with assistive device(s)                 OT Problem List: Decreased strength;Decreased range of motion;Decreased activity tolerance;Impaired balance (sitting and/or standing);Decreased safety awareness;Decreased knowledge of use of DME or AE;Decreased knowledge of precautions;Pain      OT Treatment/Interventions: Self-care/ADL training;Therapeutic exercise;Energy conservation;DME and/or AE instruction;Therapeutic activities;Patient/family education;Balance training    OT Goals(Current goals can be found in the care plan section) Acute Rehab OT Goals Patient Stated Goal: feel better OT Goal Formulation: With patient/family Time For Goal Achievement: 09/27/17 Potential to Achieve Goals: Good ADL Goals Pt Will Perform Grooming:  with modified independence;sitting Pt Will Perform Upper Body Dressing: with min assist;sitting Pt Will Transfer to Toilet: with min guard assist;ambulating;bedside commode Pt Will Perform Toileting -  Clothing Manipulation and hygiene: with modified independence;sitting/lateral leans Pt/caregiver will Perform Home Exercise Program: Left upper extremity;With written HEP provided(per shoulder protocol)  OT Frequency: Min 3X/week   Barriers to D/C:            Co-evaluation              AM-PAC PT "6 Clicks" Daily Activity     Outcome Measure Help from another person eating meals?: A Little Help from another person taking care of personal grooming?: A Little Help from another person toileting, which includes using toliet, bedpan, or urinal?: A Lot Help from another person bathing (including washing, rinsing, drying)?: A Lot Help from another person to put on and taking off regular upper body clothing?: A Lot Help from another person to put on and taking off regular lower body clothing?: A Lot 6 Click Score: 14   End of Session Equipment Utilized During Treatment: Gait belt(L shoulder sling) Nurse Communication: Mobility status  Activity Tolerance: Patient tolerated treatment well Patient left: in chair;with call bell/phone within reach;with chair alarm set  OT Visit Diagnosis: Other abnormalities of gait and mobility (R26.89);Pain Pain - Right/Left: Left Pain - part of body: Shoulder                Time: 8177-1165 OT Time Calculation (min): 26 min Charges:  OT General Charges $OT Visit: 1 Visit OT Evaluation $OT Eval Moderate Complexity: 1 Mod OT Treatments $Self Care/Home Management : 8-22 mins G-Codes:     Norman Herrlich, MS OTR/L  Pager: Gibsonia A Chelcee Korpi 09/13/2017, 3:34 PM

## 2017-09-13 NOTE — Discharge Instructions (Addendum)
[facility]thopaedic Trauma Service Discharge Instructions   General Discharge Instructions  WEIGHT BEARING STATUS: Nonweightbearing Left upper extremity   RANGE OF MOTION/ACTIVITY: gentle passive motion of Left shoulder. Active and passive motion of Left elbow, forearm, wrist and hand.  Sling only needs to be on when up walking. You do not have to sleep in sling but can if it is more comfortable   Wound Care: daily wound care as needed starting on 09/16/2017. See below  Discharge Wound Care Instructions  Do NOT apply any ointments, solutions or lotions to pin sites or surgical wounds.  These prevent needed drainage and even though solutions like hydrogen peroxide kill bacteria, they also damage cells lining the pin sites that help fight infection.  Applying lotions or ointments can keep the wounds moist and can cause them to breakdown and open up as well. This can increase the risk for infection. When in doubt call the office.  Surgical incisions should be dressed daily.  If any drainage is noted, use one layer of adaptic, then gauze, Kerlix, and an ace wrap.  Once the incision is completely dry and without drainage, it may be left open to air out.  Showering may begin 36-48 hours later.  Cleaning gently with soap and water.  Traumatic wounds should be dressed daily as well.    One layer of adaptic, gauze, Kerlix, then ace wrap.  The adaptic can be discontinued once the draining has ceased    If you have a wet to dry dressing: wet the gauze with saline the squeeze as much saline out so the gauze is moist (not soaking wet), place moistened gauze over wound, then place a dry gauze over the moist one, followed by Kerlix wrap, then ace wrap.  DVT/PE prophylaxis: eliquis as you were on before admission   Diet: as you were eating previously.  Can use over the counter stool softeners and bowel preparations, such as Miralax, to help with bowel movements.  Narcotics can be constipating.  Be sure to drink  plenty of fluids  PAIN MEDICATION USE AND EXPECTATIONS  You have likely been given narcotic medications to help control your pain.  After a traumatic event that results in an fracture (broken bone) with or without surgery, it is ok to use narcotic pain medications to help control one's pain.  We understand that everyone responds to pain differently and each individual patient will be evaluated on a regular basis for the continued need for narcotic medications. Ideally, narcotic medication use should last no more than 6-8 weeks (coinciding with fracture healing).   As a patient it is your responsibility as well to monitor narcotic medication use and report the amount and frequency you use these medications when you come to your office visit.   We would also advise that if you are using narcotic medications, you should take a dose prior to therapy to maximize you participation.  IF YOU ARE ON NARCOTIC MEDICATIONS IT IS NOT PERMISSIBLE TO OPERATE A MOTOR VEHICLE (MOTORCYCLE/CAR/TRUCK/MOPED) OR HEAVY MACHINERY DO NOT MIX NARCOTICS WITH OTHER CNS (CENTRAL NERVOUS SYSTEM) DEPRESSANTS SUCH AS ALCOHOL   STOP SMOKING OR USING NICOTINE PRODUCTS!!!!  As discussed nicotine severely impairs your body's ability to heal surgical and traumatic wounds but also impairs bone healing.  Wounds and bone heal by forming microscopic blood vessels (angiogenesis) and nicotine is a vasoconstrictor (essentially, shrinks blood vessels).  Therefore, if vasoconstriction occurs to these microscopic blood vessels they essentially disappear and are unable to deliver necessary nutrients  to the healing tissue.  This is one modifiable factor that you can do to dramatically increase your chances of healing your injury.    (This means no smoking, no nicotine gum, patches, etc)  DO NOT USE NONSTEROIDAL ANTI-INFLAMMATORY DRUGS (NSAID'S)  Using products such as Advil (ibuprofen), Aleve (naproxen), Motrin (ibuprofen) for additional pain  control during fracture healing can delay and/or prevent the healing response.  If you would like to take over the counter (OTC) medication, Tylenol (acetaminophen) is ok.  However, some narcotic medications that are given for pain control contain acetaminophen as well. Therefore, you should not exceed more than 4000 mg of tylenol in a day if you do not have liver disease.  Also note that there are may OTC medicines, such as cold medicines and allergy medicines that my contain tylenol as well.  If you have any questions about medications and/or interactions please ask your doctor/PA or your pharmacist.      ICE AND ELEVATE INJURED/OPERATIVE EXTREMITY  Using ice and elevating the injured extremity above your heart can help with swelling and pain control.  Icing in a pulsatile fashion, such as 20 minutes on and 20 minutes off, can be followed.    Do not place ice directly on skin. Make sure there is a barrier between to skin and the ice pack.    Using frozen items such as frozen peas works well as the conform nicely to the are that needs to be iced.  USE AN ACE WRAP OR TED HOSE FOR SWELLING CONTROL  In addition to icing and elevation, Ace wraps or TED hose are used to help limit and resolve swelling.  It is recommended to use Ace wraps or TED hose until you are informed to stop.    When using Ace Wraps start the wrapping distally (farthest away from the body) and wrap proximally (closer to the body)   Example: If you had surgery on your leg or thing and you do not have a splint on, start the ace wrap at the toes and work your way up to the thigh        If you had surgery on your upper extremity and do not have a splint on, start the ace wrap at your fingers and work your way up to the upper arm  IF YOU ARE IN A SPLINT OR CAST DO NOT [place]   If your splint gets wet for any reason please contact the office immediately. You may shower in your splint or cast as long as you keep it dry.   This can be done by wrapping in a cast cover or garbage back (or similar)  Do Not stick any thing down your splint or cast such as pencils, money, or hangers to try and scratch yourself with.  If you feel itchy take benadryl as prescribed on the bottle for itching  IF YOU ARE IN A CAM BOOT (BLACK BOOT)  You may remove boot periodically. Perform daily dressing changes as noted below.  Wash the liner of the boot regularly and wear a sock when wearing the boot. It is recommended that you sleep in the boot until told otherwise  CALL THE OFFICE WITH ANY QUESTIONS OR CONCERNS: [phone]      Information on my medicine - ELIQUIS (apixaban)  This medication education was reviewed with me or my healthcare representative as part of my discharge preparation.  The pharmacist that spoke with me during my hospital stay was:  Johnnette Gourd [place], [place]  Why was Eliquis prescribed for you? Eliquis was prescribed to treat blood clots that may have been found in the veins of your legs (deep vein thrombosis) or in your lungs (pulmonary embolism) and to reduce the risk of them occurring again.  What do You need to know about Eliquis ? The dose is ONE 5 mg tablet taken TWICE daily.  Eliquis may be taken with or without food.   Try to take the dose about the same time in the morning and in the evening. If you have difficulty swallowing the tablet whole please discuss with your pharmacist how to take the medication safely.  Take Eliquis exactly as prescribed and DO NOT stop taking Eliquis without talking to the doctor who prescribed the medication.  Stopping may increase your risk of developing a new blood clot.  Refill your prescription before you run out.  After discharge, you should have regular check-up appointments with your healthcare provider that is prescribing your Eliquis.    What do you do if you miss a dose? If a dose of ELIQUIS is not taken at the scheduled time, take it as soon as possible  on the same day and twice-daily administration should be resumed. The dose should not be doubled to make up for a missed dose.  Important Safety Information A possible side effect of Eliquis is bleeding. You should call your healthcare provider right away if you experience any of the following: ? Bleeding from an injury or your nose that does not stop. ? Unusual colored urine (red or dark brown) or unusual colored stools (red or black). ? Unusual bruising for unknown reasons. ? A serious fall or if you hit your head (even if there is no bleeding).  Some medicines may interact with Eliquis and might increase your risk of bleeding or clotting while on Eliquis. To help avoid this, consult your healthcare provider or pharmacist prior to using any new prescription or non-prescription medications, including herbals, vitamins, non-steroidal anti-inflammatory drugs (NSAIDs) and supplements.  This website has more information on Eliquis (apixaban): http://www.eliquis.com/eliquis/home

## 2017-09-13 NOTE — Progress Notes (Signed)
ANTICOAGULATION CONSULT NOTE - Follow Up Consult  Pharmacy Consult:  Eliquis Indication: History of VTE  Allergies  Allergen Reactions  . Penicillins Rash    Patient Measurements: Height: 5\' 2"  (157.5 cm) Weight: 110 lb 1.6 oz (49.9 kg) IBW/kg (Calculated) : 50.1  Vital Signs: Temp: 98.2 F (36.8 C) (04/17 0934) Temp Source: Oral (04/17 0934) BP: 108/79 (04/17 0934) Pulse Rate: 100 (04/17 0934)  Labs: Recent Labs    09/11/17 1918 09/12/17 0203 09/13/17 0441  HGB 11.6* 12.5 10.6*  HCT 34.9* 36.9 31.7*  PLT 391 414* 364  APTT  --  31  --   LABPROT  --  16.4*  --   INR  --  1.34  --   HEPARINUNFRC  --  >2.20*  --   CREATININE 1.10* 1.10*  --     Estimated Creatinine Clearance: 28.9 mL/min (A) (by C-G formula based on SCr of 1.1 mg/dL (H)).     Assessment: 89 YOF presented s/p fall with humeral fracture.  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  Now s/p ORIF and Pharmacy consulted to resume Eliquis.  CBC trended down post-op, no overt bleeding documented.   Goal of Therapy:  Full anticoagulation Monitor platelets by anticoagulation protocol: Yes    Plan:  Eliquis 5mg  PO BID Pharmacy will sign off and follow peripherally.  Thank you for the consult!   Leatha Rohner D. Mina Marble, PharmD, BCPS Pager:  [phone] 09/13/2017, 9:48 AM

## 2017-09-13 NOTE — Telephone Encounter (Signed)
Son calling to inform that his mother fell again and had fracture/surgery.  Also had recently fell last month.  Son went to the store and patient got up unassisted resulting in the fall.  Requesting Dr. Reesa Chew to complete PCS form 3051 from [employer] [place] ([place]) to have someone come sit with patient and help with ADLs.  Son sometimes has to go to store and to appts and needs for someone to sit with patient.    Form printed out and will place in Dr. Latina Craver box for completion.  Can fax form to [phone].   Verified that patient section of form has been completed.  Last DOS/WCC with PCP was 05/25/17.  Placed form in team folder to be completed by clinical staff.  Will call son when form is completed/faxed or if Dr. Reesa Chew has questions.  Burna Forts, BSN, RN-BC   Marvel Plan, Arlyss Gandy, RN

## 2017-09-13 NOTE — Progress Notes (Addendum)
Orthopedic Trauma Service Progress Note   Patient ID: TWANNA RESH MRN: [id number] DOB/AGE: 08/03/1929 84 y.o.  Subjective:  No acute issues L shoulder painful but tolerable   ROS  Objective:   VITALS:   Vitals:   09/12/17 2100 09/12/17 2115 09/12/17 2157 09/13/17 0604  BP: (!) 134/97 (!) 156/103 (!) 139/93 111/73  Pulse:  (!) 118 (!) 119 93  Resp: (!) 21 19 16 16   Temp:   98.8 F (37.1 C) 98.7 F (37.1 C)  TempSrc:   Oral Oral  SpO2:  100% 96% 98%  Weight:   49.9 kg (110 lb 1.6 oz)   Height:   5\' 2"  (1.575 m)     Estimated body mass index is 20.14 kg/m as calculated from the following:   Height as of this encounter: 5\' 2"  (1.575 m).   Weight as of this encounter: 49.9 kg (110 lb 1.6 oz).   Intake/Output      04/16 0701 - 04/17 0700 04/17 0701 - 04/18 0700   I.V. (mL/kg) 1120.3 (22.5)    IV Piggyback 150    Total Intake(mL/kg) 1270.3 (25.5)    Urine (mL/kg/hr) 300 (0.3)    Blood 60    Total Output 360    Net +910.3         Urine Occurrence 3 x      LABS  Results for orders placed or performed during the hospital encounter of 09/11/17 (from the past 24 hour(s))  CBC     Status: Abnormal   Collection Time: 09/13/17  4:41 AM  Result Value Ref Range   WBC 8.8 4.0 - 10.5 K/uL   RBC 3.66 (L) 3.87 - 5.11 MIL/uL   Hemoglobin 10.6 (L) 12.0 - 15.0 g/dL   HCT 31.7 (L) 36.0 - 46.0 %   MCV 86.6 78.0 - 100.0 fL   MCH 29.0 26.0 - 34.0 pg   MCHC 33.4 30.0 - 36.0 g/dL   RDW 15.0 11.5 - 15.5 %   Platelets 364 150 - 400 K/uL     PHYSICAL EXAM:   Gen: resting comfortably in bed, NAD, appears well Lungs: breathing unlabored Cardiac: regular  Ext:       Left Upper extremity   Dressing L shoulder stable, scant drainage noted  Sling in place  Radial, ulnar, median nerve motor and sensory functions intact  Axillary nerve sensation intact  + radial pulse   Swelling controlled  Good elbow, forearm, wrist and hand ROM    Assessment/Plan: 1 Day Post-Op   Principal Problem:   Maxillary sinus fracture (HCC) Active Problems:   Benign essential HTN   Spinal stenosis of lumbar region   Unilateral primary osteoarthritis, left knee   Unilateral primary osteoarthritis, right knee   Impaired ambulation   Humeral fracture   Thrombocytosis (HCC)   Dislocation of left shoulder joint   Acute head trauma   Fall   At risk for hemorrhage associated with anticoagulation therapy   Fever   Frequent falls   Anti-infectives (From admission, onward)   Start     Dose/Rate Route Frequency Ordered Stop   09/13/17 0600  ceFAZolin (ANCEF) IVPB 2g/100 mL premix     2 g 200 mL/hr over 30 Minutes Intravenous On call to O.R. 09/12/17 1546 09/12/17 1700   09/12/17 2300  ceFAZolin (ANCEF) IVPB 1 g/50 mL premix    Note to Pharmacy:  Tolerated ancef in OR   1 g 100 mL/hr over 30 Minutes Intravenous Every 6 hours 09/12/17  2154 09/13/17 1659   09/12/17 1551  ceFAZolin (ANCEF) 2-4 GM/100ML-% IVPB    Note to Pharmacy:  Henrine Screws   : cabinet override      09/12/17 1551 09/13/17 0359    .  POD/HD#: 1  84 y/o RHD black female with recurrent falls with displacement of known L proximal humerus fracture and dislocation of humeral head    -displaced L proximal humerus fracture and L humeral head pseudodislocation  NWB L upper extremity   Ok for shoulder pendulum and PROM of L shoulder    AROM and PROM of L elbow, forearm, wrist and hand   Sling only needs to be on when mobilizing o/w it may be off  Ice prn   Dressing change tomorrow   PT/OT consults                - Pain management:             Scheduled tylenol              ultram and morphine PRN   - ABL anemia/Hemodynamics             Monitor   - Medical issues              Per medical service   - DVT/PE prophylaxis:             Pt on chronic anticoagulation PTA                         ok to resume eliquis from ortho standpoint    - ID:               periop abx  Tolerated ancef   - Metabolic Bone Disease:             Vitamin d pending    - FEN/GI prophylaxis/Foley/Lines:             advance diet as tolerated    - Dispo:            therapy evals  ?home tomorrow    Jari Pigg, PA-C [facility] [phone] (P) [phone] Levi Aland (C) 09/13/2017, 8:38 AM

## 2017-09-13 NOTE — Plan of Care (Signed)
  Problem: Clinical Measurements: Goal: Postoperative complications will be avoided or minimized Outcome: Progressing   Problem: Skin Integrity: Goal: Demonstration of wound healing without infection will improve Outcome: Progressing   

## 2017-09-13 NOTE — Plan of Care (Signed)
  Problem: Education: Goal: Knowledge of General Education information will improve Outcome: Progressing   Problem: Health Behavior/Discharge Planning: Goal: Ability to manage health-related needs will improve Outcome: Progressing   Problem: Clinical Measurements: Goal: Respiratory complications will improve Outcome: Progressing   Problem: Pain Managment: Goal: General experience of comfort will improve Outcome: Progressing   

## 2017-09-13 NOTE — Evaluation (Signed)
Physical Therapy Evaluation Patient Details Name: Becky Leach MRN: [id number] DOB: 1929/07/23 Today's Date: 09/13/2017   History of Present Illness  Becky Renzulli Austinis a 84 y.o.femalepresenting with DisplacedL humeral head fracture&newLanterior shoulder dislocation and maxillary sinus fracture s/p fall. PMH is significant forHTN, spinal stenosis, OA, h/o PE 04/2017, CKD III.  Clinical Impression  Patient is s/p above surgery resulting in functional limitations due to the deficits listed below (see PT Problem List). Requires mod assist for balance with sit to stand and ambulation; Concern for frequent falls, noted sons are looking into more help in the home; Patient will benefit from skilled PT to increase their independence and safety with mobility to allow discharge to the venue listed below.       Follow Up Recommendations Home health PT;Other (comment)(As well as HHOT and HHAide)    Equipment Recommendations  Cane;Other (comment)(versus hemiwalker)    Recommendations for Other Services OT consult(as ordered)     Precautions / Restrictions Precautions Precautions: Fall      Mobility  Bed Mobility Overal bed mobility: Needs Assistance Bed Mobility: Rolling;Sidelying to Sit Rolling: Min assist Sidelying to sit: Mod assist       General bed mobility comments: min assist and lots of cues to roll onto R side; light mod assist to push up to sit  Transfers Overall transfer level: Needs assistance Equipment used: 1 person hand held assist Transfers: Stand Pivot Transfers   Stand pivot transfers: Mod assist       General transfer comment: Mod assist to power up  Ambulation/Gait Ambulation/Gait assistance: Mod assist Ambulation Distance (Feet): 20 Feet Assistive device: 1 person hand held assist Gait Pattern/deviations: Decreased step length - right;Decreased step length - left     General Gait Details: Dependent on RUE support for balance with amb  Stairs             Wheelchair Mobility    Modified Rankin (Stroke Patients Only)       Balance Overall balance assessment: Needs assistance Sitting-balance support: No upper extremity supported;Feet supported Sitting balance-Leahy Scale: Fair       Standing balance-Leahy Scale: Poor                               Pertinent Vitals/Pain Pain Assessment: Faces Faces Pain Scale: Hurts little more Pain Location: L shoudler Pain Descriptors / Indicators: Aching;Grimacing Pain Intervention(s): Monitored during session    Home Living Family/patient expects to be discharged to:: Private residence Living Arrangements: Children Available Help at Discharge: Family(Son indicates they intend to hire more home help) Type of Home: House Home Access: Stairs to enter Entrance Stairs-Rails: Right;Left;Can reach both Entrance Stairs-Number of Steps: 6" coming from street, then 3 steps with 2 railings to use  Home Layout: One level Home Equipment: Cane - single point;Bedside commode;Walker - 2 wheels      Prior Function Level of Independence: Independent with assistive device(s)               Hand Dominance        Extremity/Trunk Assessment   Upper Extremity Assessment Upper Extremity Assessment: Defer to OT evaluation    Lower Extremity Assessment Lower Extremity Assessment: Generalized weakness       Communication   Communication: No difficulties  Cognition Arousal/Alertness: Awake/alert Behavior During Therapy: WFL for tasks assessed/performed Overall Cognitive Status: Within Functional Limits for tasks assessed  General Comments      Exercises     Assessment/Plan    PT Assessment Patient needs continued PT services  PT Problem List Decreased strength;Decreased range of motion;Decreased activity tolerance;Decreased balance;Decreased mobility;Decreased coordination;Decreased knowledge of use of  DME;Decreased safety awareness;Pain       PT Treatment Interventions DME instruction;Gait training;Stair training;Functional mobility training;Therapeutic activities;Therapeutic exercise;Balance training;Neuromuscular re-education;Patient/family education    PT Goals (Current goals can be found in the Care Plan section)  Acute Rehab PT Goals Patient Stated Goal: did not state, but happy to be OOB PT Goal Formulation: With patient/family Time For Goal Achievement: 09/27/17 Potential to Achieve Goals: Good    Frequency Min 3X/week   Barriers to discharge Decreased caregiver support Family is looking into getting a Home Health Aide    Co-evaluation               AM-PAC PT "6 Clicks" Daily Activity  Outcome Measure Difficulty turning over in bed (including adjusting bedclothes, sheets and blankets)?: A Lot Difficulty moving from lying on back to sitting on the side of the bed? : Unable Difficulty sitting down on and standing up from a chair with arms (e.g., wheelchair, bedside commode, etc,.)?: Unable Help needed moving to and from a bed to chair (including a wheelchair)?: A Lot Help needed walking in hospital room?: A Lot Help needed climbing 3-5 steps with a railing? : A Lot 6 Click Score: 10    End of Session Equipment Utilized During Treatment: (Sling) Activity Tolerance: Patient tolerated treatment well Patient left: in chair;with call bell/phone within reach;Other (comment)(OT in room to begin session) Nurse Communication: Mobility status PT Visit Diagnosis: Unsteadiness on feet (R26.81);Other abnormalities of gait and mobility (R26.89);Repeated falls (R29.6);Pain Pain - Right/Left: Left Pain - part of body: Arm    Time: 0762-2633 PT Time Calculation (min) (ACUTE ONLY): 30 min   Charges:   PT Evaluation $PT Eval Moderate Complexity: 1 Mod PT Treatments $Gait Training: 8-22 mins   PT G Codes:        Roney Marion, PT  Acute Rehabilitation Services Pager  [phone] Office [phone]   [place] 09/13/2017, 3:09 PM

## 2017-09-14 ENCOUNTER — Encounter (HOSPITAL_COMMUNITY): Payer: Self-pay | Admitting: Orthopedic Surgery

## 2017-09-14 DIAGNOSIS — W19XXXD Unspecified fall, subsequent encounter: Secondary | ICD-10-CM

## 2017-09-14 DIAGNOSIS — R296 Repeated falls: Secondary | ICD-10-CM

## 2017-09-14 DIAGNOSIS — S42202A Unspecified fracture of upper end of left humerus, initial encounter for closed fracture: Secondary | ICD-10-CM | POA: Diagnosis present

## 2017-09-14 DIAGNOSIS — E559 Vitamin D deficiency, unspecified: Secondary | ICD-10-CM

## 2017-09-14 HISTORY — DX: Unspecified fracture of upper end of left humerus, initial encounter for closed fracture: S42.202A

## 2017-09-14 HISTORY — DX: Vitamin D deficiency, unspecified: E55.9

## 2017-09-14 LAB — CBC
HCT: 28.4 % — ABNORMAL LOW (ref 36.0–46.0)
HCT: 31.7 % — ABNORMAL LOW (ref 36.0–46.0)
HEMOGLOBIN: 9.5 g/dL — AB (ref 12.0–15.0)
HEMOGLOBIN: 10.4 g/dL — AB (ref 12.0–15.0)
MCH: 29.2 pg (ref 26.0–34.0)
MCH: 29.1 pg (ref 26.0–34.0)
MCHC: 33.5 g/dL (ref 30.0–36.0)
MCHC: 32.8 g/dL (ref 30.0–36.0)
MCV: 87.4 fL (ref 78.0–100.0)
MCV: 88.5 fL (ref 78.0–100.0)
PLATELETS: 313 10*3/uL (ref 150–400)
Platelets: 365 10*3/uL (ref 150–400)
RBC: 3.25 MIL/uL — AB (ref 3.87–5.11)
RBC: 3.58 MIL/uL — ABNORMAL LOW (ref 3.87–5.11)
RDW: 15.3 % (ref 11.5–15.5)
RDW: 15.4 % (ref 11.5–15.5)
WBC: 7.5 10*3/uL (ref 4.0–10.5)
WBC: 7.3 10*3/uL (ref 4.0–10.5)

## 2017-09-14 LAB — FERRITIN: Ferritin: 129 ng/mL (ref 11–307)

## 2017-09-14 LAB — IRON AND TIBC
IRON: 36 ug/dL (ref 28–170)
Saturation Ratios: 18 % (ref 10.4–31.8)
TIBC: 202 ug/dL — ABNORMAL LOW (ref 250–450)
UIBC: 166 ug/dL

## 2017-09-14 LAB — VITAMIN D 25 HYDROXY (VIT D DEFICIENCY, FRACTURES): Vit D, 25-Hydroxy: 19.6 ng/mL — ABNORMAL LOW (ref 30.0–100.0)

## 2017-09-14 MED ORDER — CHOLECALCIFEROL 50 MCG (2000 UT) PO TABS: 2000.0000 [IU] | ORAL_TABLET | Freq: Two times a day (BID) | ORAL | 0 refills | Status: DC

## 2017-09-14 MED ORDER — VITAMIN D 1000 UNITS PO TABS
2000.0000 [IU] | ORAL_TABLET | Freq: Two times a day (BID) | ORAL | Status: DC
  Administered 2017-09-14: 2000 [IU] via ORAL
  Filled 2017-09-14: qty 2

## 2017-09-14 NOTE — Progress Notes (Signed)
POA papers provided per request.

## 2017-09-14 NOTE — Care Management (Signed)
Hemi walker ordered through [facility].   Magdalen Spatz RN BSN [phone]

## 2017-09-14 NOTE — Progress Notes (Signed)
Family Medicine Teaching Service Daily Progress Note Intern Pager: [phone]  Patient name: Becky Leach record number: [id number] Date of birth: [birth year] Age: 84 y.o. Gender: female  Primary Care Provider: [name], MD Consultants: orthopedics Code Status: full  Pt Overview and Major Events to Date:  Admitted 4/16 ORIF of L humerus 4/16  Assessment and Plan: Becky Leach is a 84 y.o. female presenting with L humeral head and maxillary sinus fracture s/p fall. PMH is significant for HTN, spinal stenosis, OA, h/o PE 04/2017, CKD III.  Displaced L humeral head fracture & new L anterior shoulder dislocation after mechanical fall S/p ORIF of L humeral head on 4/16.  Per Ortho, NWB LUE, wear sling when mobilizing, ok to resume eliquis. Has not required PRN tramadol or morphine.  Vit D low, will supplement on discharge. Hb continues to drop, 9.5 this morning, will recheck this afternoon. Likely ok to go home today pending f/u CBC. - Vital per floor protocol - appreciate ortho recs - PT/INR - PT/OT - HHPT/OT, nurses aide  Facial hematoma and Maxillary sinus fracture after mechanical fall:   Swelling is much improved today. No indication for ENT consult or initiating antibiotics at this time - Apply ice to facial hematoma as needed  Normocytic Anemia Hb 11.6>>9.5, baseline ~12-13. Likely in the setting of acute fracture, inflammation and surgery. Iron panel wnl. Recheck in this afternoon prior to discharge. - f/u pm cbc  HTN Normotensive overnight.  Previously on antihypertensives in the past but states her PCP discontinued use about 1 year ago. - monitor  CKD III Cr on admission 1.10 which appears to be around baseline. - am BMP on 4/16 with creatinine 1.10  OA in Knee Ambulates with walker and/or cane at baseline.   H/o PE on eliquis In 04/2017, currently on 6 month course of Eliquis. Sinus tachycardia noted on admission however without SOB, chest pain. -  resume eliquis, ok per ortho  Seasonal allergies On Claritin at home - continue home med  Social: Lives with her son [name]. Denies having a HCPOA or living will. Son manages her finances per her report. - if not able to establish HCPOA this admission, can f/u outpatient.   FEN/GI: regular diet PPx: eliquis  Disposition: likely home today pending pm cbc  Subjective:  Patient feeling well today, states pain is well controlled.  Objective: Temp:  [97.8 F (36.6 C)-98.5 F (36.9 C)] 98.5 F (36.9 C) (04/18 0516) Pulse Rate:  [89-116] 89 (04/18 0516) Resp:  [16-20] 20 (04/18 0516) BP: (96-119)/(68-81) 119/81 (04/18 0516) SpO2:  [95 %-98 %] 97 % (04/18 0516) Physical Exam: General: elderly female lying in bed comfortably HEENT: swelling and ecchymosis significantly improved from admission, EOMI intact Cardiovascular: RRR. No MRG Respiratory: CTAB, normal WOB on RA Abdomen: soft, nontender Extremities: L arm in sling  Laboratory: Recent Labs  Lab 09/12/17 0203 09/13/17 0441 09/14/17 0404  WBC 7.9 8.8 7.5  HGB 12.5 10.6* 9.5*  HCT 36.9 31.7* 28.4*  PLT 414* 364 313   Recent Labs  Lab 09/11/17 1918 09/12/17 0203  NA 138 141  K 4.1 4.4  CL 103 105  CO2 25 25  BUN 22* 14  CREATININE 1.10* 1.10*  CALCIUM 9.3 9.7  GLUCOSE 119* 110*    Imaging/Diagnostic Tests: Dg Chest 1 View  Result Date: 09/11/2017 CLINICAL DATA:  Fever EXAM: CHEST  1 VIEW COMPARISON:  CT chest 05/11/2017, radiograph 05/11/2017 FINDINGS: Inferiorly displaced left humeral head with partially visible proximal  humerus fracture. No focal airspace disease or effusion. Stable enlarged cardiomediastinal silhouette with aortic atherosclerosis. No pneumothorax. IMPRESSION: 1. Cardiomegaly without acute pulmonary infiltrate 2. Partially visible fracture dislocation of the proximal left humerus Electronically Signed   By: Donavan Foil M.D.   On: 09/11/2017 22:51   Ct Head Wo Contrast  Result Date:  09/11/2017 CLINICAL DATA:  84 year old female post fall in kitchen. Denies loss of consciousness. Initial encounter. EXAM: CT HEAD WITHOUT CONTRAST CT MAXILLOFACIAL WITHOUT CONTRAST CT CERVICAL SPINE WITHOUT CONTRAST TECHNIQUE: Multidetector CT imaging of the head, cervical spine, and maxillofacial structures were performed using the standard protocol without intravenous contrast. Multiplanar CT image reconstructions of the cervical spine and maxillofacial structures were also generated. COMPARISON:  08/28/2017 head CT and cervical spine CT. FINDINGS: CT HEAD FINDINGS Brain: No intracranial hemorrhage or CT evidence of large acute infarct. Prominent chronic microvascular changes. Atrophy No intracranial mass lesion noted on this unenhanced exam. Vascular: Vascular calcifications Skull: No skull fracture Other: Prominent hematoma left frontal/supraorbital region. CT MAXILLOFACIAL FINDINGS Osseous: Nondisplaced fracture anterior and possibly posterior aspect of the left maxillary sinus. Orbits: Orbital structures appear grossly intact. Sinuses: Fluid level within the left maxillary sinus may represent result of small hemorrhage. Soft tissues: Large left frontal/supraorbital hematoma. CT CERVICAL SPINE FINDINGS Alignment: No malalignment. Skull base and vertebrae: No cervical spine fracture. Soft tissues and spinal canal: No abnormal prevertebral soft tissue swelling. Disc levels:  Cervical spondylotic changes similar to prior exam. Upper chest: No worrisome abnormality. Other: No worrisome abnormality. IMPRESSION: CT HEAD Prominent hematoma left frontal/supraorbital region. No skull fracture or intracranial hemorrhage. Prominent chronic microvascular changes. Atrophy CT MAXILLOFACIAL Nondisplaced fracture anterior and possibly posterior wall of the left maxillary sinus. Fluid level within the left maxillary sinus may represent result of small hemorrhage. CT CERVICAL SPINE No cervical spine fracture or malalignment. No  abnormal prevertebral soft tissue swelling. Results discussed with Roselyn Reef Ward physician's assistant at the time of imaging. Electronically Signed   By: Genia Del M.D.   On: 09/11/2017 18:15   Ct Head Wo Contrast  Result Date: 08/28/2017 CLINICAL DATA:  Mechanical fall hitting head. Patient on blood thinners. EXAM: CT HEAD WITHOUT CONTRAST CT CERVICAL SPINE WITHOUT CONTRAST TECHNIQUE: Multidetector CT imaging of the head and cervical spine was performed following the standard protocol without intravenous contrast. Multiplanar CT image reconstructions of the cervical spine were also generated. COMPARISON:  None. FINDINGS: CT HEAD FINDINGS Brain: Mild-to-moderate small vessel ischemia in the centra semiovale and periventricular white matter. No acute intracranial hemorrhage, midline shift or edema. No large vascular territory infarct. No intra-axial mass nor extra-axial fluid collections. Vascular: No hyperdense vessel sign. Skull: No skull fracture. Sinuses/Orbits: Mild ethmoid and frontal sinus mucosal thickening. No air-fluid levels. Clear mastoids. Intact orbits and globes. Small skin lesion of left eyelid. Other: None CT CERVICAL SPINE FINDINGS Alignment: Accentuated cervical lordosis. Intact atlantodental interval craniocervical relationship. Skull base and vertebrae: No acute fracture nor suspicious osseous lesions. Soft tissues and spinal canal: No prevertebral fluid or swelling. No visible canal hematoma. Disc levels: No central canal or significant neural foraminal encroachment. Multilevel degenerative facet arthropathy with joint space narrowing and hypertrophy. Mild central disc bulge at C3-4 and C4-5 Upper chest: Negative. Other: None IMPRESSION: 1. No acute intracranial abnormality. Chronic appearing small vessel ischemic disease periventricular and deep white matter. 2. No acute cervical spine fracture or posttraumatic listhesis. Electronically Signed   By: Ashley Royalty M.D.   On: 08/28/2017 01:37    Ct Cervical  Spine Wo Contrast  Result Date: 09/11/2017 CLINICAL DATA:  84 year old female post fall in kitchen. Denies loss of consciousness. Initial encounter. EXAM: CT HEAD WITHOUT CONTRAST CT MAXILLOFACIAL WITHOUT CONTRAST CT CERVICAL SPINE WITHOUT CONTRAST TECHNIQUE: Multidetector CT imaging of the head, cervical spine, and maxillofacial structures were performed using the standard protocol without intravenous contrast. Multiplanar CT image reconstructions of the cervical spine and maxillofacial structures were also generated. COMPARISON:  08/28/2017 head CT and cervical spine CT. FINDINGS: CT HEAD FINDINGS Brain: No intracranial hemorrhage or CT evidence of large acute infarct. Prominent chronic microvascular changes. Atrophy No intracranial mass lesion noted on this unenhanced exam. Vascular: Vascular calcifications Skull: No skull fracture Other: Prominent hematoma left frontal/supraorbital region. CT MAXILLOFACIAL FINDINGS Osseous: Nondisplaced fracture anterior and possibly posterior aspect of the left maxillary sinus. Orbits: Orbital structures appear grossly intact. Sinuses: Fluid level within the left maxillary sinus may represent result of small hemorrhage. Soft tissues: Large left frontal/supraorbital hematoma. CT CERVICAL SPINE FINDINGS Alignment: No malalignment. Skull base and vertebrae: No cervical spine fracture. Soft tissues and spinal canal: No abnormal prevertebral soft tissue swelling. Disc levels:  Cervical spondylotic changes similar to prior exam. Upper chest: No worrisome abnormality. Other: No worrisome abnormality. IMPRESSION: CT HEAD Prominent hematoma left frontal/supraorbital region. No skull fracture or intracranial hemorrhage. Prominent chronic microvascular changes. Atrophy CT MAXILLOFACIAL Nondisplaced fracture anterior and possibly posterior wall of the left maxillary sinus. Fluid level within the left maxillary sinus may represent result of small hemorrhage. CT CERVICAL  SPINE No cervical spine fracture or malalignment. No abnormal prevertebral soft tissue swelling. Results discussed with Roselyn Reef Ward physician's assistant at the time of imaging. Electronically Signed   By: Genia Del M.D.   On: 09/11/2017 18:15   Ct Cervical Spine Wo Contrast  Result Date: 08/28/2017 CLINICAL DATA:  Mechanical fall hitting head. Patient on blood thinners. EXAM: CT HEAD WITHOUT CONTRAST CT CERVICAL SPINE WITHOUT CONTRAST TECHNIQUE: Multidetector CT imaging of the head and cervical spine was performed following the standard protocol without intravenous contrast. Multiplanar CT image reconstructions of the cervical spine were also generated. COMPARISON:  None. FINDINGS: CT HEAD FINDINGS Brain: Mild-to-moderate small vessel ischemia in the centra semiovale and periventricular white matter. No acute intracranial hemorrhage, midline shift or edema. No large vascular territory infarct. No intra-axial mass nor extra-axial fluid collections. Vascular: No hyperdense vessel sign. Skull: No skull fracture. Sinuses/Orbits: Mild ethmoid and frontal sinus mucosal thickening. No air-fluid levels. Clear mastoids. Intact orbits and globes. Small skin lesion of left eyelid. Other: None CT CERVICAL SPINE FINDINGS Alignment: Accentuated cervical lordosis. Intact atlantodental interval craniocervical relationship. Skull base and vertebrae: No acute fracture nor suspicious osseous lesions. Soft tissues and spinal canal: No prevertebral fluid or swelling. No visible canal hematoma. Disc levels: No central canal or significant neural foraminal encroachment. Multilevel degenerative facet arthropathy with joint space narrowing and hypertrophy. Mild central disc bulge at C3-4 and C4-5 Upper chest: Negative. Other: None IMPRESSION: 1. No acute intracranial abnormality. Chronic appearing small vessel ischemic disease periventricular and deep white matter. 2. No acute cervical spine fracture or posttraumatic listhesis.  Electronically Signed   By: Ashley Royalty M.D.   On: 08/28/2017 01:37   Dg Shoulder Left  Result Date: 09/12/2017 CLINICAL DATA:  Fracture EXAM: LEFT SHOULDER - 2+ VIEW COMPARISON:  09/11/2017 FINDINGS: Fracture dislocation again noted of the left humerus. Displaced left humeral neck fracture again noted, unchanged. Anterior/inferior dislocation of the humeral head. IMPRESSION: No significant change in the displaced left humeral neck  fracture or dislocation. Electronically Signed   By: Rolm Baptise M.D.   On: 09/12/2017 10:39   Dg Shoulder Left  Result Date: 09/11/2017 CLINICAL DATA:  Post reduction EXAM: LEFT SHOULDER - 2+ VIEW COMPARISON:  09/11/2017, 08/27/2017 FINDINGS: AC joint is intact. Persistent inferior and anterior dislocation of the humeral head with respect to the glenoid fossa, increased compared to the most recent prior radiograph with the superior aspect of the humeral head positioned at the level of the inferior glenoid. Acute slightly comminuted proximal humerus fracture with close to 1 bone with of displacement of distal fracture fragment away from midline. Decreased angulation. IMPRESSION: 1. Decreased angulation of proximal humerus fracture but with residual close to 1 bone with of displacement of distal fracture fragment. 2. Persistent inferior and anterior dislocation of the humeral head with respect to the glenoid fossa, increased compared to the image performed earlier today. Electronically Signed   By: Donavan Foil M.D.   On: 09/11/2017 22:50   Dg Shoulder Left  Result Date: 09/11/2017 CLINICAL DATA:  Left shoulder pain following a fall today. Left humeral neck fracture 2 weeks ago. EXAM: LEFT SHOULDER - 2+ VIEW COMPARISON:  08/27/2017. FINDINGS: The previously demonstrated left humeral neck fracture is again demonstrated with interval 1/2 shaft width of lateral displacement and medial angulation of the distal fragment. There is also increased overlapping of the fragments. There  is also interval anterior dislocation of the humeral head relative to the glenoid. No new fractures are seen. IMPRESSION: Increased displacement and angulation of the previously demonstrated left humeral neck fracture with interval anterior dislocation of the humeral head. Electronically Signed   By: Claudie Revering M.D.   On: 09/11/2017 17:41   Dg Shoulder Left  Result Date: 08/27/2017 CLINICAL DATA:  84 year old female with fall and left shoulder pain. EXAM: LEFT SHOULDER - 2+ VIEW COMPARISON:  None. FINDINGS: There is a mildly displaced oblique fracture of the left humeral neck. The humeral head remains in anatomic alignment with the glenoid. The bones are osteopenic. There is degenerative changes of the left shoulder. The soft tissues appear unremarkable. IMPRESSION: Mildly displaced fracture of the left humeral neck.  No dislocation. Electronically Signed   By: Anner Crete M.D.   On: 08/27/2017 23:26   Dg Shoulder Left Port  Result Date: 09/13/2017 CLINICAL DATA:  Evaluate surgical pair of the proximal left humerus. EXAM: LEFT SHOULDER - 1 VIEW COMPARISON:  09/12/2017 FINDINGS: Plate and screw fixation of the displaced proximal humeral fracture. Left shoulder is located. Degenerative changes at the left [facility] joint. Normal alignment of left AC joint. IMPRESSION: Surgical fixation of the displaced left proximal humeral fracture. Electronically Signed   By: Markus Daft M.D.   On: 09/13/2017 09:37   Dg Shoulder Left Port  Result Date: 09/12/2017 CLINICAL DATA:  Status post internal fixation of left humeral fracture. EXAM: LEFT SHOULDER - 1 VIEW COMPARISON:  Left shoulder radiographs performed earlier today at 10:16 a.m. FINDINGS: The patient is status post internal fixation of the comminuted left humeral fracture, with mild residual displacement. The left humeral head remains seated at the glenoid fossa. Minimal degenerative change is noted at the acromioclavicular joint. The visualized portions of the  left lung are clear. Soft tissue swelling is noted about the proximal left arm. IMPRESSION: Status post internal fixation of comminuted left humeral fracture, with mild residual displacement. Electronically Signed   By: Garald Balding M.D.   On: 09/12/2017 21:47   Dg Humerus Left  Result Date:  09/12/2017 CLINICAL DATA:  Left shoulder fracture EXAM: LEFT HUMERUS - 2+ VIEW; DG C-ARM 61-120 MIN COMPARISON:  09/12/2017 FINDINGS: Eleven low resolution intraoperative spot views of the left humerus. Total fluoroscopy time was 45 seconds. Images were obtained during operative course of surgical plate and multiple screw fixation of proximal humerus fracture. There is decreased angulation and displacement. There are displaced fracture fragments noted both anterior and posterior on the final image. IMPRESSION: Intraoperative fluoroscopic assistance provided during surgical fixation of proximal left humerus fracture Electronically Signed   By: Donavan Foil M.D.   On: 09/12/2017 21:11   Dg C-arm 1-60 Min  Result Date: 09/12/2017 CLINICAL DATA:  Left shoulder fracture EXAM: LEFT HUMERUS - 2+ VIEW; DG C-ARM 61-120 MIN COMPARISON:  09/12/2017 FINDINGS: Eleven low resolution intraoperative spot views of the left humerus. Total fluoroscopy time was 45 seconds. Images were obtained during operative course of surgical plate and multiple screw fixation of proximal humerus fracture. There is decreased angulation and displacement. There are displaced fracture fragments noted both anterior and posterior on the final image. IMPRESSION: Intraoperative fluoroscopic assistance provided during surgical fixation of proximal left humerus fracture Electronically Signed   By: Donavan Foil M.D.   On: 09/12/2017 21:11   Dg Hip Unilat W Or Wo Pelvis 2-3 Views Left  Result Date: 09/11/2017 CLINICAL DATA:  Left hip pain following a fall in the kitchen at home today. EXAM: DG HIP (WITH OR WITHOUT PELVIS) 2-3V LEFT COMPARISON:  None.  FINDINGS: Normal appearing left hip without fracture or dislocation. Lower lumbar spine degenerative changes. IMPRESSION: No hip fracture or dislocation seen. Lower lumbar spine degenerative changes. Electronically Signed   By: Claudie Revering M.D.   On: 09/11/2017 17:42   Ct Maxillofacial Wo Contrast  Result Date: 09/11/2017 CLINICAL DATA:  84 year old female post fall in kitchen. Denies loss of consciousness. Initial encounter. EXAM: CT HEAD WITHOUT CONTRAST CT MAXILLOFACIAL WITHOUT CONTRAST CT CERVICAL SPINE WITHOUT CONTRAST TECHNIQUE: Multidetector CT imaging of the head, cervical spine, and maxillofacial structures were performed using the standard protocol without intravenous contrast. Multiplanar CT image reconstructions of the cervical spine and maxillofacial structures were also generated. COMPARISON:  08/28/2017 head CT and cervical spine CT. FINDINGS: CT HEAD FINDINGS Brain: No intracranial hemorrhage or CT evidence of large acute infarct. Prominent chronic microvascular changes. Atrophy No intracranial mass lesion noted on this unenhanced exam. Vascular: Vascular calcifications Skull: No skull fracture Other: Prominent hematoma left frontal/supraorbital region. CT MAXILLOFACIAL FINDINGS Osseous: Nondisplaced fracture anterior and possibly posterior aspect of the left maxillary sinus. Orbits: Orbital structures appear grossly intact. Sinuses: Fluid level within the left maxillary sinus may represent result of small hemorrhage. Soft tissues: Large left frontal/supraorbital hematoma. CT CERVICAL SPINE FINDINGS Alignment: No malalignment. Skull base and vertebrae: No cervical spine fracture. Soft tissues and spinal canal: No abnormal prevertebral soft tissue swelling. Disc levels:  Cervical spondylotic changes similar to prior exam. Upper chest: No worrisome abnormality. Other: No worrisome abnormality. IMPRESSION: CT HEAD Prominent hematoma left frontal/supraorbital region. No skull fracture or  intracranial hemorrhage. Prominent chronic microvascular changes. Atrophy CT MAXILLOFACIAL Nondisplaced fracture anterior and possibly posterior wall of the left maxillary sinus. Fluid level within the left maxillary sinus may represent result of small hemorrhage. CT CERVICAL SPINE No cervical spine fracture or malalignment. No abnormal prevertebral soft tissue swelling. Results discussed with Roselyn Reef Ward physician's assistant at the time of imaging. Electronically Signed   By: Genia Del M.D.   On: 09/11/2017 18:15   Rory Percy, DO  09/14/2017, 7:31 AM PGY-1, [place] Intern pager: [phone], text pages welcome

## 2017-09-14 NOTE — Progress Notes (Addendum)
Occupational Therapy Treatment Patient Details Name: Becky Leach MRN: [id number] DOB: [birth year] Today's Date: 09/14/2017    History of present illness Becky Leach Becky Leach a 84 y.o.femalepresenting with DisplacedL humeral head fracture&newLanterior shoulder dislocation and maxillary sinus fracture s/p fall. PMH is significant forHTN, spinal stenosis, OA, h/o PE 04/2017, CKD III.   OT comments  Pt progressing towards goals; completing room level functional mobility using hemiwalker with MinA this session. Pt requiring MaxA for UB and LB ADLs, with education provided to both pt and son regarding safety and compensatory techniques for completing ADLs while adhering to shoulder precautions. Educated and reviewed HEP during session. Pt currently requiring verbal cues and assist to correctly perform shoulder PROM and pendulums; educated son and pt on proper technique with pt/pt son verbalizing understanding. Pt will return home with available assist from familyt; will benefit greatly from [facility] services to ensure safety during ADLs, shoulder HEP and mobility after return home. Pt anticipating d/c home later today.   Follow Up Recommendations  Home health OT;Supervision/Assistance - 24 hour    Equipment Recommendations  None recommended by OT          Precautions / Restrictions Precautions Precautions: Fall;Shoulder Type of Shoulder Precautions: Sling while mobilizing; NWB LUE; PROM L shoulder (forward flexion 0-90, abduction 0-60, external rotation 0-30); active or passive ROM L shoulder; no AROM L shoulder. Pt able to complete pendulums per PA.  Shoulder Interventions: Shoulder sling/immobilizer(for mobilization only) Precaution Comments: Pt educated concerning shoulder precautions and ADL.  Required Braces or Orthoses: Sling(for mobilization) Restrictions Weight Bearing Restrictions: Yes LUE Weight Bearing: Non weight bearing       Mobility Bed Mobility               General  bed mobility comments: OOB in chair on my arrival.   Transfers Overall transfer level: Needs assistance Equipment used: Hemi-walker Transfers: Sit to/from Stand Sit to Stand: Min guard         General transfer comment: MinGuard for safety; sit<>stand from recliner and BSC     Balance Overall balance assessment: Needs assistance Sitting-balance support: No upper extremity supported;Feet supported Sitting balance-Leahy Scale: Good     Standing balance support: Single extremity supported;During functional activity Standing balance-Leahy Scale: Fair Standing balance comment: able to static stand with close minguard for safety; reliant on UE support during mobility                            ADL either performed or assessed with clinical judgement   ADL Overall ADL's : Needs assistance/impaired     Grooming: Min guard;Standing;Wash/dry hands           Upper Body Dressing : Maximal assistance;Sitting Upper Body Dressing Details (indicate cue type and reason): donning overhead shirt and sling  Lower Body Dressing: Maximal assistance;Sit to/from stand Lower Body Dressing Details (indicate cue type and reason): donning pull ups and pants  Toilet Transfer: Minimal assistance;Ambulation;BSC Toilet Transfer Details (indicate cue type and reason): using hemiwalker  Toileting- Clothing Manipulation and Hygiene: Minimal assistance;Sit to/from stand Toileting - Clothing Manipulation Details (indicate cue type and reason): pt able to perform pericare in standing with close minguard for safety; assist for clothing management      Functional mobility during ADLs: Minimal assistance(hemiwalker ) General ADL Comments: pt having some difficulty completing HEP, requires max instruction and multimodal cues to correctly complete; educated son to have pt only complete AROM to e/w/h and dangle  vs pendulums initially until HHOT is able to practice and progress shoulder PROM movements;  educated pt/son on compensatory technqiues for completing ADLs                        Cognition Arousal/Alertness: Awake/alert Behavior During Therapy: WFL for tasks assessed/performed Overall Cognitive Status: Within Functional Limits for tasks assessed                                 General Comments: pt appears to be at her baseline, though having difficulty understanding instructions regarding UE exercise         Exercises Shoulder Exercises Pendulum Exercise: PROM;Standing Shoulder Flexion: PROM;Left;Seated;5 reps(approx 0-25*) Shoulder ABduction: Left;Seated;PROM;5 reps(approx 0-45*) Shoulder External Rotation: PROM;Left;10 reps;Seated(0-neutral this session) Elbow Flexion: AROM;Left;10 reps;Seated Elbow Extension: AROM;Left;10 reps;Seated Wrist Flexion: AROM;Left;10 reps;Seated Wrist Extension: AROM;Left;10 reps;Seated Digit Composite Flexion: AROM;Left;10 reps;Seated Composite Extension: AROM;Left;10 reps;Seated Hand Exercises Forearm Supination: AROM;5 reps;Seated;Left Forearm Pronation: AROM;5 reps;Left;Seated   Shoulder Instructions Shoulder Instructions Donning/doffing shirt without moving shoulder: Maximal assistance Method for sponge bathing under operated UE: Maximal assistance Donning/doffing sling/immobilizer: Maximal assistance Correct positioning of sling/immobilizer: Maximal assistance Pendulum exercises (written home exercise program): Moderate assistance ROM for elbow, wrist and digits of operated UE: Minimal assistance Sling wearing schedule (on at all times/off for ADL's): Minimal assistance Proper positioning of operated UE when showering: Minimal assistance Positioning of UE while sleeping: Minimal assistance       Pertinent Vitals/ Pain       Faces Pain Scale: Hurts little more Pain Location: L shoulder  Pain Descriptors / Indicators: Guarding                                                           Frequency  Min 3X/week        Progress Toward Goals  OT Goals(current goals can now be found in the care plan section)  Progress towards OT goals: Progressing toward goals  Acute Rehab OT Goals Patient Stated Goal: feel better OT Goal Formulation: With patient/family Time For Goal Achievement: 09/27/17 Potential to Achieve Goals: Good  Plan Discharge plan remains appropriate    Co-evaluation    PT/OT/SLP Co-Evaluation/Treatment: Yes Reason for Co-Treatment: For patient/therapist safety;To address functional/ADL transfers   OT goals addressed during session: ADL's and self-care;Proper use of Adaptive equipment and DME      AM-PAC PT "6 Clicks" Daily Activity     Outcome Measure   Help from another person eating meals?: A Little Help from another person taking care of personal grooming?: A Little Help from another person toileting, which includes using toliet, bedpan, or urinal?: A Lot Help from another person bathing (including washing, rinsing, drying)?: A Lot Help from another person to put on and taking off regular upper body clothing?: A Lot Help from another person to put on and taking off regular lower body clothing?: A Lot 6 Click Score: 14    End of Session Equipment Utilized During Treatment: Gait belt(sling )  OT Visit Diagnosis: Other abnormalities of gait and mobility (R26.89);Pain Pain - part of body: Shoulder   Activity Tolerance Patient tolerated treatment well   Patient Left in chair;with call bell/phone within reach;with chair alarm set  Nurse Communication Mobility status        Time: 0932-6712 OT Time Calculation (min): 46 min  Charges: OT General Charges $OT Visit: 1 Visit OT Treatments $Self Care/Home Management : 8-22 mins $Therapeutic Activity: 8-22 mins  Lou Cal, OT Pager [phone] 09/14/2017    Raymondo Band 09/14/2017, 5:59 PM

## 2017-09-14 NOTE — Plan of Care (Signed)
  Problem: Clinical Measurements: Goal: Postoperative complications will be avoided or minimized Outcome: Progressing   Problem: Activity: Goal: Risk for activity intolerance will decrease Outcome: Progressing   Problem: Safety: Goal: Ability to remain free from injury will improve Outcome: Progressing

## 2017-09-14 NOTE — Progress Notes (Signed)
Responded to [facility] to assist with AD.  Spoke with patient nurse regarding AD paper work.  Nurse will give patient AD form and will page Chaplain services when patient is ready.  Jaclynn Major, [place], [facility], Pager [phone]

## 2017-09-14 NOTE — Progress Notes (Signed)
Discharge home. Home discharge instruction given to Son.

## 2017-09-14 NOTE — Progress Notes (Signed)
Physical Therapy Treatment Patient Details Name: Becky Leach MRN: [id number] DOB: [birth year] Today's Date: 09/14/2017    History of Present Illness Lysa Livengood Austinis a 84 y.o.femalepresenting with DisplacedL humeral head fracture&newLanterior shoulder dislocation and maxillary sinus fracture s/p fall. PMH is significant forHTN, spinal stenosis, OA, h/o PE 04/2017, CKD III.    PT Comments    OT in room performing exercises with patient prior to PTA arrival.  OT was finishing exercises and PTA progressed patient to standing and ambulation with hemiwalker as she would be using this new device at home.  Pt educated on placement of hemiwalker and how to use it safely.     Follow Up Recommendations  Home health PT;Other (comment)(HHOT and HH aide)     Equipment Recommendations  Other (comment)(hemiwalker)    Recommendations for Other Services       Precautions / Restrictions Precautions Precautions: Fall;Shoulder Type of Shoulder Precautions: Sling while mobilizing; NWB LUE; PROM L shoulder (forward flexion 0-90, abduction 0-60, external rotation 0-30); active or passive ROM L shoulder; no AROM L shoulder. Pt able to complete pendulums per PA.  Shoulder Interventions: Shoulder sling/immobilizer(for mobilization only) Precaution Comments: Pt educated concerning shoulder precautions and ADL.  Required Braces or Orthoses: Sling(for mobilization) Restrictions Weight Bearing Restrictions: Yes LUE Weight Bearing: Non weight bearing    Mobility  Bed Mobility               General bed mobility comments: OOB in chair on my arrival.   Transfers Overall transfer level: Needs assistance Equipment used: Hemi-walker Transfers: Sit to/from Stand Sit to Stand: Min guard         General transfer comment: Cues for hand placement and use of hemi walker patient performed from recliner and BSC.    Ambulation/Gait Ambulation/Gait assistance: Min assist;Min guard Ambulation  Distance (Feet): 10 Feet(+ 20 ft.  Seated rest break to toilet.  ) Assistive device: Hemi-walker Gait Pattern/deviations: Decreased step length - right;Decreased step length - left     General Gait Details: Dependent on RUE support for balance with amb, responded well to hemiwalker.     Stairs Stairs: (pt reports she will be entering the back door where stairs are not present.  )           Wheelchair Mobility    Modified Rankin (Stroke Patients Only)       Balance Overall balance assessment: Needs assistance Sitting-balance support: No upper extremity supported;Feet supported Sitting balance-Leahy Scale: Good     Standing balance support: Single extremity supported;During functional activity Standing balance-Leahy Scale: Fair Standing balance comment: able to static stand with close minguard for safety; reliant on UE support during mobility                             Cognition Arousal/Alertness: Awake/alert Behavior During Therapy: WFL for tasks assessed/performed Overall Cognitive Status: Within Functional Limits for tasks assessed                                 General Comments: pt appears to be at her baseline, though having difficulty understanding instructions regarding UE exercise       Exercises Shoulder Exercises Pendulum Exercise: PROM;Standing Shoulder Flexion: PROM;Left;Seated;5 reps(approx 0-25*) Shoulder ABduction: Left;Seated;PROM;5 reps(approx 0-45*) Shoulder External Rotation: PROM;Left;10 reps;Seated(0-neutral this session) Elbow Flexion: AROM;Left;10 reps;Seated Elbow Extension: AROM;Left;10 reps;Seated Wrist Flexion: AROM;Left;10 reps;Seated Wrist Extension: AROM;Left;10 reps;Seated  Digit Composite Flexion: AROM;Left;10 reps;Seated Composite Extension: AROM;Left;10 reps;Seated Hand Exercises Forearm Supination: AROM;5 reps;Seated;Left Forearm Pronation: AROM;5 reps;Left;Seated Donning/doffing shirt without moving  shoulder: Maximal assistance Method for sponge bathing under operated UE: Maximal assistance Donning/doffing sling/immobilizer: Maximal assistance Correct positioning of sling/immobilizer: Maximal assistance Pendulum exercises (written home exercise program): Moderate assistance ROM for elbow, wrist and digits of operated UE: Minimal assistance Sling wearing schedule (on at all times/off for ADL's): Minimal assistance Proper positioning of operated UE when showering: Minimal assistance Positioning of UE while sleeping: Minimal assistance    General Comments        Pertinent Vitals/Pain Pain Assessment: Faces Faces Pain Scale: Hurts little more Pain Location: L shoulder  Pain Descriptors / Indicators: Guarding Pain Intervention(s): Monitored during session    Home Living                      Prior Function            PT Goals (current goals can now be found in the care plan section) Acute Rehab PT Goals Patient Stated Goal: feel better Potential to Achieve Goals: Good Progress towards PT goals: Progressing toward goals    Frequency    Min 3X/week      PT Plan Current plan remains appropriate    Co-evaluation PT/OT/SLP Co-Evaluation/Treatment: Yes Reason for Co-Treatment: Other (comment)(pt leaving to d/c home and time spent in co-treat format to allow adequate edcuation from both PT/OT standpoints.  )   OT goals addressed during session: ADL's and self-care;Proper use of Adaptive equipment and DME      AM-PAC PT "6 Clicks" Daily Activity  Outcome Measure  Difficulty turning over in bed (including adjusting bedclothes, sheets and blankets)?: A Lot Difficulty moving from lying on back to sitting on the side of the bed? : Unable Difficulty sitting down on and standing up from a chair with arms (e.g., wheelchair, bedside commode, etc,.)?: Unable Help needed moving to and from a bed to chair (including a wheelchair)?: A Little Help needed walking in  hospital room?: A Little Help needed climbing 3-5 steps with a railing? : A Lot 6 Click Score: 12    End of Session Equipment Utilized During Treatment: Gait belt Activity Tolerance: Patient tolerated treatment well Patient left: in chair;with call bell/phone within reach;Other (comment);with chair alarm set(awaiting d.c) Nurse Communication: Mobility status PT Visit Diagnosis: Unsteadiness on feet (R26.81);Other abnormalities of gait and mobility (R26.89);Repeated falls (R29.6);Pain Pain - Right/Left: Left Pain - part of body: Arm     Time: 1630-1700 PT Time Calculation (min) (ACUTE ONLY): 30 min  Charges:  $Gait Training: 8-22 mins                    G Codes:       Governor Rooks, PTA pager [phone]    Cristela Blue 09/14/2017, 6:38 PM

## 2017-09-14 NOTE — Progress Notes (Signed)
Orthopedic Trauma Service Progress Note   Patient ID: Becky Leach MRN: [id number] DOB/AGE: July 31, 1929 84 y.o.  Subjective:  Doing well Sitting up in chair eating lunch  States pain is controlled No specific complaints this afternoon   PT/OT notes reviewed   Review of Systems  Constitutional: Negative for chills and fever.  Cardiovascular: Negative for chest pain.  Neurological: Negative for tingling and sensory change.    Objective:   VITALS:   Vitals:   09/13/17 1400 09/13/17 1950 09/14/17 0516 09/14/17 1300  BP: 100/69 96/68 119/81 99/64  Pulse: (!) 103 (!) 116 89 (!) 108  Resp: 18 16 20 19   Temp: 98.4 F (36.9 C) 97.8 F (36.6 C) 98.5 F (36.9 C) 98.6 F (37 C)  TempSrc: Oral Oral Oral Oral  SpO2: 95% 97% 97% 97%  Weight:      Height:        Estimated body mass index is 20.14 kg/m as calculated from the following:   Height as of this encounter: 5\' 2"  (1.575 m).   Weight as of this encounter: 49.9 kg (110 lb 1.6 oz).   Intake/Output      04/17 0701 - 04/18 0700 04/18 0701 - 04/19 0700   P.O. 120 240   I.V. (mL/kg)     IV Piggyback     Total Intake(mL/kg) 120 (2.4) 240 (4.8)   Urine (mL/kg/hr) 200 (0.2)    Blood     Total Output 200    Net -80 +240        Urine Occurrence 3 x      LABS  Results for orders placed or performed during the hospital encounter of 09/11/17 (from the past 24 hour(s))  CBC     Status: Abnormal   Collection Time: 09/14/17  4:04 AM  Result Value Ref Range   WBC 7.5 4.0 - 10.5 K/uL   RBC 3.25 (L) 3.87 - 5.11 MIL/uL   Hemoglobin 9.5 (L) 12.0 - 15.0 g/dL   HCT 28.4 (L) 36.0 - 46.0 %   MCV 87.4 78.0 - 100.0 fL   MCH 29.2 26.0 - 34.0 pg   MCHC 33.5 30.0 - 36.0 g/dL   RDW 15.3 11.5 - 15.5 %   Platelets 313 150 - 400 K/uL  Ferritin     Status: None   Collection Time: 09/14/17  8:54 AM  Result Value Ref Range   Ferritin 129 11 - 307 ng/mL  Iron and TIBC     Status: Abnormal   Collection Time:  09/14/17  8:54 AM  Result Value Ref Range   Iron 36 28 - 170 ug/dL   TIBC 202 (L) 250 - 450 ug/dL   Saturation Ratios 18 10.4 - 31.8 %   UIBC 166 ug/dL     PHYSICAL EXAM:   Gen: awake, resting comfortably sitting upright in chair, NAD Lungs: breathing is unlabored Ext:       Left Upper Extremity   Dressing removed, incision looks great!!   Scant bloody drainage   New dressing of 4x4s and tape applied              Sling in place             Radial, ulnar, median nerve motor and sensory functions intact             Axillary nerve sensation intact             + radial pulse  Swelling controlled             Good elbow, forearm, wrist and hand ROM     Assessment/Plan: 2 Days Post-Op   Principal Problem:   Maxillary sinus fracture (HCC) Active Problems:   Benign essential HTN   Spinal stenosis of lumbar region   Unilateral primary osteoarthritis, left knee   Unilateral primary osteoarthritis, right knee   Impaired ambulation   Humeral fracture   Thrombocytosis (HCC)   Acute head trauma   Fall   At risk for hemorrhage associated with anticoagulation therapy   Fever   Frequent falls   Vitamin D deficiency   Closed fracture of left proximal humerus   Anti-infectives (From admission, onward)   Start     Dose/Rate Route Frequency Ordered Stop   09/13/17 0600  ceFAZolin (ANCEF) IVPB 2g/100 mL premix     2 g 200 mL/hr over 30 Minutes Intravenous On call to O.R. 09/12/17 1546 09/12/17 1700   09/12/17 2300  ceFAZolin (ANCEF) IVPB 1 g/50 mL premix    Note to Pharmacy:  Tolerated ancef in OR   1 g 100 mL/hr over 30 Minutes Intravenous Every 6 hours 09/12/17 2154 09/13/17 1036   09/12/17 1551  ceFAZolin (ANCEF) 2-4 GM/100ML-% IVPB    Note to Pharmacy:  Henrine Screws   : cabinet override      09/12/17 1551 09/13/17 0359    .  POD/HD#: 2 84 y/o RHD black female with recurrent falls with displacement of known L proximal humerus fracture and dislocation of  humeral head    -displaced L proximal humerus fracture and L humeral head pseudodislocation             NWB L upper extremity              Ok for shoulder pendulum and PROM of L shoulder                          AROM and PROM of L elbow, forearm, wrist and hand                         Sling only needs to be on when mobilizing o/w it may be off             Ice prn              Dressing changed today   Dressing changes as needed starting on 09/16/2017             follow up with ortho in 10-14 days      - Pain management:             Scheduled tylenol              ultram prn   - ABL anemia/Hemodynamics             Monitor  Appears stable    - Medical issues              Per medical service   - DVT/PE prophylaxis:            eliquis   Was on this PTA chronically    - ID:              periop abx completed              Tolerated ancef    - Metabolic Bone Disease:  Vitamin d deficiency    Supplement 4000-5000 IUs daily    Recheck in 12 weeks   Recommend outpt dexa in 4 weeks    - FEN/GI prophylaxis/Foley/Lines:             diet as tolerated    - Dispo:            ok for DC today   Follow up with ortho in 10-14 days     Jari Pigg, PA-C [facility] [phone] Levi Aland (C) 09/14/2017, 3:16 PM

## 2017-09-15 ENCOUNTER — Encounter (HOSPITAL_COMMUNITY): Payer: Self-pay | Admitting: Orthopedic Surgery

## 2017-09-17 ENCOUNTER — Encounter (HOSPITAL_COMMUNITY): Payer: Self-pay | Admitting: Orthopedic Surgery

## 2017-09-17 LAB — CULTURE, BLOOD (ROUTINE X 2)
Culture: NO GROWTH
Culture: NO GROWTH
Special Requests: ADEQUATE
Special Requests: ADEQUATE

## 2017-09-18 DIAGNOSIS — Z86711 Personal history of pulmonary embolism: Secondary | ICD-10-CM | POA: Diagnosis not present

## 2017-09-18 DIAGNOSIS — D696 Thrombocytopenia, unspecified: Secondary | ICD-10-CM | POA: Diagnosis not present

## 2017-09-18 DIAGNOSIS — M48061 Spinal stenosis, lumbar region without neurogenic claudication: Secondary | ICD-10-CM | POA: Diagnosis not present

## 2017-09-18 DIAGNOSIS — M170 Bilateral primary osteoarthritis of knee: Secondary | ICD-10-CM | POA: Diagnosis not present

## 2017-09-18 DIAGNOSIS — S42202D Unspecified fracture of upper end of left humerus, subsequent encounter for fracture with routine healing: Secondary | ICD-10-CM | POA: Diagnosis not present

## 2017-09-18 DIAGNOSIS — I10 Essential (primary) hypertension: Secondary | ICD-10-CM | POA: Diagnosis not present

## 2017-09-19 NOTE — Progress Notes (Signed)
Subjective   Patient ID: Becky Leach    DOB: [birth year], 84 y.o. female   MRN: [id number]  CC: " Hospital follow-up"  HPI: Becky Leach is a 84 y.o. female who presents to clinic today for the following:  Left humeral fracture: Patient sustained left humeral fracture requiring ORIF on 4/16.  Patient recently discharged following her mechanical fall.  She continues to have some tenderness near the incision site well controlled with tramadol use.  She is adherent to her swelling and has been following PT/OT recommendations.  She has a follow-up scheduled with her surgeon later next week.  Fall: Recently discharged from hospital for a mechanical fall in the kitchen resulting in a left humeral fracture.  Patient also sustained a fracture to her left maxillary sinus and hematoma superior to the left orbit.  Decision was made to conservatively treat hematoma and maxillary fracture.  Patient underwent work-up and was recommended for PT/OT home health along with home aide.  Patient endorses being contacted by the therapy is currently in the process of receiving them.  Vitamin D deficiency: Patient diagnosed with vitamin D deficiency during hospitalization following a fall.  She has since taken 2000 units of vitamin D daily as instructed by the pharmacist.  ROS: see HPI for pertinent.  [place]: Displaced left humeral head fracture, left maxillary sinus fracture, frequent falls, normocytic anemia, HTN, CKD 3, OA of knees, h/o PE (on Eliquis).  Surgical history ORIF of left humerus, D&C, T&A.  Family history arthritis, heart disease.  Smoking status reviewed. Medications reviewed.  Objective   BP 102/62   Pulse 99   Temp 98.4 F (36.9 C) (Oral)   Ht 5\' 2"  (1.575 m)   Wt 101 lb 12.8 oz (46.2 kg)   SpO2 98%   BMI 18.62 kg/m  Vitals and nursing note reviewed.  General: elderly woman with walker and a sling, well developed, NAD with non-toxic appearance HEENT: normocephalic, atraumatic, moist  mucous membranes, large hematoma to superior orbit, PERRLA, EOMI Neck: supple, non-tender without lymphadenopathy Cardiovascular: regular rate and rhythm without murmurs, rubs, or gallops Lungs: clear to auscultation bilaterally with normal work of breathing Skin: warm, dry, no rashes or lesions, cap refill < 2 seconds, well-healed scar from incision on left anterior surface of shoulder Extremities: warm and well perfused, normal tone, no edema, neurovascular intact on left distal arm, 5/5 motor strength on upper extremities bilaterally  Assessment & Plan   Fall Acute on chronic.  Likely mechanical related to an underlying ability to gait.  Patient currently adherent to walker and PT/OT. - Continue PT/OT - Patient and son would like to defer DEXA scan until next appointment in 6 weeks  Vitamin D deficiency Vitamin D 19.7.  Currently on supplement therapy.  Has history of multiple falls. - Recommending DEXA scan given age and history of falls with known vitamin D deficiency - Continue cholecalciferol 2000 units daily  Humeral fracture Acute.  Stable.  Healing well on exam.  Patient has schedule appointment with surgeon, Dr. Marcelino Scot. - Advised patient to follow-up with orthopedic surgeon as scheduled  No orders of the defined types were placed in this encounter.  Meds ordered this encounter  Medications  . traMADol (ULTRAM) 50 MG tablet    Sig: Take 0.5 tablets (25 mg total) by mouth every 6 (six) hours as needed for severe pain.    Dispense:  20 tablet    Refill:  0    Harriet Butte, DO [facility], PGY-2 09/20/2017, 5:03 PM

## 2017-09-20 ENCOUNTER — Encounter: Payer: Self-pay | Admitting: Family Medicine

## 2017-09-20 ENCOUNTER — Other Ambulatory Visit: Payer: Self-pay

## 2017-09-20 ENCOUNTER — Ambulatory Visit (INDEPENDENT_AMBULATORY_CARE_PROVIDER_SITE_OTHER): Payer: Medicare Other | Admitting: Family Medicine

## 2017-09-20 VITALS — BP 102/62 | HR 99 | Temp 98.4°F | Ht 62.0 in | Wt 101.8 lb

## 2017-09-20 DIAGNOSIS — S42292A Other displaced fracture of upper end of left humerus, initial encounter for closed fracture: Secondary | ICD-10-CM

## 2017-09-20 DIAGNOSIS — E559 Vitamin D deficiency, unspecified: Secondary | ICD-10-CM | POA: Diagnosis not present

## 2017-09-20 DIAGNOSIS — W19XXXD Unspecified fall, subsequent encounter: Secondary | ICD-10-CM

## 2017-09-20 MED ORDER — TRAMADOL HCL 50 MG PO TABS: 25.0000 mg | ORAL_TABLET | Freq: Four times a day (QID) | ORAL | 0 refills | Status: DC | PRN

## 2017-09-20 NOTE — Patient Instructions (Signed)
Thank you for coming in to see Becky Leach today. Please see below to review our plan for today's visit.  Continue the recommendations with the physical and occupational therapist.  Let Becky Leach know if you have issues with your therapy or getting a home aide.  I would like to see you again in 6 weeks to discuss DEXA scan and recheck vitamin D levels.  Follow-up with your surgeon and let Becky Leach know if you have any questions.  Bring your healthcare power of attorney paperwork during your next appointment.  I sent a refill of your tramadol to your pharmacy.  Take as needed.  Please call the clinic at [phone] if your symptoms worsen or you have any concerns. It was our pleasure to serve you.  Harriet [place], [place], PGY-2

## 2017-09-20 NOTE — Assessment & Plan Note (Addendum)
Vitamin D 19.7.  Currently on supplement therapy.  Has history of multiple falls. - Recommending DEXA scan given age and history of falls with known vitamin D deficiency - Continue cholecalciferol 2000 units daily

## 2017-09-20 NOTE — Assessment & Plan Note (Addendum)
Acute on chronic.  Likely mechanical related to an underlying ability to gait.  Patient currently adherent to walker and PT/OT. - Continue PT/OT - Patient and son would like to defer DEXA scan until next appointment in 6 weeks

## 2017-09-20 NOTE — Assessment & Plan Note (Signed)
Acute.  Stable.  Healing well on exam.  Patient has schedule appointment with surgeon, Dr. Marcelino Scot. - Advised patient to follow-up with orthopedic surgeon as scheduled

## 2017-09-22 DIAGNOSIS — S42202D Unspecified fracture of upper end of left humerus, subsequent encounter for fracture with routine healing: Secondary | ICD-10-CM | POA: Diagnosis not present

## 2017-09-22 DIAGNOSIS — D696 Thrombocytopenia, unspecified: Secondary | ICD-10-CM | POA: Diagnosis not present

## 2017-09-22 DIAGNOSIS — M170 Bilateral primary osteoarthritis of knee: Secondary | ICD-10-CM | POA: Diagnosis not present

## 2017-09-22 DIAGNOSIS — M48061 Spinal stenosis, lumbar region without neurogenic claudication: Secondary | ICD-10-CM | POA: Diagnosis not present

## 2017-09-22 DIAGNOSIS — Z86711 Personal history of pulmonary embolism: Secondary | ICD-10-CM | POA: Diagnosis not present

## 2017-09-22 DIAGNOSIS — I10 Essential (primary) hypertension: Secondary | ICD-10-CM | POA: Diagnosis not present

## 2017-09-25 DIAGNOSIS — D696 Thrombocytopenia, unspecified: Secondary | ICD-10-CM | POA: Diagnosis not present

## 2017-09-25 DIAGNOSIS — M170 Bilateral primary osteoarthritis of knee: Secondary | ICD-10-CM | POA: Diagnosis not present

## 2017-09-25 DIAGNOSIS — Z86711 Personal history of pulmonary embolism: Secondary | ICD-10-CM | POA: Diagnosis not present

## 2017-09-25 DIAGNOSIS — S42202D Unspecified fracture of upper end of left humerus, subsequent encounter for fracture with routine healing: Secondary | ICD-10-CM | POA: Diagnosis not present

## 2017-09-25 DIAGNOSIS — M48061 Spinal stenosis, lumbar region without neurogenic claudication: Secondary | ICD-10-CM | POA: Diagnosis not present

## 2017-09-25 DIAGNOSIS — I10 Essential (primary) hypertension: Secondary | ICD-10-CM | POA: Diagnosis not present

## 2017-09-25 NOTE — Telephone Encounter (Signed)
Reviewed form requesting assistance with ADL and placed in provider's (Dr. Reesa Chew) box. Ozella Almond, [place]

## 2017-09-26 DIAGNOSIS — D696 Thrombocytopenia, unspecified: Secondary | ICD-10-CM | POA: Diagnosis not present

## 2017-09-26 DIAGNOSIS — Z86711 Personal history of pulmonary embolism: Secondary | ICD-10-CM | POA: Diagnosis not present

## 2017-09-26 DIAGNOSIS — I10 Essential (primary) hypertension: Secondary | ICD-10-CM | POA: Diagnosis not present

## 2017-09-26 DIAGNOSIS — S42202D Unspecified fracture of upper end of left humerus, subsequent encounter for fracture with routine healing: Secondary | ICD-10-CM | POA: Diagnosis not present

## 2017-09-26 DIAGNOSIS — M48061 Spinal stenosis, lumbar region without neurogenic claudication: Secondary | ICD-10-CM | POA: Diagnosis not present

## 2017-09-26 DIAGNOSIS — M170 Bilateral primary osteoarthritis of knee: Secondary | ICD-10-CM | POA: Diagnosis not present

## 2017-09-27 DIAGNOSIS — M48061 Spinal stenosis, lumbar region without neurogenic claudication: Secondary | ICD-10-CM | POA: Diagnosis not present

## 2017-09-27 DIAGNOSIS — Z86711 Personal history of pulmonary embolism: Secondary | ICD-10-CM | POA: Diagnosis not present

## 2017-09-27 DIAGNOSIS — S42202D Unspecified fracture of upper end of left humerus, subsequent encounter for fracture with routine healing: Secondary | ICD-10-CM | POA: Diagnosis not present

## 2017-09-27 DIAGNOSIS — M170 Bilateral primary osteoarthritis of knee: Secondary | ICD-10-CM | POA: Diagnosis not present

## 2017-09-27 DIAGNOSIS — D696 Thrombocytopenia, unspecified: Secondary | ICD-10-CM | POA: Diagnosis not present

## 2017-09-27 DIAGNOSIS — I10 Essential (primary) hypertension: Secondary | ICD-10-CM | POA: Diagnosis not present

## 2017-10-02 DIAGNOSIS — M170 Bilateral primary osteoarthritis of knee: Secondary | ICD-10-CM | POA: Diagnosis not present

## 2017-10-02 DIAGNOSIS — M48061 Spinal stenosis, lumbar region without neurogenic claudication: Secondary | ICD-10-CM | POA: Diagnosis not present

## 2017-10-02 DIAGNOSIS — Z86711 Personal history of pulmonary embolism: Secondary | ICD-10-CM | POA: Diagnosis not present

## 2017-10-02 DIAGNOSIS — I10 Essential (primary) hypertension: Secondary | ICD-10-CM | POA: Diagnosis not present

## 2017-10-02 DIAGNOSIS — S42202D Unspecified fracture of upper end of left humerus, subsequent encounter for fracture with routine healing: Secondary | ICD-10-CM | POA: Diagnosis not present

## 2017-10-02 DIAGNOSIS — D696 Thrombocytopenia, unspecified: Secondary | ICD-10-CM | POA: Diagnosis not present

## 2017-10-04 DIAGNOSIS — S42232D 3-part fracture of surgical neck of left humerus, subsequent encounter for fracture with routine healing: Secondary | ICD-10-CM | POA: Diagnosis not present

## 2017-10-04 NOTE — Telephone Encounter (Signed)
Have left form in fax pile.  Please inform pt, thanks

## 2017-10-05 DIAGNOSIS — M170 Bilateral primary osteoarthritis of knee: Secondary | ICD-10-CM | POA: Diagnosis not present

## 2017-10-05 DIAGNOSIS — M48061 Spinal stenosis, lumbar region without neurogenic claudication: Secondary | ICD-10-CM | POA: Diagnosis not present

## 2017-10-05 DIAGNOSIS — S42202D Unspecified fracture of upper end of left humerus, subsequent encounter for fracture with routine healing: Secondary | ICD-10-CM | POA: Diagnosis not present

## 2017-10-05 DIAGNOSIS — Z86711 Personal history of pulmonary embolism: Secondary | ICD-10-CM | POA: Diagnosis not present

## 2017-10-05 DIAGNOSIS — I10 Essential (primary) hypertension: Secondary | ICD-10-CM | POA: Diagnosis not present

## 2017-10-05 DIAGNOSIS — D696 Thrombocytopenia, unspecified: Secondary | ICD-10-CM | POA: Diagnosis not present

## 2017-10-05 NOTE — Telephone Encounter (Signed)
Pt son informed. Deseree Blount, CMA  

## 2017-10-06 ENCOUNTER — Other Ambulatory Visit: Payer: Self-pay | Admitting: Family Medicine

## 2017-10-06 DIAGNOSIS — I10 Essential (primary) hypertension: Secondary | ICD-10-CM | POA: Diagnosis not present

## 2017-10-06 DIAGNOSIS — S42202D Unspecified fracture of upper end of left humerus, subsequent encounter for fracture with routine healing: Secondary | ICD-10-CM | POA: Diagnosis not present

## 2017-10-06 DIAGNOSIS — D696 Thrombocytopenia, unspecified: Secondary | ICD-10-CM | POA: Diagnosis not present

## 2017-10-06 DIAGNOSIS — M48061 Spinal stenosis, lumbar region without neurogenic claudication: Secondary | ICD-10-CM | POA: Diagnosis not present

## 2017-10-06 DIAGNOSIS — M170 Bilateral primary osteoarthritis of knee: Secondary | ICD-10-CM | POA: Diagnosis not present

## 2017-10-06 DIAGNOSIS — Z86711 Personal history of pulmonary embolism: Secondary | ICD-10-CM | POA: Diagnosis not present

## 2017-10-09 DIAGNOSIS — M170 Bilateral primary osteoarthritis of knee: Secondary | ICD-10-CM | POA: Diagnosis not present

## 2017-10-09 DIAGNOSIS — M48061 Spinal stenosis, lumbar region without neurogenic claudication: Secondary | ICD-10-CM | POA: Diagnosis not present

## 2017-10-09 DIAGNOSIS — I10 Essential (primary) hypertension: Secondary | ICD-10-CM | POA: Diagnosis not present

## 2017-10-09 DIAGNOSIS — Z86711 Personal history of pulmonary embolism: Secondary | ICD-10-CM | POA: Diagnosis not present

## 2017-10-09 DIAGNOSIS — S42202D Unspecified fracture of upper end of left humerus, subsequent encounter for fracture with routine healing: Secondary | ICD-10-CM | POA: Diagnosis not present

## 2017-10-09 DIAGNOSIS — D696 Thrombocytopenia, unspecified: Secondary | ICD-10-CM | POA: Diagnosis not present

## 2017-10-10 DIAGNOSIS — D696 Thrombocytopenia, unspecified: Secondary | ICD-10-CM | POA: Diagnosis not present

## 2017-10-10 DIAGNOSIS — I10 Essential (primary) hypertension: Secondary | ICD-10-CM | POA: Diagnosis not present

## 2017-10-10 DIAGNOSIS — M48061 Spinal stenosis, lumbar region without neurogenic claudication: Secondary | ICD-10-CM | POA: Diagnosis not present

## 2017-10-10 DIAGNOSIS — S42202D Unspecified fracture of upper end of left humerus, subsequent encounter for fracture with routine healing: Secondary | ICD-10-CM | POA: Diagnosis not present

## 2017-10-10 DIAGNOSIS — Z86711 Personal history of pulmonary embolism: Secondary | ICD-10-CM | POA: Diagnosis not present

## 2017-10-10 DIAGNOSIS — M170 Bilateral primary osteoarthritis of knee: Secondary | ICD-10-CM | POA: Diagnosis not present

## 2017-10-12 DIAGNOSIS — I10 Essential (primary) hypertension: Secondary | ICD-10-CM | POA: Diagnosis not present

## 2017-10-12 DIAGNOSIS — D696 Thrombocytopenia, unspecified: Secondary | ICD-10-CM | POA: Diagnosis not present

## 2017-10-12 DIAGNOSIS — M170 Bilateral primary osteoarthritis of knee: Secondary | ICD-10-CM | POA: Diagnosis not present

## 2017-10-12 DIAGNOSIS — S42202D Unspecified fracture of upper end of left humerus, subsequent encounter for fracture with routine healing: Secondary | ICD-10-CM | POA: Diagnosis not present

## 2017-10-12 DIAGNOSIS — M48061 Spinal stenosis, lumbar region without neurogenic claudication: Secondary | ICD-10-CM | POA: Diagnosis not present

## 2017-10-12 DIAGNOSIS — Z86711 Personal history of pulmonary embolism: Secondary | ICD-10-CM | POA: Diagnosis not present

## 2017-10-16 DIAGNOSIS — Z86711 Personal history of pulmonary embolism: Secondary | ICD-10-CM | POA: Diagnosis not present

## 2017-10-16 DIAGNOSIS — M170 Bilateral primary osteoarthritis of knee: Secondary | ICD-10-CM | POA: Diagnosis not present

## 2017-10-16 DIAGNOSIS — S42202D Unspecified fracture of upper end of left humerus, subsequent encounter for fracture with routine healing: Secondary | ICD-10-CM | POA: Diagnosis not present

## 2017-10-16 DIAGNOSIS — M48061 Spinal stenosis, lumbar region without neurogenic claudication: Secondary | ICD-10-CM | POA: Diagnosis not present

## 2017-10-16 DIAGNOSIS — I10 Essential (primary) hypertension: Secondary | ICD-10-CM | POA: Diagnosis not present

## 2017-10-16 DIAGNOSIS — D696 Thrombocytopenia, unspecified: Secondary | ICD-10-CM | POA: Diagnosis not present

## 2017-10-18 DIAGNOSIS — I10 Essential (primary) hypertension: Secondary | ICD-10-CM | POA: Diagnosis not present

## 2017-10-18 DIAGNOSIS — Z86711 Personal history of pulmonary embolism: Secondary | ICD-10-CM | POA: Diagnosis not present

## 2017-10-18 DIAGNOSIS — D696 Thrombocytopenia, unspecified: Secondary | ICD-10-CM | POA: Diagnosis not present

## 2017-10-18 DIAGNOSIS — S42202D Unspecified fracture of upper end of left humerus, subsequent encounter for fracture with routine healing: Secondary | ICD-10-CM | POA: Diagnosis not present

## 2017-10-18 DIAGNOSIS — M48061 Spinal stenosis, lumbar region without neurogenic claudication: Secondary | ICD-10-CM | POA: Diagnosis not present

## 2017-10-18 DIAGNOSIS — M170 Bilateral primary osteoarthritis of knee: Secondary | ICD-10-CM | POA: Diagnosis not present

## 2017-10-23 DIAGNOSIS — S42202D Unspecified fracture of upper end of left humerus, subsequent encounter for fracture with routine healing: Secondary | ICD-10-CM | POA: Diagnosis not present

## 2017-10-23 DIAGNOSIS — I10 Essential (primary) hypertension: Secondary | ICD-10-CM | POA: Diagnosis not present

## 2017-10-23 DIAGNOSIS — D696 Thrombocytopenia, unspecified: Secondary | ICD-10-CM | POA: Diagnosis not present

## 2017-10-23 DIAGNOSIS — Z86711 Personal history of pulmonary embolism: Secondary | ICD-10-CM | POA: Diagnosis not present

## 2017-10-23 DIAGNOSIS — M48061 Spinal stenosis, lumbar region without neurogenic claudication: Secondary | ICD-10-CM | POA: Diagnosis not present

## 2017-10-23 DIAGNOSIS — M170 Bilateral primary osteoarthritis of knee: Secondary | ICD-10-CM | POA: Diagnosis not present

## 2017-10-25 DIAGNOSIS — Z86711 Personal history of pulmonary embolism: Secondary | ICD-10-CM | POA: Diagnosis not present

## 2017-10-25 DIAGNOSIS — S42202D Unspecified fracture of upper end of left humerus, subsequent encounter for fracture with routine healing: Secondary | ICD-10-CM | POA: Diagnosis not present

## 2017-10-25 DIAGNOSIS — I10 Essential (primary) hypertension: Secondary | ICD-10-CM | POA: Diagnosis not present

## 2017-10-25 DIAGNOSIS — M48061 Spinal stenosis, lumbar region without neurogenic claudication: Secondary | ICD-10-CM | POA: Diagnosis not present

## 2017-10-25 DIAGNOSIS — D696 Thrombocytopenia, unspecified: Secondary | ICD-10-CM | POA: Diagnosis not present

## 2017-10-25 DIAGNOSIS — M170 Bilateral primary osteoarthritis of knee: Secondary | ICD-10-CM | POA: Diagnosis not present

## 2017-10-27 DIAGNOSIS — Z86711 Personal history of pulmonary embolism: Secondary | ICD-10-CM | POA: Diagnosis not present

## 2017-10-27 DIAGNOSIS — S42202D Unspecified fracture of upper end of left humerus, subsequent encounter for fracture with routine healing: Secondary | ICD-10-CM | POA: Diagnosis not present

## 2017-10-27 DIAGNOSIS — M170 Bilateral primary osteoarthritis of knee: Secondary | ICD-10-CM | POA: Diagnosis not present

## 2017-10-27 DIAGNOSIS — D696 Thrombocytopenia, unspecified: Secondary | ICD-10-CM | POA: Diagnosis not present

## 2017-10-27 DIAGNOSIS — I10 Essential (primary) hypertension: Secondary | ICD-10-CM | POA: Diagnosis not present

## 2017-10-27 DIAGNOSIS — M48061 Spinal stenosis, lumbar region without neurogenic claudication: Secondary | ICD-10-CM | POA: Diagnosis not present

## 2017-10-31 DIAGNOSIS — D696 Thrombocytopenia, unspecified: Secondary | ICD-10-CM | POA: Diagnosis not present

## 2017-10-31 DIAGNOSIS — M170 Bilateral primary osteoarthritis of knee: Secondary | ICD-10-CM | POA: Diagnosis not present

## 2017-10-31 DIAGNOSIS — M48061 Spinal stenosis, lumbar region without neurogenic claudication: Secondary | ICD-10-CM | POA: Diagnosis not present

## 2017-10-31 DIAGNOSIS — I10 Essential (primary) hypertension: Secondary | ICD-10-CM | POA: Diagnosis not present

## 2017-10-31 DIAGNOSIS — S42202D Unspecified fracture of upper end of left humerus, subsequent encounter for fracture with routine healing: Secondary | ICD-10-CM | POA: Diagnosis not present

## 2017-10-31 DIAGNOSIS — Z86711 Personal history of pulmonary embolism: Secondary | ICD-10-CM | POA: Diagnosis not present

## 2017-11-01 DIAGNOSIS — S42232D 3-part fracture of surgical neck of left humerus, subsequent encounter for fracture with routine healing: Secondary | ICD-10-CM | POA: Diagnosis not present

## 2017-11-02 DIAGNOSIS — D696 Thrombocytopenia, unspecified: Secondary | ICD-10-CM | POA: Diagnosis not present

## 2017-11-02 DIAGNOSIS — M170 Bilateral primary osteoarthritis of knee: Secondary | ICD-10-CM | POA: Diagnosis not present

## 2017-11-02 DIAGNOSIS — Z86711 Personal history of pulmonary embolism: Secondary | ICD-10-CM | POA: Diagnosis not present

## 2017-11-02 DIAGNOSIS — S42202D Unspecified fracture of upper end of left humerus, subsequent encounter for fracture with routine healing: Secondary | ICD-10-CM | POA: Diagnosis not present

## 2017-11-02 DIAGNOSIS — I10 Essential (primary) hypertension: Secondary | ICD-10-CM | POA: Diagnosis not present

## 2017-11-02 DIAGNOSIS — M48061 Spinal stenosis, lumbar region without neurogenic claudication: Secondary | ICD-10-CM | POA: Diagnosis not present

## 2017-11-04 DIAGNOSIS — S02401D Maxillary fracture, unspecified, subsequent encounter for fracture with routine healing: Secondary | ICD-10-CM | POA: Diagnosis not present

## 2017-11-04 DIAGNOSIS — Z86711 Personal history of pulmonary embolism: Secondary | ICD-10-CM | POA: Diagnosis not present

## 2017-11-04 DIAGNOSIS — S42202D Unspecified fracture of upper end of left humerus, subsequent encounter for fracture with routine healing: Secondary | ICD-10-CM | POA: Diagnosis not present

## 2017-11-04 DIAGNOSIS — D696 Thrombocytopenia, unspecified: Secondary | ICD-10-CM | POA: Diagnosis not present

## 2017-11-04 DIAGNOSIS — N183 Chronic kidney disease, stage 3 (moderate): Secondary | ICD-10-CM | POA: Diagnosis not present

## 2017-11-04 DIAGNOSIS — S43012D Anterior subluxation of left humerus, subsequent encounter: Secondary | ICD-10-CM | POA: Diagnosis not present

## 2017-11-04 DIAGNOSIS — Z9181 History of falling: Secondary | ICD-10-CM | POA: Diagnosis not present

## 2017-11-04 DIAGNOSIS — M48061 Spinal stenosis, lumbar region without neurogenic claudication: Secondary | ICD-10-CM | POA: Diagnosis not present

## 2017-11-04 DIAGNOSIS — Z7901 Long term (current) use of anticoagulants: Secondary | ICD-10-CM | POA: Diagnosis not present

## 2017-11-04 DIAGNOSIS — D631 Anemia in chronic kidney disease: Secondary | ICD-10-CM | POA: Diagnosis not present

## 2017-11-04 DIAGNOSIS — M170 Bilateral primary osteoarthritis of knee: Secondary | ICD-10-CM | POA: Diagnosis not present

## 2017-11-04 DIAGNOSIS — I129 Hypertensive chronic kidney disease with stage 1 through stage 4 chronic kidney disease, or unspecified chronic kidney disease: Secondary | ICD-10-CM | POA: Diagnosis not present

## 2017-11-06 DIAGNOSIS — S42202D Unspecified fracture of upper end of left humerus, subsequent encounter for fracture with routine healing: Secondary | ICD-10-CM | POA: Diagnosis not present

## 2017-11-06 DIAGNOSIS — I129 Hypertensive chronic kidney disease with stage 1 through stage 4 chronic kidney disease, or unspecified chronic kidney disease: Secondary | ICD-10-CM | POA: Diagnosis not present

## 2017-11-06 DIAGNOSIS — M170 Bilateral primary osteoarthritis of knee: Secondary | ICD-10-CM | POA: Diagnosis not present

## 2017-11-06 DIAGNOSIS — N183 Chronic kidney disease, stage 3 (moderate): Secondary | ICD-10-CM | POA: Diagnosis not present

## 2017-11-06 DIAGNOSIS — S02401D Maxillary fracture, unspecified, subsequent encounter for fracture with routine healing: Secondary | ICD-10-CM | POA: Diagnosis not present

## 2017-11-06 DIAGNOSIS — S43012D Anterior subluxation of left humerus, subsequent encounter: Secondary | ICD-10-CM | POA: Diagnosis not present

## 2017-11-10 DIAGNOSIS — S02401D Maxillary fracture, unspecified, subsequent encounter for fracture with routine healing: Secondary | ICD-10-CM | POA: Diagnosis not present

## 2017-11-10 DIAGNOSIS — S43012D Anterior subluxation of left humerus, subsequent encounter: Secondary | ICD-10-CM | POA: Diagnosis not present

## 2017-11-10 DIAGNOSIS — M170 Bilateral primary osteoarthritis of knee: Secondary | ICD-10-CM | POA: Diagnosis not present

## 2017-11-10 DIAGNOSIS — N183 Chronic kidney disease, stage 3 (moderate): Secondary | ICD-10-CM | POA: Diagnosis not present

## 2017-11-10 DIAGNOSIS — I129 Hypertensive chronic kidney disease with stage 1 through stage 4 chronic kidney disease, or unspecified chronic kidney disease: Secondary | ICD-10-CM | POA: Diagnosis not present

## 2017-11-10 DIAGNOSIS — S42202D Unspecified fracture of upper end of left humerus, subsequent encounter for fracture with routine healing: Secondary | ICD-10-CM | POA: Diagnosis not present

## 2017-11-14 DIAGNOSIS — M170 Bilateral primary osteoarthritis of knee: Secondary | ICD-10-CM | POA: Diagnosis not present

## 2017-11-14 DIAGNOSIS — S02401D Maxillary fracture, unspecified, subsequent encounter for fracture with routine healing: Secondary | ICD-10-CM | POA: Diagnosis not present

## 2017-11-14 DIAGNOSIS — S43012D Anterior subluxation of left humerus, subsequent encounter: Secondary | ICD-10-CM | POA: Diagnosis not present

## 2017-11-14 DIAGNOSIS — S42202D Unspecified fracture of upper end of left humerus, subsequent encounter for fracture with routine healing: Secondary | ICD-10-CM | POA: Diagnosis not present

## 2017-11-14 DIAGNOSIS — N183 Chronic kidney disease, stage 3 (moderate): Secondary | ICD-10-CM | POA: Diagnosis not present

## 2017-11-14 DIAGNOSIS — I129 Hypertensive chronic kidney disease with stage 1 through stage 4 chronic kidney disease, or unspecified chronic kidney disease: Secondary | ICD-10-CM | POA: Diagnosis not present

## 2017-11-15 DIAGNOSIS — S02401D Maxillary fracture, unspecified, subsequent encounter for fracture with routine healing: Secondary | ICD-10-CM | POA: Diagnosis not present

## 2017-11-15 DIAGNOSIS — S43012D Anterior subluxation of left humerus, subsequent encounter: Secondary | ICD-10-CM | POA: Diagnosis not present

## 2017-11-15 DIAGNOSIS — M170 Bilateral primary osteoarthritis of knee: Secondary | ICD-10-CM | POA: Diagnosis not present

## 2017-11-15 DIAGNOSIS — S42202D Unspecified fracture of upper end of left humerus, subsequent encounter for fracture with routine healing: Secondary | ICD-10-CM | POA: Diagnosis not present

## 2017-11-15 DIAGNOSIS — N183 Chronic kidney disease, stage 3 (moderate): Secondary | ICD-10-CM | POA: Diagnosis not present

## 2017-11-15 DIAGNOSIS — I129 Hypertensive chronic kidney disease with stage 1 through stage 4 chronic kidney disease, or unspecified chronic kidney disease: Secondary | ICD-10-CM | POA: Diagnosis not present

## 2017-11-17 DIAGNOSIS — S43012D Anterior subluxation of left humerus, subsequent encounter: Secondary | ICD-10-CM | POA: Diagnosis not present

## 2017-11-17 DIAGNOSIS — M170 Bilateral primary osteoarthritis of knee: Secondary | ICD-10-CM | POA: Diagnosis not present

## 2017-11-17 DIAGNOSIS — S42202D Unspecified fracture of upper end of left humerus, subsequent encounter for fracture with routine healing: Secondary | ICD-10-CM | POA: Diagnosis not present

## 2017-11-17 DIAGNOSIS — I129 Hypertensive chronic kidney disease with stage 1 through stage 4 chronic kidney disease, or unspecified chronic kidney disease: Secondary | ICD-10-CM | POA: Diagnosis not present

## 2017-11-17 DIAGNOSIS — S02401D Maxillary fracture, unspecified, subsequent encounter for fracture with routine healing: Secondary | ICD-10-CM | POA: Diagnosis not present

## 2017-11-17 DIAGNOSIS — N183 Chronic kidney disease, stage 3 (moderate): Secondary | ICD-10-CM | POA: Diagnosis not present

## 2017-11-21 DIAGNOSIS — S42202D Unspecified fracture of upper end of left humerus, subsequent encounter for fracture with routine healing: Secondary | ICD-10-CM | POA: Diagnosis not present

## 2017-11-21 DIAGNOSIS — M170 Bilateral primary osteoarthritis of knee: Secondary | ICD-10-CM | POA: Diagnosis not present

## 2017-11-21 DIAGNOSIS — S43012D Anterior subluxation of left humerus, subsequent encounter: Secondary | ICD-10-CM | POA: Diagnosis not present

## 2017-11-21 DIAGNOSIS — N183 Chronic kidney disease, stage 3 (moderate): Secondary | ICD-10-CM | POA: Diagnosis not present

## 2017-11-21 DIAGNOSIS — S02401D Maxillary fracture, unspecified, subsequent encounter for fracture with routine healing: Secondary | ICD-10-CM | POA: Diagnosis not present

## 2017-11-21 DIAGNOSIS — I129 Hypertensive chronic kidney disease with stage 1 through stage 4 chronic kidney disease, or unspecified chronic kidney disease: Secondary | ICD-10-CM | POA: Diagnosis not present

## 2017-11-22 DIAGNOSIS — N183 Chronic kidney disease, stage 3 (moderate): Secondary | ICD-10-CM | POA: Diagnosis not present

## 2017-11-22 DIAGNOSIS — N390 Urinary tract infection, site not specified: Secondary | ICD-10-CM | POA: Diagnosis not present

## 2017-11-22 DIAGNOSIS — N3281 Overactive bladder: Secondary | ICD-10-CM | POA: Diagnosis not present

## 2017-11-24 DIAGNOSIS — I129 Hypertensive chronic kidney disease with stage 1 through stage 4 chronic kidney disease, or unspecified chronic kidney disease: Secondary | ICD-10-CM | POA: Diagnosis not present

## 2017-11-24 DIAGNOSIS — N183 Chronic kidney disease, stage 3 (moderate): Secondary | ICD-10-CM | POA: Diagnosis not present

## 2017-11-24 DIAGNOSIS — M170 Bilateral primary osteoarthritis of knee: Secondary | ICD-10-CM | POA: Diagnosis not present

## 2017-11-24 DIAGNOSIS — S02401D Maxillary fracture, unspecified, subsequent encounter for fracture with routine healing: Secondary | ICD-10-CM | POA: Diagnosis not present

## 2017-11-24 DIAGNOSIS — S42202D Unspecified fracture of upper end of left humerus, subsequent encounter for fracture with routine healing: Secondary | ICD-10-CM | POA: Diagnosis not present

## 2017-11-24 DIAGNOSIS — S43012D Anterior subluxation of left humerus, subsequent encounter: Secondary | ICD-10-CM | POA: Diagnosis not present

## 2017-11-27 DIAGNOSIS — I129 Hypertensive chronic kidney disease with stage 1 through stage 4 chronic kidney disease, or unspecified chronic kidney disease: Secondary | ICD-10-CM | POA: Diagnosis not present

## 2017-11-27 DIAGNOSIS — M170 Bilateral primary osteoarthritis of knee: Secondary | ICD-10-CM | POA: Diagnosis not present

## 2017-11-27 DIAGNOSIS — S43012D Anterior subluxation of left humerus, subsequent encounter: Secondary | ICD-10-CM | POA: Diagnosis not present

## 2017-11-27 DIAGNOSIS — N183 Chronic kidney disease, stage 3 (moderate): Secondary | ICD-10-CM | POA: Diagnosis not present

## 2017-11-27 DIAGNOSIS — S02401D Maxillary fracture, unspecified, subsequent encounter for fracture with routine healing: Secondary | ICD-10-CM | POA: Diagnosis not present

## 2017-11-27 DIAGNOSIS — S42202D Unspecified fracture of upper end of left humerus, subsequent encounter for fracture with routine healing: Secondary | ICD-10-CM | POA: Diagnosis not present

## 2017-12-01 DIAGNOSIS — S42202D Unspecified fracture of upper end of left humerus, subsequent encounter for fracture with routine healing: Secondary | ICD-10-CM | POA: Diagnosis not present

## 2017-12-01 DIAGNOSIS — N183 Chronic kidney disease, stage 3 (moderate): Secondary | ICD-10-CM | POA: Diagnosis not present

## 2017-12-01 DIAGNOSIS — S02401D Maxillary fracture, unspecified, subsequent encounter for fracture with routine healing: Secondary | ICD-10-CM | POA: Diagnosis not present

## 2017-12-01 DIAGNOSIS — I129 Hypertensive chronic kidney disease with stage 1 through stage 4 chronic kidney disease, or unspecified chronic kidney disease: Secondary | ICD-10-CM | POA: Diagnosis not present

## 2017-12-01 DIAGNOSIS — M170 Bilateral primary osteoarthritis of knee: Secondary | ICD-10-CM | POA: Diagnosis not present

## 2017-12-01 DIAGNOSIS — S43012D Anterior subluxation of left humerus, subsequent encounter: Secondary | ICD-10-CM | POA: Diagnosis not present

## 2017-12-05 DIAGNOSIS — N183 Chronic kidney disease, stage 3 (moderate): Secondary | ICD-10-CM | POA: Diagnosis not present

## 2017-12-05 DIAGNOSIS — S42202D Unspecified fracture of upper end of left humerus, subsequent encounter for fracture with routine healing: Secondary | ICD-10-CM | POA: Diagnosis not present

## 2017-12-05 DIAGNOSIS — S02401D Maxillary fracture, unspecified, subsequent encounter for fracture with routine healing: Secondary | ICD-10-CM | POA: Diagnosis not present

## 2017-12-05 DIAGNOSIS — M170 Bilateral primary osteoarthritis of knee: Secondary | ICD-10-CM | POA: Diagnosis not present

## 2017-12-05 DIAGNOSIS — S43012D Anterior subluxation of left humerus, subsequent encounter: Secondary | ICD-10-CM | POA: Diagnosis not present

## 2017-12-05 DIAGNOSIS — I129 Hypertensive chronic kidney disease with stage 1 through stage 4 chronic kidney disease, or unspecified chronic kidney disease: Secondary | ICD-10-CM | POA: Diagnosis not present

## 2017-12-07 ENCOUNTER — Telehealth: Payer: Self-pay

## 2017-12-07 DIAGNOSIS — N183 Chronic kidney disease, stage 3 (moderate): Secondary | ICD-10-CM | POA: Diagnosis not present

## 2017-12-07 DIAGNOSIS — S02401D Maxillary fracture, unspecified, subsequent encounter for fracture with routine healing: Secondary | ICD-10-CM | POA: Diagnosis not present

## 2017-12-07 DIAGNOSIS — S43012D Anterior subluxation of left humerus, subsequent encounter: Secondary | ICD-10-CM | POA: Diagnosis not present

## 2017-12-07 DIAGNOSIS — S42202D Unspecified fracture of upper end of left humerus, subsequent encounter for fracture with routine healing: Secondary | ICD-10-CM | POA: Diagnosis not present

## 2017-12-07 DIAGNOSIS — I129 Hypertensive chronic kidney disease with stage 1 through stage 4 chronic kidney disease, or unspecified chronic kidney disease: Secondary | ICD-10-CM | POA: Diagnosis not present

## 2017-12-07 DIAGNOSIS — M170 Bilateral primary osteoarthritis of knee: Secondary | ICD-10-CM | POA: Diagnosis not present

## 2017-12-07 NOTE — Telephone Encounter (Signed)
Called Manuela Schwartz back, verbal orders for OT given.

## 2017-12-07 NOTE — Telephone Encounter (Signed)
Manuela Schwartz, OT with [facility], called for verbal orders:  Cont HH OT 1x/week x 1, 2x/week x 1,  1x/week x 3. For LUE rehab and improving safety skills with ADL's and functional mobility.  Call back - secure voicemail - [phone]  Danley Danker, RN [facility] RN)

## 2017-12-12 ENCOUNTER — Ambulatory Visit: Payer: Medicare Other | Admitting: Family Medicine

## 2017-12-12 DIAGNOSIS — S43012D Anterior subluxation of left humerus, subsequent encounter: Secondary | ICD-10-CM | POA: Diagnosis not present

## 2017-12-12 DIAGNOSIS — I129 Hypertensive chronic kidney disease with stage 1 through stage 4 chronic kidney disease, or unspecified chronic kidney disease: Secondary | ICD-10-CM | POA: Diagnosis not present

## 2017-12-12 DIAGNOSIS — N183 Chronic kidney disease, stage 3 (moderate): Secondary | ICD-10-CM | POA: Diagnosis not present

## 2017-12-12 DIAGNOSIS — M170 Bilateral primary osteoarthritis of knee: Secondary | ICD-10-CM | POA: Diagnosis not present

## 2017-12-12 DIAGNOSIS — S02401D Maxillary fracture, unspecified, subsequent encounter for fracture with routine healing: Secondary | ICD-10-CM | POA: Diagnosis not present

## 2017-12-12 DIAGNOSIS — S42202D Unspecified fracture of upper end of left humerus, subsequent encounter for fracture with routine healing: Secondary | ICD-10-CM | POA: Diagnosis not present

## 2017-12-15 DIAGNOSIS — I129 Hypertensive chronic kidney disease with stage 1 through stage 4 chronic kidney disease, or unspecified chronic kidney disease: Secondary | ICD-10-CM | POA: Diagnosis not present

## 2017-12-15 DIAGNOSIS — M170 Bilateral primary osteoarthritis of knee: Secondary | ICD-10-CM | POA: Diagnosis not present

## 2017-12-15 DIAGNOSIS — S02401D Maxillary fracture, unspecified, subsequent encounter for fracture with routine healing: Secondary | ICD-10-CM | POA: Diagnosis not present

## 2017-12-15 DIAGNOSIS — S42202D Unspecified fracture of upper end of left humerus, subsequent encounter for fracture with routine healing: Secondary | ICD-10-CM | POA: Diagnosis not present

## 2017-12-15 DIAGNOSIS — S43012D Anterior subluxation of left humerus, subsequent encounter: Secondary | ICD-10-CM | POA: Diagnosis not present

## 2017-12-15 DIAGNOSIS — N183 Chronic kidney disease, stage 3 (moderate): Secondary | ICD-10-CM | POA: Diagnosis not present

## 2017-12-18 DIAGNOSIS — S42232D 3-part fracture of surgical neck of left humerus, subsequent encounter for fracture with routine healing: Secondary | ICD-10-CM | POA: Diagnosis not present

## 2017-12-21 DIAGNOSIS — S42202D Unspecified fracture of upper end of left humerus, subsequent encounter for fracture with routine healing: Secondary | ICD-10-CM | POA: Diagnosis not present

## 2017-12-21 DIAGNOSIS — M170 Bilateral primary osteoarthritis of knee: Secondary | ICD-10-CM | POA: Diagnosis not present

## 2017-12-21 DIAGNOSIS — I129 Hypertensive chronic kidney disease with stage 1 through stage 4 chronic kidney disease, or unspecified chronic kidney disease: Secondary | ICD-10-CM | POA: Diagnosis not present

## 2017-12-21 DIAGNOSIS — S43012D Anterior subluxation of left humerus, subsequent encounter: Secondary | ICD-10-CM | POA: Diagnosis not present

## 2017-12-21 DIAGNOSIS — N183 Chronic kidney disease, stage 3 (moderate): Secondary | ICD-10-CM | POA: Diagnosis not present

## 2017-12-21 DIAGNOSIS — S02401D Maxillary fracture, unspecified, subsequent encounter for fracture with routine healing: Secondary | ICD-10-CM | POA: Diagnosis not present

## 2017-12-22 ENCOUNTER — Encounter: Payer: Self-pay | Admitting: Family Medicine

## 2017-12-22 ENCOUNTER — Other Ambulatory Visit: Payer: Self-pay

## 2017-12-22 ENCOUNTER — Ambulatory Visit (INDEPENDENT_AMBULATORY_CARE_PROVIDER_SITE_OTHER): Payer: Medicare Other | Admitting: Family Medicine

## 2017-12-22 DIAGNOSIS — R109 Unspecified abdominal pain: Secondary | ICD-10-CM

## 2017-12-22 DIAGNOSIS — Z8719 Personal history of other diseases of the digestive system: Secondary | ICD-10-CM

## 2017-12-22 MED ORDER — PANTOPRAZOLE SODIUM 40 MG PO TBEC: 40.0000 mg | DELAYED_RELEASE_TABLET | Freq: Every day | ORAL | 0 refills | Status: DC

## 2017-12-22 NOTE — Patient Instructions (Addendum)
It was nice seeing you again today.  You were seen in clinic for on and off abdominal pain over the last month and epigastric pain.  It is possible that your symptoms are related to reflux and/or irritable bowel syndrome.  Given that your symptoms sometimes worsen after eating ice cream or drinking milk, it is also possible you may have some degree of lactose intolerance.    As we discussed, I would try to limit intake of these over the next 7-10 days to see if this makes you feel better.  Additionally I am prescribing you pantoprazole which you can take daily for your acid reflux.  If you have no improvement in symptoms or they worsen, I would like you to make an appointment to be seen by provider again.  Please call clinic if you have any questions.  Be well, [name] MD

## 2017-12-22 NOTE — Progress Notes (Signed)
Subjective:   Patient ID: Becky Leach    DOB: Aug 31, 1929, 84 y.o. female   MRN: [id number]  CC: abdominal discomfort  HPI: Becky Leach is a 84 y.o. female who presents to clinic today for the following issues.  Abdominal discomfort Patient endorses on and off abdominal pain for the next month.  She states she has diarrhea at times, is constipated at times.  She eats well, mainly greens, potatoes and primarily vegetables with fish, chicken or [place] as her protein source.  She feels that she may possibly be having acid reflux from drinking orange juice daily.  She also has experienced some bloating and gas especially after eating ice cream, milk or other dairy products.  She does not eat a lot of spicy foods or chocolate, no alcohol use.  She is a never smoker.  No fever, chills, nausea, vomiting.  No suprapubic tenderness or urinary symptoms.  History of hemorrhoids Patient's son has noticed some streaks of blood in her stool.  She had a colonoscopy done in 2013 which showed a bleeding ulcer and some hemorrhoids.  She has not been using any medications for this.  She states she sometimes has to strain to use the bathroom.  She has not noticed bright red blood in the bowl.     ROS: No fever, chills, nausea, vomiting. +abdominal pain, constipation, diarrhea.   Social: pt is a never smoker.  Medications reviewed. Objective:   BP 140/80   Pulse 65   Temp 98.4 F (36.9 C) (Oral)   Ht 5\' 2"  (1.575 m)   Wt 103 lb 12.8 oz (47.1 kg)   SpO2 93%   BMI 18.99 kg/m  Vitals and nursing note reviewed.  General: 84 year old female, elderly, NAD HEENT: NCAT, EOMI, PERRL, MMM, oropharynx clear Neck: supple, non-tender, normal ROM, no LAD  CV: RRR no MRG  Lungs: CTAB, normal effort  Abdomen: soft, NTND, no masses or organomegaly, +bs  Skin: warm, dry, no rash Extremities: warm and well perfused, normal tone Neuro: alert, oriented x3, no focal deficits   Assessment & Plan:   Abdominal  discomfort Differential is broad and includes acid reflux vs IBD vs lactose intolerance.  Possible this is related to GERD, although she is a never smoker and does not eat a lot of spicy foods, chocolate or alcohol.  We will trial pantoprazole 40 mg daily.  Have discussed that certain foods can cause flatulence and recommend cutting out dairy for 7 to 10 days to see if this results in any improvement in her symptoms.  Not an acute abdomen and no systemic symptoms.  Exam is benign with no rebound, guarding or tenderness. -Rx: Pantoprazole 40 mg -Discussed eating 1 to 2 hours before laying down at night so as to reduce symptoms of reflux -return precautions discussed  History of hemorrhoids Patient's son has recently noticed streaks of blood in stool, colonoscopy 2013 showed bleeding ulcer and hemorrhoids.  It is likely that her bleeding is associated with this as she reports constipation and straining at times.  Exam without evidence of active bleeding. - May use OTC Preparation H - Recommend sitz baths - advised increasing fiber in diet and maintaining water intake - Strict return precautions discussed  Meds ordered this encounter  Medications  . pantoprazole (PROTONIX) 40 MG tablet    Sig: Take 1 tablet (40 mg total) by mouth daily.    Dispense:  90 tablet    Refill:  0    [name],  MD [place], PGY-3 12/25/2017 12:12 PM

## 2017-12-25 DIAGNOSIS — S42202D Unspecified fracture of upper end of left humerus, subsequent encounter for fracture with routine healing: Secondary | ICD-10-CM | POA: Diagnosis not present

## 2017-12-25 DIAGNOSIS — R109 Unspecified abdominal pain: Secondary | ICD-10-CM | POA: Insufficient documentation

## 2017-12-25 DIAGNOSIS — S02401D Maxillary fracture, unspecified, subsequent encounter for fracture with routine healing: Secondary | ICD-10-CM | POA: Diagnosis not present

## 2017-12-25 DIAGNOSIS — Z8719 Personal history of other diseases of the digestive system: Secondary | ICD-10-CM

## 2017-12-25 DIAGNOSIS — S43012D Anterior subluxation of left humerus, subsequent encounter: Secondary | ICD-10-CM | POA: Diagnosis not present

## 2017-12-25 DIAGNOSIS — I129 Hypertensive chronic kidney disease with stage 1 through stage 4 chronic kidney disease, or unspecified chronic kidney disease: Secondary | ICD-10-CM | POA: Diagnosis not present

## 2017-12-25 DIAGNOSIS — N183 Chronic kidney disease, stage 3 (moderate): Secondary | ICD-10-CM | POA: Diagnosis not present

## 2017-12-25 DIAGNOSIS — M170 Bilateral primary osteoarthritis of knee: Secondary | ICD-10-CM | POA: Diagnosis not present

## 2017-12-25 HISTORY — DX: Personal history of other diseases of the digestive system: Z87.19

## 2017-12-25 NOTE — Assessment & Plan Note (Addendum)
Differential is broad and includes acid reflux vs IBD vs lactose intolerance.  Possible this is related to GERD, although she is a never smoker and does not eat a lot of spicy foods, chocolate or alcohol.  We will trial pantoprazole 40 mg daily.  Have discussed that certain foods can cause flatulence and recommend cutting out dairy for 7 to 10 days to see if this results in any improvement in her symptoms.  Not an acute abdomen and no systemic symptoms.  Exam is benign with no rebound, guarding or tenderness. -Rx: Pantoprazole 40 mg -Discussed eating 1 to 2 hours before laying down at night so as to reduce symptoms of reflux -return precautions discussed

## 2017-12-25 NOTE — Assessment & Plan Note (Signed)
Patient's son has recently noticed streaks of blood in stool, colonoscopy 2013 showed bleeding ulcer and hemorrhoids.  It is likely that her bleeding is associated with this as she reports constipation and straining at times.  Exam without evidence of active bleeding. - May use OTC Preparation H - Recommend sitz baths - advised increasing fiber in diet and maintaining water intake - Strict return precautions discussed

## 2017-12-27 ENCOUNTER — Other Ambulatory Visit: Payer: Self-pay | Admitting: *Deleted

## 2017-12-27 DIAGNOSIS — S42292A Other displaced fracture of upper end of left humerus, initial encounter for closed fracture: Secondary | ICD-10-CM

## 2017-12-28 MED ORDER — TRAMADOL HCL 50 MG PO TABS: 25.0000 mg | ORAL_TABLET | Freq: Four times a day (QID) | ORAL | 0 refills | Status: DC | PRN

## 2018-01-01 DIAGNOSIS — S42202D Unspecified fracture of upper end of left humerus, subsequent encounter for fracture with routine healing: Secondary | ICD-10-CM | POA: Diagnosis not present

## 2018-01-01 DIAGNOSIS — I129 Hypertensive chronic kidney disease with stage 1 through stage 4 chronic kidney disease, or unspecified chronic kidney disease: Secondary | ICD-10-CM | POA: Diagnosis not present

## 2018-01-01 DIAGNOSIS — S02401D Maxillary fracture, unspecified, subsequent encounter for fracture with routine healing: Secondary | ICD-10-CM | POA: Diagnosis not present

## 2018-01-01 DIAGNOSIS — M170 Bilateral primary osteoarthritis of knee: Secondary | ICD-10-CM | POA: Diagnosis not present

## 2018-01-01 DIAGNOSIS — N183 Chronic kidney disease, stage 3 (moderate): Secondary | ICD-10-CM | POA: Diagnosis not present

## 2018-01-01 DIAGNOSIS — S43012D Anterior subluxation of left humerus, subsequent encounter: Secondary | ICD-10-CM | POA: Diagnosis not present

## 2018-01-03 ENCOUNTER — Encounter: Payer: Self-pay | Admitting: Pulmonary Disease

## 2018-01-03 ENCOUNTER — Ambulatory Visit (INDEPENDENT_AMBULATORY_CARE_PROVIDER_SITE_OTHER): Payer: Medicare Other | Admitting: Pulmonary Disease

## 2018-01-03 VITALS — BP 118/80 | HR 90 | Ht 64.0 in | Wt 105.8 lb

## 2018-01-03 DIAGNOSIS — I2699 Other pulmonary embolism without acute cor pulmonale: Secondary | ICD-10-CM

## 2018-01-03 MED ORDER — APIXABAN 5 MG PO TABS: ORAL_TABLET | ORAL | 3 refills | Status: DC

## 2018-01-03 MED ORDER — PANTOPRAZOLE SODIUM 40 MG PO TBEC: 40.0000 mg | DELAYED_RELEASE_TABLET | Freq: Every day | ORAL | 3 refills | Status: DC

## 2018-01-03 MED ORDER — LORATADINE 10 MG PO TABS: 10.0000 mg | ORAL_TABLET | Freq: Every day | ORAL | 3 refills | Status: DC

## 2018-01-03 NOTE — Addendum Note (Signed)
Addended by: Jannette Spanner on: 01/03/2018 03:05 PM   Modules accepted: Orders

## 2018-01-03 NOTE — Progress Notes (Signed)
Becky Leach    [id number]    1930/05/16  Primary Care Physician:Amin, Enis Slipper, MD  Referring Physician: [name], MD [address] [place], [place] [address]  Chief complaint: Follow-up for pulmonary embolism.  HPI: 84 year old with past history of hypertension.  Admitted in May 11, 2017 with tachycardia, dizziness.  She had a CTA which showed submassive pulmonary embolism.  There are no precipitating factors for pulmonary embolism.  She is never suffered from DVT or pulmonary embolism before.  Although she has osteoarthritis she is still able to ambulate and is not bedbound.  She has had no recent prolonged travel she has had no recent surgeries other than an injection of her right knee.  She does not smoke she is not obese and has no known history of cancer. Pertinent to her risk for anticoagulation she did have peptic ulcer disease in 2013 due to NSAID use which has not recurred.  She has never had any unusual bleeding, she has not had recent surgery on her eyes brain or back.  She was anticoagulated with heparin and discharged on Eliquis.  She is here for follow-up after hospitalization.  Denies any dyspnea, fevers, chills, cough, sputum production.  Pets: None Occupation: Retired Secretary/administrator Exposures: No known exposures Smoking history: Never smoker Travel History: Not significant  Interim history: She had a fall recently with left humerus fracture requiring ORIF 09/12/2017 Continues on Eliquis.  States that breathing is unchanged with no new complaints Seen by primary care for streaks of blood in stool secondary to hemorrhoids.  She has not had any recurrent episodes since then.  Outpatient Encounter Medications as of 01/03/2018  Medication Sig  . acetaminophen (TYLENOL) 325 MG tablet Take 2 tablets (650 mg total) by mouth every 6 (six) hours as needed for pain.  Marland Kitchen apixaban (ELIQUIS) 5 MG TABS tablet Take 1 tablet (5mg  total) twice daily.  . [facility] D3 2000 units  CAPS TAKE 1 TABLET (2,000 UNITS TOTAL) BY MOUTH 2 (TWO) TIMES DAILY.  Marland Kitchen loratadine (CLARITIN) 10 MG tablet Take 1 tablet (10 mg total) by mouth daily.  . pantoprazole (PROTONIX) 40 MG tablet Take 1 tablet (40 mg total) by mouth daily.  . traMADol (ULTRAM) 50 MG tablet Take 0.5 tablets (25 mg total) by mouth every 6 (six) hours as needed for severe pain.   No facility-administered encounter medications on file as of 01/03/2018.     Allergies as of 01/03/2018 - Review Complete 01/03/2018  Allergen Reaction Noted  . Penicillins Rash 12/06/2011    Past Medical History:  Diagnosis Date  . AKI (acute kidney injury) ([place])    due to NSAID  . Arthritis   . Closed fracture of left proximal humerus 09/14/2017  . Constipation   . DVT (deep venous thrombosis) ([place])   . Herniated disc   . Hypertension   . Pulmonary embolism ([place])   . Seasonal allergies   . Spinal stenosis   . Stomach ulcer    due to NSAID  . Tachycardia   . UTI (lower urinary tract infection)   . Vitamin D deficiency 09/14/2017    Past Surgical History:  Procedure Laterality Date  . COLONOSCOPY  12/21/2011   Procedure: COLONOSCOPY;  Surgeon: Wonda Horner, MD;  Location: [facility];  Service: Endoscopy;  Laterality: N/A;  . DILATION AND CURETTAGE OF UTERUS    . ORIF HUMERUS FRACTURE Left 09/12/2017   Procedure: OPEN REDUCTION INTERNAL FIXATION (ORIF) PROXIMAL  HUMERUS  FRACTURE;  Surgeon: Altamese [place], MD;  Location: [facility];  Service: Orthopedics;  Laterality: Left;  . TONSILLECTOMY AND ADENOIDECTOMY      Family History  Problem Relation Age of Onset  . Arthritis Mother   . Heart attack Mother     Social History   Socioeconomic History  . Marital status: Single    Spouse name: Not on file  . Number of children: 4  . Years of education: Not on file  . Highest education level: Not on file  Occupational History  . Not on file  Social Needs  . Financial resource strain: Not on file  . Food insecurity:     Worry: Not on file    Inability: Not on file  . Transportation needs:    Medical: Not on file    Non-medical: Not on file  Tobacco Use  . Smoking status: Never Smoker  . Smokeless tobacco: Never Used  Substance and Sexual Activity  . Alcohol use: No  . Drug use: No  . Sexual activity: Never  Lifestyle  . Physical activity:    Days per week: Not on file    Minutes per session: Not on file  . Stress: Not on file  Relationships  . Social connections:    Talks on phone: Not on file    Gets together: Not on file    Attends religious service: Not on file    Active member of club or organization: Not on file    Attends meetings of clubs or organizations: Not on file    Relationship status: Not on file  . Intimate partner violence:    Fear of current or ex partner: Not on file    Emotionally abused: Not on file    Physically abused: Not on file    Forced sexual activity: Not on file  Other Topics Concern  . Not on file  Social History Narrative   Born: [place], [place]   Lives with son, [name].            Review of systems: Review of Systems  Constitutional: Negative for fever and chills.  HENT: Negative.   Eyes: Negative for blurred vision.  Respiratory: as per HPI  Cardiovascular: Negative for chest pain and palpitations.  Gastrointestinal: Negative for vomiting, diarrhea, blood per rectum. Genitourinary: Negative for dysuria, urgency, frequency and hematuria.  Musculoskeletal: Negative for myalgias, back pain and joint pain.  Skin: Negative for itching and rash.  Neurological: Negative for dizziness, tremors, focal weakness, seizures and loss of consciousness.  Endo/Heme/Allergies: Negative for environmental allergies.  Psychiatric/Behavioral: Negative for depression, suicidal ideas and hallucinations.  All other systems reviewed and are negative.  Physical Exam: Blood pressure 118/80, pulse 90, height 5\' 4"  (1.626 m), weight 105 lb 12.8 oz (48 kg), SpO2 98 %. Gen:        No acute distress HEENT:  EOMI, sclera anicteric Neck:     No masses; no thyromegaly Lungs:    Clear to auscultation bilaterally; normal respiratory effort CV:         Regular rate and rhythm; no murmurs Abd:      + bowel sounds; soft, non-tender; no palpable masses, no distension Ext:    No edema; adequate peripheral perfusion Skin:      Warm and dry; no rash Neuro: alert and oriented x 3 Psych: normal mood and affect  Data Reviewed: Echocardiogram 05/11/17-LVEF 60-65%, high ventricular filling pressure, Right ventricle is mildly dilated.  Peak PA pressure 52.  Moderate  pulmonary hypertension.  CT angiogram 05/11/17- extensive bilateral pulmonary embolism.  RV/LV ratio 1.6.  I have reviewed the images personally.  Lower extremity duplex 05/12/17-DVT in the right femoral and common femoral vein.  Assessment/Plan: Follow-up for submassive pulmonary embolism, right DVT She continues on Eliquis without any issue Would recommend indefinite anticoagulation for unprovoked venous thromboembolism.  Noted to have peptic ulcer in 2013 secondary to NSAID use with no recurrence.   If she continues to have recurrent falls or more bleeding per rectum then will need to reconsider anticoagulation Follow-up in 1 year  Marshell Garfinkel MD [facility] 01/03/2018, 2:45 PM  CC: [name], MD

## 2018-01-03 NOTE — Patient Instructions (Signed)
Continue Eliquis If you have recurrent falls or more blood in your stool then we may need to reconsider using the blood thinner Follow-up in 1 year.

## 2018-01-15 DIAGNOSIS — N3281 Overactive bladder: Secondary | ICD-10-CM | POA: Diagnosis not present

## 2018-01-15 DIAGNOSIS — N183 Chronic kidney disease, stage 3 (moderate): Secondary | ICD-10-CM | POA: Diagnosis not present

## 2018-01-30 ENCOUNTER — Other Ambulatory Visit: Payer: Self-pay

## 2018-01-30 DIAGNOSIS — S42292A Other displaced fracture of upper end of left humerus, initial encounter for closed fracture: Secondary | ICD-10-CM

## 2018-01-31 MED ORDER — TRAMADOL HCL 50 MG PO TABS: 25.0000 mg | ORAL_TABLET | Freq: Four times a day (QID) | ORAL | 0 refills | Status: DC | PRN

## 2018-03-13 DIAGNOSIS — N311 Reflex neuropathic bladder, not elsewhere classified: Secondary | ICD-10-CM | POA: Diagnosis not present

## 2018-03-19 ENCOUNTER — Encounter: Payer: Self-pay | Admitting: Family Medicine

## 2018-03-19 ENCOUNTER — Ambulatory Visit (INDEPENDENT_AMBULATORY_CARE_PROVIDER_SITE_OTHER): Payer: Medicare Other | Admitting: Family Medicine

## 2018-03-19 ENCOUNTER — Other Ambulatory Visit: Payer: Self-pay

## 2018-03-19 VITALS — BP 124/72 | HR 106 | Temp 97.9°F | Ht 64.0 in | Wt 111.2 lb

## 2018-03-19 DIAGNOSIS — D279 Benign neoplasm of unspecified ovary: Secondary | ICD-10-CM

## 2018-03-19 DIAGNOSIS — Z23 Encounter for immunization: Secondary | ICD-10-CM | POA: Diagnosis not present

## 2018-03-19 DIAGNOSIS — M79605 Pain in left leg: Secondary | ICD-10-CM

## 2018-03-19 MED ORDER — GABAPENTIN 100 MG PO CAPS: 100.0000 mg | ORAL_CAPSULE | Freq: Three times a day (TID) | ORAL | 3 refills | Status: DC

## 2018-03-19 NOTE — Patient Instructions (Signed)
You were seen in clinic for following up on your stomach issues and leg pain.  We discussed that this pain may be coming from your osteoarthritis of your left hip.  I am prescribing you a medication called gabapentin and would like you to take 100 mg tablet 3 times a day.   Additionally, for your concern of ovarian tumor, I did not see any imaging in the chart to reflect this however have sent in the referral as you requested to gynecology.  You can expect a call within a week or so regarding scheduling this appointment.

## 2018-03-19 NOTE — Progress Notes (Addendum)
Subjective:   Patient ID: Becky Leach    DOB: 01-21-1930, 84 y.o. female   MRN: [id number]  CC: f/u stomach issues, pain in legs   HPI: Becky Leach is a 84 y.o. female who presents to clinic today for multiple issues.  Reflux  Patient's son reports she continues to take pantoprazole and reflux is overall improving.  She avoids hot spicy foods and eating late at night.  She remains upright following meals for 30 minutes to 1 hour.  No current concerns with this.   Concern for ?ovarian tumor Son reports while she was in the hospital she was treated for UTI and had an incidental finding of an ovarian tumor around 2013.  He is insistent that she received a referral to gynecology for work-up of this.  She has no symptoms of weight loss, loss of appetite and otherwise feels well.  No pelvic or abdominal pain.  Patient states she was seen by Dr. Lahoma Crocker for this.  Pain in left leg  Patient reports intermittent pain in her left leg, on off over the last 3 weeks.  No recent falls or injury, she does have known arthritis in her left hip.  Pain does not radiate down to the feet.  She has been trying tramadol and Voltaren gel.  Not associated with numbness and tingling.  Health maintenance -flu shot today   ROS: No fever, chills, nausea, vomiting.  No chest pain, shortness of breath.  Social: pt is a never smoker. Medications reviewed. Objective:   BP 124/72   Pulse (!) 106   Temp 97.9 F (36.6 C) (Oral)   Ht 5\' 4"  (1.626 m)   Wt 111 lb 3.2 oz (50.4 kg)   SpO2 96%   BMI 19.09 kg/m  Vitals and nursing note reviewed.  General: Well-appearing 84 year old female, NAD HEENT: NCAT, EOMI, PERRL, MMM, oropharynx clear Neck: supple, non-tender, normal ROM, no LAD  CV: RRR no MRG  Lungs: CTAB, normal effort  Abdomen: soft, NTND, no masses or organomegaly, +bs  Skin: warm, dry, no rash  L hip: Difficult to assess ROM given patient has limited mobility.   Pelvic alignment  unremarkable to inspection and palpation. Greater trochanter without tenderness to palpation. No SI joint tenderness and normal minimal SI movement.  Extremities: warm and well perfused, normal tone Neuro: alert, oriented x3, no focal deficits   Assessment & Plan:   Reflux Improving, continue pantoprazole  ?concern for ovarian tumor Abdominal exam is benign and no red flags on exam.  Upon chart review, I do not see imaging with reference to possible ovarian tumor.  Likely this was obtained outside of this EMR.  Will place referral to gyn per patient's request.   Pain of left lower extremity No recent falls or trauma.  Patient taking tramadol and Voltaren gel.  She notes intermittent worsening over the last 3 weeks.  Possible this may be coming from her osteoarthritis of left hip.  No radicular symptoms.  We will trial gabapentin. Rx: Gabapentin 100 mg 3 times daily  Health maintenance -flu shot today   Orders Placed This Encounter  Procedures  . Flu Vaccine QUAD 36+ mos IM  . Ambulatory referral to Gynecology    Referral Priority:   Routine    Referral Type:   Consultation    Referral Reason:   Specialty Services Required    Requested Specialty:   Gynecology    Number of Visits Requested:   1   Meds ordered  this encounter  Medications  . gabapentin (NEURONTIN) 100 MG capsule    Sig: Take 1 capsule (100 mg total) by mouth 3 (three) times daily.    Dispense:  90 capsule    Refill:  3    [name], MD [place] PGY-3

## 2018-03-23 ENCOUNTER — Telehealth: Payer: Self-pay | Admitting: Pulmonary Disease

## 2018-03-23 NOTE — Telephone Encounter (Signed)
Noted  

## 2018-03-23 NOTE — Telephone Encounter (Signed)
Called and spoke to Thurmon Fair, per Son he called [facility] because her usual protonix pill looked different. They told him that Dr. Vaughan Browner had refilled it for her and the manufacturer was different. Son is upset because he would like the patient's PCP to manage her Protonix. Apologized for the inconvenience and made Son aware I would let MR and his nurse aware he does not want his mother's protonix refilled by our office, only his PCP. Son expressed thanks. Voiced understanding. Nothing further is needed at this time.

## 2018-03-24 NOTE — Assessment & Plan Note (Signed)
No recent falls or trauma.  Patient taking tramadol and Voltaren gel.  She notes intermittent worsening over the last 3 weeks.  Possible this may be coming from her osteoarthritis of left hip.  No radicular symptoms.  We will trial gabapentin. Rx: Gabapentin 100 mg 3 times daily

## 2018-03-26 DIAGNOSIS — N311 Reflex neuropathic bladder, not elsewhere classified: Secondary | ICD-10-CM | POA: Diagnosis not present

## 2018-04-10 ENCOUNTER — Other Ambulatory Visit: Payer: Self-pay | Admitting: Family Medicine

## 2018-04-10 DIAGNOSIS — N311 Reflex neuropathic bladder, not elsewhere classified: Secondary | ICD-10-CM | POA: Diagnosis not present

## 2018-04-19 ENCOUNTER — Encounter: Payer: Self-pay | Admitting: Family Medicine

## 2018-04-30 ENCOUNTER — Encounter: Payer: Self-pay | Admitting: Obstetrics & Gynecology

## 2018-04-30 ENCOUNTER — Ambulatory Visit (INDEPENDENT_AMBULATORY_CARE_PROVIDER_SITE_OTHER): Payer: Medicare Other | Admitting: Obstetrics & Gynecology

## 2018-04-30 VITALS — BP 153/86 | HR 89 | Ht 62.0 in | Wt 111.9 lb

## 2018-04-30 DIAGNOSIS — N281 Cyst of kidney, acquired: Secondary | ICD-10-CM | POA: Diagnosis not present

## 2018-04-30 NOTE — Progress Notes (Signed)
   Subjective:    Patient ID: Becky Leach, female    DOB: March 28, 1930, 84 y.o.   MRN: [id number]  HPI 84 yo lady here because one of her son's wanted her to have a gyn visit (per her fam med note). She had a CT in 2018 that showed the reproductive organs to be "unremarkable". She denies and pain, bleeding, vaginal irritation, or any other gyn issue.   Review of Systems     Objective:   Physical Exam Breathing, conversing normally Brought in in a wheelchair     Assessment & Plan:  Asymptomatic lady with normal CT Reassurance given Come back prn

## 2018-05-18 ENCOUNTER — Encounter: Payer: Self-pay | Admitting: Podiatry

## 2018-05-18 ENCOUNTER — Ambulatory Visit (INDEPENDENT_AMBULATORY_CARE_PROVIDER_SITE_OTHER): Payer: Medicare Other | Admitting: Podiatry

## 2018-05-18 VITALS — BP 144/100

## 2018-05-18 DIAGNOSIS — B351 Tinea unguium: Secondary | ICD-10-CM

## 2018-05-18 DIAGNOSIS — M79674 Pain in right toe(s): Secondary | ICD-10-CM | POA: Diagnosis not present

## 2018-05-18 DIAGNOSIS — M79675 Pain in left toe(s): Secondary | ICD-10-CM

## 2018-06-18 DIAGNOSIS — R300 Dysuria: Secondary | ICD-10-CM | POA: Diagnosis not present

## 2018-06-18 DIAGNOSIS — N311 Reflex neuropathic bladder, not elsewhere classified: Secondary | ICD-10-CM | POA: Diagnosis not present

## 2018-06-23 NOTE — Progress Notes (Signed)
Subjective:   Patient ID: Becky Leach, female    DOB: 1930/03/25, 85 y.o.   MRN: [id number]  Subjective: Becky Leach presents today referred by [name], MD for painful, discolored, thick toenails which interfere with daily activities.  Duration is approximately 1 month pain is aggravated when wearing enclosed shoe gear. Pain is relieved with periodic professional debridement.  Past Medical History:  Diagnosis Date  . AKI (acute kidney injury) ([place])    due to NSAID  . Arthritis   . Closed fracture of left proximal humerus 09/14/2017  . Constipation   . DVT (deep venous thrombosis) ([place])   . Herniated disc   . Hypertension   . Pulmonary embolism ([place])   . Seasonal allergies   . Spinal stenosis   . Stomach ulcer    due to NSAID  . Tachycardia   . UTI (lower urinary tract infection)   . Vitamin D deficiency 09/14/2017     Patient Active Problem List   Diagnosis Date Noted  . Abdominal discomfort 12/25/2017  . History of hemorrhoids 12/25/2017  . Vitamin D deficiency 09/14/2017  . Closed fracture of left proximal humerus 09/14/2017  . Humeral fracture 09/12/2017  . Thrombocytosis ([place]) 09/12/2017  . Dislocation of left shoulder joint 09/12/2017  . Maxillary sinus fracture ([place]) 09/12/2017  . Acute head trauma 09/12/2017  . Fall 09/12/2017  . At risk for hemorrhage associated with anticoagulation therapy 09/12/2017  . Fever 09/12/2017  . Frequent falls 09/12/2017  . Pulmonary embolism ([place]) 05/11/2017  . Rhinorrhea 03/18/2017  . Impaired ambulation 01/12/2017  . Unilateral primary osteoarthritis, left knee 12/26/2016  . Unilateral primary osteoarthritis, right knee 12/26/2016  . Urge urinary incontinence 11/19/2014  . Pain of left lower extremity 08/28/2014  . Allergic rhinitis 02/04/2014  . Spinal stenosis of lumbar region 11/14/2013  . Benign essential HTN 12/06/2011     Past Surgical History:  Procedure Laterality Date  . COLONOSCOPY  12/21/2011   Procedure: COLONOSCOPY;  Surgeon: Wonda Horner, MD;  Location: [facility];  Service: Endoscopy;  Laterality: N/A;  . DILATION AND CURETTAGE OF UTERUS    . ORIF HUMERUS FRACTURE Left 09/12/2017   Procedure: OPEN REDUCTION INTERNAL FIXATION (ORIF) PROXIMAL  HUMERUS FRACTURE;  Surgeon: Altamese [place], MD;  Location: [facility];  Service: Orthopedics;  Laterality: Left;  . TONSILLECTOMY AND ADENOIDECTOMY        Current Outpatient Medications:  .  acetaminophen (TYLENOL) 325 MG tablet, Take 2 tablets (650 mg total) by mouth every 6 (six) hours as needed for pain., Disp: 30 tablet, Rfl: 0 .  apixaban (ELIQUIS) 5 MG TABS tablet, Take 1 tablet (5mg  total) twice daily., Disp: 180 tablet, Rfl: 3 .  [employer] D3 2000 units CAPS, TAKE 1 TABLET (2,000 UNITS TOTAL) BY MOUTH 2 (TWO) TIMES DAILY., Disp: 60 capsule, Rfl: 0 .  loratadine (CLARITIN) 10 MG tablet, Take 1 tablet (10 mg total) by mouth daily., Disp: 90 tablet, Rfl: 3 .  mirabegron ER (MYRBETRIQ) 50 MG TB24 tablet, Take 50 mg by mouth daily., Disp: , Rfl:  .  pantoprazole (PROTONIX) 40 MG tablet, Take 1 tablet (40 mg total) by mouth daily., Disp: 90 tablet, Rfl: 3 .  solifenacin (VESICARE) 5 MG tablet, Take 5 mg by mouth daily., Disp: , Rfl:  .  traMADol (ULTRAM) 50 MG tablet, Take 0.5 tablets (25 mg total) by mouth every 6 (six) hours as needed for severe pain., Disp: 20 tablet, Rfl: 0   Allergies  Allergen Reactions  .  Penicillins Rash    Cephalosporin tolerant given during surgery 08/2017     Social History   Occupational History  . Not on file  Tobacco Use  . Smoking status: Never Smoker  . Smokeless tobacco: Never Used  Substance and Sexual Activity  . Alcohol use: No  . Drug use: No  . Sexual activity: Never     Family History  Problem Relation Age of Onset  . Arthritis Mother   . Heart attack Mother      Immunization History  Administered Date(s) Administered  . Influenza,inj,Quad PF,6+ Mos 02/04/2014, 03/17/2015, 02/10/2016,  03/10/2017, 03/19/2018  . Pneumococcal Conjugate-13 02/04/2014  . Pneumococcal Polysaccharide-23 12/18/2011  . Tdap 09/11/2017     Review of Systems  HENT: Positive for rhinorrhea.   Genitourinary: Positive for urgency.  Musculoskeletal: Positive for arthralgias.  Allergic/Immunologic: Positive for environmental allergies.  Hematological: Bruises/bleeds easily.  All other systems reviewed and are negative.  Objective: Vascular Examination: Capillary refill time immediate x 10 digits Dorsalis pedis and posterior tibial pulses present b/l No digital hair x 10 digits Skin temperature gradient WNL b/l  Dermatological Examination: Pedal skin with age-related atrophy.  No open wounds.  No interdigital macerations.  Toenails 1-5 b/l discolored, thick, dystrophic with subungual debris and pain with palpation to nailbeds due to thickness of nails.  Musculoskeletal: Muscle strength 5/5 to all LE muscle groups  Neurological: Sensation intact with 10 gram monofilament Vibratory sensation intact.  Assessment: 1. Painful onychomycosis toenails 1-5 b/l  2. Patient on chronic anticoagulation   Plan: 1. Discussed onychomycosis and treatment options.  Literature dispensed on today. 2. Toenails 1-5 b/l were debrided in length and girth without iatrogenic bleeding. 3. Patient to continue soft, supportive shoe gear 4. Patient to report any pedal injuries to medical professional immediately. 5. Follow up 3 months. Patient/POA to call should there be a concern in the interim.

## 2018-06-23 NOTE — Progress Notes (Signed)
Subjective: Becky Leach presents today referred by [name], MD with cc of painful, discolored, thick toenails which interfere with daily activities.  Duration is greater than 1 month.  Pain is aggravated when wearing enclosed shoe gear. Pain is relieved with periodic professional debridement.  Past Medical History:  Diagnosis Date  . AKI (acute kidney injury) ([place])    due to NSAID  . Arthritis   . Closed fracture of left proximal humerus 09/14/2017  . Constipation   . DVT (deep venous thrombosis) ([place])   . Herniated disc   . Hypertension   . Pulmonary embolism ([place])   . Seasonal allergies   . Spinal stenosis   . Stomach ulcer    due to NSAID  . Tachycardia   . UTI (lower urinary tract infection)   . Vitamin D deficiency 09/14/2017     Patient Active Problem List   Diagnosis Date Noted  . Abdominal discomfort 12/25/2017  . History of hemorrhoids 12/25/2017  . Vitamin D deficiency 09/14/2017  . Closed fracture of left proximal humerus 09/14/2017  . Humeral fracture 09/12/2017  . Thrombocytosis ([place]) 09/12/2017  . Dislocation of left shoulder joint 09/12/2017  . Maxillary sinus fracture ([place]) 09/12/2017  . Acute head trauma 09/12/2017  . Fall 09/12/2017  . At risk for hemorrhage associated with anticoagulation therapy 09/12/2017  . Fever 09/12/2017  . Frequent falls 09/12/2017  . Pulmonary embolism ([place]) 05/11/2017  . Rhinorrhea 03/18/2017  . Impaired ambulation 01/12/2017  . Unilateral primary osteoarthritis, left knee 12/26/2016  . Unilateral primary osteoarthritis, right knee 12/26/2016  . Urge urinary incontinence 11/19/2014  . Pain of left lower extremity 08/28/2014  . Allergic rhinitis 02/04/2014  . Spinal stenosis of lumbar region 11/14/2013  . Benign essential HTN 12/06/2011     Past Surgical History:  Procedure Laterality Date  . COLONOSCOPY  12/21/2011   Procedure: COLONOSCOPY;  Surgeon: Wonda Horner, MD;  Location: [facility];  Service: Endoscopy;   Laterality: N/A;  . DILATION AND CURETTAGE OF UTERUS    . ORIF HUMERUS FRACTURE Left 09/12/2017   Procedure: OPEN REDUCTION INTERNAL FIXATION (ORIF) PROXIMAL  HUMERUS FRACTURE;  Surgeon: Altamese [place], MD;  Location: [facility];  Service: Orthopedics;  Laterality: Left;  . TONSILLECTOMY AND ADENOIDECTOMY        Current Outpatient Medications:  .  acetaminophen (TYLENOL) 325 MG tablet, Take 2 tablets (650 mg total) by mouth every 6 (six) hours as needed for pain., Disp: 30 tablet, Rfl: 0 .  apixaban (ELIQUIS) 5 MG TABS tablet, Take 1 tablet (5mg  total) twice daily., Disp: 180 tablet, Rfl: 3 .  CVS D3 2000 units CAPS, TAKE 1 TABLET (2,000 UNITS TOTAL) BY MOUTH 2 (TWO) TIMES DAILY., Disp: 60 capsule, Rfl: 0 .  loratadine (CLARITIN) 10 MG tablet, Take 1 tablet (10 mg total) by mouth daily., Disp: 90 tablet, Rfl: 3 .  mirabegron ER (MYRBETRIQ) 50 MG TB24 tablet, Take 50 mg by mouth daily., Disp: , Rfl:  .  pantoprazole (PROTONIX) 40 MG tablet, Take 1 tablet (40 mg total) by mouth daily., Disp: 90 tablet, Rfl: 3 .  solifenacin (VESICARE) 5 MG tablet, Take 5 mg by mouth daily., Disp: , Rfl:  .  traMADol (ULTRAM) 50 MG tablet, Take 0.5 tablets (25 mg total) by mouth every 6 (six) hours as needed for severe pain., Disp: 20 tablet, Rfl: 0   Allergies  Allergen Reactions  . Penicillins Rash    Cephalosporin tolerant given during surgery 08/2017  Social History   Occupational History  . Not on file  Tobacco Use  . Smoking status: Never Smoker  . Smokeless tobacco: Never Used  Substance and Sexual Activity  . Alcohol use: No  . Drug use: No  . Sexual activity: Never     Family History  Problem Relation Age of Onset  . Arthritis Mother   . Heart attack Mother      Immunization History  Administered Date(s) Administered  . Influenza,inj,Quad PF,6+ Mos 02/04/2014, 03/17/2015, 02/10/2016, 03/10/2017, 03/19/2018  . Pneumococcal Conjugate-13 02/04/2014  . Pneumococcal Polysaccharide-23  12/18/2011  . Tdap 09/11/2017     Review of systems: Objective: Vascular Examination: Capillary refill time immediate x 10 digits Dorsalis pedis and posterior tibial pulses present b/l No digital hair x 10 digits Skin temperature gradient WNL b/l  Dermatological Examination: Skin with normal turgor, texture and tone b/l  Toenails 1-5 b/l discolored, thick, dystrophic with subungual debris and pain with palpation to nailbeds due to thickness of nails.  Musculoskeletal: Muscle strength 5/5 to all LE muscle groups  Neurological: Sensation intact with 10 gram monofilament Vibratory sensation intact.  Assessment: 1. Painful onychomycosis toenails 1-5 b/l    Plan: 1. Discussed onychomycosis and treatment options.  Literature dispensed on today. 2. Toenails 1-5 b/l were debrided in length and girth without iatrogenic bleeding. 3. Patient to continue soft, supportive shoe gear 4. Patient to report any pedal injuries to medical professional immediately. 5. Follow up 3 months. Patient/POA to call should there be a concern in the interim.

## 2018-07-24 ENCOUNTER — Other Ambulatory Visit: Payer: Self-pay | Admitting: Family Medicine

## 2018-07-24 DIAGNOSIS — M159 Polyosteoarthritis, unspecified: Secondary | ICD-10-CM

## 2018-07-24 DIAGNOSIS — M150 Primary generalized (osteo)arthritis: Principal | ICD-10-CM

## 2018-08-01 IMAGING — RF DG HUMERUS 2V *L*
1 series · 11 of 11 positions shown · non-contrast
Comparison: 09/12/2017

CLINICAL DATA: Left shoulder fracture

EXAM:
LEFT HUMERUS - 2+ VIEW; DG C-ARM 61-120 MIN

[Series 1: run · 11 of 11 slices shown]
[im 1/11]
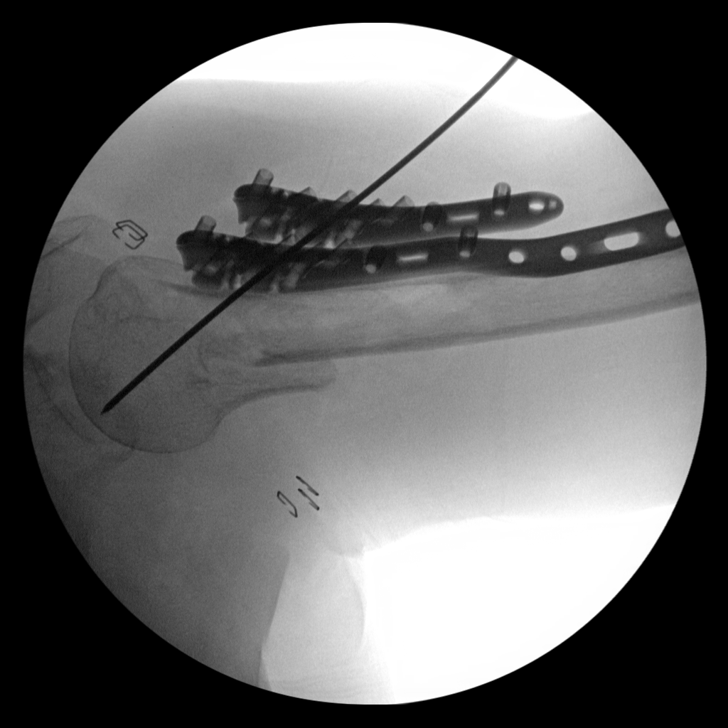
[im 2/11]
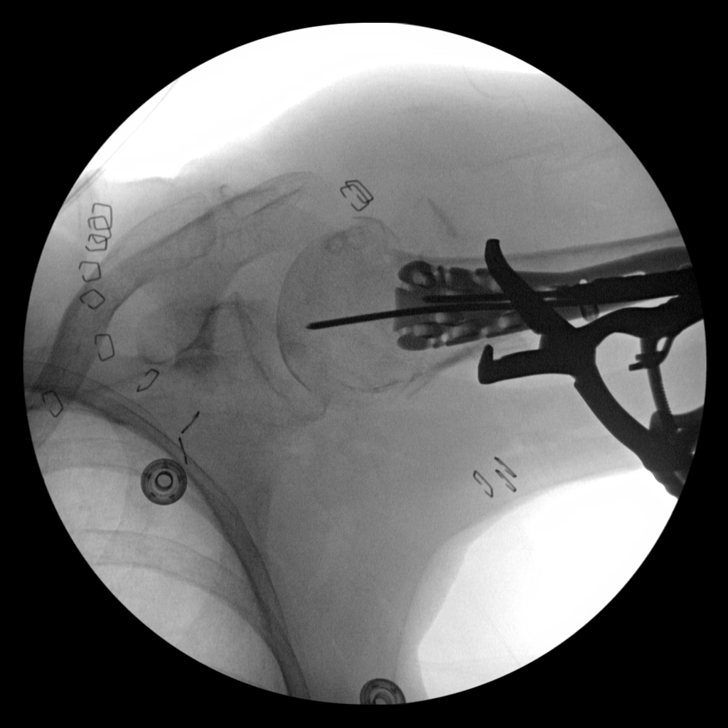
[im 3/11]
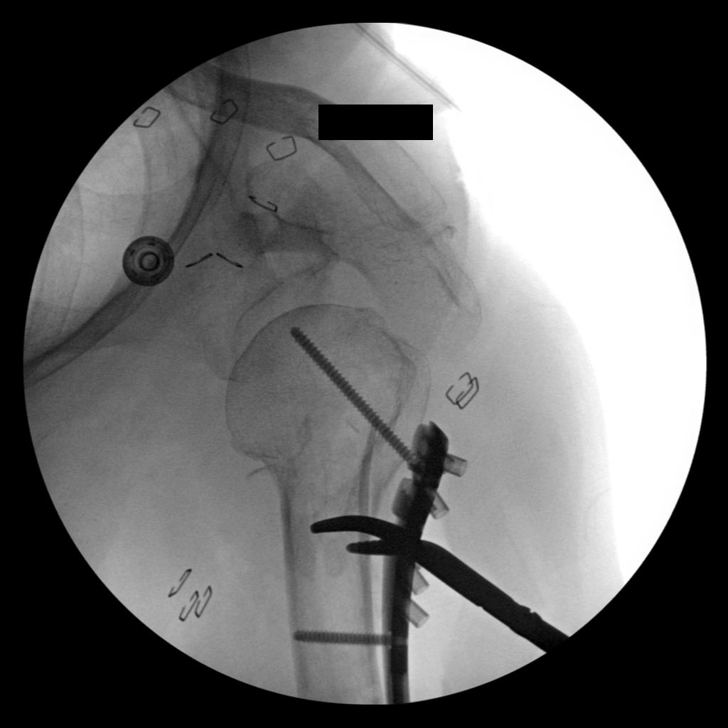
[im 4/11]
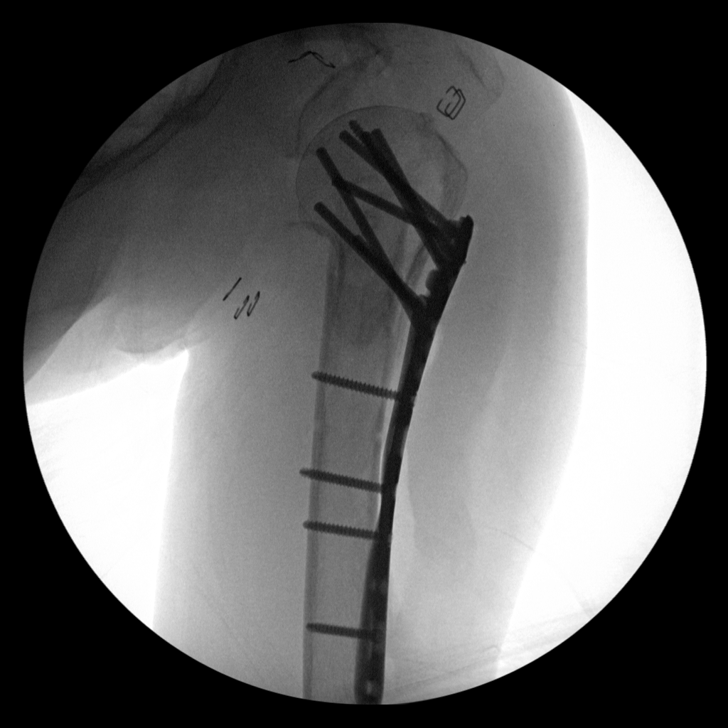
[im 5/11]
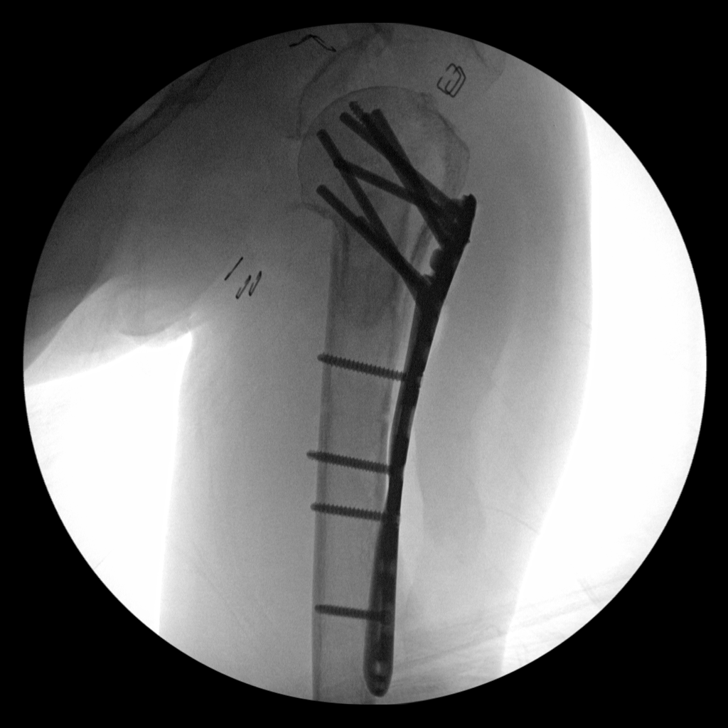
[im 6/11]
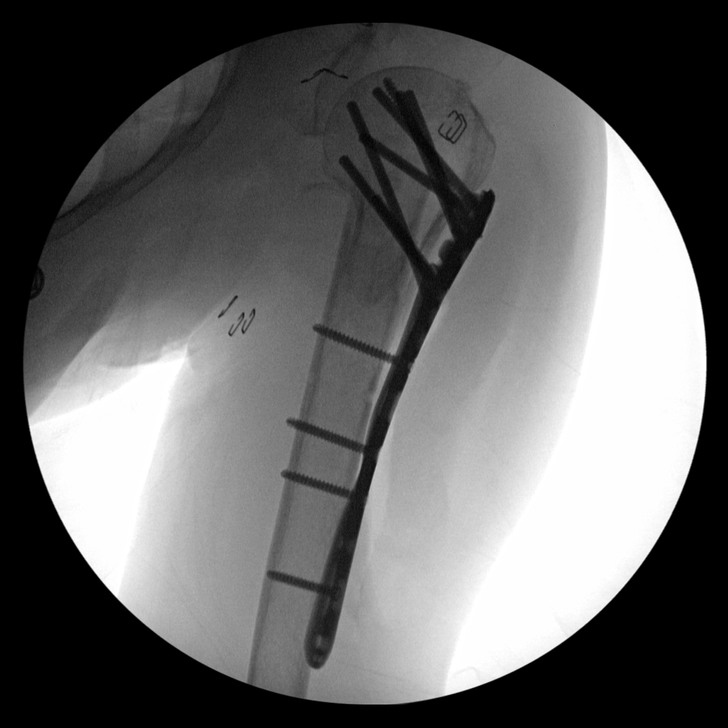
[im 7/11]
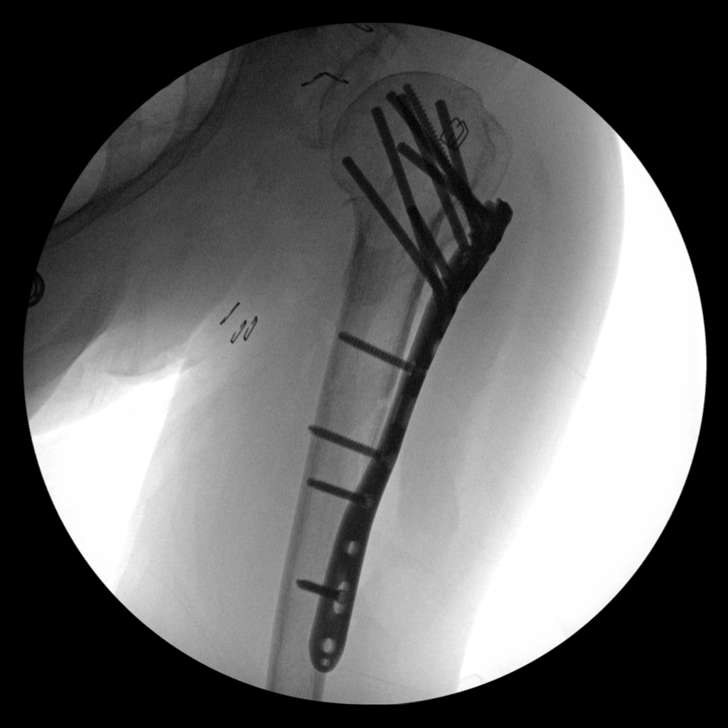
[im 8/11]
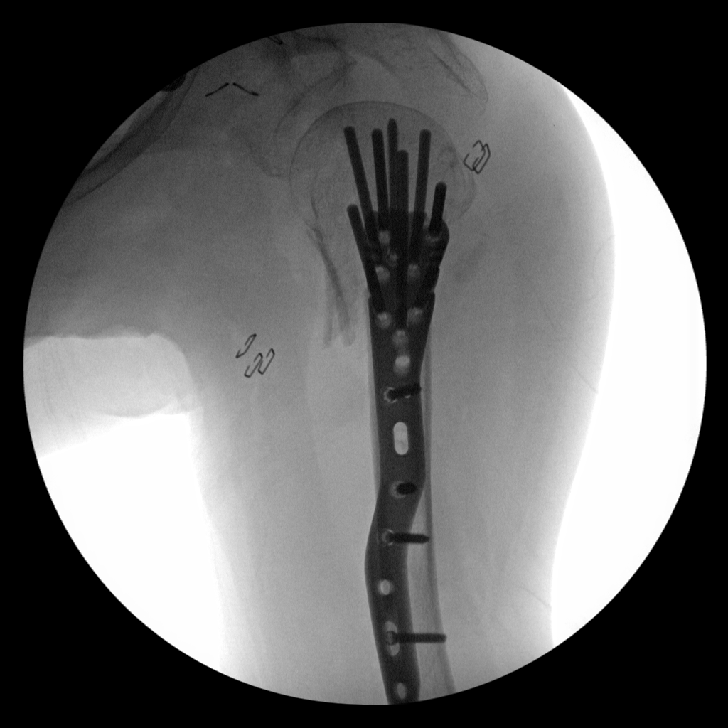
[im 9/11]
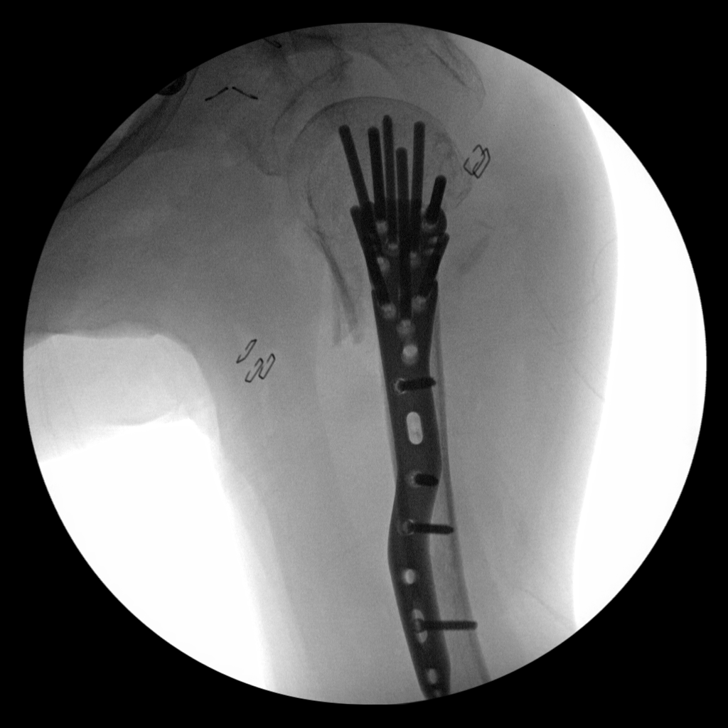
[im 10/11]
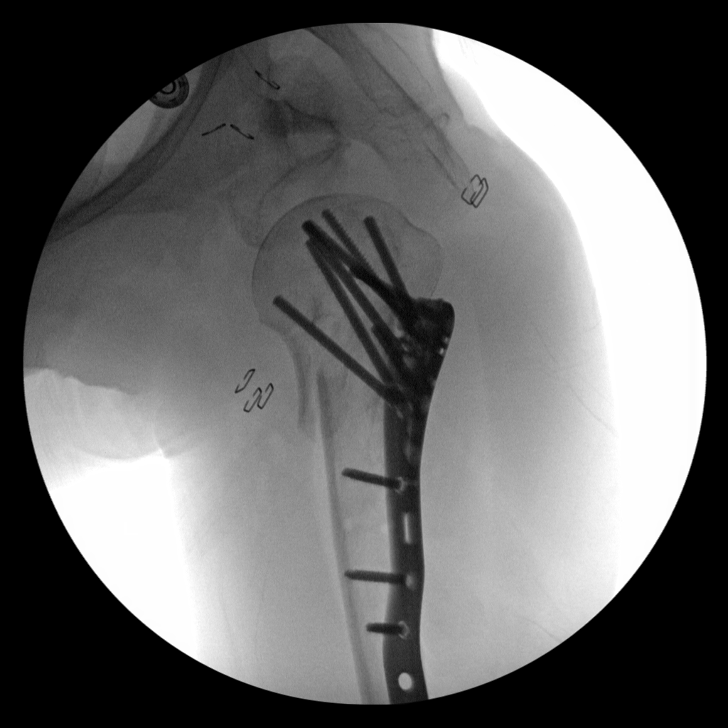
[im 11/11]
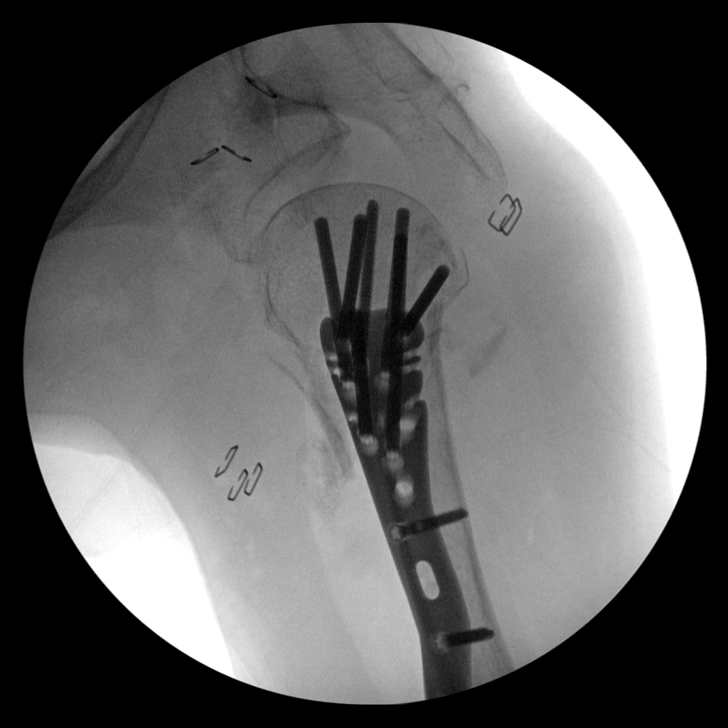

[11 of 11 positions shown; findings below may reference images not displayed]

FINDINGS: Eleven low resolution intraoperative spot views of the left humerus.
Total fluoroscopy time was 45 seconds. Images were obtained during
operative course of surgical plate and multiple screw fixation of
proximal humerus fracture. There is decreased angulation and
displacement. There are displaced fracture fragments noted both
anterior and posterior on the final image.
IMPRESSION: Intraoperative fluoroscopic assistance provided during surgical
fixation of proximal left humerus fracture

## 2018-08-01 IMAGING — DX DG SHOULDER 2+V*L*
4 series · 4 of 4 positions shown · non-contrast
Comparison: 09/11/2017

CLINICAL DATA: Fracture

EXAM:
LEFT SHOULDER - 2+ VIEW

[shoulder grashey (1 of 2)]
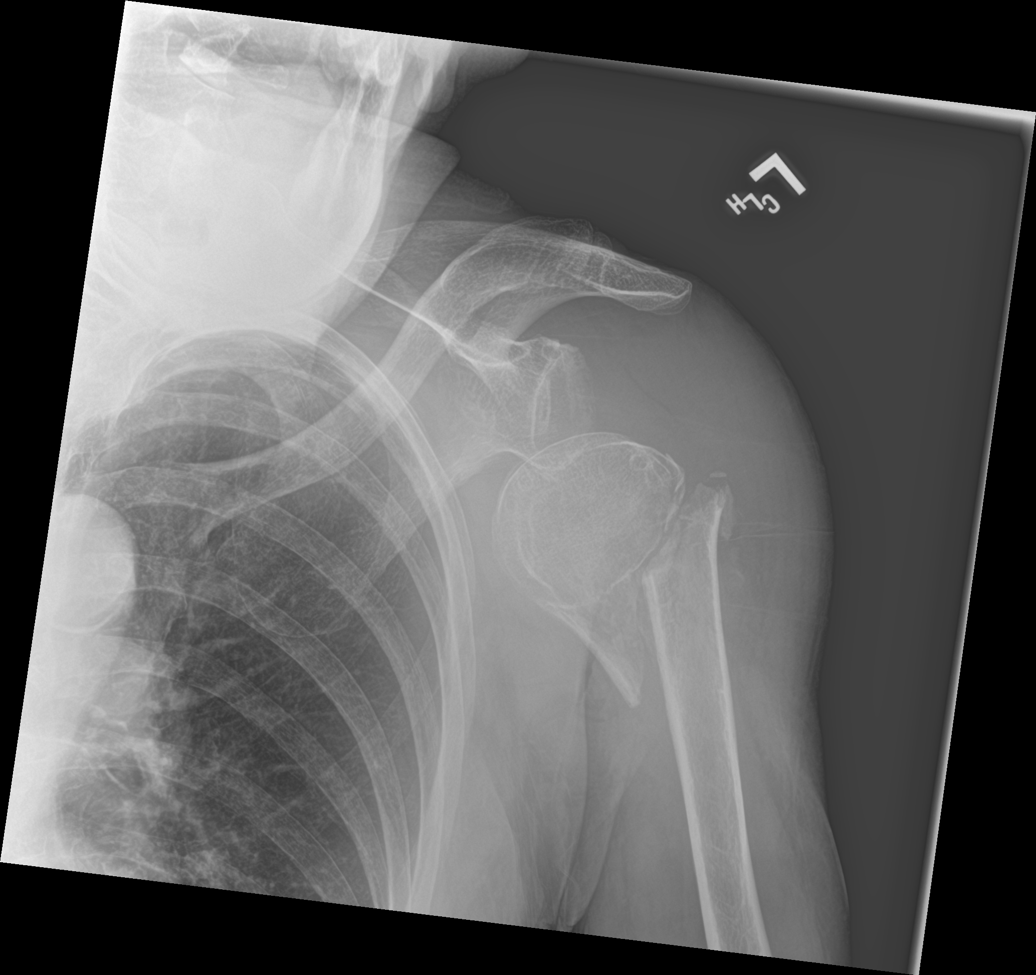

[shoulder y view]
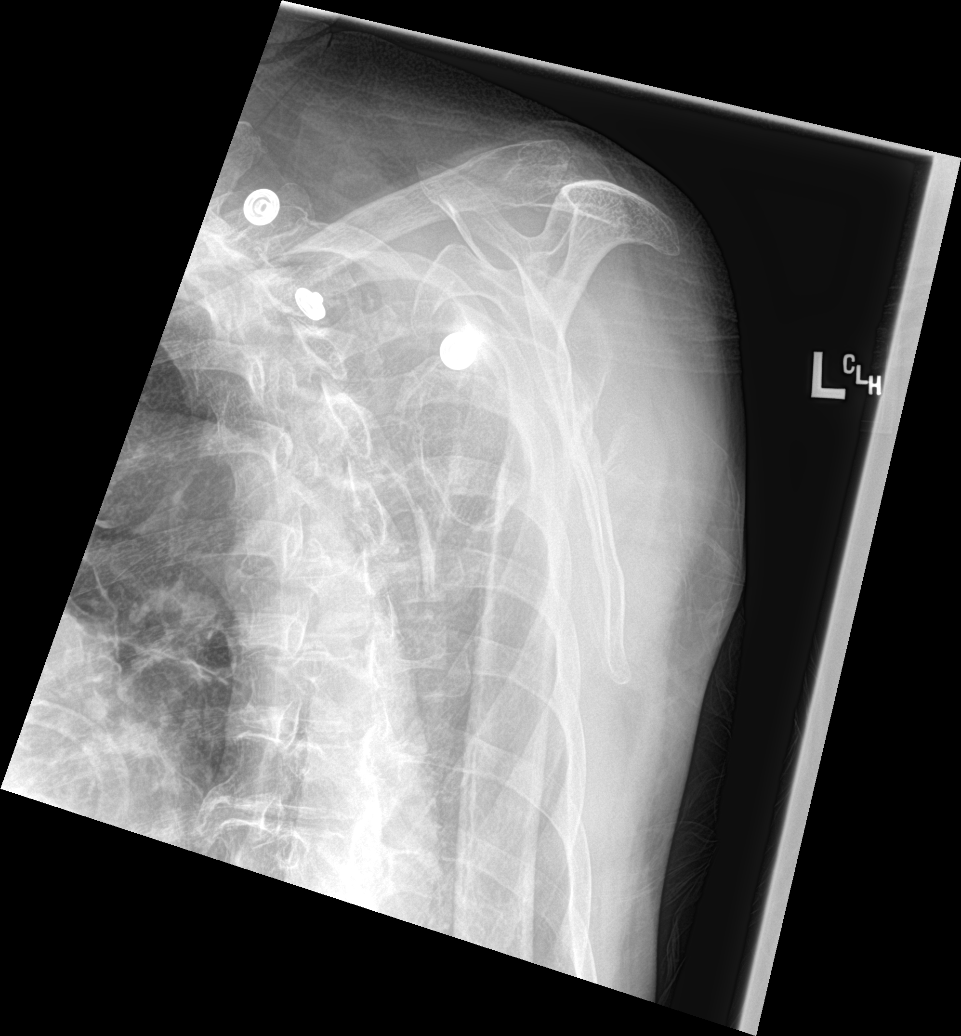

[shoulder axillary]
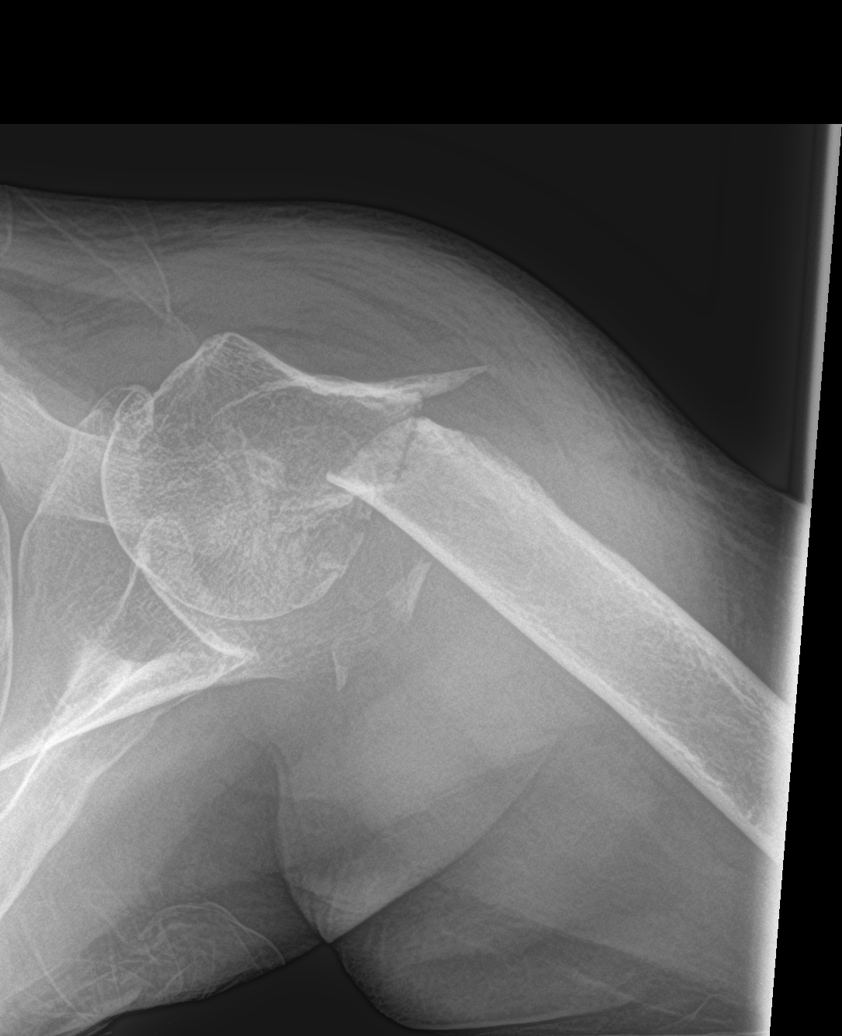

[shoulder grashey (2 of 2)]
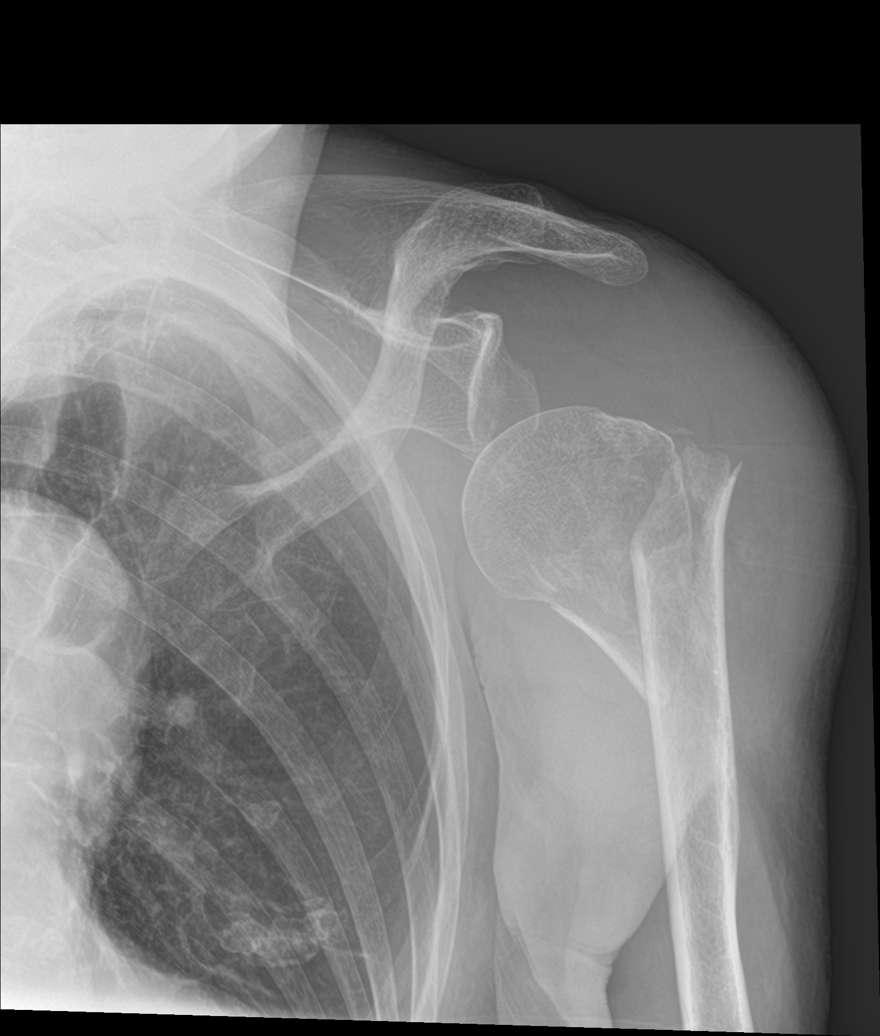

[4 of 4 positions shown; findings below may reference images not displayed]

FINDINGS: Fracture dislocation again noted of the left humerus. Displaced left
humeral neck fracture again noted, unchanged. Anterior/inferior
dislocation of the humeral head.
IMPRESSION: No significant change in the displaced left humeral neck fracture or
dislocation.

## 2018-08-07 DIAGNOSIS — N311 Reflex neuropathic bladder, not elsewhere classified: Secondary | ICD-10-CM | POA: Diagnosis not present

## 2018-08-07 DIAGNOSIS — R351 Nocturia: Secondary | ICD-10-CM | POA: Diagnosis not present

## 2018-08-17 ENCOUNTER — Ambulatory Visit: Payer: Medicare Other | Admitting: Podiatry

## 2018-09-26 ENCOUNTER — Other Ambulatory Visit: Payer: Self-pay | Admitting: Family Medicine

## 2018-09-26 DIAGNOSIS — M159 Polyosteoarthritis, unspecified: Secondary | ICD-10-CM

## 2018-09-26 DIAGNOSIS — M150 Primary generalized (osteo)arthritis: Principal | ICD-10-CM

## 2018-11-14 ENCOUNTER — Other Ambulatory Visit: Payer: Self-pay | Admitting: Family Medicine

## 2018-11-14 DIAGNOSIS — S42292A Other displaced fracture of upper end of left humerus, initial encounter for closed fracture: Secondary | ICD-10-CM

## 2018-11-14 NOTE — Telephone Encounter (Signed)
Pt's son called about his mom needing a refill on her tRAmadoL. Please give pt's son a call back.

## 2018-11-15 MED ORDER — TRAMADOL HCL 50 MG PO TABS: 25.0000 mg | ORAL_TABLET | Freq: Four times a day (QID) | ORAL | 0 refills | Status: DC | PRN

## 2018-11-23 ENCOUNTER — Telehealth: Payer: Self-pay | Admitting: Family Medicine

## 2018-11-23 NOTE — Telephone Encounter (Signed)
**  After Hours/ Emergency Line Call**  Received a call by patient's son to report that Becky Leach has been having episodes of constipation for the past 2 weeks and tonight son noticed that patient had some bright red blood on the toilet paper after patient wiped.  He states that she has been straining to go to the bathroom.  He gave her prune juice today in order to soften her bowel movement, and that's when he noticed a small amount of blood on the paper.  He states that she has a history of fissures, hemorrhoids but she also has a history of stomach ulcers. She is on protonix and does not take NSAIDs. Son  Denies patient having any dark stools.  Patient denies any shortness of breath, chest pain, dizziness, lightheadedness.  Son reports that he will get patient some MiraLAX in order to help soften her bowel movements.    A virtual/video visit was scheduled for 11/26/2018 at 1430. Son was instructed to take patient to the ED if she develops any worsening bleeding or SOB, dizziness or lightheadedness. Son is unable to bring patient in for an office visit as he does not currently have transportation and is on oxygen.  He states that they are trying to avoid going to the doctor's office given his comorbidities, his mother's age and COVID-14 pandemic.  He states that his mom is good at letting him know when she needs to go to the hospital and he has relatives that he can call to take them to the hospital or he can call EMS come. Red flags discussed.  Will forward to PCP and provider who will see patient on Monday.  [place] Zury Fazzino, DO PGY-2, [facility] 11/23/2018 11:27 PM

## 2018-11-26 ENCOUNTER — Other Ambulatory Visit: Payer: Self-pay

## 2018-11-26 ENCOUNTER — Telehealth (INDEPENDENT_AMBULATORY_CARE_PROVIDER_SITE_OTHER): Payer: Medicare Other | Admitting: Family Medicine

## 2018-11-26 DIAGNOSIS — Z8719 Personal history of other diseases of the digestive system: Secondary | ICD-10-CM | POA: Diagnosis not present

## 2018-11-26 NOTE — Assessment & Plan Note (Signed)
patient has a known history of bleeding from her hemorrhoids and anal fissures.  Her last colonoscopy was in 2013.  It is associated with constipation.  Discussed prune juice and MiraLAX.  Son would like to bring the patient in for anoscopy.  Scheduled on 7/7 with Dr. Tammi Klippel.

## 2018-11-26 NOTE — Progress Notes (Signed)
[facility] Telemedicine Visit  Patient consented to have virtual visit. Method of visit: Telephone  Encounter participants: Patient: Becky Leach - located at home Provider: Bufford Lope - located at [facility] Others (if applicable): son  Chief Complaint:  blood in stool  HPI:  Blood in stool Historian is son.  Patient is with him. Patient son states that his mother has been having bright red blood upon wiping for the last several days.  He called the after-hours line.  Per chart review it was recommended that she try prune juice.  He states that he has noticed an improvement in his mother's constipation since starting the prune juice.  Seems to also have lessened the bright red blood per rectum.  Each she went from having about 5 episodes to only 1 or 2.  She does have a history of fissures and hemorrhoids.  She denies any current rectal pain.  Since stopping the prune juice from over the weekend her stools have now become more pellet-like and hard.  She was previously on MiraLAX in the past which seemed to work well but then she started having too many bowel movements and they titrated down to eventually stopping the MiraLAX due to frequent bowel movements.  They are willing to restart.   She has had no lightheadedness or dizziness.  Her blood pressures remained stable.  They have been checking it regularly and has been 120/84.   ROS: per HPI  Pertinent PMHx: History of anal fissures and hemorrhoids   Exam: N/A  Assessment/Plan:  History of hemorrhoids patient has a known history of bleeding from her hemorrhoids and anal fissures.  Her last colonoscopy was in 2013.  It is associated with constipation.  Discussed prune juice and MiraLAX.  Son would like to bring the patient in for anoscopy.  Scheduled on 7/7 with Dr. Tammi Klippel.    Time spent during visit with patient: 18 minutes  Bufford Lope, DO PGY-3, [facility] 11/26/2018 3:07 PM

## 2018-11-28 NOTE — Progress Notes (Signed)
   Subjective:    Patient ID: Becky Leach, female    DOB: [birth year], 85 y.o.   MRN: [id number]   CC: rectal bleeding   HPI: Rectal bleeding Patient presenting with rectal bleeding that began Sunday evening into early Monday morning.  Patient noticed blood in her stool and toilet paper.  Only streaks no frank red blood.  Has a history of internal hemorrhoids.  States that prior to this bleeding she was very constipated.  Denies any pain.  Denies any itching.  States that the bleeding quickly stopped and has not had any further bleeding.  Denies any dizziness or lightheadedness.  States that this has happened to her before due to her hemorrhoids.  Appointment with telemedicine visit on 11/26/2018 with Dr. Shawna Orleans.  At that time patient was felt to have bleeding from hemorrhoids and anal fissures.  Last colonoscopy was in 2013.  Dr. Shawna Orleans felt that symptoms were associated with constipation and recommended prune juice and MiraLAX.  Patient was scheduled for today for a anoscopy. Patient med list shows Eliquis which could contribute.  CBC from 09/14/2017 showing normal platelets.  Hemoglobin at that time was down to 10.4.   Objective:  BP 135/80   Pulse (!) 103   SpO2 96%  Vitals and nursing note reviewed  General: well nourished, in no acute distress HEENT: normocephalic, TM's visualized bilaterally, no scleral icterus or conjunctival pallor, no nasal discharge, moist mucous membranes, good dentition without erythema or discharge noted in posterior oropharynx Neck: supple, non-tender, without lymphadenopathy Cardiac: RRR, clear S1 and S2, no murmurs, rubs, or gallops Respiratory: clear to auscultation bilaterally, no increased work of breathing Abdomen: soft, nontender, nondistended, no masses or organomegaly. Bowel sounds present Extremities: no edema or cyanosis. Warm, well perfused. 2+ radial and PT pulses bilaterally Skin: warm and dry, no rashes noted Neuro: alert and oriented, no focal  deficits Rectal exam: negative without mass, lesions or tenderness.   Assessment & Plan:    History of hemorrhoids Patient with a known history of rectal bleeding from her hemorrhoids.  Rectal exam performed today and hemmocult collected. FOBT neg. Patient also reports no further bleeding.  Patient does have a history of using Eliquis.  Confirmed with both patient and her son that she is taking this daily.  Patient states that Dr. Vaughan Browner placed her on this medication and told her that she needs to be on this lifelong due to a history of pulmonary embolism.  Discussed this with patient and her son who both agree that they want to stay on this medication as patient lives a sedentary lifestyle and they are fearful for getting another PE.  Given patient's age and history of frequent falls I recommend that this is something her PCP follow-up with.  Hemoglobin obtained today to ensure no anemia.  Lab technician was able to verbally confirmed that hemoglobin was 14 today. This is significantly improved from 1 year ago when her hemoglobin was 9.5.  Recommended that patient continue MiraLAX daily or prune juice so that she does not get constipated.  If there is pain she can try sitz bath or Anusol but at this moment I do not feel these are needed.  Strict return precautions given.  Follow-up if no improvement.   Discussed patient with Dr. Erin Hearing  Return if symptoms worsen or fail to improve.   Caroline More, DO, PGY-2

## 2018-11-29 ENCOUNTER — Other Ambulatory Visit: Payer: Self-pay

## 2018-11-29 ENCOUNTER — Ambulatory Visit (INDEPENDENT_AMBULATORY_CARE_PROVIDER_SITE_OTHER): Payer: Medicare Other | Admitting: Family Medicine

## 2018-11-29 VITALS — BP 135/80 | HR 103

## 2018-11-29 DIAGNOSIS — K625 Hemorrhage of anus and rectum: Secondary | ICD-10-CM | POA: Diagnosis not present

## 2018-11-29 DIAGNOSIS — Z8719 Personal history of other diseases of the digestive system: Secondary | ICD-10-CM

## 2018-11-29 LAB — HEMOCCULT GUIAC POC 1CARD (OFFICE): Fecal Occult Blood, POC: NEGATIVE

## 2018-11-29 LAB — POCT HEMOGLOBIN: Hemoglobin: 14 g/dL (ref 11–14.6)

## 2018-11-29 NOTE — Assessment & Plan Note (Addendum)
Patient with a known history of rectal bleeding from her hemorrhoids.  Rectal exam performed today and hemmocult collected. FOBT neg. Patient also reports no further bleeding.  Patient does have a history of using Eliquis.  Confirmed with both patient and her son that she is taking this daily.  Patient states that Dr. Vaughan Browner placed her on this medication and told her that she needs to be on this lifelong due to a history of pulmonary embolism.  Discussed this with patient and her son who both agree that they want to stay on this medication as patient lives a sedentary lifestyle and they are fearful for getting another PE.  Given patient's age and history of frequent falls I recommend that this is something her PCP follow-up with.  Hemoglobin obtained today to ensure no anemia.  Lab technician was able to verbally confirmed that hemoglobin was 14 today. This is significantly improved from 1 year ago when her hemoglobin was 9.5.  Recommended that patient continue MiraLAX daily or prune juice so that she does not get constipated.  If there is pain she can try sitz bath or Anusol but at this moment I do not feel these are needed.  Strict return precautions given.  Follow-up if no improvement.

## 2018-11-29 NOTE — Patient Instructions (Signed)
It was a pleasure seeing you today.   Today we discussed your bleeding  For your bleeding: continue miralax daily or prune juice. If further bleeding please follow up.   Please follow up if no improvement or sooner if symptoms persist or worsen. Please call the clinic immediately if you have any concerns.   Our clinic's number is [phone]. Please call with questions or concerns.   Please go to the emergency room if you have increased bleeding   Thank you,  Caroline More, DO

## 2018-12-04 ENCOUNTER — Ambulatory Visit: Payer: Medicare Other | Admitting: Family Medicine

## 2019-01-03 ENCOUNTER — Other Ambulatory Visit: Payer: Self-pay | Admitting: Student in an Organized Health Care Education/Training Program

## 2019-01-03 DIAGNOSIS — S42292A Other displaced fracture of upper end of left humerus, initial encounter for closed fracture: Secondary | ICD-10-CM

## 2019-01-03 NOTE — Telephone Encounter (Signed)
Pt's son is calling and would like to have his mother's tramadol refilled.

## 2019-01-09 ENCOUNTER — Other Ambulatory Visit: Payer: Self-pay

## 2019-01-09 ENCOUNTER — Ambulatory Visit (INDEPENDENT_AMBULATORY_CARE_PROVIDER_SITE_OTHER): Payer: Medicare Other | Admitting: Pulmonary Disease

## 2019-01-09 ENCOUNTER — Encounter: Payer: Self-pay | Admitting: Pulmonary Disease

## 2019-01-09 DIAGNOSIS — I2699 Other pulmonary embolism without acute cor pulmonale: Secondary | ICD-10-CM

## 2019-01-09 MED ORDER — APIXABAN 5 MG PO TABS: ORAL_TABLET | ORAL | 3 refills | Status: DC

## 2019-01-09 NOTE — Patient Instructions (Signed)
I am glad you are doing well Continue Eliquis.  We will call in a refill Follow-up in 1 year.

## 2019-01-09 NOTE — Addendum Note (Signed)
Addended by: Hildred Alamin I on: 01/09/2019 02:19 PM   Modules accepted: Orders

## 2019-01-09 NOTE — Progress Notes (Signed)
Virtual Visit via Telephone Note  I connected with Becky Leach on 01/09/19 at  2:15 PM EDT by telephone and verified that I am speaking with the correct person using two identifiers.  Location: Patient: Home Provider: [facility], [place], [place]   I discussed the limitations, risks, security and privacy concerns of performing an evaluation and management service by telephone and the availability of in person appointments. I also discussed with the patient that there may be a patient responsible charge related to this service. The patient expressed understanding and agreed to proceed.   History of Present Illness: Chief complaint: Follow-up for pulmonary embolism.   HPI: 85 year old with past history of hypertension.  Admitted in December 2018 with submassive pulmonary embolism.  There are no precipitating factors for pulmonary embolism.  She is never suffered from DVT or pulmonary embolism before.  Although she has osteoarthritis she is still able to ambulate and is not bedbound.  She has had no recent prolonged travel she has had no recent surgeries other than an injection of her right knee.  She does not smoke she is not obese and has no known history of cancer. Pertinent to her risk for anticoagulation she did have peptic ulcer disease in 2013 due to NSAID use which has not recurred.  She has never had any unusual bleeding, she has not had recent surgery on her eyes brain or back. Currently on anticoagulation with eliquis. She had a fall with left humerus fracture requiring ORIF 09/12/2017 and hemorrhoidal bleeding in 2019   Pets: None Occupation: Retired Secretary/administrator Exposures: No known exposures Smoking history: Never smoker Travel History: Not significant   Interim history: Continues on Eliquis with no issues. Breathing is doing well.  No more episodes of bleeding per rectum or fals  Observations/Objective: Submassive pulmonary embolism, right DVT She is  on on Eliquis without any issue Continue indefinite anticoagulation.  Monitor for bleeding  Assessment and Plan: Continue Eliquis  Follow Up Instructions: Follow-up in 1 year   I discussed the assessment and treatment plan with the patient. The patient was provided an opportunity to ask questions and all were answered. The patient agreed with the plan and demonstrated an understanding of the instructions.   The patient was advised to call back or seek an in-person evaluation if the symptoms worsen or if the condition fails to improve as anticipated.  I provided 25 minutes of non-face-to-face time during this encounter.   Marshell Garfinkel MD [facility] 01/09/2019, 2:01 PM

## 2019-01-11 MED ORDER — TRAMADOL HCL 50 MG PO TABS: 25.0000 mg | ORAL_TABLET | Freq: Four times a day (QID) | ORAL | 0 refills | Status: DC | PRN

## 2019-01-14 ENCOUNTER — Telehealth: Payer: Self-pay

## 2019-01-14 NOTE — Telephone Encounter (Signed)
Called and LVM on patient's son's voicemail concerning a telephone appointment with PCP per Dr. Erin Hearing.  Becky Leach, [place]

## 2019-02-12 ENCOUNTER — Telehealth (INDEPENDENT_AMBULATORY_CARE_PROVIDER_SITE_OTHER): Payer: Medicare Other | Admitting: Student in an Organized Health Care Education/Training Program

## 2019-02-12 ENCOUNTER — Other Ambulatory Visit: Payer: Self-pay

## 2019-02-12 DIAGNOSIS — Z7689 Persons encountering health services in other specified circumstances: Secondary | ICD-10-CM

## 2019-02-12 DIAGNOSIS — Z0000 Encounter for general adult medical examination without abnormal findings: Secondary | ICD-10-CM | POA: Insufficient documentation

## 2019-02-12 DIAGNOSIS — S42292A Other displaced fracture of upper end of left humerus, initial encounter for closed fracture: Secondary | ICD-10-CM

## 2019-02-12 MED ORDER — TRAMADOL HCL 50 MG PO TABS: 25.0000 mg | ORAL_TABLET | Freq: Two times a day (BID) | ORAL | 1 refills | Status: DC | PRN

## 2019-02-12 NOTE — Progress Notes (Signed)
Becky Leach Telemedicine Visit  Patient consented to have virtual visit. Method of visit: Telephone  Encounter participants: Patient: Becky Leach - located at home Provider: Richarda Osmond - located at [facility].  Others (if applicable): son, [name]  Chief Complaint: meet new physician.  HPI: Much of history provided by live- in son.   Specific questions about which type of flu shot she should get this year and any other health maintenance that she needs at this time. Patient's son is bring her to the clinic tomorrow at 80 for flu shot-ordered the high-dose flu shot and discussed the recommendation for this with son.  BP-son monitors patient's blood pressure on almost daily basis and states that they are always well controlled in the low 100s.  She denies having any chest pain, shortness of breath, lightheadedness, dizziness. Adherent with eliquis BID.   Arthritis-patient has chronic arthritis of knee and hands for which she takes half to 1 pill of tramadol per day as needed.  Son states that this has good control of her pain at this time.  She was doing physical therapy and up to 20 minutes on a stationary bicycle per day but has been less physical over the past month due to her son's illness and he is her primary caregiver.  He plans on getting her back on the bike and starting a few minutes a day soon now that he feels better.  We discussed this motion is being good for her arthritis.  Patient cannot use Voltaren gel as it causes her to have a rash and itchiness.  She does like Aspercreme.  Patient needs a refill of tramadol at this time.  Urinary incontinence- Sees urologist and gets mybetriq and trospium.  Well-controlled  Health maintenance - DEXA scan desired but wanted to wait until after pandemic to reduce infection risk. History of gastric ulcers-symptoms are well controlled.  Patient needs refill of Protonix which is a long-term medication  and could be contributing negatively to bone health so would like to switch to H2 blocker today.  Constipation/hemorrhoids-well-controlled.  Previous rectal bleeding has improved.  Patient takes MiraLAX and prune juice every day which has resolved symptoms.  Vitamin D deficiency- Vitamin D OTC daily. 35mcg- 1000iu.  Patient has not had frequent falls in recent past.  Last vitamin D level was less than 20 a year ago.  Discussed with son on rechecking this and possibly increasing vitamin D level  ROS: per HPI  Reviewed allergies, and medications.   Pertinent PMHx: urinary incontinence, frequent falls, hx of DVT, PE, gastric ulcers.  Exam:  BP: 102/81, HR: 101, O2:97, Temp: 97.5 (as recorded by son just prior to phone visit) Respiratory: speaking in full sentences Alert & oriented x3.  Assessment/Plan:  Routine check-up No specific concerns today Recommended getting high-dose flu shot this year which she is getting tomorrow Recommended dexa scan which family is delaying with the current pandemic situation Family is being very cautious about restricting contacts Follow up as needed.  Would recommend labs at next visit including rechecking vitamin D.  Recommend switching protonix to H2 blocker. Refilled tramadol for arthritis      Time spent during visit with patient: 25 minutes

## 2019-02-12 NOTE — Assessment & Plan Note (Addendum)
No specific concerns today Recommended getting high-dose flu shot this year which she is getting tomorrow Recommended dexa scan which family is delaying with the current pandemic situation Family is being very cautious about restricting contacts Follow up as needed.  Would recommend labs at next visit including rechecking vitamin D.  Recommend switching protonix to H2 blocker. Refilled tramadol for arthritis

## 2019-02-13 ENCOUNTER — Other Ambulatory Visit: Payer: Self-pay

## 2019-02-13 ENCOUNTER — Ambulatory Visit (INDEPENDENT_AMBULATORY_CARE_PROVIDER_SITE_OTHER): Payer: Medicare Other | Admitting: *Deleted

## 2019-02-13 DIAGNOSIS — Z23 Encounter for immunization: Secondary | ICD-10-CM

## 2019-02-20 ENCOUNTER — Telehealth: Payer: Self-pay | Admitting: Pulmonary Disease

## 2019-02-20 NOTE — Telephone Encounter (Signed)
Spoke with pt's son, [name]. He does not want [place] filling the pt's pantoprazole. This should be coming the pt's PCP only. Advised him that we would not fill this prescription if it comes across our system. Nothing further was needed.

## 2019-02-22 ENCOUNTER — Other Ambulatory Visit: Payer: Self-pay | Admitting: Student in an Organized Health Care Education/Training Program

## 2019-02-22 NOTE — Telephone Encounter (Signed)
This patient has a little bit complicated problem so I am sending to clinic nurse.  They have been having trouble with getting Pantoragolesod (for stomach) filled using [facility] instead of the pulmonary doctor.  It was not 'coded' properly according to patient.  Patient is saying that a prescription was filled through pulmonary doctor without their knowledge through the wrong doctor (not [facility]). Patient does not want this filled through the other doctor office.  So, they want to make sure request sent in this [facility] ([facility]) was through [place] ([facility] and not other office.  (They do not want anything to come through anyone but our office, that shouldn't)  Caretaker who called is just quite frustrated, mainly with [facility].

## 2019-02-26 MED ORDER — PANTOPRAZOLE SODIUM 40 MG PO TBEC: 40.0000 mg | DELAYED_RELEASE_TABLET | Freq: Every day | ORAL | 0 refills | Status: DC

## 2019-02-26 NOTE — Telephone Encounter (Signed)
Returned call to Son.  They are requesting a refill on pantoprazole and only want for pts [place] to fill the pantoprazole to cut down "on complications" since him and his brother take care of her medications.   Sent in a 90 days supply (cheaper in 90 day) since she is almost out.  Have sent request for additional refills to Dr. Ouida Sills.  Christen Bame, CMA

## 2019-02-26 NOTE — Telephone Encounter (Signed)
Patient has still not received a call  about a problem w/script and is almost out of the medication.  Please call them back today asap at [phone].  The patient is getting quite frustrated that no one ever called them back.

## 2019-02-28 NOTE — Telephone Encounter (Signed)
It kept saying overdue.  Since you filled it Tuesday, I will delete this one out. Christen Bame, CMA

## 2019-02-28 NOTE — Telephone Encounter (Signed)
Hey, do you know why I'm getting this request again after I already filled it?

## 2019-03-25 ENCOUNTER — Other Ambulatory Visit: Payer: Self-pay | Admitting: Student in an Organized Health Care Education/Training Program

## 2019-04-01 DIAGNOSIS — L603 Nail dystrophy: Secondary | ICD-10-CM | POA: Diagnosis not present

## 2019-04-01 DIAGNOSIS — I739 Peripheral vascular disease, unspecified: Secondary | ICD-10-CM | POA: Diagnosis not present

## 2019-04-01 DIAGNOSIS — L84 Corns and callosities: Secondary | ICD-10-CM | POA: Diagnosis not present

## 2019-05-10 ENCOUNTER — Other Ambulatory Visit: Payer: Self-pay | Admitting: Student in an Organized Health Care Education/Training Program

## 2019-05-10 DIAGNOSIS — S42292A Other displaced fracture of upper end of left humerus, initial encounter for closed fracture: Secondary | ICD-10-CM

## 2019-05-21 ENCOUNTER — Other Ambulatory Visit: Payer: Self-pay | Admitting: Student in an Organized Health Care Education/Training Program

## 2019-06-19 ENCOUNTER — Ambulatory Visit: Payer: Medicare Other | Admitting: Podiatry

## 2019-06-29 ENCOUNTER — Other Ambulatory Visit: Payer: Self-pay

## 2019-06-29 ENCOUNTER — Emergency Department (HOSPITAL_COMMUNITY)
Admission: EM | Admit: 2019-06-29 | Discharge: 2019-06-29 | Disposition: A | Discharge status: Home / Self Care | Payer: Medicare Other | Attending: Emergency Medicine | Admitting: Emergency Medicine

## 2019-06-29 DIAGNOSIS — Z7901 Long term (current) use of anticoagulants: Secondary | ICD-10-CM | POA: Insufficient documentation

## 2019-06-29 DIAGNOSIS — I10 Essential (primary) hypertension: Secondary | ICD-10-CM | POA: Diagnosis not present

## 2019-06-29 DIAGNOSIS — R000 Tachycardia, unspecified: Secondary | ICD-10-CM | POA: Diagnosis not present

## 2019-06-29 DIAGNOSIS — R58 Hemorrhage, not elsewhere classified: Secondary | ICD-10-CM | POA: Diagnosis not present

## 2019-06-29 DIAGNOSIS — R457 State of emotional shock and stress, unspecified: Secondary | ICD-10-CM | POA: Diagnosis not present

## 2019-06-29 DIAGNOSIS — Z79899 Other long term (current) drug therapy: Secondary | ICD-10-CM | POA: Insufficient documentation

## 2019-06-29 DIAGNOSIS — R040 Epistaxis: Secondary | ICD-10-CM

## 2019-06-29 MED ORDER — CEPHALEXIN 500 MG PO CAPS: 500.0000 mg | ORAL_CAPSULE | Freq: Two times a day (BID) | ORAL | 0 refills | Status: DC

## 2019-06-29 MED ORDER — OXYMETAZOLINE HCL 0.05 % NA SOLN
1.0000 | Freq: Once | NASAL | Status: AC
  Administered 2019-06-29: 1 via NASAL
  Filled 2019-06-29: qty 30

## 2019-06-29 MED ORDER — LIDOCAINE-EPINEPHRINE (PF) 2 %-1:200000 IJ SOLN
INTRAMUSCULAR | Status: AC
  Filled 2019-06-29: qty 20

## 2019-06-29 NOTE — ED Provider Notes (Signed)
Becky Leach Provider Note   CSN: [id number] Arrival date & time: 06/29/19  0127     History Chief Complaint  Patient presents with  . Epistaxis    Becky Leach is a 86 y.o. female.  The history is provided by the patient.  Epistaxis Location:  R nare Severity:  Moderate Timing:  Constant Progression:  Worsening Chronicity:  New Context: anticoagulants   Relieved by:  Nothing Worsened by:  Nothing Patient reports sudden onset of right epistaxis. She reports it started spontaneously.  She is on anticoagulants. No fevers or vomiting.  No headache.     Past Medical History:  Diagnosis Date  . AKI (acute kidney injury) ([place])    due to NSAID  . Arthritis   . Closed fracture of left proximal humerus 09/14/2017  . Constipation   . DVT (deep venous thrombosis) ([place])   . Herniated disc   . Hypertension   . Pulmonary embolism ([place])   . Seasonal allergies   . Spinal stenosis   . Stomach ulcer    due to NSAID  . Tachycardia   . UTI (lower urinary tract infection)   . Vitamin D deficiency 09/14/2017    Patient Active Problem List   Diagnosis Date Noted  . Routine check-up 02/12/2019  . Abdominal discomfort 12/25/2017  . History of hemorrhoids 12/25/2017  . Vitamin D deficiency 09/14/2017  . Closed fracture of left proximal humerus 09/14/2017  . Humeral fracture 09/12/2017  . Thrombocytosis ([place]) 09/12/2017  . Dislocation of left shoulder joint 09/12/2017  . Maxillary sinus fracture ([place]) 09/12/2017  . Acute head trauma 09/12/2017  . Fall 09/12/2017  . At risk for hemorrhage associated with anticoagulation therapy 09/12/2017  . Fever 09/12/2017  . Frequent falls 09/12/2017  . Pulmonary embolism ([place]) 05/11/2017  . Rhinorrhea 03/18/2017  . Impaired ambulation 01/12/2017  . Unilateral primary osteoarthritis, left knee 12/26/2016  . Unilateral primary osteoarthritis, right knee 12/26/2016  . Urge urinary incontinence 11/19/2014    . Pain of left lower extremity 08/28/2014  . Allergic rhinitis 02/04/2014  . Spinal stenosis of lumbar region 11/14/2013  . Benign essential HTN 12/06/2011    Past Surgical History:  Procedure Laterality Date  . COLONOSCOPY  12/21/2011   Procedure: COLONOSCOPY;  Surgeon: Wonda Horner, MD;  Location: [facility];  Service: Endoscopy;  Laterality: N/A;  . DILATION AND CURETTAGE OF UTERUS    . ORIF HUMERUS FRACTURE Left 09/12/2017   Procedure: OPEN REDUCTION INTERNAL FIXATION (ORIF) PROXIMAL  HUMERUS FRACTURE;  Surgeon: Altamese Angelica, MD;  Location: [facility];  Service: Orthopedics;  Laterality: Left;  . TONSILLECTOMY AND ADENOIDECTOMY       OB History    Gravida  4   Para  4   Term      Preterm      AB      Living        SAB      TAB      Ectopic      Multiple      Live Births              Family History  Problem Relation Age of Onset  . Arthritis Mother   . Heart attack Mother     Social History   Tobacco Use  . Smoking status: Never Smoker  . Smokeless tobacco: Never Used  Substance Use Topics  . Alcohol use: No  . Drug use: No    Home Medications Prior to  Admission medications   Medication Sig Start Date End Date Taking? Authorizing Provider  traMADol (ULTRAM) 50 MG tablet TAKE 0.5 TABLET (25MG ) BY MOUTH EVERY 12 HOURS AS NEEDED SEVERE PAIN 05/10/19   Doristine Mango L, DO  acetaminophen (TYLENOL) 325 MG tablet Take 2 tablets (650 mg total) by mouth every 6 (six) hours as needed for pain. 02/27/12   Dhungel, Nishant, MD  apixaban (ELIQUIS) 5 MG TABS tablet Take 1 tablet (5mg  total) twice daily. 01/09/19   Mannam, Hart Robinsons, MD  [facility] D3 2000 units CAPS TAKE 1 TABLET (2,000 UNITS TOTAL) BY MOUTH 2 (TWO) TIMES DAILY. 10/06/17   [name], MD  loratadine (CLARITIN) 10 MG tablet Take 1 tablet (10 mg total) by mouth daily. 01/03/18   Marshell Garfinkel, MD  MYRBETRIQ 50 MG TB24 tablet  11/22/18   [provider]  pantoprazole (PROTONIX) 40 MG tablet  TAKE 1 TABLET BY MOUTH EVERY DAY 05/28/19   Anderson, Chelsey L, DO  solifenacin (VESICARE) 5 MG tablet Take 5 mg by mouth daily.    [provider]  trospium (SANCTURA) 20 MG tablet  09/24/18   [provider]    Allergies    Penicillins  Review of Systems   Review of Systems  HENT: Positive for nosebleeds.   Gastrointestinal: Negative for vomiting.    Physical Exam Updated Vital Signs BP (!) 169/110   Pulse (!) 109   Temp 97.7 F (36.5 C) (Oral)   Resp (!) 21   Ht 1.575 m (5\' 2" )   Wt 46.3 kg   SpO2 96%   BMI 18.66 kg/m   Physical Exam CONSTITUTIONAL: Elderly, no acute distress HEAD: Normocephalic/atraumatic EYES: EOMI/PERRL ENMT: Mucous membranes moist, copious blood in right nare NECK: supple no meningeal signs LUNGS: no apparent distress ABDOMEN: soft NEURO: Pt is awake/alert/appropriate, moves all extremitiesx4.  No facial droop.   EXTREMITIES:full ROM SKIN: warm, color normal PSYCH: no abnormalities of mood noted, alert and oriented to situation  ED Results / Procedures / Treatments   Labs (all labs ordered are listed, but only abnormal results are displayed) Labs Reviewed - No data to display  EKG None  Radiology No results found.  Procedures .Epistaxis Management  Date/Time: 06/29/2019 3:39 AM Performed by: Ripley Fraise, MD Authorized by: Ripley Fraise, MD   Consent:    Consent obtained:  Emergent situation   Consent given by:  Patient   Risks discussed:  Bleeding Anesthesia (see MAR for exact dosages):    Anesthesia method:  Topical application Procedure details:    Treatment site:  R anterior   Treatment method:  Anterior pack   Treatment complexity:  Limited   Treatment episode: initial   Post-procedure details:    Assessment:  Bleeding stopped   Patient tolerance of procedure:  Tolerated well, no immediate complications     Medications Ordered in ED Medications  lidocaine-EPINEPHrine (XYLOCAINE W/EPI) 2  %-1:200000 (PF) injection (has no administration in time range)  oxymetazoline (AFRIN) 0.05 % nasal spray 1 spray (1 spray Each Nare Given 06/29/19 [id number])    ED Course  I have reviewed the triage vital signs and the nursing notes.     MDM Rules/Calculators/A&P                      Patient presents with abrupt onset of epistaxis.  Patient is on Eliquis for PE She had otherwise been well prior to this happening.  She had an episode of heavy bleeding in the ED,  so I emergently placed a Rhino Rocket Bleeding is now improved I discussed the case with her son via the phone.  Discussed need for follow-up and appropriate care of the nasal balloon Also place her on antibiotics to prevent infection. 5:02 AM Patient stable.  No new bleeding. Will discharge home. Final Clinical Impression(s) / ED Diagnoses Final diagnoses:  Right-sided epistaxis    Rx / DC Orders ED Discharge Orders         Ordered    cephALEXin (KEFLEX) 500 MG capsule  2 times daily     06/29/19 0408           Ripley Fraise, MD 06/29/19 0502

## 2019-06-29 NOTE — ED Notes (Signed)
Pt nose began to bleed again when she walked to the bathroom

## 2019-06-29 NOTE — ED Triage Notes (Addendum)
Per EMS - Upon arrival pt nose was severely bleeding. EMS applied pressure during ride to hospital and bleeding is controlled. Hx of HTN, currently unmedicated for HTN because she was experiencing hypotension from meds. Denies headache, weakness, blurred vision.   178 120 110 HR 100% RA 22 R

## 2019-07-01 ENCOUNTER — Other Ambulatory Visit: Payer: Self-pay

## 2019-07-01 ENCOUNTER — Encounter: Payer: Self-pay | Admitting: Pulmonary Disease

## 2019-07-01 ENCOUNTER — Ambulatory Visit (INDEPENDENT_AMBULATORY_CARE_PROVIDER_SITE_OTHER): Payer: Medicare Other | Admitting: Pulmonary Disease

## 2019-07-01 DIAGNOSIS — Z86711 Personal history of pulmonary embolism: Secondary | ICD-10-CM | POA: Diagnosis not present

## 2019-07-01 DIAGNOSIS — I8290 Acute embolism and thrombosis of unspecified vein: Secondary | ICD-10-CM

## 2019-07-01 MED ORDER — APIXABAN 2.5 MG PO TABS: 2.5000 mg | ORAL_TABLET | Freq: Two times a day (BID) | ORAL | 3 refills | Status: DC

## 2019-07-01 NOTE — Assessment & Plan Note (Addendum)
Discussed case with Dr. Vaughan Browner.  I believe we can transition patient down to 2.5 mg twice daily of Eliquis for prophylaxis dosing  Plan: Complete upcoming ENT referral Start Eliquis 2.5 mg twice daily

## 2019-07-01 NOTE — Assessment & Plan Note (Addendum)
Discussion: Patient has had occasional issues with bleeding specifically related to hemorrhoids.  Patient also is at risk for falls.  Patient's most recent emergency room visit on 06/29/2019 for nosebleeds.  Patient has upcoming appointment with [facility] ear nose and throat on 07/05/2019.  Plan: Patient to complete [facility] ENT appointment on 07/05/2019 Start Eliquis 2.5 mg twice daily, DVT/PE prophylaxis dosing

## 2019-07-01 NOTE — Progress Notes (Signed)
[place] Telemedicine Visit I connected with  Becky Leach on 07/02/19 by a video enabled telemedicine application and verified that I am speaking with the correct person using two identifiers.   I discussed the limitations of evaluation and management by telemedicine. The patient expressed understanding and agreed to proceed.  Patient consented to have virtual visit. Method of visit: Video was attempted, but technology challenges prevented patient from using video, so visit was conducted via telephone.  Encounter participants: Patient: Becky Leach - located at Home Provider: Caroline More - located at [facility] Others (if applicable): [name] (son)  Chief Complaint: ED f/u   HPI: ED f/u Seen in ED on 1/30 for epistaxis. H/o Eliquis use for unprovoked PE. Was placed on rhino rocket and bleeding improved. Was placed on prophylactic keflex to prevent infection. Recently seen by pulmonology on 2/1 and has upcoming ENT appointment with [facility] on 07/05/19. Pulmonology has decreased eliquis to 2.5mg  bid. Has a telephone visit with pulmonology on Friday as well. Seeing Dr. Elisha Headland at ENT on Friday at [place] vaccine scheduled for Friday as well.   Patient was seen by EMS at time of event, they tried to stop the blood but it contined. BP at that time was in the 123456 systolic. Was on BP meds but taken off in 2017 due to hypotension. Since then has had no issues. Recent vitals are as follows: BP 109/74, 117/90. Pulse 114. Temp 97.4, O2 sat 94%. No dizziness or LH. Nose somewhat makes noise 2/2 baloon.   Is wondering what to do for BP since it was elevated when EMS arrived but now back to her BL. Recent stressors: death in family, anxiety of bleed. Patient is otherwise feeling well. Appetite has come down, but 2/2 death in family.   ROS: per HPI  Pertinent PMHx: HTN, VTE, h/o PE  Exam:  Respiratory: no increased WOB  Assessment/Plan:  Epistaxis Doing well since discharge  from the emergency department.  Has follow-up with both ENT and pulmonology coming up.  Will defer anticoagulation dosing to pulmonology as they are following her for her PE.  Recently Eliquis was decreased to 2.5 mg twice daily.  I am happy that patient is doing well.  Blood pressures are low but per her son this is her baseline.  Anticipate her recent high blood pressures were secondary to stress and pain.  I advised no medications at this time for blood pressure management.  Strict return precautions given.  Patient to follow-up with ENT for further epistaxis management.  Follow-[facility] with [facility] as needed for this.  Benign essential HTN Seems to be at baseline.  No new medications at this time.  Follow-up with PCP.    Time spent during visit with patient: 15 minutes

## 2019-07-01 NOTE — Patient Instructions (Addendum)
You were seen today by Lauraine Rinne, NP  for:   1. VTE (venous thromboembolism) 2. History of pulmonary embolus (PE)  Complete follow-up with ear nose and throat  We will transition to Eliquis 2.5 mg tablets Take 1 tablet (2.5 mg) twice daily  This is prophylaxis dosing for your history of DVT/PE  We will have a televisit with you on 07/05/2019 to follow-up with you   Follow Up:    Return in about 4 days (around 07/05/2019), or if symptoms worsen or fail to improve, for Follow up with Wyn Quaker FNP-C.   Please do your part to reduce the spread of COVID-19:      Reduce your risk of any infection  and COVID19 by using the similar precautions used for avoiding the common cold or flu:  Marland Kitchen Wash your hands often with soap and warm water for at least 20 seconds.  If soap and water are not readily available, use an alcohol-based hand sanitizer with at least 60% alcohol.  . If coughing or sneezing, cover your mouth and nose by coughing or sneezing into the elbow areas of your shirt or coat, into a tissue or into your sleeve (not your hands). Langley Gauss A MASK when in public  . Avoid shaking hands with others and consider head nods or verbal greetings only. . Avoid touching your eyes, nose, or mouth with unwashed hands.  . Avoid close contact with people who are sick. . Avoid places or events with large numbers of people in one location, like concerts or sporting events. . If you have some symptoms but not all symptoms, continue to monitor at home and seek medical attention if your symptoms worsen. . If you are having a medical emergency, call 911.   [place] / e-Visit: [email or web address]         [facility]: [facility]: [id number]                   [facility]: [id number]     It is flu season:   >>> Best ways to protect  herself from the flu: Receive the yearly flu vaccine, practice good hand hygiene washing with soap and also using hand sanitizer when available, eat a nutritious meals, get adequate rest, hydrate appropriately   Please contact the office if your symptoms worsen or you have concerns that you are not improving.   Thank you for choosing [facility] for your healthcare, and for allowing [place] to partner with you on your healthcare journey. I am thankful to be able to provide care to you today.   Wyn Quaker FNP-C

## 2019-07-01 NOTE — Progress Notes (Signed)
Virtual Visit via Telephone Note  I connected with Becky Leach on 07/01/19 at  3:30 PM EST by telephone and verified that I am speaking with the correct person using two identifiers.  Location: Patient: Home Provider: [facility] - [id number], [place], [place], [place] [address]   I discussed the limitations, risks, security and privacy concerns of performing an evaluation and management service by telephone and the availability of in person appointments. I also discussed with the patient that there may be a patient responsible charge related to this service. The patient expressed understanding and agreed to proceed.  Patient consented to consult via telephone: Yes People present and their role in pt care: Pt   History of Present Illness:  86 year old female never smoker followed in our office for history of PE DVT (2018) -felt to be unprovoked  Past medical history: Hypertension, rhinorrhea, at risk for falls Smoking history: Never smoker Maintenance: Eliquis 5 mg twice daily Patient of Dr. Vaughan Browner  Chief complaint: [place] question   86 year old female never smoker followed in our office for history of PE in 2018.  She been maintained on Eliquis 5 mg twice daily.  Family contacted our office to see if she would benefit from having a PT/INR as patient was seen in the emergency room on 06/29/2019 for a persistent nosebleed.  They were able to finally get the bleeding stopped at the emergency room and she is an upcoming ENT appointment later on this week with [facility] on [address]. appointment with [facility] in the morning of 07/05/2019.  Patient still maintained on 5 mg of Eliquis twice daily due to history of PE as well as right DVT.   Observations/Objective:  07/01/19 - BP - 117/80 07/01/19 - HR - 108 07/01/19 - Temp - 97.4 07/01/19 - SPO2 - 95 on RA   05/11/2017-echocardiogram-LV ejection fraction 60 to 65%, right ventricle cavity size is mildly  dilated, moderate pulmonary hypertension  05/12/2017-lower extremity ultrasound - bilaterally- right: no cystic structure found in the popliteal fossa, DVT noted in femoral vein and common femoral vein, not up to the level of saphenofemoral junction, left: There is no evidence of DVT in the lower extremity  Social History   Tobacco Use  Smoking Status Never Smoker  Smokeless Tobacco Never Used   Immunization History  Administered Date(s) Administered  . Fluad Quad(high Dose 65+) 02/13/2019  . Influenza,inj,Quad PF,6+ Mos 02/04/2014, 03/17/2015, 02/10/2016, 03/10/2017, 03/19/2018  . Pneumococcal Conjugate-13 02/04/2014  . Pneumococcal Polysaccharide-23 12/18/2011  . Tdap 09/11/2017   Assessment and Plan:  History of pulmonary embolus (PE) Discussed case with Dr. Vaughan Browner.  I believe we can transition patient down to 2.5 mg twice daily of Eliquis for prophylaxis dosing  Plan: Complete upcoming ENT referral Start Eliquis 2.5 mg twice daily   VTE (venous thromboembolism) Discussion: Patient has had occasional issues with bleeding specifically related to hemorrhoids.  Patient also is at risk for falls.  Patient's most recent emergency room visit on 06/29/2019 for nosebleeds.  Patient has upcoming appointment with [facility] ear nose and throat on 07/05/2019.  Plan: Patient to complete [facility] appointment on 07/05/2019 Start Eliquis 2.5 mg twice daily, DVT/PE prophylaxis dosing   Follow Up Instructions:  Return in about 4 days (around 07/05/2019), or if symptoms worsen or fail to improve, for Follow up with Wyn Quaker FNP-C.   I discussed the assessment and treatment plan with the patient. The patient was provided  an opportunity to ask questions and all were answered. The patient agreed with the plan and demonstrated an understanding of the instructions.   The patient was advised to call back or seek an in-person evaluation if the symptoms worsen or if the condition fails to  improve as anticipated.  I provided 25 minutes of non-face-to-face time during this encounter.   Lauraine Rinne, NP

## 2019-07-02 ENCOUNTER — Other Ambulatory Visit: Payer: Self-pay

## 2019-07-02 ENCOUNTER — Telehealth (INDEPENDENT_AMBULATORY_CARE_PROVIDER_SITE_OTHER): Payer: Medicare Other | Admitting: Family Medicine

## 2019-07-02 DIAGNOSIS — R040 Epistaxis: Secondary | ICD-10-CM | POA: Insufficient documentation

## 2019-07-02 DIAGNOSIS — I10 Essential (primary) hypertension: Secondary | ICD-10-CM | POA: Diagnosis not present

## 2019-07-02 NOTE — Assessment & Plan Note (Signed)
Seems to be at baseline.  No new medications at this time.  Follow-up with PCP.

## 2019-07-02 NOTE — Assessment & Plan Note (Signed)
Doing well since discharge from the emergency department.  Has follow-up with both ENT and pulmonology coming up.  Will defer anticoagulation dosing to pulmonology as they are following her for her PE.  Recently Eliquis was decreased to 2.5 mg twice daily.  I am happy that patient is doing well.  Blood pressures are low but per her son this is her baseline.  Anticipate her recent high blood pressures were secondary to stress and pain.  I advised no medications at this time for blood pressure management.  Strict return precautions given.  Patient to follow-up with ENT for further epistaxis management.  Follow-up with [facility] as needed for this.

## 2019-07-04 NOTE — Progress Notes (Signed)
Virtual Visit via Telephone Note  I connected with Becky Leach on 07/05/19 at  4:15 PM EST by telephone and verified that I am speaking with the correct person using two identifiers.  Location: Patient: Home Provider: Office [facility] - [id number], [place], [place], [place] [address]   I discussed the limitations, risks, security and privacy concerns of performing an evaluation and management service by telephone and the availability of in person appointments. I also discussed with the patient that there may be a patient responsible charge related to this service. The patient expressed understanding and agreed to proceed.  Patient consented to consult via telephone: Yes People present and their role in pt care: Pt    History of Present Illness:  86 year old female never smoker followed in our office for history of PE DVT (2018) -felt to be unprovoked  Past medical history: Hypertension, rhinorrhea, at risk for falls Smoking history: Never smoker Maintenance: Eliquis 2.5 mg twice daily Patient of Dr. Vaughan Browner  Chief complaint: 4-day follow-up, after ENT visit  86 year old female never smoker followed in our office for history of unprovoked pulmonary embolism in 2018.  Patient was then managed on Eliquis 5 mg twice daily.  In 2020 patient had occasional hemorrhoids and rectal bleeding.  This is since resolved.  On 06/29/2019 patient was seen in the emergency room for right-sided epistaxis.  Patient completed a televisit with our office on 07/01/2019.  After discussing her symptoms as well as the severity of her bleeding with her son we decided to change patient's Eliquis dosing to prophylaxis dosing of Eliquis 2.5 mg twice daily.  Patient has been maintained on this now for the past couple of days.  Patient completed ENT follow-up today.  They agreed with decreasing Eliquis dosing down per patient's son.  They have also recommended nasal saline gel.   Patient also received her  first dose of the COVID-19 vaccine from the [place] dose today.  Observations/Objective:  05/11/2017-echocardiogram-LV ejection fraction 60 to 65%, right ventricle cavity size is mildly dilated, moderate pulmonary hypertension  05/12/2017-lower extremity ultrasound - bilaterally- right: no cystic structure found in the popliteal fossa, DVT noted in femoral vein and common femoral vein, not up to the level of saphenofemoral junction, left: There is no evidence of DVT in the lower extremity   Social History   Tobacco Use  Smoking Status Never Smoker  Smokeless Tobacco Never Used   Immunization History  Administered Date(s) Administered  . Fluad Quad(high Dose 65+) 02/13/2019  . Influenza,inj,Quad PF,6+ Mos 02/04/2014, 03/17/2015, 02/10/2016, 03/10/2017, 03/19/2018  . Pneumococcal Conjugate-13 02/04/2014  . Pneumococcal Polysaccharide-23 12/18/2011  . Tdap 09/11/2017   Received first dose of COVID-19 vaccine today   Assessment and Plan:  Epistaxis Completed follow-up with ENT today Doing well since discharge from the emergency room  Plan: Continue Eliquis 2.5 mg twice daily  VTE (venous thromboembolism) Plan: Continue Eliquis 2.5 mg twice daily Follow-up with Dr. Vaughan Browner in 3 months  History of pulmonary embolus (PE) Plan: Continue Eliquis 2.5 mg twice daily Follow-up with our office in 3 months   Follow Up Instructions:  Return in about 3 months (around 10/02/2019), or if symptoms worsen or fail to improve.   I discussed the assessment and treatment plan with the patient. The patient was provided an opportunity to ask questions and all were answered. The patient agreed with the plan and demonstrated an understanding of the instructions.   The patient was advised to call back or seek  an in-person evaluation if the symptoms worsen or if the condition fails to improve as anticipated.  I provided 22 minutes of non-face-to-face time during this encounter.   Lauraine Rinne,  NP

## 2019-07-05 ENCOUNTER — Ambulatory Visit (INDEPENDENT_AMBULATORY_CARE_PROVIDER_SITE_OTHER): Payer: Medicare Other | Admitting: Pulmonary Disease

## 2019-07-05 ENCOUNTER — Other Ambulatory Visit: Payer: Self-pay

## 2019-07-05 ENCOUNTER — Encounter: Payer: Self-pay | Admitting: Pulmonary Disease

## 2019-07-05 DIAGNOSIS — Z86711 Personal history of pulmonary embolism: Secondary | ICD-10-CM

## 2019-07-05 DIAGNOSIS — R040 Epistaxis: Secondary | ICD-10-CM | POA: Diagnosis not present

## 2019-07-05 DIAGNOSIS — I8290 Acute embolism and thrombosis of unspecified vein: Secondary | ICD-10-CM | POA: Diagnosis not present

## 2019-07-05 NOTE — Assessment & Plan Note (Signed)
Completed follow-up with ENT today Doing well since discharge from the emergency room  Plan: Continue Eliquis 2.5 mg twice daily

## 2019-07-05 NOTE — Patient Instructions (Addendum)
You were seen today by Lauraine Rinne, NP  for:   1. VTE (venous thromboembolism) 2. History of pulmonary embolus (PE)  Continue Eliquis 2.5 mg twice daily  3. Epistaxis Continue follow-up with the ENT and primary care  Agree with using nasal saline gel   Follow Up:    Return in about 3 months (around 10/02/2019), or if symptoms worsen or fail to improve, for Follow up with Dr. Vaughan Browner.   Please do your part to reduce the spread of COVID-19:      Reduce your risk of any infection  and COVID19 by using the similar precautions used for avoiding the common cold or flu:  Marland Kitchen Wash your hands often with soap and warm water for at least 20 seconds.  If soap and water are not readily available, use an alcohol-based hand sanitizer with at least 60% alcohol.  . If coughing or sneezing, cover your mouth and nose by coughing or sneezing into the elbow areas of your shirt or coat, into a tissue or into your sleeve (not your hands). Langley Gauss A MASK when in public  . Avoid shaking hands with others and consider head nods or verbal greetings only. . Avoid touching your eyes, nose, or mouth with unwashed hands.  . Avoid close contact with people who are sick. . Avoid places or events with large numbers of people in one location, like concerts or sporting events. . If you have some symptoms but not all symptoms, continue to monitor at home and seek medical attention if your symptoms worsen. . If you are having a medical emergency, call 911.   [place] / e-Visit: [email or web address]         [facility]: [facility]: [id number]                   [facility]: [id number]     It is flu season:   >>> Best ways to protect herself from the flu: Receive the yearly flu vaccine, practice good hand hygiene washing with soap and also using hand  sanitizer when available, eat a nutritious meals, get adequate rest, hydrate appropriately   Please contact the office if your symptoms worsen or you have concerns that you are not improving.   Thank you for choosing [facility] for your healthcare, and for allowing [place] to partner with you on your healthcare journey. I am thankful to be able to provide care to you today.   Wyn Quaker FNP-C

## 2019-07-05 NOTE — Assessment & Plan Note (Signed)
Plan: Continue Eliquis 2.5 mg twice daily Follow-up with our office in 3 months

## 2019-07-05 NOTE — Assessment & Plan Note (Signed)
Plan: Continue Eliquis 2.5 mg twice daily Follow-up with Dr. Vaughan Browner in 3 months

## 2019-07-09 ENCOUNTER — Ambulatory Visit: Payer: Medicare Other | Admitting: Pulmonary Disease

## 2019-07-15 ENCOUNTER — Telehealth (INDEPENDENT_AMBULATORY_CARE_PROVIDER_SITE_OTHER): Payer: Medicare Other | Admitting: Student in an Organized Health Care Education/Training Program

## 2019-07-15 ENCOUNTER — Other Ambulatory Visit: Payer: Self-pay

## 2019-07-15 DIAGNOSIS — M170 Bilateral primary osteoarthritis of knee: Secondary | ICD-10-CM

## 2019-07-15 DIAGNOSIS — R262 Difficulty in walking, not elsewhere classified: Secondary | ICD-10-CM

## 2019-07-15 DIAGNOSIS — I739 Peripheral vascular disease, unspecified: Secondary | ICD-10-CM | POA: Diagnosis not present

## 2019-07-15 DIAGNOSIS — L84 Corns and callosities: Secondary | ICD-10-CM | POA: Diagnosis not present

## 2019-07-15 DIAGNOSIS — L603 Nail dystrophy: Secondary | ICD-10-CM | POA: Diagnosis not present

## 2019-07-15 NOTE — Progress Notes (Signed)
[place] Telemedicine Visit  Patient consented to have virtual visit. Method of visit: Video was attempted, but technology challenges prevented patient from using video, so visit was conducted via telephone.  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 Has taken injections in the past but is declining further treatments. Currently uses seated walker to transport around house.   Son takes blood pressures regularly. BP 110/68 on average. Patient is asymptomatic of hypotension. Not on any blood pressure medications. Decreased blood thinner medication to reduce risk of bleed by pulmonology s/p epistaxis. Son will bring in patient for check up once she and he have had both doses of vaccine which should be soon.  ROS: per HPI  Pertinent PMHx: h/o frequent falls  Exam:  spoke with son.  Assessment/Plan:  Bilateral primary osteoarthritis of knee Patient's son requests a wheelchair. Recommended evaluation for equipment need by PT prior to prescription to ensure getting proper device. Son agreed to this plan. Put in referral for home health PT.  Will await their recommendations    Time spent during visit with patient: 14 minutes

## 2019-07-17 NOTE — Assessment & Plan Note (Signed)
Patient's son requests a wheelchair. Recommended evaluation for equipment need by PT prior to prescription to ensure getting proper device. Son agreed to this plan. Put in referral for home health PT.  Will await their recommendations

## 2019-07-22 ENCOUNTER — Telehealth: Payer: Self-pay | Admitting: Student in an Organized Health Care Education/Training Program

## 2019-07-22 NOTE — Telephone Encounter (Signed)
Son calls back.  I was on the phone with him for 40+ minutes.  He is upset for several reasons:  1. He called Medicare and was told PT is NOT required for a manual wheelchair. He states that his mom still does some PT at home that she learned a couple years ago from [facility] PT and does not want to go thru that again.  2. He is also upset that he did not get calls back in a timely manner.  After speaking with patient my impression was that he wanted to "talk it out".  He and his brothers have decided to purchase W/C for her.   I think the confusion came in because we typically only send patients for PT wheelchair evals for electric wheelchairs.  For a manual one, only a script from the provider is needed.  [facility] to [facility].  Offered to reach out to [employer] tomorrow and see if I was missing anything but Son would prefer to "just go ahead and purchase the wheelchair". Apologized for delay in getting his mom the W/C, asked him to call back if they changed their mind.   Christen Bame, CMA

## 2019-07-22 NOTE — Telephone Encounter (Signed)
Called patients son to discuss wheelchair problem again. No answer so left voicemail.  I recommended she be evaluated by PT to assess the proper equipment needed at home since they have difficulty with transportation.  They have also been recommended to go to the neuro wheelchair clinic for evaluation.  Son has declined both of these options and would like to purchase a wheelchair himself.  In the voicemail, I expressed that he is free to make that decision and does not need a prescription for that option.  Asked him to call back to the clinic if he has any further questions.

## 2019-07-29 ENCOUNTER — Telehealth: Payer: Medicare Other | Admitting: Student in an Organized Health Care Education/Training Program

## 2019-09-11 ENCOUNTER — Other Ambulatory Visit: Payer: Self-pay | Admitting: Student in an Organized Health Care Education/Training Program

## 2019-09-11 DIAGNOSIS — S42292A Other displaced fracture of upper end of left humerus, initial encounter for closed fracture: Secondary | ICD-10-CM

## 2019-09-11 NOTE — Telephone Encounter (Signed)
Did not refill -- Unsure of indication. Will defer to PCP.

## 2019-09-16 ENCOUNTER — Other Ambulatory Visit: Payer: Self-pay

## 2019-09-16 DIAGNOSIS — S42292A Other displaced fracture of upper end of left humerus, initial encounter for closed fracture: Secondary | ICD-10-CM

## 2019-09-16 NOTE — Telephone Encounter (Signed)
Patient scheduled virtual visit for 09/30/19. Patient's son states that they are still not wanting to come into office due to [place]. Does patient need to schedule sooner with next available provider or can patient receive refill to make it to appointment on 5/3.   To PCP  Talbot Grumbling, RN

## 2019-09-16 NOTE — Telephone Encounter (Signed)
Patient's son calls nurse line regarding tramadol refill request. Patient has scheduled virtual visit for 09/30/19 for follow up. Patient submitted refill request on April 14th. Per Dr. Maudie Mercury, unsure of indication so medication was not refilled.   To PCP  Please advise  Talbot Grumbling, RN

## 2019-09-16 NOTE — Telephone Encounter (Signed)
Called and informed [name]) of below information. [name] will call back to office to reschedule appointment after discussing with mother and brother.   Talbot Grumbling, RN

## 2019-09-24 ENCOUNTER — Other Ambulatory Visit: Payer: Self-pay | Admitting: Pulmonary Disease

## 2019-09-24 ENCOUNTER — Other Ambulatory Visit: Payer: Self-pay | Admitting: Student in an Organized Health Care Education/Training Program

## 2019-09-24 DIAGNOSIS — I8290 Acute embolism and thrombosis of unspecified vein: Secondary | ICD-10-CM

## 2019-09-24 DIAGNOSIS — Z86711 Personal history of pulmonary embolism: Secondary | ICD-10-CM

## 2019-09-30 ENCOUNTER — Telehealth (INDEPENDENT_AMBULATORY_CARE_PROVIDER_SITE_OTHER): Payer: Medicare Other | Admitting: Student in an Organized Health Care Education/Training Program

## 2019-09-30 ENCOUNTER — Other Ambulatory Visit: Payer: Self-pay

## 2019-09-30 DIAGNOSIS — Z5320 Procedure and treatment not carried out because of patient's decision for unspecified reasons: Secondary | ICD-10-CM | POA: Diagnosis not present

## 2019-09-30 MED ORDER — TETANUS-DIPHTH-ACELL PERTUSSIS 5-2.5-18.5 LF-MCG/0.5 IM SUSP: 0.5000 mL | Freq: Once | INTRAMUSCULAR | 0 refills | Status: DC

## 2019-09-30 NOTE — Assessment & Plan Note (Signed)
Refused tramadol refill as I have not had appointment with this patient. They declined to come in and are declining any new refills at this time as patient feels well enough without it.  I encouraged them to come in for an appointment ASAP and they will try to fit it into their schedule.

## 2019-09-30 NOTE — Progress Notes (Signed)
[place] Telemedicine Visit  Patient consented to have virtual visit and was identified by name and date of birth. Method of visit: Telephone  Encounter participants: Patient: Becky Leach - located at home Provider: Richarda Osmond - located at [facility] Others (if applicable): son Gaspar Bidding)  Chief Complaint: medication refill  HPI: Pharmacy request was denied for tramadol and I requested an appointment with patient to discuss this medication as I have never met her in person.  Called and spoke with Becky Leach and her son Gaspar Bidding who is also her main caregiver about the tramadol. They refused an in person appointment in the past due to concerns for Covid. Becky Leach is now fully vaccinated since February and they are still declining in person appointment now due to their schedules being too busy to bring her in. Aaron Edelman states that his other brother is the one who would be responsible for transportation to appointments and he will speak with him again about when he is available to bring her in. For now, Becky Leach is saying that she rarely takes the tramadol and is not requiring it at this time so they are not requesting a refill anymore. They deny having any other questions or concerns at this time.  ROS: per HPI  Pertinent PMHx: arthritis  Exam:  Respiratory: groggy voice as she had just woken up. Also seemed a little disoriented but son said this is her baseline when she wakes up.   Assessment/Plan:  Medication refused Refused tramadol refill as I have not had appointment with this patient. They declined to come in and are declining any new refills at this time as patient feels well enough without it.  I encouraged them to come in for an appointment ASAP and they will try to fit it into their schedule.     Time spent during visit with patient: 18 minutes

## 2019-10-14 DIAGNOSIS — L84 Corns and callosities: Secondary | ICD-10-CM | POA: Diagnosis not present

## 2019-10-14 DIAGNOSIS — I739 Peripheral vascular disease, unspecified: Secondary | ICD-10-CM | POA: Diagnosis not present

## 2019-10-14 DIAGNOSIS — L603 Nail dystrophy: Secondary | ICD-10-CM | POA: Diagnosis not present

## 2019-12-15 ENCOUNTER — Other Ambulatory Visit: Payer: Self-pay | Admitting: Student in an Organized Health Care Education/Training Program

## 2019-12-16 NOTE — Telephone Encounter (Signed)
Becky Leach (son) is calling saying he will schedule his mother a appointment to come in as soon as they can. He had been hospitalized for 2 weeks and his mother is going to have surgery on her eyes the first surgery is July the 28th. Son said if doctor has any other questions Dr can call son Becky Leach [phone]. Thanks

## 2019-12-24 DIAGNOSIS — H2512 Age-related nuclear cataract, left eye: Secondary | ICD-10-CM | POA: Diagnosis not present

## 2019-12-24 DIAGNOSIS — H25043 Posterior subcapsular polar age-related cataract, bilateral: Secondary | ICD-10-CM | POA: Diagnosis not present

## 2019-12-24 DIAGNOSIS — H401131 Primary open-angle glaucoma, bilateral, mild stage: Secondary | ICD-10-CM | POA: Diagnosis not present

## 2019-12-24 DIAGNOSIS — H18413 Arcus senilis, bilateral: Secondary | ICD-10-CM | POA: Diagnosis not present

## 2019-12-24 DIAGNOSIS — H25013 Cortical age-related cataract, bilateral: Secondary | ICD-10-CM | POA: Diagnosis not present

## 2019-12-24 DIAGNOSIS — H2513 Age-related nuclear cataract, bilateral: Secondary | ICD-10-CM | POA: Diagnosis not present

## 2020-02-17 ENCOUNTER — Encounter: Payer: Self-pay | Admitting: Family Medicine

## 2020-02-17 ENCOUNTER — Ambulatory Visit (INDEPENDENT_AMBULATORY_CARE_PROVIDER_SITE_OTHER): Payer: Medicare Other | Admitting: Family Medicine

## 2020-02-17 ENCOUNTER — Ambulatory Visit (HOSPITAL_COMMUNITY)
Admission: RE | Admit: 2020-02-17 | Discharge: 2020-02-17 | Disposition: A | Discharge status: Home / Self Care | Payer: Medicare Other | Source: Ambulatory Visit | Attending: Family Medicine | Admitting: Family Medicine

## 2020-02-17 ENCOUNTER — Other Ambulatory Visit: Payer: Self-pay

## 2020-02-17 VITALS — BP 134/84 | HR 115 | Wt 115.4 lb

## 2020-02-17 DIAGNOSIS — I10 Essential (primary) hypertension: Secondary | ICD-10-CM | POA: Insufficient documentation

## 2020-02-17 DIAGNOSIS — Z0000 Encounter for general adult medical examination without abnormal findings: Secondary | ICD-10-CM | POA: Diagnosis not present

## 2020-02-17 DIAGNOSIS — R7309 Other abnormal glucose: Secondary | ICD-10-CM

## 2020-02-17 DIAGNOSIS — Z23 Encounter for immunization: Secondary | ICD-10-CM | POA: Diagnosis not present

## 2020-02-17 LAB — POCT GLYCOSYLATED HEMOGLOBIN (HGB A1C): Hemoglobin A1C: 5.4 % (ref 4.0–5.6)

## 2020-02-17 NOTE — Patient Instructions (Addendum)
Thank you for coming to see me today. It was a pleasure.   Your blood pressure at the clinic was 134/82 and 130/80, which is good.  I have scheduled an appointment with Dr Valentina Lucks for a 24 blood pressure monitoring.  The appointment is here at the clinic tomorrow 9/21 at 945 am.    You will have some blood work today and I will MyChart you the results.   You should pay attention to your hemoglobin A1C.  It is a three month test about your average blood sugar. If the A1C is - <7.0 is great.  That is our goal for treating you. - Between 7.0 and 9.0 is not so good.  We would need to work to do better. - Above 9.0 is terrible.  You would really need to work with [place] to get it under control.     Please follow-up with PCP in 1 week  If you have any questions or concerns, please do not hesitate to call the office at [phone].  Best,   Carollee Leitz, MD [facility]

## 2020-02-17 NOTE — Progress Notes (Signed)
    SUBJECTIVE:   CHIEF COMPLAINT / HPI: Blood pressure check  Hypertension Patient was scheduled to have cataract surgery.  Pre op BP check 191/119, 158/118 and 180/122 with HR 95.  She denies any chest pain, SOB, headaches or generalized pain.  Her son reports that her BP at home between 120-170.  Patient reports has not taken BP meds in 2-3 years as BP always wnl.  PERTINENT  PMH / PSH:  HTN- controlled DM Type 1  OBJECTIVE:   BP 134/84   Pulse (!) 115   Wt 115 lb 6.4 oz (52.3 kg)   SpO2 95%   BMI 21.11 kg/m    General: Alert and oriented, no apparent distress  Cardiovascular: RRR with no murmurs noted Respiratory: CTA bilaterally     ASSESSMENT/PLAN:   Benign essential HTN BP 134/82, 130/80.  HR 115,   She had been fasting for cataract surgery which was cancelled secondary to elevated BP.   -EKG ST no STEMI -Bmet -HbA1c -24 hr BP monitoring scheduled for 09/21 at 945am with Dr Valentina Lucks.   Lyndel Safe score 0.4% MICA risk -Will complete surgery forms once BP monitoring completed -Follow up with PCP in 1 week     Carollee Leitz, MD [place]

## 2020-02-17 NOTE — Assessment & Plan Note (Addendum)
BP 134/82, 130/80.  HR 115,   She had been fasting for cataract surgery which was cancelled secondary to elevated BP.   -EKG ST no STEMI -Bmet -HbA1c -24 hr BP monitoring scheduled for 09/21 at 945am with Dr Valentina Lucks.   Lyndel Safe score 0.4% MICA risk -Will complete surgery forms once BP monitoring completed -Follow up with PCP in 1 week

## 2020-02-18 ENCOUNTER — Ambulatory Visit (INDEPENDENT_AMBULATORY_CARE_PROVIDER_SITE_OTHER): Payer: Medicare Other | Admitting: Pharmacist

## 2020-02-18 ENCOUNTER — Encounter: Payer: Self-pay | Admitting: Pharmacist

## 2020-02-18 DIAGNOSIS — Z23 Encounter for immunization: Secondary | ICD-10-CM | POA: Diagnosis not present

## 2020-02-18 DIAGNOSIS — I10 Essential (primary) hypertension: Secondary | ICD-10-CM | POA: Diagnosis not present

## 2020-02-18 LAB — BASIC METABOLIC PANEL
BUN/Creatinine Ratio: 9 — ABNORMAL LOW (ref 12–28)
BUN: 13 mg/dL (ref 8–27)
CO2: 21 mmol/L (ref 20–29)
Calcium: 9.9 mg/dL (ref 8.7–10.3)
Chloride: 107 mmol/L — ABNORMAL HIGH (ref 96–106)
Creatinine, Ser: 1.37 mg/dL — ABNORMAL HIGH (ref 0.57–1.00)
GFR calc Af Amer: 40 mL/min/{1.73_m2} — ABNORMAL LOW (ref >59)
GFR calc non Af Amer: 34 mL/min/{1.73_m2} — ABNORMAL LOW (ref >59)
Glucose: 118 mg/dL — ABNORMAL HIGH (ref 65–99)
Potassium: 4.2 mmol/L (ref 3.5–5.2)
Sodium: 144 mmol/L (ref 134–144)

## 2020-02-18 NOTE — Progress Notes (Signed)
   S:    Patient arrives in wheelchair, accompanied by her son and Chauncey Reading, Becky Leach.  Presents to the clinic for ambulatory blood pressure evaluation.   Patient was referred on 02/17/2020 by Dr. Volanda Napoleon.  Patient has not been seen by new Primary Care Provider, Dr. Doristine Mango.    Diagnosed with Hypertension in the past but currently is NOT taking an blood pressure medications.  Son repor  Medication compliance is reported to be good.  Family assists patient.   Discussed procedure for wearing the monitor and gave patient written instructions. Monitor was placed on non-dominant arm with instructions to return in the morning.   Current BP Medications include:  None.   O:   Physical Exam Constitutional:      Appearance: Normal appearance.  Pulmonary:     Effort: Pulmonary effort is normal.  Neurological:     Mental Status: She is alert.  Psychiatric:        Mood and Affect: Mood normal.        Behavior: Behavior normal.        Thought Content: Thought content normal.      Review of Systems  All other systems reviewed and are negative.   Last 3 Office BP readings: BP Readings from Last 3 Encounters:  02/17/20 134/84  06/29/19 (!) 162/136  11/29/18 144/81    Basic Metabolic Panel    Component Value Date/Time   NA 144 02/17/2020 1529   K 4.2 02/17/2020 1529   CL 107 (H) 02/17/2020 1529   CO2 21 02/17/2020 1529   GLUCOSE 118 (H) 02/17/2020 1529   GLUCOSE 110 (H) 09/12/2017 0203   BUN 13 02/17/2020 1529   CREATININE 1.37 (H) 02/17/2020 1529   CREATININE 1.00 11/13/2013 1615   CALCIUM 9.9 02/17/2020 1529   GFRNONAA 34 (L) 02/17/2020 1529   GFRAA 40 (L) 02/17/2020 1529    ABPM Study Data: Arm Placement left arm   Overall Mean 24hr BP:   123/83 mmHg HR: 96   Daytime Mean BP:  124/85 mmHg HR: 99   Nighttime Mean BP:  120/78 mmHg HR: 89   Dipping Pattern: No.  Sys:   2.6%   Dia: 8.9%   [normal dipping ~10-20%]  Non-hypertensive ABPM thresholds: daytime BP  <125/75 mmHg, sleeptime BP <120/70 mmHg    A/P: History of hypertension with recent elevated blood pressure immediately prior to a cataract procedure.  Patient found to have excellent control and likely an element of white coat hypertension (blood presssure at goal) prior to procedures.  - 24-hour ambulatory blood pressure demonstrates consistent control with an average blood pressure of 123/83 mmHg, and a nocturnal dipping pattern that is minimal.  No changes to medications suggested.   Day #2 - patient's son dropped off meter.  I called HCPOA with results.  Agreed to forward results to Dr. Volanda Napoleon for completion of Ophthalmology procedure "clearance".    Results reviewed and written information provided.  Total time in face-to-face counseling 12 minutes.   F/U Clinic Visit with Dr. Prince Rome in future.

## 2020-02-18 NOTE — Patient Instructions (Addendum)
Wearing the Blood Pressure Monitor  The cuff will inflate every 20 minutes during the day and every 30 minutes while you sleep.  Your blood pressure readings will NOT display after cuff inflation  Fill out the blood pressure-activity diary during the day, especially during activities that may affect your reading -- such as exercise, stress, walking, taking your blood pressure medications  Important things to know:  Avoid taking the monitor off for the next 24 hours, unless it causes you discomfort or pain.  Do NOT get the monitor wet and do NOT dry to clean the monitor with any cleaning products.  Do NOT put the monitor on anyone else's arm.  When the cuff inflates, avoid excess movement. Let the cuffed arm hang loosely, slightly away from the body. Avoid flexing the muscles or moving the hand/fingers.  When you go to sleep, make sure that the hose is not kinked.  Remember to fill out the blood pressure activity diary.  If you experience severe pain or unusual pain (not associated with getting your blood pressure checked), remove the monitor.  Troubleshooting:  Code  Troubleshooting   1  Check cuff position, tighten cuff   2, 3  Remain still during reading   4, 87  Check air hose connections and make sure cuff is tight   85, 89  Check hose connections and make tubing is not crimped   86  Push START/STOP to restart reading   88, 91  Retry by pushing START/STOP   90  Replace batteries. If problem persists, remove monitor and bring back to   clinic at follow up   97, 98, 99  Service required - Remove monitor and bring back to clinic at follow up   Blood Pressure Activity Diary Time Lying down/ Sleeping Walking/ Exercise Stressed/ Angry Headache/ Pain Dizzy  9 AM       10 AM       11 AM       12 PM       1 PM       2 PM       Time Lying down/ Sleeping Walking/ Exercise Stressed/ Angry Headache/ Pain Dizzy  3 PM       4 PM        5 PM       6 PM       7 PM       8 PM        Time Lying down/ Sleeping Walking/ Exercise Stressed/ Angry Headache/ Pain Dizzy  9 PM       10 PM       11 PM       12 AM       1 AM       2 AM       3 AM       Time Lying down/ Sleeping Walking/ Exercise Stressed/ Angry Headache/ Pain Dizzy  4 AM       5 AM       6 AM       7 AM       8 AM       9 AM       10 AM        Time you woke up: _________                  Time you went to sleep:__________    Come back tomorrow at 8:30   to  have the monitor removed  Call the [facility] if you have any questions before then [phone])   Day #2 - See phone note  9/22  At goal - no change suggested.

## 2020-02-19 ENCOUNTER — Telehealth: Payer: Self-pay | Admitting: Pharmacist

## 2020-02-19 NOTE — Progress Notes (Signed)
Reviewed: Agree with Dr. Koval's documentation and management. 

## 2020-02-19 NOTE — Assessment & Plan Note (Signed)
History of hypertension with recent elevated blood pressure immediately prior to a cataract procedure.  Patient found to have excellent control and likely an element of white coat hypertension (blood presssure at goal) prior to procedures.  - 24-hour ambulatory blood pressure demonstrates consistent control with an average blood pressure of 123/83 mmHg, and a nocturnal dipping pattern that is minimal.  No changes to medications suggested.

## 2020-02-19 NOTE — Telephone Encounter (Signed)
Noted and reviewed. 

## 2020-02-19 NOTE — Telephone Encounter (Signed)
Contacted patient's HCPOA - son, Becky Leach.  Shared Amb BP monitoring results.   He requested that we complete BP form from [facility] and return to them via fax.   I shared that I would forward results to Dr. Volanda Napoleon for her review.  I shared that our office would return form soon.

## 2020-02-20 ENCOUNTER — Telehealth: Payer: Self-pay | Admitting: Student in an Organized Health Care Education/Training Program

## 2020-03-09 DIAGNOSIS — H2512 Age-related nuclear cataract, left eye: Secondary | ICD-10-CM | POA: Diagnosis not present

## 2020-03-10 DIAGNOSIS — H2511 Age-related nuclear cataract, right eye: Secondary | ICD-10-CM | POA: Diagnosis not present

## 2020-03-13 ENCOUNTER — Other Ambulatory Visit: Payer: Self-pay | Admitting: Family Medicine

## 2020-03-15 ENCOUNTER — Other Ambulatory Visit: Payer: Self-pay | Admitting: Pulmonary Disease

## 2020-03-15 DIAGNOSIS — I8290 Acute embolism and thrombosis of unspecified vein: Secondary | ICD-10-CM

## 2020-03-15 DIAGNOSIS — Z86711 Personal history of pulmonary embolism: Secondary | ICD-10-CM

## 2020-03-30 DIAGNOSIS — H2511 Age-related nuclear cataract, right eye: Secondary | ICD-10-CM | POA: Diagnosis not present

## 2020-06-25 ENCOUNTER — Other Ambulatory Visit: Payer: Self-pay | Admitting: Student in an Organized Health Care Education/Training Program

## 2020-07-01 ENCOUNTER — Ambulatory Visit: Payer: Medicare Other | Admitting: Student in an Organized Health Care Education/Training Program

## 2020-07-08 ENCOUNTER — Other Ambulatory Visit: Payer: Self-pay

## 2020-07-08 ENCOUNTER — Encounter: Payer: Self-pay | Admitting: Student in an Organized Health Care Education/Training Program

## 2020-07-08 ENCOUNTER — Ambulatory Visit (INDEPENDENT_AMBULATORY_CARE_PROVIDER_SITE_OTHER): Payer: Medicare Other | Admitting: Student in an Organized Health Care Education/Training Program

## 2020-07-08 VITALS — BP 120/90 | HR 104 | Ht 65.0 in | Wt 112.4 lb

## 2020-07-08 DIAGNOSIS — R000 Tachycardia, unspecified: Secondary | ICD-10-CM | POA: Diagnosis not present

## 2020-07-08 DIAGNOSIS — N1831 Chronic kidney disease, stage 3a: Secondary | ICD-10-CM

## 2020-07-08 DIAGNOSIS — K219 Gastro-esophageal reflux disease without esophagitis: Secondary | ICD-10-CM | POA: Diagnosis not present

## 2020-07-08 DIAGNOSIS — N179 Acute kidney failure, unspecified: Secondary | ICD-10-CM | POA: Diagnosis not present

## 2020-07-08 NOTE — Progress Notes (Signed)
    SUBJECTIVE:   Chief compliant/HPI: annual examination  Becky Leach is a 87 y.o. who presents today for an annual exam.  No specific concerns today from patient or her son.  Vitals stable with exception of mild tachycardia. Patient is asymptomatic. Denies chest pain, palpitations, flutters, syncopal episodes, swelling. Chart review reveals patient has chronic tachycardia. Weight is overall stable, although patient endorses having a low appetite.   Medications reviewed. Taking all prescribed medications as directed. Additionally, taking lantropost and another PRN eye drop from ophthalmology. Patient has glaucoma and recent cataract surgery (10/21). As well as flonase.  Changes discussed during medication review- discontinuing protonix and instead trialing an OTC histamine blocker such as pepcid to help minimize long-term side effects of PPI. Patient and son were in agreeance to this trial.   Healthcare maintenance- patient is UTD for cancer screenings.   Sleep- nocturia x2-3/night and is greatly improved with myrbetriq   Nutrition- poor appetite. Denies dysphagia, odynphagia, teeth/oral pain. Has h/o GERD. Drinks a lot of water. Interested in trying appetite stimulant.   CKD- BMP 9/21 showed slightly worsening kidney function which could be an AKI or progression of disease. Agreeable to rechecking today.  OBJECTIVE:   BP 120/90   Pulse (!) 104   Ht 5\' 5"  (1.651 m)   Wt 112 lb 6 oz (51 kg)   SpO2 97%   BMI 18.70 kg/m   General: NAD, frail, pleasant, able to participate in exam Cardiac: regular and rapid rate. Systolic ejection murmur present.  Respiratory: CTAB, normal effort, No wheezes, rales or rhonchi Abdomen: soft, mildly tender to LUQ, nondistended, no hepatic or splenomegaly, +BS Extremities: no edema. WWP. Skin: warm and dry, no rashes noted Neuro: alert and oriented Psych: Normal affect and mood  ASSESSMENT/PLAN:   Tachycardia Mild, chronic, asymptomatic and  improved with recheck Continue to drink plenty of fluids  GERD (gastroesophageal reflux disease) Trial of Pepcid. Discontinuing protonix   CKD (chronic kidney disease) stage 3, GFR 30-59 ml/min (HCC) Rechecked BMP/CBC to assess kidney function. Stable with last check which is gradually worsening.  Called and discussed with patient's son. Avoid nephrotoxic agents Recheck 6 months    Follow up in 1 year or sooner if indicated.    [place]

## 2020-07-08 NOTE — Patient Instructions (Addendum)
It was a pleasure to see you today!  To summarize our discussion for this visit:  You look overall really good today. We are going to make a few changes today  Trial of not taking protonix. Instead, try over the counter pepcid or the generic famotidine.  We are going to try Remeron to help with appetite stimulation  I'm checking in on your kidney function as it was reduced at last check.  Let's recheck on how you are doing with these changes in about 6 months    Some additional health maintenance measures we should update are: Health Maintenance Due  Topic Date Due  . COVID-19 Vaccine (1) Never done  . DEXA SCAN  Never done  .    Call the clinic at [phone] if your symptoms worsen or you have any concerns.   Thank you for allowing me to take part in your care,  Dr. Doristine Mango

## 2020-07-09 LAB — BASIC METABOLIC PANEL
BUN/Creatinine Ratio: 9 — ABNORMAL LOW (ref 12–28)
BUN: 12 mg/dL (ref 8–27)
CO2: 21 mmol/L (ref 20–29)
Calcium: 10.2 mg/dL (ref 8.7–10.3)
Chloride: 107 mmol/L — ABNORMAL HIGH (ref 96–106)
Creatinine, Ser: 1.31 mg/dL — ABNORMAL HIGH (ref 0.57–1.00)
GFR calc Af Amer: 42 mL/min/{1.73_m2} — ABNORMAL LOW (ref >59)
GFR calc non Af Amer: 36 mL/min/{1.73_m2} — ABNORMAL LOW (ref >59)
Glucose: 93 mg/dL (ref 65–99)
Potassium: 4.7 mmol/L (ref 3.5–5.2)
Sodium: 143 mmol/L (ref 134–144)

## 2020-07-09 LAB — CBC
Hematocrit: 44.9 % (ref 34.0–46.6)
Hemoglobin: 14.9 g/dL (ref 11.1–15.9)
MCH: 28.7 pg (ref 26.6–33.0)
MCHC: 33.2 g/dL (ref 31.5–35.7)
MCV: 87 fL (ref 79–97)
Platelets: 219 10*3/uL (ref 150–450)
RBC: 5.19 x10E6/uL (ref 3.77–5.28)
RDW: 14.8 % (ref 11.7–15.4)
WBC: 4.3 10*3/uL (ref 3.4–10.8)

## 2020-07-10 DIAGNOSIS — K219 Gastro-esophageal reflux disease without esophagitis: Secondary | ICD-10-CM | POA: Insufficient documentation

## 2020-07-10 DIAGNOSIS — N183 Chronic kidney disease, stage 3 unspecified: Secondary | ICD-10-CM | POA: Insufficient documentation

## 2020-07-10 NOTE — Assessment & Plan Note (Signed)
Rechecked BMP/CBC to assess kidney function. Stable with last check which is gradually worsening.  Called and discussed with patient's son. Avoid nephrotoxic agents Recheck 6 months

## 2020-07-10 NOTE — Assessment & Plan Note (Signed)
Trial of Pepcid. Discontinuing protonix

## 2020-07-10 NOTE — Assessment & Plan Note (Signed)
Mild, chronic, asymptomatic and improved with recheck Continue to drink plenty of fluids

## 2020-07-14 ENCOUNTER — Other Ambulatory Visit: Payer: Self-pay

## 2020-07-14 ENCOUNTER — Ambulatory Visit (INDEPENDENT_AMBULATORY_CARE_PROVIDER_SITE_OTHER): Payer: Medicare Other | Admitting: Pulmonary Disease

## 2020-07-14 ENCOUNTER — Encounter: Payer: Self-pay | Admitting: Pulmonary Disease

## 2020-07-14 VITALS — BP 140/82 | HR 85 | Temp 97.3°F | Ht 65.0 in | Wt 113.2 lb

## 2020-07-14 DIAGNOSIS — Z86711 Personal history of pulmonary embolism: Secondary | ICD-10-CM | POA: Diagnosis not present

## 2020-07-14 DIAGNOSIS — I8290 Acute embolism and thrombosis of unspecified vein: Secondary | ICD-10-CM

## 2020-07-14 NOTE — Patient Instructions (Signed)
I am glad you are doing well with no more episodes of bleeding Continue the blood thinner as prescribed Follow-up in 1 year.

## 2020-07-14 NOTE — Progress Notes (Signed)
Becky Leach    [id number]    Oct 29, 1929  Primary Care Physician:Anderson, Carlynn Herald, DO  Referring Physician: Richarda Osmond, DO [address],  [place] [address]  Chief complaint: Follow-up for pulmonary embolism.  HPI: 87 year old with past history of hypertension.  Admitted in May 11, 2017 with tachycardia, dizziness.  She had a CTA which showed submassive pulmonary embolism.  There are no precipitating factors for pulmonary embolism.  She is never suffered from DVT or pulmonary embolism before.  Although she has osteoarthritis she is still able to ambulate and is not bedbound.  She has had no recent prolonged travel she has had no recent surgeries other than an injection of her right knee.  She does not smoke she is not obese and has no known history of cancer. Pertinent to her risk for anticoagulation she did have peptic ulcer disease in 2013 due to NSAID use which has not recurred.  She has never had any unusual bleeding, she has not had recent surgery on her eyes brain or back.  She was anticoagulated with heparin and discharged on Eliquis.   She had a fall  with left humerus fracture requiring ORIF 09/12/2017 Had an episode of epistaxis in January 2021 which was packed by ENT.  Eliquis dose changed to prophylaxis dose at 2.5 mg twice daily  Pets: None Occupation: Retired Secretary/administrator Exposures: No known exposures Smoking history: Never smoker Travel History: Not significant  Interim history: Continues on Eliquis 2.5 mg twice daily.  States that breathing is unchanged with no new complaints She does not have any more episodes of bleeding.  Outpatient Encounter Medications as of 07/14/2020  Medication Sig  . acetaminophen (TYLENOL) 325 MG tablet Take 2 tablets (650 mg total) by mouth every 6 (six) hours as needed for pain.  . [employer] D3 2000 units CAPS TAKE 1 TABLET (2,000 UNITS TOTAL) BY MOUTH 2 (TWO) TIMES DAILY.  Marland Kitchen ELIQUIS 2.5 MG TABS tablet TAKE  1 TABLET BY MOUTH TWICE A DAY  . fluticasone (FLONASE) 50 MCG/ACT nasal spray Place into both nostrils daily.  Marland Kitchen latanoprost (XALATAN) 0.005 % ophthalmic solution 1 drop at bedtime.  Marland Kitchen loratadine (CLARITIN) 10 MG tablet Take 1 tablet (10 mg total) by mouth daily.  Marland Kitchen MYRBETRIQ 50 MG TB24 tablet   . pantoprazole (PROTONIX) 40 MG tablet Take by mouth.  . traMADol (ULTRAM) 50 MG tablet TAKE 0.5 TABLET (25MG ) BY MOUTH EVERY 12 HOURS AS NEEDED SEVERE PAIN   No facility-administered encounter medications on file as of 07/14/2020.    Physical Exam: Blood pressure 140/82, pulse 85, temperature (!) 97.3 F (36.3 C), temperature source Temporal, height 5\' 5"  (1.651 m), weight 113 lb 3.2 oz (51.3 kg), SpO2 95 %. Gen:      No acute distress, frail, elderly HEENT:  EOMI, sclera anicteric Neck:     No masses; no thyromegaly Lungs:    Clear to auscultation bilaterally; normal respiratory effort CV:         Regular rate and rhythm; no murmurs Abd:      + bowel sounds; soft, non-tender; no palpable masses, no distension Ext:    No edema; adequate peripheral perfusion Skin:      Warm and dry; no rash Neuro: alert and oriented x 3 Psych: normal mood and affect  Data Reviewed: Echocardiogram 05/11/17-LVEF 60-65%, high ventricular filling pressure, Right ventricle is mildly dilated.  Peak PA pressure 52.  Moderate pulmonary hypertension.  CT angiogram 05/11/17- extensive bilateral pulmonary embolism.  RV/LV ratio 1.6.  I have reviewed the images personally.  Lower extremity duplex 05/12/17-DVT in the right femoral and common femoral vein.  Assessment/Plan: Follow-up for submassive pulmonary embolism, right DVT She continues on Eliquis at a lower dose without any issue Would recommend indefinite anticoagulation for unprovoked venous thromboembolism.    Noted to have peptic ulcer in 2013 secondary to NSAID use Epistaxis in January 2021 with no recurrence Continue monitoring for any recurrence of  bleed  Follow-up in 1 year.  Marshell Garfinkel MD [facility] 07/14/2020, 11:00 AM  CC: Richarda Osmond, DO

## 2020-08-22 ENCOUNTER — Observation Stay (HOSPITAL_COMMUNITY)
Admission: EM | Admit: 2020-08-22 | Discharge: 2020-08-24 | Disposition: A | Discharge status: Home / Self Care | Payer: Medicare Other | Attending: Family Medicine | Admitting: Family Medicine

## 2020-08-22 ENCOUNTER — Encounter (HOSPITAL_COMMUNITY): Payer: Self-pay | Admitting: Emergency Medicine

## 2020-08-22 ENCOUNTER — Emergency Department (HOSPITAL_COMMUNITY): Payer: Medicare Other

## 2020-08-22 DIAGNOSIS — R000 Tachycardia, unspecified: Secondary | ICD-10-CM | POA: Diagnosis not present

## 2020-08-22 DIAGNOSIS — I10 Essential (primary) hypertension: Secondary | ICD-10-CM | POA: Diagnosis not present

## 2020-08-22 DIAGNOSIS — G500 Trigeminal neuralgia: Secondary | ICD-10-CM | POA: Insufficient documentation

## 2020-08-22 DIAGNOSIS — Z79899 Other long term (current) drug therapy: Secondary | ICD-10-CM | POA: Diagnosis not present

## 2020-08-22 DIAGNOSIS — Z86711 Personal history of pulmonary embolism: Secondary | ICD-10-CM

## 2020-08-22 DIAGNOSIS — I8290 Acute embolism and thrombosis of unspecified vein: Secondary | ICD-10-CM

## 2020-08-22 DIAGNOSIS — K219 Gastro-esophageal reflux disease without esophagitis: Secondary | ICD-10-CM | POA: Insufficient documentation

## 2020-08-22 DIAGNOSIS — G459 Transient cerebral ischemic attack, unspecified: Secondary | ICD-10-CM | POA: Diagnosis not present

## 2020-08-22 DIAGNOSIS — D45 Polycythemia vera: Secondary | ICD-10-CM | POA: Insufficient documentation

## 2020-08-22 DIAGNOSIS — R4781 Slurred speech: Secondary | ICD-10-CM | POA: Diagnosis not present

## 2020-08-22 DIAGNOSIS — I129 Hypertensive chronic kidney disease with stage 1 through stage 4 chronic kidney disease, or unspecified chronic kidney disease: Secondary | ICD-10-CM | POA: Diagnosis not present

## 2020-08-22 DIAGNOSIS — R531 Weakness: Secondary | ICD-10-CM | POA: Diagnosis present

## 2020-08-22 DIAGNOSIS — N183 Chronic kidney disease, stage 3 unspecified: Secondary | ICD-10-CM | POA: Diagnosis not present

## 2020-08-22 DIAGNOSIS — Z880 Allergy status to penicillin: Secondary | ICD-10-CM | POA: Diagnosis not present

## 2020-08-22 DIAGNOSIS — R1310 Dysphagia, unspecified: Secondary | ICD-10-CM | POA: Diagnosis not present

## 2020-08-22 DIAGNOSIS — R0902 Hypoxemia: Secondary | ICD-10-CM | POA: Diagnosis not present

## 2020-08-22 DIAGNOSIS — R29810 Facial weakness: Secondary | ICD-10-CM | POA: Diagnosis not present

## 2020-08-22 DIAGNOSIS — R001 Bradycardia, unspecified: Secondary | ICD-10-CM | POA: Diagnosis not present

## 2020-08-22 DIAGNOSIS — R4182 Altered mental status, unspecified: Secondary | ICD-10-CM | POA: Diagnosis present

## 2020-08-22 DIAGNOSIS — R29818 Other symptoms and signs involving the nervous system: Secondary | ICD-10-CM | POA: Diagnosis not present

## 2020-08-22 DIAGNOSIS — Z20822 Contact with and (suspected) exposure to covid-19: Secondary | ICD-10-CM | POA: Insufficient documentation

## 2020-08-22 DIAGNOSIS — M170 Bilateral primary osteoarthritis of knee: Secondary | ICD-10-CM | POA: Insufficient documentation

## 2020-08-22 LAB — CBC
HCT: 46.8 % — ABNORMAL HIGH (ref 36.0–46.0)
Hemoglobin: 15.7 g/dL — ABNORMAL HIGH (ref 12.0–15.0)
MCH: 29.1 pg (ref 26.0–34.0)
MCHC: 33.5 g/dL (ref 30.0–36.0)
MCV: 86.8 fL (ref 80.0–100.0)
Platelets: 228 10*3/uL (ref 150–400)
RBC: 5.39 MIL/uL — ABNORMAL HIGH (ref 3.87–5.11)
RDW: 14.7 % (ref 11.5–15.5)
WBC: 4.8 10*3/uL (ref 4.0–10.5)
nRBC: 0 % (ref 0.0–0.2)

## 2020-08-22 LAB — DIFFERENTIAL
Abs Immature Granulocytes: 0.01 10*3/uL (ref 0.00–0.07)
Basophils Absolute: 0 10*3/uL (ref 0.0–0.1)
Basophils Relative: 0 %
Eosinophils Absolute: 0.1 10*3/uL (ref 0.0–0.5)
Eosinophils Relative: 2 %
Immature Granulocytes: 0 %
Lymphocytes Relative: 34 %
Lymphs Abs: 1.7 10*3/uL (ref 0.7–4.0)
Monocytes Absolute: 0.4 10*3/uL (ref 0.1–1.0)
Monocytes Relative: 8 %
Neutro Abs: 2.7 10*3/uL (ref 1.7–7.7)
Neutrophils Relative %: 56 %

## 2020-08-22 LAB — URINALYSIS, ROUTINE W REFLEX MICROSCOPIC
Bilirubin Urine: NEGATIVE
Glucose, UA: NEGATIVE mg/dL
Hgb urine dipstick: NEGATIVE
Ketones, ur: NEGATIVE mg/dL
Leukocytes,Ua: NEGATIVE
Nitrite: NEGATIVE
Protein, ur: NEGATIVE mg/dL
Specific Gravity, Urine: 1.011 (ref 1.005–1.030)
pH: 7 (ref 5.0–8.0)

## 2020-08-22 LAB — I-STAT CHEM 8, ED
BUN: 14 mg/dL (ref 8–23)
Calcium, Ion: 1.16 mmol/L (ref 1.15–1.40)
Chloride: 106 mmol/L (ref 98–111)
Creatinine, Ser: 1.3 mg/dL — ABNORMAL HIGH (ref 0.44–1.00)
Glucose, Bld: 99 mg/dL (ref 70–99)
HCT: 47 % — ABNORMAL HIGH (ref 36.0–46.0)
Hemoglobin: 16 g/dL — ABNORMAL HIGH (ref 12.0–15.0)
Potassium: 4.6 mmol/L (ref 3.5–5.1)
Sodium: 142 mmol/L (ref 135–145)
TCO2: 25 mmol/L (ref 22–32)

## 2020-08-22 LAB — COMPREHENSIVE METABOLIC PANEL
ALT: 12 U/L (ref 0–44)
AST: 15 U/L (ref 15–41)
Albumin: 4 g/dL (ref 3.5–5.0)
Alkaline Phosphatase: 51 U/L (ref 38–126)
Anion gap: 9 (ref 5–15)
BUN: 12 mg/dL (ref 8–23)
CO2: 26 mmol/L (ref 22–32)
Calcium: 10.1 mg/dL (ref 8.9–10.3)
Chloride: 106 mmol/L (ref 98–111)
Creatinine, Ser: 1.33 mg/dL — ABNORMAL HIGH (ref 0.44–1.00)
GFR, Estimated: 38 mL/min — ABNORMAL LOW (ref >60)
Glucose, Bld: 102 mg/dL — ABNORMAL HIGH (ref 70–99)
Potassium: 4.6 mmol/L (ref 3.5–5.1)
Sodium: 141 mmol/L (ref 135–145)
Total Bilirubin: 0.7 mg/dL (ref 0.3–1.2)
Total Protein: 7 g/dL (ref 6.5–8.1)

## 2020-08-22 LAB — APTT: aPTT: 29 seconds (ref 24–36)

## 2020-08-22 LAB — PROTIME-INR
INR: 1.2 (ref 0.8–1.2)
Prothrombin Time: 14.9 seconds (ref 11.4–15.2)

## 2020-08-22 MED ORDER — SODIUM CHLORIDE 0.9 % IV BOLUS
500.0000 mL | Freq: Once | INTRAVENOUS | Status: AC
  Administered 2020-08-22: 500 mL via INTRAVENOUS

## 2020-08-22 MED ORDER — LATANOPROST 0.005 % OP SOLN
1.0000 [drp] | Freq: Every day | OPHTHALMIC | Status: DC
  Administered 2020-08-22 – 2020-08-23 (×2): 1 [drp] via OPHTHALMIC
  Filled 2020-08-22: qty 2.5

## 2020-08-22 MED ORDER — FAMOTIDINE 20 MG PO TABS
20.0000 mg | ORAL_TABLET | Freq: Every morning | ORAL | Status: DC
  Administered 2020-08-23 – 2020-08-24 (×2): 20 mg via ORAL
  Filled 2020-08-22 (×2): qty 1

## 2020-08-22 MED ORDER — APIXABAN 2.5 MG PO TABS
2.5000 mg | ORAL_TABLET | Freq: Two times a day (BID) | ORAL | Status: DC
  Administered 2020-08-23 – 2020-08-24 (×3): 2.5 mg via ORAL
  Filled 2020-08-22 (×4): qty 1

## 2020-08-22 MED ORDER — CAPSAICIN 0.025 % EX CREA
1.0000 "application " | TOPICAL_CREAM | Freq: Three times a day (TID) | CUTANEOUS | Status: DC | PRN
  Filled 2020-08-22: qty 60

## 2020-08-22 MED ORDER — LORATADINE 10 MG PO TABS
10.0000 mg | ORAL_TABLET | Freq: Every morning | ORAL | Status: DC
  Administered 2020-08-23 – 2020-08-24 (×2): 10 mg via ORAL
  Filled 2020-08-22 (×2): qty 1

## 2020-08-22 MED ORDER — SODIUM CHLORIDE 0.9% FLUSH
3.0000 mL | Freq: Once | INTRAVENOUS | Status: AC
Start: 2020-08-22 — End: 2020-08-22
  Administered 2020-08-22: 3 mL via INTRAVENOUS

## 2020-08-22 MED ORDER — ACETAMINOPHEN 325 MG PO TABS: 650.0000 mg | ORAL_TABLET | Freq: Four times a day (QID) | ORAL | Status: DC | PRN

## 2020-08-22 MED ORDER — MIRABEGRON ER 25 MG PO TB24
50.0000 mg | ORAL_TABLET | Freq: Every morning | ORAL | Status: DC
  Administered 2020-08-23 – 2020-08-24 (×2): 50 mg via ORAL
  Filled 2020-08-22: qty 1
  Filled 2020-08-22 (×2): qty 2

## 2020-08-22 NOTE — ED Notes (Signed)
The pt reports that she has arthritis in her lt leg

## 2020-08-22 NOTE — ED Notes (Signed)
Urine Culture sent to Main Lab with UA

## 2020-08-22 NOTE — ED Notes (Signed)
The pt started coughing after the first few sips of water   She reports that she uses a straw at home and usually does not have a p[roblem with swallowing

## 2020-08-22 NOTE — Consult Note (Signed)
Neurology Consultation  Referring physician: Dr. Aletta Edouard, Benedetto Goad, PA-C Reason for consult: Slurred speech and garbled words  CC: Transient episode of slurred speech and garbled words  History is obtained from: Patient, chart, ED providers  HPI: Becky Leach is a 87 y.o. female who has a past medical history of CKD 3, hypertension, hypertension, unprovoked DVT and PE on Eliquis, who was in her usual state of health with last known well at 30 AM and was noted to have slurred speech at 2:40 PM.  She was with her son, who she lives with and who takes care of her and he noticed that her speech was slurred and she was having difficulty understanding.  Upon asking if she had any focal tingling, numbness or weakness-she reports transient right upper extremity weakness at that time as well which has resolved. Within 15 minutes of symptom onset, son called EMS and by the time EMS arrived another 15 minutes later, the symptoms started to resolve and nearly resolved hence a code stroke was not activated. She was brought into the ER for further evaluation. She does not report any similar episodes in the past.  She reports compliance to her medications. Reports no chest pain or shortness of breath.  Reports no fevers or chills.  Reports some cough and phlegm production.  Denies abdominal pain.  Denies nausea vomiting.  Denies visual problems or severe headache-reports a mild headache.  Has a history of DVT and PE for which she is on Eliquis which she takes regularly. Lives with family-son.  Son takes care of cooking and household chores.  He himself has medical conditions requiring wearing oxygen.  She stays home by herself as he goes for his appointments etc. but cannot be left alone for the whole day.  Requires assistance with bathing and other ADLs.   LKW: 9 AM tpa given?: no, symptoms resolved in the presentation out of the window due to last known normal at 9 AM Premorbid modified Rankin scale  (mRS): 3 to the best of my history taking from the patient    ROS: Performed and negative except as noted in HPI  Past Medical History:  Diagnosis Date  . AKI (acute kidney injury) ([place])    due to NSAID  . Arthritis   . Closed fracture of left proximal humerus 09/14/2017  . Constipation   . DVT (deep venous thrombosis) ([place])   . Herniated disc   . Hypertension   . Pulmonary embolism ([place])   . Seasonal allergies   . Spinal stenosis   . Stomach ulcer    due to NSAID  . Tachycardia   . UTI (lower urinary tract infection)   . Vitamin D deficiency 09/14/2017     Family History  Problem Relation Age of Onset  . Arthritis Mother   . Heart attack Mother      Social History:   reports that she has never smoked. She has never used smokeless tobacco. She reports that she does not drink alcohol and does not use drugs.  Medications No current facility-administered medications for this encounter.  Current Outpatient Medications:  .  acetaminophen (TYLENOL) 500 MG tablet, Take 500 mg by mouth at bedtime as needed for mild pain (or discomfort)., Disp: , Rfl:  .  Cholecalciferol (VITAMIN D-3) 25 MCG (1000 UT) CAPS, Take 1,000 Units by mouth daily., Disp: , Rfl:  .  ELIQUIS 2.5 MG TABS tablet, TAKE 1 TABLET BY MOUTH TWICE A DAY (Patient taking differently: Take  2.5 mg by mouth 2 (two) times daily.), Disp: 180 tablet, Rfl: 1 .  famotidine (PEPCID) 20 MG tablet, Take 20 mg by mouth in the morning., Disp: , Rfl:  .  latanoprost (XALATAN) 0.005 % ophthalmic solution, Place 1 drop into both eyes at bedtime., Disp: , Rfl:  .  loratadine (CLARITIN) 10 MG tablet, Take 1 tablet (10 mg total) by mouth daily. (Patient taking differently: Take 10 mg by mouth in the morning.), Disp: 90 tablet, Rfl: 3 .  MYRBETRIQ 50 MG TB24 tablet, Take 50 mg by mouth in the morning., Disp: , Rfl:  .  Trolamine Salicylate (ASPERCREME EX), Apply 1 application topically as needed (for pain)., Disp: , Rfl:  .  trospium  (SANCTURA) 20 MG tablet, Take 20 mg by mouth at bedtime., Disp: , Rfl:  .  acetaminophen (TYLENOL) 325 MG tablet, Take 2 tablets (650 mg total) by mouth every 6 (six) hours as needed for pain. (Patient not taking: Reported on 08/22/2020), Disp: 30 tablet, Rfl: 0 .  CVS D3 2000 units CAPS, TAKE 1 TABLET (2,000 UNITS TOTAL) BY MOUTH 2 (TWO) TIMES DAILY. (Patient not taking: Reported on 08/22/2020), Disp: 60 capsule, Rfl: 0 .  traMADol (ULTRAM) 50 MG tablet, TAKE 0.5 TABLET (25MG ) BY MOUTH EVERY 12 HOURS AS NEEDED SEVERE PAIN (Patient not taking: No sig reported), Disp: 30 tablet, Rfl: 0   Exam: Current vital signs: BP (!) 154/107   Pulse 91   Temp 99.1 F (37.3 C)   Resp 18   SpO2 97%  Vital signs in last 24 hours: Temp:  [99.1 F (37.3 C)] 99.1 F (37.3 C) (03/26 1517) Pulse Rate:  [90-111] 91 (03/26 1848) Resp:  [16-27] 18 (03/26 1848) BP: (137-176)/(107-109) 154/107 (03/26 1848) SpO2:  [92 %-98 %] 97 % (03/26 1848) GENERAL: Awake, alert in NAD HEENT: - Normocephalic and atraumatic, dry mm, no LN++, no Thyromegally LUNGS - Clear to auscultation bilaterally with no wheezes CV - S1S2 RRR, no m/r/g, equal pulses bilaterally. ABDOMEN - Soft, nontender, nondistended with normoactive BS Ext: warm, well perfused, intact peripheral pulses, no edema  NEURO:  Mental Status: Awake alert oriented to self, place.  Unable to tell me the correct month.  Told her age to be 75 when she is 63. Language: speech is nondysarthric.  Naming, repetition, fluency, and comprehension intact, although attention concentration is mildly reduced Cranial Nerves: PERRL. EOMI, visual fields full, no facial asymmetry, facial sensation intact, hearing intact, tongue/uvula/soft palate midline, normal sternocleidomastoid and trapezius muscle strength. No evidence of tongue atrophy or fibrillations Motor: Both upper extremities 5/5 without vertical drift.  Right upper extremity nearly full strength without any drift.  Left  lower extremity examination is limited by severe pain and arthritis and she was only able to give me a 4/5 at the hip flexors at best with some drift. Tone: is normal and bulk is normal Sensation- Intact to light touch bilaterally Coordination: FTN intact bilaterally-difficulty performing heel-knee-shin testing lower extremities due to arthritis. Gait- deferred  NIHSS 1a Level of Conscious.: 0 1b LOC Questions: 2 1c LOC Commands: 0 2 Best Gaze: 0 3 Visual: 0 4 Facial Palsy:0  5a Motor Arm - left: 0 5b Motor Arm - Right:  6a Motor Leg - Left: 1 6b Motor Leg - Right: 0 7 Limb Ataxia: 0 8 Sensory: 0 9 Best Language:0  10 Dysarthria: 0 11 Extinct. and Inatten.:0  TOTAL: 3  Labs I have reviewed labs in epic and the results pertinent to this  consultation are:  CBC    Component Value Date/Time   WBC 4.8 08/22/2020 1520   RBC 5.39 (H) 08/22/2020 1520   HGB 16.0 (H) 08/22/2020 1552   HGB 14.9 07/08/2020 1210   HCT 47.0 (H) 08/22/2020 1552   HCT 44.9 07/08/2020 1210   PLT 228 08/22/2020 1520   PLT 219 07/08/2020 1210   MCV 86.8 08/22/2020 1520   MCV 87 07/08/2020 1210   MCH 29.1 08/22/2020 1520   MCHC 33.5 08/22/2020 1520   RDW 14.7 08/22/2020 1520   RDW 14.8 07/08/2020 1210   LYMPHSABS 1.7 08/22/2020 1520   MONOABS 0.4 08/22/2020 1520   EOSABS 0.1 08/22/2020 1520   BASOSABS 0.0 08/22/2020 1520    CMP     Component Value Date/Time   NA 142 08/22/2020 1552   NA 143 07/08/2020 1210   K 4.6 08/22/2020 1552   CL 106 08/22/2020 1552   CO2 26 08/22/2020 1520   GLUCOSE 99 08/22/2020 1552   BUN 14 08/22/2020 1552   BUN 12 07/08/2020 1210   CREATININE 1.30 (H) 08/22/2020 1552   CREATININE 1.00 11/13/2013 1615   CALCIUM 10.1 08/22/2020 1520   PROT 7.0 08/22/2020 1520   ALBUMIN 4.0 08/22/2020 1520   AST 15 08/22/2020 1520   ALT 12 08/22/2020 1520   ALKPHOS 51 08/22/2020 1520   BILITOT 0.7 08/22/2020 1520   GFRNONAA 38 (L) 08/22/2020 1520   GFRAA 42 (L) 07/08/2020  1210      Imaging I have reviewed the images obtained:  CT-scan of the brain-chronic atrophy and small vessel disease.  No acute changes  MRI examination of the brain-no acute intracranial abnormality including no stroke or acute bleed.  Normal intracranial MRA.  Advanced chronic small vessel disease and generalized atrophy.  Assessment:  87 year old with above past medical history presenting for about a 30-minute episode of slurred speech, garbled words and difficulty understanding what was being said to her along with some right upper extremity weakness, which resolved within 30 minutes. Best localized to the left cerebral hemisphere. Has a history of DVT/PE for which she is on Eliquis. CT and MR imaging negative for any acute stroke or bleed. Likely a transient ischemic attack.  Needs further risk factor work-up   Impression: Evaluate for left hemispheric TIA  Recommendations: Admit to hospitalist Telemetry Frequent neurochecks Continue Eliquis Check lipid panel-goal LDL less than 70.  Statin for goal less than 70. Check A1c-goal less than 7. 2D echocardiogram PT OT Speech therapy N.p.o. until cleared by bedside swallow evaluation formal swallow evaluation. Permissive hypertension-treat only if systolic blood pressures greater than 220 on a as needed basis.  Stroke neurology will follow with you. Plan discussed with Benedetto Goad in the ER.    -- Amie Portland, MD Neurologist [facility] Pager: [phone]

## 2020-08-22 NOTE — H&P (Signed)
[facility] Admission History and Physical Service Pager: [phone]  Patient name: Becky Leach Date of birth: Jul 18, 1929 Age: 87 y.o. Gender: female  Primary Care Provider: Richarda Osmond, DO Consultants: neuro Code Status: full (for now. Patient wants to think about it and readdress at later time) Preferred Emergency Contact: both of her sons. (lives with bryant)  Chief Complaint: generalized weakness/slurred speech  Assessment and Plan: Becky Leach is a 87 y.o. female presenting with transient generalized weakness and slurred speech. PMH is significant for GERD, OA, h/o PE, HTN  ?TIA- neuro consulted. Head CT, brain MRI/MRA negative for acute changes. Symptoms have resolved at time of encounter, per patient. Exam negative for new focal findings but patient has chronic LLE weakness which she states is unchanged from baseline. She was given 520mL bolus NS in ED. Consider complex migraine- denies head pain currently (reported mild headache to neurologist, per note). - admit to [facility] obs with tele, attending Dr. Owens Shark - telemetry - lipid panel, TSH, A1c am - echo - PT/OT/SLP - consider ASA/plavix - tylenol PRN  H/o HTN- not on medications and was normotensive at recent office visits. Has reported elevated Bps from other appointments such as ophthalmology but again, normotensive on home readings. On presentation is hypertensive, particularly diastolic. 137-176/107-119. Possibly reflexive change from neurological assault. Will monitor closely and allow for permissive HTN per neurology recommendations - call physician for SBP >220, or DBP >120 for PRN treatment.  CKD- Cr 1.33 on admission. Appears to be at new baseline.  - avoid nephrotoxic agents  ?polycythemia- hgb 15.7 on presentation. Likely hemoconcentration. Baseline is mild normocytic anemia. differential was normal. Repeat CBC am after receiving bolus in  ED  FEN/GI: continue home pepcid Prophylaxis: eliquis  Disposition: obs, dc per neuro workup  History of Present Illness:  Becky Leach is a 87 y.o. female presenting with transient generalized weakness and slurred speech which was resolved by presentation to ED.  Patient felt in normal state of health this morning. Had acute onset of slurred speech with right hand weakness/flexion this afternoon. She denies any confusion and remembers the whole event but was not able to speak properly during this period. She had no other symptoms such as SOB, CP, nausea, diarrhea. Currently feels back to baseline other than an abnormal sensation in her left face but normal sensation when palpating.  At baseline, she ambulates at home with a walker. Lives with her son and requires assistance with most ADLs.  Review Of Systems: Per HPI with the following additions:   Review of Systems  Constitutional: Positive for activity change. Negative for appetite change and fever.  Respiratory: Negative for shortness of breath.   Cardiovascular: Negative for chest pain and leg swelling.  Gastrointestinal: Negative for abdominal pain, diarrhea and nausea.  Genitourinary: Negative for difficulty urinating.  Musculoskeletal: Positive for arthralgias.  Skin: Negative for rash.  Neurological: Positive for weakness. Negative for dizziness, syncope and headaches.  Psychiatric/Behavioral: Negative for confusion.     Patient Active Problem List   Diagnosis Date Noted  . GERD (gastroesophageal reflux disease) 07/10/2020  . CKD (chronic kidney disease) stage 3, GFR 30-59 ml/min (HCC) 07/10/2020  . History of hemorrhoids 12/25/2017  . Vitamin D deficiency 09/14/2017  . At risk for hemorrhage associated with anticoagulation therapy 09/12/2017  . Frequent falls 09/12/2017  . History of pulmonary embolus (PE) 05/11/2017  . Bilateral primary osteoarthritis of knee 12/26/2016  . Pain of left  lower extremity 08/28/2014  .  Spinal stenosis of lumbar region 11/14/2013  . Tachycardia 12/08/2011  . Benign essential HTN 12/06/2011    Past Medical History: Past Medical History:  Diagnosis Date  . AKI (acute kidney injury) ([place])    due to NSAID  . Arthritis   . Closed fracture of left proximal humerus 09/14/2017  . Constipation   . DVT (deep venous thrombosis) ([place])   . Herniated disc   . Hypertension   . Pulmonary embolism ([place])   . Seasonal allergies   . Spinal stenosis   . Stomach ulcer    due to NSAID  . Tachycardia   . UTI (lower urinary tract infection)   . Vitamin D deficiency 09/14/2017    Past Surgical History: Past Surgical History:  Procedure Laterality Date  . COLONOSCOPY  12/21/2011   Procedure: COLONOSCOPY;  Surgeon: Wonda Horner, MD;  Location: [facility];  Service: Endoscopy;  Laterality: N/A;  . DILATION AND CURETTAGE OF UTERUS    . ORIF HUMERUS FRACTURE Left 09/12/2017   Procedure: OPEN REDUCTION INTERNAL FIXATION (ORIF) PROXIMAL  HUMERUS FRACTURE;  Surgeon: Altamese [place], MD;  Location: [facility];  Service: Orthopedics;  Laterality: Left;  . TONSILLECTOMY AND ADENOIDECTOMY      Social History: Social History   Tobacco Use  . Smoking status: Never Smoker  . Smokeless tobacco: Never Used  Vaping Use  . Vaping Use: Never used  Substance Use Topics  . Alcohol use: No  . Drug use: No   Additional social history: lives with son.   Please also refer to relevant sections of EMR.  Family History: Family History  Problem Relation Age of Onset  . Arthritis Mother   . Heart attack Mother    Allergies and Medications: Allergies  Allergen Reactions  . Nsaids Other (See Comments)    "NSAIDS WILL CAUSE BLEEDING ULCERS"- do NOT give  . Penicillins Rash and Other (See Comments)    Cephalosporin tolerant given during surgery 08/2017   No current facility-administered medications on file prior to encounter.   Current Outpatient Medications on File Prior to Encounter  Medication  Sig Dispense Refill  . acetaminophen (TYLENOL) 500 MG tablet Take 500 mg by mouth at bedtime as needed for mild pain (or discomfort).    . Cholecalciferol (VITAMIN D-3) 25 MCG (1000 UT) CAPS Take 1,000 Units by mouth daily.    Marland Kitchen ELIQUIS 2.5 MG TABS tablet TAKE 1 TABLET BY MOUTH TWICE A DAY (Patient taking differently: Take 2.5 mg by mouth 2 (two) times daily.) 180 tablet 1  . famotidine (PEPCID) 20 MG tablet Take 20 mg by mouth in the morning.    . latanoprost (XALATAN) 0.005 % ophthalmic solution Place 1 drop into both eyes at bedtime.    Marland Kitchen loratadine (CLARITIN) 10 MG tablet Take 1 tablet (10 mg total) by mouth daily. (Patient taking differently: Take 10 mg by mouth in the morning.) 90 tablet 3  . MYRBETRIQ 50 MG TB24 tablet Take 50 mg by mouth in the morning.    Loura Pardon Salicylate (ASPERCREME EX) Apply 1 application topically as needed (for pain).    . trospium (SANCTURA) 20 MG tablet Take 20 mg by mouth at bedtime.    Marland Kitchen acetaminophen (TYLENOL) 325 MG tablet Take 2 tablets (650 mg total) by mouth every 6 (six) hours as needed for pain. (Patient not taking: Reported on 08/22/2020) 30 tablet 0  . CVS D3 2000 units CAPS TAKE 1 TABLET (2,000 UNITS TOTAL) BY  MOUTH 2 (TWO) TIMES DAILY. (Patient not taking: Reported on 08/22/2020) 60 capsule 0  . traMADol (ULTRAM) 50 MG tablet TAKE 0.5 TABLET (25MG ) BY MOUTH EVERY 12 HOURS AS NEEDED SEVERE PAIN (Patient not taking: No sig reported) 30 tablet 0   Objective: BP (!) 154/107   Pulse 91   Temp 99.1 F (37.3 C)   Resp 18   SpO2 97%  Physical Exam Vitals and nursing note reviewed.  Constitutional:      General: She is not in acute distress.    Appearance: She is normal weight. She is not ill-appearing or toxic-appearing.  HENT:     Head: Normocephalic.     Right Ear: External ear normal.     Left Ear: External ear normal.     Nose: Nose normal. No congestion.     Mouth/Throat:     Mouth: Mucous membranes are moist.  Eyes:     General: No  visual field deficit.    Extraocular Movements: Extraocular movements intact.     Conjunctiva/sclera: Conjunctivae normal.     Pupils: Pupils are equal, round, and reactive to light.  Cardiovascular:     Rate and Rhythm: Normal rate and regular rhythm.     Pulses: Normal pulses.  Pulmonary:     Effort: Pulmonary effort is normal. No respiratory distress.     Breath sounds: Normal breath sounds.  Abdominal:     Palpations: Abdomen is soft.  Musculoskeletal:     Cervical back: Tenderness present.  Skin:    General: Skin is warm.     Capillary Refill: Capillary refill takes less than 2 seconds.     Findings: No rash.  Neurological:     Mental Status: She is alert and oriented to person, place, and time. Mental status is at baseline.     GCS: GCS eye subscore is 4. GCS verbal subscore is 5. GCS motor subscore is 6.     Cranial Nerves: Cranial nerve deficit (assymetry with mouth and unable to hold air in cheeks- unsure if this is baseline. negative for dysarthria) present. No dysarthria.     Sensory: Sensation is intact.     Motor: Weakness (LLE 2/5 but patient states this is baseline due to arthritis) present.     Coordination: Finger-Nose-Finger Test normal.  Psychiatric:        Mood and Affect: Mood normal.     Labs and Imaging: CBC BMET  Recent Labs  Lab 08/22/20 1520 08/22/20 1552  WBC 4.8  --   HGB 15.7* 16.0*  HCT 46.8* 47.0*  PLT 228  --    Recent Labs  Lab 08/22/20 1520 08/22/20 1552  NA 141 142  K 4.6 4.6  CL 106 106  CO2 26  --   BUN 12 14  CREATININE 1.33* 1.30*  GLUCOSE 102* 99  CALCIUM 10.1  --     PT/INR= normal  EKG: similar to previous. Sinus tachycardia.... borderline 1st degree block?  Imaging: Head CT, brain MRI/MRA negative for acute ischemia or hemorrhage. Age-related changes.  Echo: pending  Becky Osmond, DO 08/22/2020, 7:43 PM PGY-3, [facility] Intern pager: [phone], text pages welcome

## 2020-08-22 NOTE — ED Notes (Signed)
Report given to rn on 3  w 

## 2020-08-22 NOTE — ED Triage Notes (Signed)
IV 20g left AC

## 2020-08-22 NOTE — ED Notes (Signed)
Neuro in to see

## 2020-08-22 NOTE — ED Provider Notes (Signed)
[facility] EMERGENCY DEPARTMENT Provider Note   CSN: [id number] Arrival date & time: 08/22/20  1456     History Chief Complaint  Patient presents with  . Weakness  . Aphasia    Becky Leach is a 87 y.o. female.  Becky Leach is a 87 y.o. female with a history of hypertension, tachycardia, spinal stenosis, AKI, PE, DVT, who presents to the ED via EMS for evaluation of episode of slurred speech and right arm weakness.  Patient reports around lunchtime she suddenly felt like she could not get her words out and when she could speak it was slurred and difficult to understand.  She also reports that with the speech difficulties she had difficulty moving her right arm.  Her son had come to check on her to see what she wanted for lunch when the symptoms began, noted that she could not speak normally and that was not resolving and so called EMS.  While in transit all symptoms resolved.  Son estimates that symptoms lasted about 30 minutes in total.  She has never had this happen before.  She reports that she woke up this morning feeling normally and her son reports that he had check on her around 18 AM and at this time she had normal speech and movement.  Was at her baseline this morning.  Patient denies any numbness weakness or tingling elsewhere.  Denies any visual changes or headache.  No dizziness.  No chest pain, shortness of breath, abdominal pain, no urinary symptoms.  Patient is on anticoagulation and is compliant with this.  No other aggravating or alleviating factors.        Past Medical History:  Diagnosis Date  . AKI (acute kidney injury) ([place])    due to NSAID  . Arthritis   . Closed fracture of left proximal humerus 09/14/2017  . Constipation   . DVT (deep venous thrombosis) ([place])   . Herniated disc   . Hypertension   . Pulmonary embolism ([place])   . Seasonal allergies   . Spinal stenosis   . Stomach ulcer    due to NSAID  . Tachycardia   . UTI (lower  urinary tract infection)   . Vitamin D deficiency 09/14/2017    Patient Active Problem List   Diagnosis Date Noted  . GERD (gastroesophageal reflux disease) 07/10/2020  . CKD (chronic kidney disease) stage 3, GFR 30-59 ml/min (HCC) 07/10/2020  . History of hemorrhoids 12/25/2017  . Vitamin D deficiency 09/14/2017  . At risk for hemorrhage associated with anticoagulation therapy 09/12/2017  . Frequent falls 09/12/2017  . History of pulmonary embolus (PE) 05/11/2017  . Bilateral primary osteoarthritis of knee 12/26/2016  . Pain of left lower extremity 08/28/2014  . Spinal stenosis of lumbar region 11/14/2013  . Tachycardia 12/08/2011  . Benign essential HTN 12/06/2011    Past Surgical History:  Procedure Laterality Date  . COLONOSCOPY  12/21/2011   Procedure: COLONOSCOPY;  Surgeon: Wonda Horner, MD;  [facility];  Service: Endoscopy;  Laterality: N/A;  . DILATION AND CURETTAGE OF UTERUS    . ORIF HUMERUS FRACTURE Left 09/12/2017   Procedure: OPEN REDUCTION INTERNAL FIXATION (ORIF) PROXIMAL  HUMERUS FRACTURE;  Surgeon: Altamese Keya P[place], MD;  Location: [facility];  Service: Orthopedics;  Laterality: Left;  . TONSILLECTOMY AND ADENOIDECTOMY       OB History    Gravida  4   Para  4   Term      Preterm  AB      Living        SAB      IAB      Ectopic      Multiple      Live Births              Family History  Problem Relation Age of Onset  . Arthritis Mother   . Heart attack Mother     Social History   Tobacco Use  . Smoking status: Never Smoker  . Smokeless tobacco: Never Used  Vaping Use  . Vaping Use: Never used  Substance Use Topics  . Alcohol use: No  . Drug use: No    Home Medications Prior to Admission medications   Medication Sig Start Date End Date Taking? Authorizing Provider  acetaminophen (TYLENOL) 325 MG tablet Take 2 tablets (650 mg total) by mouth every 6 (six) hours as needed for pain. 02/27/12   Dhungel, Nishant, MD  [employer]S  D3 2000 units CAPS TAKE 1 TABLET (2,000 UNITS TOTAL) BY MOUTH 2 (TWO) TIMES DAILY. 10/06/17   [name], MD  ELIQUIS 2.5 MG TABS tablet TAKE 1 TABLET BY MOUTH TWICE A DAY 03/16/20   Mannam, Praveen, MD  fluticasone (FLONASE) 50 MCG/ACT nasal spray Place into both nostrils daily.    [provider]  latanoprost (XALATAN) 0.005 % ophthalmic solution 1 drop at bedtime. 07/03/20   [provider]  loratadine (CLARITIN) 10 MG tablet Take 1 tablet (10 mg total) by mouth daily. 01/03/18   Marshell Garfinkel, MD  MYRBETRIQ 50 MG TB24 tablet  11/22/18   [provider]  pantoprazole (PROTONIX) 40 MG tablet Take by mouth. 05/28/19   [provider]  traMADol (ULTRAM) 50 MG tablet TAKE 0.5 TABLET (25MG ) BY MOUTH EVERY 12 HOURS AS NEEDED SEVERE PAIN 05/10/19   Doristine Mango L, DO    Allergies    Penicillins  Review of Systems   Review of Systems  Constitutional: Negative for chills and fever.  HENT: Negative.   Eyes: Negative for visual disturbance.  Respiratory: Negative for cough and shortness of breath.   Cardiovascular: Negative for chest pain.  Gastrointestinal: Negative for abdominal pain, nausea and vomiting.  Musculoskeletal: Negative for arthralgias and myalgias.  Skin: Negative for color change and rash.  Neurological: Positive for speech difficulty and weakness. Negative for dizziness, seizures, syncope, facial asymmetry, light-headedness, numbness and headaches.    Physical Exam Updated Vital Signs BP (!) 137/109 (BP Location: Right Arm)   Pulse (!) 111   Temp 99.1 F (37.3 C)   Resp 16   SpO2 92%   Physical Exam Vitals and nursing note reviewed.  Constitutional:      General: She is not in acute distress.    Appearance: Normal appearance. She is well-developed. She is not diaphoretic.     Comments: Elderly female, alert, in no acute distress  HENT:     Head: Normocephalic and atraumatic.     Mouth/Throat:     Mouth: Mucous membranes are  moist.     Pharynx: Oropharynx is clear.  Eyes:     General:        Right eye: No discharge.        Left eye: No discharge.     Extraocular Movements: Extraocular movements intact.     Pupils: Pupils are equal, round, and reactive to light.  Cardiovascular:     Rate and Rhythm: Normal rate and regular rhythm.     Pulses: Normal pulses.  Heart sounds: Normal heart sounds. No murmur heard. No friction rub. No gallop.   Pulmonary:     Effort: Pulmonary effort is normal. No respiratory distress.     Breath sounds: Normal breath sounds. No wheezing or rales.     Comments: Respirations equal and unlabored, patient able to speak in full sentences, lungs clear to auscultation bilaterally  Abdominal:     General: Bowel sounds are normal. There is no distension.     Palpations: Abdomen is soft. There is no mass.     Tenderness: There is no abdominal tenderness. There is no guarding.     Comments: Abdomen soft, nondistended, nontender to palpation in all quadrants without guarding or peritoneal signs  Musculoskeletal:        General: No deformity.     Cervical back: Normal range of motion and neck supple. No rigidity.     Right lower leg: No edema.     Left lower leg: No edema.  Skin:    General: Skin is warm and dry.     Capillary Refill: Capillary refill takes less than 2 seconds.  Neurological:     Mental Status: She is alert.     Coordination: Coordination normal.     Comments: Speech is clear, able to follow commands CN III-XII intact Normal strength in upper and lower extremities bilaterally including dorsiflexion and plantar flexion, strong and equal grip strength Sensation normal to light and sharp touch Moves extremities without ataxia, coordination intact Normal finger to nose and rapid alternating movements No pronator drift   Psychiatric:        Mood and Affect: Mood normal.        Behavior: Behavior normal.     ED Results / Procedures / Treatments   Labs (all  labs ordered are listed, but only abnormal results are displayed) Labs Reviewed  CBC - Abnormal; Notable for the following components:      Result Value   RBC 5.39 (*)    Hemoglobin 15.7 (*)    HCT 46.8 (*)    All other components within normal limits  COMPREHENSIVE METABOLIC PANEL - Abnormal; Notable for the following components:   Glucose, Bld 102 (*)    Creatinine, Ser 1.33 (*)    GFR, Estimated 38 (*)    All other components within normal limits  I-STAT CHEM 8, ED - Abnormal; Notable for the following components:   Creatinine, Ser 1.30 (*)    Hemoglobin 16.0 (*)    HCT 47.0 (*)    All other components within normal limits  PROTIME-INR  APTT  DIFFERENTIAL  URINALYSIS, ROUTINE W REFLEX MICROSCOPIC  CBG MONITORING, ED    EKG EKG Interpretation  Date/Time:  Saturday August 22 2020 15:19:11 EDT Ventricular Rate:  110 PR Interval:  176 QRS Duration: 68 QT Interval:  304 QTC Calculation: 411 R Axis:   -2 Text Interpretation: Sinus tachycardia Otherwise normal ECG No significant change since prior 9/21 Confirmed by Aletta Edouard [id number]) on 08/22/2020 3:37:51 PM   Radiology CT HEAD WO CONTRAST  Result Date: 08/22/2020 CLINICAL DATA:  Neuro deficit, acute, stroke suspected EXAM: CT HEAD WITHOUT CONTRAST TECHNIQUE: Contiguous axial images were obtained from the base of the skull through the vertex without intravenous contrast. COMPARISON:  Head CT 09/11/2017 FINDINGS: Brain: No hemorrhage. Stable degree of generalized atrophy. Advanced periventricular and deep white matter hypodensity consistent with chronic small vessel ischemia. Remote lacunar infarct in the left basal ganglia. No evidence is acute ischemia. No hydrocephalus, midline  shift, or mass effect. No subdural or extra-axial collection. Vascular: Atherosclerosis of skullbase vasculature without hyperdense vessel or abnormal calcification. Skull: No fracture or focal lesion. Sinuses/Orbits: Scattered mucosal thickening of  ethmoid air cells. No sinus fracture or fluid level. Mastoid air cells are clear. Bilateral cataract resection. Other: None. IMPRESSION: 1. No acute intracranial abnormality, no CT evidence of acute ischemia. 2. Stable atrophy and chronic small vessel ischemia. Remote lacunar infarct in the left basal ganglia. Electronically Signed   By: Keith Rake M.D.   On: 08/22/2020 16:23    Procedures Procedures   Medications Ordered in ED Medications  sodium chloride flush (NS) 0.9 % injection 3 mL (has no administration in time range)  sodium chloride 0.9 % bolus 500 mL (has no administration in time range)    ED Course  I have reviewed the triage vital signs and the nursing notes.  Pertinent labs & imaging results that were available during my care of the patient were reviewed by me and considered in my medical decision making (see chart for details).  Clinical Course as of 08/22/20 1702  Sat Mar 26, 516  [id number] 87 year old female brought in by EMS for general weakness and some slurred speech.  Symptoms seem to resolve now.  Any labs CT imaging urinalysis.  Disposition per results of testing. [MB]    Clinical Course User Index [MB] Hayden Rasmussen, MD   MDM Rules/Calculators/A&P                          87 year old female presents with slurred speech and right arm weakness, the symptoms lasted about 30 minutes per patient's son and EMS and then resolved on their own.  On arrival currently patient with no focal neurologic deficits.  No known prior stroke history.  No other focal medical complaints.  Presentation concerning for potential TIA versus small stroke will continue with frequent neuro checks.  CT head and stroke labs ordered, if initial work-up is reassuring will likely need MRI and hospital admission.  I discussed this plan with patient's son who is in agreement.  Additional history obtained by speaking with patient's sons and review of the electronic medical record.  I have  independently ordered, reviewed and interpreted all labs and imaging: CBC: No leukocytosis, hemoglobin minimally elevated CMP: Glucose 102, creatinine 1.33, minimally increased from baseline, IV fluids ordered, no other electrolyte derangements and normal liver function. Coags: WNL UA: No signs of infection  EKG with sinus tachycardia with no significant changes compared to prior  CT of the head with no acute intracranial abnormality, stable atrophy and chronic microvascular ischemic changes noted, remote lacunar infarct in the left basal ganglia noted.  Case discussed with Dr. Cheral Marker with neurology who agrees, likely TIA, recommends medicine admission for TIA work-up including MRI and MRA of the brain.  Will consult medicine for admission.  Case discussed with family medicine teaching service who will see and admit the patient.  Final Clinical Impression(s) / ED Diagnoses Final diagnoses:  TIA (transient ischemic attack)    Rx / DC Orders ED Discharge Orders    None       Janet Berlin 08/26/20 0045    Hayden Rasmussen, MD 08/26/20 1040

## 2020-08-22 NOTE — Progress Notes (Signed)
Pt admitted from ED with stroke like symptoms, alert and oriented, denies any pain at this time, tele monitor put and verified on pt, call light within pt's reach, safety concern addressed accordingly, was however reassured and will continue to monitor, v/s stable. /Obasogie-Asidi, Elyan Vanwieren Efe

## 2020-08-22 NOTE — ED Notes (Signed)
The pt did not pass her swallow screen

## 2020-08-22 NOTE — ED Triage Notes (Signed)
EMS stated, she was weak this morning. Her speech slurring at 1440, last normal was this morning at 900am. All symptoms have resolve when we arrived. Pt on Elaquis. Only complain  Left Jaw pain.

## 2020-08-23 ENCOUNTER — Encounter (HOSPITAL_COMMUNITY): Payer: Self-pay | Admitting: Student in an Organized Health Care Education/Training Program

## 2020-08-23 ENCOUNTER — Observation Stay (HOSPITAL_BASED_OUTPATIENT_CLINIC_OR_DEPARTMENT_OTHER): Payer: Medicare Other

## 2020-08-23 DIAGNOSIS — G459 Transient cerebral ischemic attack, unspecified: Secondary | ICD-10-CM

## 2020-08-23 LAB — CBC
HCT: 43.9 % (ref 36.0–46.0)
Hemoglobin: 15.1 g/dL — ABNORMAL HIGH (ref 12.0–15.0)
MCH: 29.6 pg (ref 26.0–34.0)
MCHC: 34.4 g/dL (ref 30.0–36.0)
MCV: 86.1 fL (ref 80.0–100.0)
Platelets: 204 10*3/uL (ref 150–400)
RBC: 5.1 MIL/uL (ref 3.87–5.11)
RDW: 14.6 % (ref 11.5–15.5)
WBC: 5.8 10*3/uL (ref 4.0–10.5)
nRBC: 0 % (ref 0.0–0.2)

## 2020-08-23 LAB — ECHOCARDIOGRAM COMPLETE
Area-P 1/2: 4.19 cm2
Calc EF: 51.8 %
Height: 65 in
P 1/2 time: 551 msec
S' Lateral: 2.4 cm
Single Plane A2C EF: 51.5 %
Single Plane A4C EF: 51.3 %

## 2020-08-23 LAB — LIPID PANEL
Cholesterol: 203 mg/dL — ABNORMAL HIGH (ref 0–200)
HDL: 77 mg/dL (ref >40)
LDL Cholesterol: 119 mg/dL — ABNORMAL HIGH (ref 0–99)
Total CHOL/HDL Ratio: 2.6 RATIO
Triglycerides: 33 mg/dL (ref <150)
VLDL: 7 mg/dL (ref 0–40)

## 2020-08-23 LAB — BASIC METABOLIC PANEL
Anion gap: 9 (ref 5–15)
BUN: 8 mg/dL (ref 8–23)
CO2: 23 mmol/L (ref 22–32)
Calcium: 9.9 mg/dL (ref 8.9–10.3)
Chloride: 109 mmol/L (ref 98–111)
Creatinine, Ser: 1.23 mg/dL — ABNORMAL HIGH (ref 0.44–1.00)
GFR, Estimated: 42 mL/min — ABNORMAL LOW (ref >60)
Glucose, Bld: 103 mg/dL — ABNORMAL HIGH (ref 70–99)
Potassium: 3.9 mmol/L (ref 3.5–5.1)
Sodium: 141 mmol/L (ref 135–145)

## 2020-08-23 LAB — HEMOGLOBIN A1C
Hgb A1c MFr Bld: 6 % — ABNORMAL HIGH (ref 4.8–5.6)
Mean Plasma Glucose: 125.5 mg/dL

## 2020-08-23 LAB — SARS CORONAVIRUS 2 (TAT 6-24 HRS): SARS Coronavirus 2: NEGATIVE

## 2020-08-23 LAB — TSH: TSH: 1.83 u[IU]/mL (ref 0.350–4.500)

## 2020-08-23 MED ORDER — STROKE: EARLY STAGES OF RECOVERY BOOK
Status: AC
  Filled 2020-08-23: qty 1

## 2020-08-23 MED ORDER — ATORVASTATIN CALCIUM 40 MG PO TABS
40.0000 mg | ORAL_TABLET | Freq: Every day | ORAL | Status: DC
  Administered 2020-08-23 – 2020-08-24 (×2): 40 mg via ORAL
  Filled 2020-08-23 (×2): qty 1

## 2020-08-23 MED ORDER — STROKE: EARLY STAGES OF RECOVERY BOOK: Freq: Once | Status: AC

## 2020-08-23 NOTE — Hospital Course (Addendum)
  TIA 87 yo woman admitted with transient RUE weakness, TIA vs Stroke. Admitted for stroke work up. MRI negative, c/w TIA. Echo returned with EF 65-70%, normal LV function, no regional wall motion abnormalities, mild LVH, G1DD. Carotid duplex with only minimal wall thickening or plaques of both right and left carotids. Neurology recommended ASA 81 mg x 2 weeks + eliquis, risk reduction with high dose statin, normotension. PT recommended home with home health PT. No follow up recommended from OT.  Polycythemia vera Patient had elevation of Hgb to 15.7 on admission, repeat was 15.1 on day of discharge. Recommend follow up as an outpatient.  All other problems chronic and stable.  Follow Up Recommendations: Neurology recommends 2 weeks of ASA + eliquis, then to continue on eliquis alone therafter.  High intensity statin started with atorvastatin 40 mg. Recommend guideline appropriate monitoring of LFTs. Goal LDL <70. Polycythemia: recommend observation as outpatient. F/u CBC.

## 2020-08-23 NOTE — Progress Notes (Addendum)
Family Medicine Teaching Service Daily Progress Note Intern Pager: [phone]  Patient name: Becky Leach record number: [id number] Date of birth: 02-08-1930 Age: 87 y.o. Gender: female  Primary Care Provider: Richarda Osmond, DO Consultants: neuro Code Status: full  Pt Overview and Major Events to Date:  3/26 admitted  Assessment and Plan: TAELYR JANTZ is a 87 y.o. female presenting with transient generalized weakness and slurred speech. PMH is significant for GERD, OA, h/o PE, h/o HTN  ?TIA-symptoms resolved. Stable. Neuro following. Risk labs completed. hgb A1c 6.0, elevated LDL and total cholesterol 119, 203. - telemetry - f/u echo - PT/OT/SLP - start atorvastatin 40 - consider ASA/plavix - tylenol PRN  H/o HTN- hypertensive 151/104 this am.  - call physician for SBP >220, or DBP >120 for PRN treatment.  - start to normalize pressure around 48 hrs  CKD- Cr 1.33 on admission and baseline.  - avoid nephrotoxic agents - BMP am  ?polycythemia- hgb 15.7 on presentation. Repeat 15.1 this am  FEN/GI: continue home pepcid Prophylaxis: eliquis Status is: Observation  The patient remains OBS appropriate and will d/c before 2 midnights.  Dispo: The patient is from: Home              Anticipated d/c is to: Home              Patient currently is not medically stable to d/c.   Difficult to place patient No  Subjective:  Patient states that she is doing well this morning. She doesn't have any complaints.  Left voicemail with son, bryant, to update him with her condition and her room number with patient's permission.  Objective: Temp:  [97.8 F (36.6 C)-99.1 F (37.3 C)] 97.8 F (36.6 C) (03/27 0408) Pulse Rate:  [85-111] 89 (03/27 0416) Resp:  [16-27] 18 (03/27 0408) BP: (137-176)/(96-119) 151/104 (03/27 0416) SpO2:  [92 %-100 %] 100 % (03/27 0408)  Physical Exam Vitals and nursing note reviewed.  Constitutional:      General: She is not in acute  distress. Eyes:     Conjunctiva/sclera: Conjunctivae normal.  Cardiovascular:     Rate and Rhythm: Normal rate.  Abdominal:     Palpations: Abdomen is soft.  Neurological:     Mental Status: She is alert.     Cranial Nerves: Facial asymmetry (again, mild asymmetry with left mouth droop but unsure of baseline. no dysarthria) present.     Sensory: Sensation is intact.  Psychiatric:        Mood and Affect: Mood normal.    Laboratory: Recent Labs  Lab 08/22/20 1520 08/22/20 1552 08/23/20 0157  WBC 4.8  --  5.8  HGB 15.7* 16.0* 15.1*  HCT 46.8* 47.0* 43.9  PLT 228  --  204   Recent Labs  Lab 08/22/20 1520 08/22/20 1552  NA 141 142  K 4.6 4.6  CL 106 106  CO2 26  --   BUN 12 14  CREATININE 1.33* 1.30*  CALCIUM 10.1  --   PROT 7.0  --   BILITOT 0.7  --   ALKPHOS 51  --   ALT 12  --   AST 15  --   GLUCOSE 102* 99   Lipid Panel     Component Value Date/Time   CHOL 203 (H) 08/23/2020 0157   TRIG 33 08/23/2020 0157   HDL 77 08/23/2020 0157   CHOLHDL 2.6 08/23/2020 0157   VLDL 7 08/23/2020 0157   LDLCALC 119 (H) 08/23/2020 0157  Imaging/Diagnostic Tests: None new. Echo pending  Richarda Osmond, DO 08/23/2020, 6:01 AM PGY-3, [facility] Intern pager: [phone], text pages welcome

## 2020-08-23 NOTE — Progress Notes (Addendum)
Neurology Progress Note  Patient ID: Becky Leach is a 87 y.o. with PMHx of  has a past medical history of AKI (acute kidney injury) ([place]), Arthritis, Closed fracture of left proximal humerus (09/14/2017), Constipation, DVT (deep venous thrombosis) ([place]), Herniated disc, Hypertension, Pulmonary embolism ([place]), Seasonal allergies, Spinal stenosis, Stomach ulcer, Tachycardia, UTI (lower urinary tract infection), and Vitamin D deficiency (09/14/2017).  Initially consulted for: transient right upper extremity weakness.    Major interval events:  MRI brain shows no acute intracranial abnormality. .   Subjective:  pt reports feeling much improved.  Exam: Vitals:   08/23/20 0416 08/23/20 0933  BP: (!) 151/104 (!) 146/104  Pulse: 89 91  Resp:  18  Temp:  98 F (36.7 C)  SpO2:  100%   Gen: In bed, comfortable  Resp: non-labored breathing, no grossly audible wheezing Cardiac: Perfusing extremities well  Abd: soft, nt   Mental Status: Awake alert oriented to self, place.  Unable to tell me the correct month, but otherwise intact. She appears thin and not well nourished.  Language: speech is nondysarthric.  Naming, repetition, fluency, and comprehension intact, although attention concentration is mildly reduced Cranial Nerves: PERRL. EOMI, visual fields full, no facial asymmetry, facial sensation intact, hearing intact, tongue/uvula/soft palate midline, normal sternocleidomastoid and trapezius muscle strength. No evidence of tongue atrophy or fibrillations Motor: Left upper extremities 5/5 without vertical drift. Right upper extremity nearly full strength without any drift.  Left lower extremity examination is limited by severe pain and arthritis and she was only able to give me a 4/5 at the hip flexors at best with some drift. Tone: is normal and bulk is normal Sensation- Intact to light touch bilaterally Coordination: FTN intact bilaterally-difficulty performing heel-knee-shin testing lower  extremities due to arthritis. Gait- deferred   Pertinent Labs:  Results for HELLON, VACCARELLA (MRN [id number]) as of 08/23/2020 10:39  Ref. Range 08/23/2020 01:57  Total CHOL/HDL Ratio Latest Units: RATIO 2.6  Cholesterol Latest Ref Range: 0 - 200 mg/dL 203 (H)  HDL Cholesterol Latest Ref Range: >40 mg/dL 77  LDL (calc) Latest Ref Range: 0 - 99 mg/dL 119 (H)  Triglycerides Latest Ref Range: <150 mg/dL 33  VLDL Latest Ref Range: 0 - 40 mg/dL 7  WBC Latest Ref Range: 4.0 - 10.5 K/uL 5.8  RBC Latest Ref Range: 3.87 - 5.11 MIL/uL 5.10  Hemoglobin Latest Ref Range: 12.0 - 15.0 g/dL 15.1 (H)  HCT Latest Ref Range: 36.0 - 46.0 % 43.9  MCV Latest Ref Range: 80.0 - 100.0 fL 86.1  MCH Latest Ref Range: 26.0 - 34.0 pg 29.6  MCHC Latest Ref Range: 30.0 - 36.0 g/dL 34.4  RDW Latest Ref Range: 11.5 - 15.5 % 14.6  Platelets Latest Ref Range: 150 - 400 K/uL 204  nRBC Latest Ref Range: 0.0 - 0.2 % 0.0  Hemoglobin A1C Latest Ref Range: 4.8 - 5.6 % 6.0 (H)   Echocardiogram: done and results pending COVID: negative MRI/MRA head and neck:  1. No acute intracranial abnormality. 2. Normal intracranial MRA. 3. Advanced chronic small vessel disease and generalized volume Loss.  Impression: 87 year old female admitted with transient right upper extremity weakness. MRI negative. Exam and imaging consistent with TIA  Recommendations: -  Start aspirin 81mg  daily for two weeks in addition to eliquis, then discontinue aspirin. This required a long conversation with patient as she reported she "already on tylenol."  PT/OT/ST Placement.  Social work Facilities manager malnutrition.  Tight glycemic control Maintain normotension Tight  lipid management with high dose statin We will sign off at this time but remain available to assist.     Discussed with Dr. Merlene Laughter. Dr. Merlene Laughter -- The patient was seen and examined by me; notes, chart and tests reviewed and discussed with the practice provider, other providers,  patient, and family.  MRI reviewed in person and shows age-related findings.

## 2020-08-23 NOTE — Progress Notes (Signed)
  Echocardiogram 2D Echocardiogram has been performed.  Michiel Cowboy 08/23/2020, 8:13 AM

## 2020-08-23 NOTE — Evaluation (Signed)
Clinical/Bedside Swallow Evaluation Patient Details  Name: Becky Leach MRN: [id number] Date of Birth: June 23, 1929  Today's Date: 08/23/2020 Time: SLP Start Time (ACUTE ONLY): 1000 SLP Stop Time (ACUTE ONLY): 1017 SLP Time Calculation (min) (ACUTE ONLY): 17 min  Past Medical History:  Past Medical History:  Diagnosis Date  . AKI (acute kidney injury) ([place])    due to NSAID  . Arthritis   . Closed fracture of left proximal humerus 09/14/2017  . Constipation   . DVT (deep venous thrombosis) ([place])   . Herniated disc   . Hypertension   . Pulmonary embolism ([place])   . Seasonal allergies   . Spinal stenosis   . Stomach ulcer    due to NSAID  . Tachycardia   . UTI (lower urinary tract infection)   . Vitamin D deficiency 09/14/2017   Past Surgical History:  Past Surgical History:  Procedure Laterality Date  . COLONOSCOPY  12/21/2011   Procedure: COLONOSCOPY;  Surgeon: Wonda Horner, MD;  Location: [facility];  Service: Endoscopy;  Laterality: N/A;  . DILATION AND CURETTAGE OF UTERUS    . ORIF HUMERUS FRACTURE Left 09/12/2017   Procedure: OPEN REDUCTION INTERNAL FIXATION (ORIF) PROXIMAL  HUMERUS FRACTURE;  Surgeon: Altamese Hendricks, MD;  Location: [facility];  Service: Orthopedics;  Laterality: Left;  . TONSILLECTOMY AND ADENOIDECTOMY     HPI:  Becky Leach is a 87 y.o. female presenting with transient generalized weakness and slurred speech. PMH is significant for GERD, OA, h/o PE, h/o HTN.  MRI 3/26 with no acute findings.  No current chest imaging.   Assessment / Plan / Recommendation Clinical Impression  Pt presents with clinical indicators of pharyngeal dysphagia. Initially on SLP arrival, pt requested emisis bag to spit in before beginning OME. She used tissues to help clear secretions.  Besides this, pt exhibited no other difficulty with secretion management. There was immediate throat clear with thin liquid by both cup and straw.  Pt stated that this was d/t allergies, but no other  coughing was noted with other consistencies during PO trials, or in absence of PO trials during cognitive-linguistic evaluation.  Cough was eliminated with nectar thick liquids.  Imaging was negative for acute findings.  This may represent a chronic issue.  Pt ehibited good oral clearance of solids. Pt is edentulous and reports that she does eat softer foods at home. She would prefer soft foods while here. There were no clinicaly s/s of aspiration with puree or regular solid textures. Pt took pill independently whole in puree without difficulty. Recommend further assessment of pharyngeal swallow function with MBSS tentatively scheduled for next date.    Recommend initating mechanical soft diet with nectar thick liquids pending results of MBSS.  SLP Visit Diagnosis: Dysphagia, unspecified (R13.10)    Aspiration Risk  Mild aspiration risk    Diet Recommendation Dysphagia 3 (Mech soft);Nectar-thick liquid   Liquid Administration via: Cup;No straw Medication Administration: Whole meds with puree (as tolerated) Supervision: Patient able to self feed Compensations: Slow rate;Small sips/bites Postural Changes: Seated upright at 90 degrees    Other  Recommendations Oral Care Recommendations: Oral care BID   Follow up Recommendations  (TBD)      Frequency and Duration  (TBD)          Prognosis Prognosis for Safe Diet Advancement: Good      Swallow Study   General Date of Onset: 08/22/20 HPI: Becky Leach is a 87 y.o. female presenting with transient generalized weakness and  slurred speech. PMH is significant for GERD, OA, h/o PE, h/o HTN.  MRI 3/26 with no acute findings.  No current chest imaging. Type of Study: Bedside Swallow Evaluation Previous Swallow Assessment: none Diet Prior to this Study: NPO Temperature Spikes Noted: No Respiratory Status: Room air History of Recent Intubation: No Behavior/Cognition: Alert;Cooperative;Pleasant mood Oral Cavity Assessment: Within Functional  Limits Oral Care Completed by SLP: No Oral Cavity - Dentition: Edentulous Vision: Functional for self-feeding Self-Feeding Abilities: Able to feed self Patient Positioning: Upright in chair Baseline Vocal Quality: Normal Volitional Cough: Strong Volitional Swallow: Able to elicit    Oral/Motor/Sensory Function Overall Oral Motor/Sensory Function: Mild impairment Facial ROM: Within Functional Limits Facial Symmetry: Abnormal symmetry left Lingual ROM: Within Functional Limits Lingual Symmetry: Abnormal symmetry left Lingual Strength: Reduced Velum: Within Functional Limits Mandible: Within Functional Limits   Ice Chips Ice chips: Not tested   Thin Liquid Thin Liquid: Impaired Presentation: Cup;Straw Pharyngeal  Phase Impairments: Throat Clearing - Immediate    Nectar Thick Nectar Thick Liquid: Within functional limits Presentation: Cup;Straw   Honey Thick Honey Thick Liquid: Not tested   Puree Puree: Within functional limits Presentation: Spoon   Solid     Solid: Within functional limits Presentation: Becky Leach , [place], [place] Office: [phone]; Pager (3/27): [phone] 08/23/2020,11:22 AM

## 2020-08-23 NOTE — Evaluation (Addendum)
Occupational Therapy Evaluation Patient Details Name: Becky Leach MRN: [id number] DOB: 1929-09-07 Today's Date: 08/23/2020    History of Present Illness Becky Leach is a 87 y.o. female presenting with transient generalized weakness and slurred speech. Dx: TIA. PMH is significant for GERD, OA, h/o PE, h/o HTN   Clinical Impression   Pt PTA: pt living with son and pt performs own ADL and pt's son assists with iADL. Pt  Currently, limited by decreased strength and increased need for assist for self care. Pt sitting at sink for light grooming and able to stand, but unable to let go of RW. Pt minguardA for bed mobility and minA for transfers. Pt minA overall for ADL. Pt would benefit from continued OT skilled services for vision, ADL and mobility. OT following acutely. Addendum to add HHOT as pt's son is able to assist with minA for transfers and ADL.     Follow Up Recommendations  Home health OT;Supervision/Assistance - 24 hour ((son confirmed with PT that he can provide level of care))    Equipment Recommendations  None recommended by OT    Recommendations for Other Services       Precautions / Restrictions Precautions Precautions: Fall Restrictions Weight Bearing Restrictions: No      Mobility Bed Mobility Overal bed mobility: Needs Assistance Bed Mobility: Supine to Sit Rolling: Min guard Sidelying to sit: Supervision       General bed mobility comments: Able to come to edge of bed without physical assist, using rail    Transfers Overall transfer level: Needs assistance Equipment used: Rolling walker (2 wheeled) Transfers: Sit to/from Stand Sit to Stand: Min assist Stand pivot transfers: Min assist       General transfer comment: MinA to power up and initially steady    Balance Overall balance assessment: Needs assistance Sitting-balance support: Feet supported Sitting balance-Leahy Scale: Fair     Standing balance support: Bilateral upper extremity  supported Standing balance-Leahy Scale: Poor Standing balance comment: unable to let go of RW                           ADL either performed or assessed with clinical judgement   ADL Overall ADL's : Needs assistance/impaired Eating/Feeding: Set up;Sitting   Grooming: Min guard;Standing   Upper Body Bathing: Set up;Sitting   Lower Body Bathing: Minimal assistance;Sitting/lateral leans;Sit to/from stand;Cueing for safety   Upper Body Dressing : Set up;Sitting   Lower Body Dressing: Minimal assistance;Cueing for safety;Sitting/lateral leans;Sit to/from stand   Toilet Transfer: Minimal assistance;Ambulation;BSC;RW   Toileting- Clothing Manipulation and Hygiene: Minimal assistance;Cueing for safety;Sitting/lateral lean;Sit to/from stand       Functional mobility during ADLs: Minimal assistance;Cueing for safety;Rolling walker General ADL Comments: pt limited by decreased strength, decreased activity tolerance and decreased ability to care for self. pt following all commands and symptoms have resolved.     Vision Baseline Vision/History: Wears glasses Wears Glasses: At all times Patient Visual Report: Other (comment) (pt does not have access to glasses here) Vision Assessment?: Yes Eye Alignment: Within Functional Limits Ocular Range of Motion: Within Functional Limits Alignment/Gaze Preference: Within Defined Limits Tracking/Visual Pursuits: Impaired - to be further tested in functional context Additional Comments: Some difficulty following commands for peripheral vision testing, but during mobility, pt was able to avoid obstacles.     Perception     Praxis      Pertinent Vitals/Pain Pain Assessment: Faces Faces Pain Scale: No hurt  Hand Dominance Right   Extremity/Trunk Assessment Upper Extremity Assessment Upper Extremity Assessment: Defer to OT evaluation   Lower Extremity Assessment Lower Extremity Assessment: RLE deficits/detail;LLE  deficits/detail RLE Deficits / Details: Strength 5/5 LLE Deficits / Details: Grossly 4/5 (history of hip/knee arthritis)   Cervical / Trunk Assessment Cervical / Trunk Assessment: Kyphotic   Communication Communication Communication: HOH   Cognition Arousal/Alertness: Awake/alert Behavior During Therapy: WFL for tasks assessed/performed Overall Cognitive Status: Impaired/Different from baseline Area of Impairment: Orientation                 Orientation Level: Disoriented to;Time             General Comments: Pt not oriented to year; stating initially 1922, then when cued, able to correctly state 2022. Pt reports "I'm tired."   General Comments  HR 100-106 BPM with exertion    Exercises     Shoulder Instructions      Home Living Family/patient expects to be discharged to:: Private residence Living Arrangements: Children Available Help at Discharge: Family;Available 24 hours/day Type of Home: House Home Access: Stairs to enter [employer] of Steps: 6 Entrance Stairs-Rails: Can reach both Home Layout: One level     Bathroom Shower/Tub: Teacher, early years/pre: Standard     Home Equipment: Wheelchair - [employer] - 2 wheels;Cane - single point;Bedside commode;Tub bench      Lives With: Son (bryant)    Prior Functioning/Environment Level of Independence: Needs assistance  Gait / Transfers Assistance Needed: RW for household distances ADL's / Homemaking Assistance Needed: Pt performs ADLs; Pt's son assists with iADL            OT Problem List: Decreased strength;Decreased activity tolerance;Impaired balance (sitting and/or standing);Decreased cognition;Pain;Cardiopulmonary status limiting activity      OT Treatment/Interventions: Self-care/ADL training;Therapeutic exercise;Energy conservation;DME and/or AE instruction;Therapeutic activities;Patient/family education;Balance training    OT Goals(Current goals can be found in  the care plan section) Acute Rehab OT Goals Patient Stated Goal: to go home OT Goal Formulation: With patient Time For Goal Achievement: 09/06/20 Potential to Achieve Goals: Good ADL Goals Pt Will Perform Grooming: with supervision;standing Pt Will Transfer to Toilet: with supervision;ambulating;bedside commode Pt Will Perform Toileting - Clothing Manipulation and hygiene: with supervision;sit to/from stand Additional ADL Goal #1: Pt will participate visual perceptual scanning in trail making task x3 mins with minimal cues to complete task.  OT Frequency: Min 2X/week   Barriers to D/C:            Co-evaluation              AM-PAC OT "6 Clicks" Daily Activity     Outcome Measure Help from another person eating meals?: None Help from another person taking care of personal grooming?: A Little Help from another person toileting, which includes using toliet, bedpan, or urinal?: A Little Help from another person bathing (including washing, rinsing, drying)?: A Little Help from another person to put on and taking off regular upper body clothing?: A Little Help from another person to put on and taking off regular lower body clothing?: A Little 6 Click Score: 19   End of Session Equipment Utilized During Treatment: Gait belt;Rolling walker Nurse Communication: Mobility status  Activity Tolerance: Patient tolerated treatment well Patient left: in chair;with call bell/phone within reach;with chair alarm set  OT Visit Diagnosis: Unsteadiness on feet (R26.81);Muscle weakness (generalized) (M62.81)                Time: 5170-0174  OT Time Calculation (min): 28 min Charges:  OT General Charges $OT Visit: 1 Visit OT Evaluation $OT Eval Moderate Complexity: 1 Mod  Jefferey Pica, OTR/L Acute Rehabilitation Services Pager: [phone] Office: [phone]   Qusai Kem C 08/23/2020, 10:59 AM

## 2020-08-23 NOTE — Evaluation (Signed)
Speech Language Pathology Evaluation Patient Details Name: LATASHIA KOCH MRN: [id number] DOB: 07-30-1929 Today's Date: 08/23/2020 Time: 5093-2671 SLP Time Calculation (min) (ACUTE ONLY): 31 min  Problem List:  Patient Active Problem List   Diagnosis Date Noted  . AMS (altered mental status) 08/22/2020  . GERD (gastroesophageal reflux disease) 07/10/2020  . CKD (chronic kidney disease) stage 3, GFR 30-59 ml/min (HCC) 07/10/2020  . History of hemorrhoids 12/25/2017  . Vitamin D deficiency 09/14/2017  . At risk for hemorrhage associated with anticoagulation therapy 09/12/2017  . Frequent falls 09/12/2017  . History of pulmonary embolus (PE) 05/11/2017  . Bilateral primary osteoarthritis of knee 12/26/2016  . Pain of left lower extremity 08/28/2014  . Spinal stenosis of lumbar region 11/14/2013  . Tachycardia 12/08/2011  . Benign essential HTN 12/06/2011   Past Medical History:  Past Medical History:  Diagnosis Date  . AKI (acute kidney injury) ([place])    due to NSAID  . Arthritis   . Closed fracture of left proximal humerus 09/14/2017  . Constipation   . DVT (deep venous thrombosis) ([place])   . Herniated disc   . Hypertension   . Pulmonary embolism ([place])   . Seasonal allergies   . Spinal stenosis   . Stomach ulcer    due to NSAID  . Tachycardia   . UTI (lower urinary tract infection)   . Vitamin D deficiency 09/14/2017   Past Surgical History:  Past Surgical History:  Procedure Laterality Date  . COLONOSCOPY  12/21/2011   Procedure: COLONOSCOPY;  Surgeon: Wonda Horner, MD;  Location: [facility];  Service: Endoscopy;  Laterality: N/A;  . DILATION AND CURETTAGE OF UTERUS    . ORIF HUMERUS FRACTURE Left 09/12/2017   Procedure: OPEN REDUCTION INTERNAL FIXATION (ORIF) PROXIMAL  HUMERUS FRACTURE;  Surgeon: Altamese Irving, MD;  Location: [facility];  Service: Orthopedics;  Laterality: Left;  . TONSILLECTOMY AND ADENOIDECTOMY     HPI:  MARNELL MCDANIEL is a 87 y.o. female presenting with  transient generalized weakness and slurred speech. PMH is significant for GERD, OA, h/o PE, h/o HTN.  MRI 3/26 with no acute findings.  No current chest imaging.   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  Pt presents with moderately imparied cognition, but it is unclear if any of the deficits noted today represent a change from her baseline, as pt's imaging was negative for acute findings and she states that she has returned to baseline.  Pt was assessed using the COGNISTAT (see below for additional information).  Pt exhibited mild impairments in naming, but as there was no word finding difficulty or paraphasia noted in conversation, this is suspected to be because the target items were unfamiliar to her rather than representative of a true language defcits.  Pt's expressive language appears WFL in conversation.  Pt exhibited severe memory impairments with word recall task.  There were moderate deficits in calculations. Pt states that her son manages her finances, prepares her meals, and that she manages her medications once they are in a pill box.  There were mild deficits in similarities (reasoning task), but pt performed relatively well on judgment task. Some mild consonant imprecision was noted in conversation.  This did not affect intelligibility.  SLP asked for infrequent repetions.  And pt states that the slurred speech she had experienced as now resolved. Pt may  benefit from brief ST to address the above noted deficits, particularly some training with memory strategies while in the acute care setting.  Pt's family may be able to provide additional information relative to baseline level of function.   COGNISTAT - Of note: this assessment is normed to age 70.  The below values represent absolute performance, not compared to similarly aged cohort.   Impairment designations are therefore not included in results.  Orientation:  10/12 Attention: 8/8 Comprehension: 5/6 Repetition: 12/12 Naming: 5/8 Construction: not assessed Memory: 2/12 Calculations: 1/4 Similarities: 4/8 Judgment: 5/6      SLP Assessment  SLP Recommendation/Assessment: Patient needs continued Speech Lanaguage Pathology Services SLP Visit Diagnosis: Cognitive communication deficit (R41.841)    Follow Up Recommendations    TBD   Frequency and Duration min 2x/week  2 weeks      SLP Evaluation Cognition  Overall Cognitive Status: Impaired/Different from baseline Orientation Level: Oriented X4;Oriented to place Attention: Focused Focused Attention: Appears intact Memory: Impaired Memory Impairment: Storage deficit;Retrieval deficit       Comprehension  Auditory Comprehension Overall Auditory Comprehension: Appears within functional limits for tasks assessed Commands: Within Functional Limits Conversation: Simple Interfering Components: Hearing Visual Recognition/Discrimination Discrimination: Not tested Reading Comprehension Reading Status: Not tested    Expression Expression Primary Mode of Expression: Verbal Verbal Expression Overall Verbal Expression: Appears within functional limits for tasks assessed Initiation: No impairment Repetition: No impairment Naming: Impairment Confrontation: Impaired Pragmatics: No impairment Written Expression Dominant Hand: Right Written Expression: Not tested   Oral / Motor  Oral Motor/Sensory Function Overall Oral Motor/Sensory Function: Mild impairment Facial ROM: Within Functional Limits Facial Symmetry: Abnormal symmetry left Lingual ROM: Within Functional Limits Lingual Symmetry: Abnormal symmetry left Lingual Strength: Reduced Velum: Within Functional Limits Mandible: Within Functional Limits Motor Speech Respiration: Within functional limits Phonation: Normal Resonance: Within functional  limits Articulation: Impaired Level of Impairment: Word Intelligibility: Intelligible Motor Planning: Witnin functional limits   GO                    Celedonio Savage , MA, [place] Office: [phone]; Pager (3/27): [phone] 08/23/2020, 11:07 AM

## 2020-08-23 NOTE — TOC Initial Note (Signed)
Transition of Care [facility]) - Initial/Assessment Note    Patient Details  Name: Becky Leach MRN: [id number] Date of Birth: 1929/06/18  [facility] CM/SW Contact:    Pollie Friar, RN Phone Number: 08/23/2020, 3:49 PM  Clinical Narrative:                 CM consulted on patient appearing thin/ malnourished. CM met with the patient and her son at the bedside. Per patient she has always been thin. Son agrees. She states she eats when she is hungry. Son not concerned about her current weight.  HH arranged through [facility] per patients choice.  No current DME needs.  Awaiting ST [facility].  TOC following.  Expected Discharge Plan: [place] Barriers to Discharge: Continued Medical Work up   Patient Goals and CMS Choice   CMS Medicare.gov Compare Post Acute Care list provided to:: Patient Choice offered to / list presented to : [place]  Expected Discharge Plan and Services Expected Discharge Plan: [place]   Discharge Planning Services: CM Consult Post Acute Care Choice: [place] arrangements for the past 2 months: [place]: PT,OT,Speech Therapy HH Agency: [place] ([place]) Date HH Agency Contacted: 08/23/20   Representative spoke with at [place]: Corene Cornea  Prior Living Arrangements/Services Living arrangements for the past 2 months: [place] Lives with:: Adult Children Patient language and need for interpreter reviewed:: Yes Do you feel safe going back to the place where you live?: Yes      Need for Family Participation in Patient Care: Yes (Comment) Care giver support system in place?: Yes (comment)   Criminal Activity/Legal Involvement Pertinent to Current Situation/Hospitalization: No - Comment as needed  Activities of Daily Living      Permission Sought/Granted                  Emotional Assessment Appearance::  Appears stated age Attitude/Demeanor/Rapport: Engaged Affect (typically observed): Accepting Orientation: : Oriented to Self,Oriented to Place,Oriented to  Time,Oriented to Situation   Psych Involvement: No (comment)  Admission diagnosis:  TIA (transient ischemic attack) [G45.9] AMS (altered mental status) [R41.82] Patient Active Problem List   Diagnosis Date Noted  . AMS (altered mental status) 08/22/2020  . GERD (gastroesophageal reflux disease) 07/10/2020  . CKD (chronic kidney disease) stage 3, GFR 30-59 ml/min (HCC) 07/10/2020  . History of hemorrhoids 12/25/2017  . Vitamin D deficiency 09/14/2017  . At risk for hemorrhage associated with anticoagulation therapy 09/12/2017  . Frequent falls 09/12/2017  . History of pulmonary embolus (PE) 05/11/2017  . Bilateral primary osteoarthritis of knee 12/26/2016  . Pain of left lower extremity 08/28/2014  . Spinal stenosis of lumbar region 11/14/2013  . Tachycardia 12/08/2011  . Benign essential HTN 12/06/2011   PCP:  Richarda Osmond, DO Pharmacy:   [id number]: [phone]: [phone]    Social Determinants of Health (SDOH) Interventions    Readmission Risk Interventions No flowsheet data found.

## 2020-08-23 NOTE — Evaluation (Signed)
Physical Therapy Evaluation Patient Details Name: Becky Leach MRN: [id number] DOB: Oct 11, 1929 Today's Date: 08/23/2020   History of Present Illness  Becky Leach is a 87 y.o. female presenting with transient generalized weakness and slurred speech. Dx: TIA. PMH is significant for GERD, OA, h/o PE, h/o HTN  Clinical Impression  Prior to admission, pt lives with her son, is a limited household ambulator using a walker, and is independent with ADL's. On PT evaluation, pt denies residual symptoms and no focal deficits noted; appears to be mildly weaker than her baseline. Performing transfers with min assist, ambulating x 25 feet with a walker at a min guard assist level. HR 100-106 bpm. Discussed with pt son, [name], via phone, and he stated he feels comfortable providing pt with current level of assist. See below for recommendations.     Follow Up Recommendations Home health PT;Supervision/Assistance - 24 hour (prefers [place])    Equipment Recommendations  None recommended by PT    Recommendations for Other Services       Precautions / Restrictions Precautions Precautions: Fall Restrictions Weight Bearing Restrictions: No      Mobility  Bed Mobility Overal bed mobility: Needs Assistance Bed Mobility: Supine to Sit Rolling: Min guard Sidelying to sit: Supervision       General bed mobility comments: Able to come to edge of bed without physical assist, using rail    Transfers Overall transfer level: Needs assistance Equipment used: Rolling walker (2 wheeled) Transfers: Sit to/from Stand Sit to Stand: Min assist Stand pivot transfers: Min assist       General transfer comment: MinA to power up and initially steady  Ambulation/Gait Ambulation/Gait assistance: Min guard Gait Distance (Feet): 25 Feet Assistive device: Rolling walker (2 wheeled) Gait Pattern/deviations: Step-through pattern;Decreased stride length;Trunk flexed Gait velocity: decreased Gait  velocity interpretation: <1.8 ft/sec, indicate of risk for recurrent falls General Gait Details: Slow pace, increased trunk flexion (pt son reports this is baseline), cues provided for walker proximity and obstacle negotiation  Stairs            Wheelchair Mobility    Modified Rankin (Stroke Patients Only) Modified Rankin (Stroke Patients Only) Pre-Morbid Rankin Score: Moderate disability Modified Rankin: Moderately severe disability     Balance Overall balance assessment: Needs assistance Sitting-balance support: Feet supported Sitting balance-Leahy Scale: Fair     Standing balance support: Bilateral upper extremity supported Standing balance-Leahy Scale: Poor Standing balance comment: unable to let go of RW                             Pertinent Vitals/Pain Pain Assessment: Faces Faces Pain Scale: No hurt    Home Living Family/patient expects to be discharged to:: Private residence Living Arrangements: Children Available Help at Discharge: Family;Available 24 hours/day Type of Home: House Home Access: Stairs to enter Entrance Stairs-Rails: Can reach both Entrance Stairs-Number of Steps: 6 Home Layout: One level Home Equipment: Wheelchair - [employer] - 2 wheels;Cane - single point;Bedside commode;Tub bench      Prior Function Level of Independence: Needs assistance   Gait / Transfers Assistance Needed: RW for household distances  ADL's / Homemaking Assistance Needed: Pt performs ADLs; Pt's son assists with iADL        Hand Dominance   Dominant Hand: Right    Extremity/Trunk Assessment   Upper Extremity Assessment Upper Extremity Assessment: Defer to OT evaluation    Lower Extremity Assessment Lower Extremity Assessment:  RLE deficits/detail;LLE deficits/detail RLE Deficits / Details: Strength 5/5 LLE Deficits / Details: Grossly 4/5 (history of hip/knee arthritis)    Cervical / Trunk Assessment Cervical / Trunk Assessment: Kyphotic   Communication   Communication: HOH  Cognition Arousal/Alertness: Awake/alert Behavior During Therapy: WFL for tasks assessed/performed Overall Cognitive Status: Impaired/Different from baseline Area of Impairment: Orientation                 Orientation Level: Disoriented to;Time             General Comments: Pt not oriented to year; stating initially 1922, then when cued, able to correctly state 2022. Pt reports "I'm tired."      General Comments General comments (skin integrity, edema, etc.): HR 100-106 BPM with exertion    Exercises     Assessment/Plan    PT Assessment Patient needs continued PT services  PT Problem List Decreased strength;Decreased activity tolerance;Decreased balance;Decreased mobility       PT Treatment Interventions DME instruction;Gait training;Functional mobility training;Therapeutic activities;Therapeutic exercise;Balance training;Stair training;Patient/family education    PT Goals (Current goals can be found in the Care Plan section)  Acute Rehab PT Goals Patient Stated Goal: to go home PT Goal Formulation: With patient Time For Goal Achievement: 09/06/20 Potential to Achieve Goals: Good    Frequency Min 3X/week   Barriers to discharge        Co-evaluation               AM-PAC PT "6 Clicks" Mobility  Outcome Measure Help needed turning from your back to your side while in a flat bed without using bedrails?: None Help needed moving from lying on your back to sitting on the side of a flat bed without using bedrails?: A Little Help needed moving to and from a bed to a chair (including a wheelchair)?: A Little Help needed standing up from a chair using your arms (e.g., wheelchair or bedside chair)?: A Little Help needed to walk in hospital room?: A Little Help needed climbing 3-5 steps with a railing? : A Little 6 Click Score: 19    End of Session Equipment Utilized During Treatment: Gait belt Activity Tolerance:  Patient tolerated treatment well Patient left: in chair;with call bell/phone within reach;with chair alarm set Nurse Communication: Mobility status PT Visit Diagnosis: Unsteadiness on feet (R26.81);Muscle weakness (generalized) (M62.81);Difficulty in walking, not elsewhere classified (R26.2)    Time: 6237-6283 PT Time Calculation (min) (ACUTE ONLY): 28 min   Charges:   PT Evaluation $PT Eval Moderate Complexity: 1 Mod          Wyona Almas, PT, DPT Acute Rehabilitation Services Pager [phone] Office [phone]   Deno Etienne 08/23/2020, 10:23 AM

## 2020-08-23 NOTE — Care Management Obs Status (Signed)
[place] NOTIFICATION   Patient Details  Name: ARABELA BASALDUA MRN: [id number] Date of Birth: January 17, 1930   Medicare Observation Status Notification Given:  Yes    Pollie Friar, RN 08/23/2020, 3:43 PM

## 2020-08-23 NOTE — Discharge Summary (Shared)
[facility] Discharge Summary  Patient name: Becky Leach record number: [id number] Date of birth: 1929-10-10 Age: 87 y.o. Gender: female Date of Admission: 08/22/2020  Date of Discharge: home Admitting Physician: Richarda Osmond, DO  Primary Care Provider: Richarda Osmond, DO Consultants: neuro  Indication for Hospitalization: TIA  Discharge Diagnoses/Problem List:  TIA HLD HTN CKDIII  Disposition: home with home PT  Discharge Condition: resolved  Discharge Exam:  ***  Brief Hospital Course:  No notes on file Patient had resolution of presenting symptoms by time of initial evaluation other than potentially left-sided mouth droop but potentially baseline. Head CT and MRI were negative for acute ischemia or bleed. Neuro workup included lipid panel showing elevated LDL >70 so started on atorvastatin 40mg   Issues for Follow Up:  1. Repeat lipid panel in about 4-6 months  Significant Procedures: echo  Significant Labs and Imaging:  Recent Labs  Lab 08/22/20 1520 08/22/20 1552 08/23/20 0157  WBC 4.8  --  5.8  HGB 15.7* 16.0* 15.1*  HCT 46.8* 47.0* 43.9  PLT 228  --  204   Recent Labs  Lab 08/22/20 1520 08/22/20 1552  NA 141 142  K 4.6 4.6  CL 106 106  CO2 26  --   GLUCOSE 102* 99  BUN 12 14  CREATININE 1.33* 1.30*  CALCIUM 10.1  --   ALKPHOS 51  --   AST 15  --   ALT 12  --   ALBUMIN 4.0  --    Lipid Panel     Component Value Date/Time   CHOL 203 (H) 08/23/2020 0157   TRIG 33 08/23/2020 0157   HDL 77 08/23/2020 0157   CHOLHDL 2.6 08/23/2020 0157   VLDL 7 08/23/2020 0157   LDLCALC 119 (H) 08/23/2020 0157     Results/Tests Pending at Time of Discharge: ***  Discharge Medications:  Allergies as of 08/23/2020      Reactions   Nsaids Other (See Comments)   "NSAIDS WILL CAUSE BLEEDING ULCERS"- do NOT give   Penicillins Rash, Other (See Comments)   Cephalosporin tolerant given during surgery 08/2017     Med Rec must be completed prior to using this [facility]***       Discharge Instructions: Please refer to Patient Instructions section of EMR for full details.  Patient was counseled important signs and symptoms that should prompt return to medical care, changes in medications, dietary instructions, activity restrictions, and follow up appointments.   Follow-Up Appointments:   Richarda Osmond, DO 08/23/2020, 7:14 AM PGY-***, [place]

## 2020-08-23 NOTE — Discharge Summary (Signed)
[facility] Discharge Summary  Patient name: Becky Leach record number: [id number] Date of birth: 01/19/1930 Age: 87 y.o. Gender: female Date of Admission: 08/22/2020  Date of Discharge: 08/23/2020 Admitting Physician: Richarda Osmond, DO  Primary Care Provider: Richarda Osmond, DO Consultants: Neurology  Indication for Hospitalization: stroke vs TIA work up  Discharge Diagnoses/Problem List:  TIA TN HLD CKD Polycythemia GERD, h/o PUD on NSAIDs H/o PE OA  Disposition: home  Discharge Condition: stable  Discharge Exam:  Temp:  [97.8 F (36.6 C)-99.1 F (37.3 C)] 97.8 F (36.6 C) (03/27 0408) Pulse Rate:  [85-111] 89 (03/27 0416) Resp:  [16-27] 18 (03/27 0408) BP: (137-176)/(96-119) 151/104 (03/27 0416) SpO2:  [92 %-100 %] 100 % (03/27 0408)  Physical Exam Vitals and nursing note reviewed.  Constitutional:      General: She is not in acute distress. Eyes:     Conjunctiva/sclera: Conjunctivae normal.  Cardiovascular:     Rate and Rhythm: Normal rate.  Abdominal:     Palpations: Abdomen is soft.  Neurological:     Mental Status: She is alert.     Cranial Nerves: Facial asymmetry  present.     Sensory: Sensation is intact.  Psychiatric:        Mood and Affect: Mood normal.  Brief Hospital Course:   TIA 87 yo woman admitted with transient RUE weakness, TIA vs Stroke. Admitted for stroke work up. MRI negative, c/w TIA. Echo returned with EF 65-70%, normal LV function, no regional wall motion abnormalities, mild LVH, G1DD. Carotid duplex with only minimal wall thickening or plaques of both right and left carotids. Neurology recommended ASA 81 mg x 2 weeks + eliquis, risk reduction with high dose statin, normotension. PT recommended home with home health PT. No follow up recommended from OT.  Polycythemia vera Patient had elevation of Hgb to 15.7 on admission, repeat was 15.1 on day of discharge. Recommend follow up as an  outpatient.  All other problems chronic and stable.  Follow Up Recommendations: 1. Neurology recommends 2 weeks of ASA + eliquis, then to continue on eliquis alone therafter.  2. High intensity statin started with atorvastatin 40 mg. Recommend guideline appropriate monitoring of LFTs. Goal LDL <70. 3. Polycythemia: recommend observation as outpatient. F/u CBC.   Significant Procedures: none  Significant Labs and Imaging:  Recent Labs  Lab 08/22/20 1520 08/22/20 1552 08/23/20 0157  WBC 4.8  --  5.8  HGB 15.7* 16.0* 15.1*  HCT 46.8* 47.0* 43.9  PLT 228  --  204   Recent Labs  Lab 08/22/20 1520 08/22/20 1552 08/23/20 1122  NA 141 142 141  K 4.6 4.6 3.9  CL 106 106 109  CO2 26  --  23  GLUCOSE 102* 99 103*  BUN 12 14 8   CREATININE 1.33* 1.30* 1.23*  CALCIUM 10.1  --  9.9  ALKPHOS 51  --   --   AST 15  --   --   ALT 12  --   --   ALBUMIN 4.0  --   --     Results/Tests Pending at Time of Discharge: Carotid duplex  Discharge Medications:  Allergies as of 08/24/2020      Reactions   Nsaids Other (See Comments)   "NSAIDS WILL CAUSE BLEEDING ULCERS"- do NOT give   Penicillins Rash, Other (See Comments)   Cephalosporin tolerant given during surgery 08/2017      Medication List    STOP taking these medications  acetaminophen 325 MG tablet Commonly known as: Tylenol   acetaminophen 500 MG tablet Commonly known as: TYLENOL   traMADol 50 MG tablet Commonly known as: ULTRAM     TAKE these medications   apixaban 2.5 MG Tabs tablet Commonly known as: Eliquis Take 1 tablet (2.5 mg total) by mouth 2 (two) times daily.   ASPERCREME EX Apply 1 application topically as needed (for pain).   aspirin EC 81 MG tablet Take 1 tablet (81 mg total) by mouth daily for 14 days. Swallow whole.   atorvastatin 40 MG tablet Commonly known as: LIPITOR Take 1 tablet (40 mg total) by mouth daily. Start taking on: August 25, 2020   Vitamin D-3 25 MCG (1000 UT) Caps Take 1,000  Units by mouth daily.   [facility] D3 50 MCG (2000 UT) Caps Generic drug: Cholecalciferol TAKE 1 TABLET (2,000 UNITS TOTAL) BY MOUTH 2 (TWO) TIMES DAILY.   famotidine 20 MG tablet Commonly known as: PEPCID Take 20 mg by mouth in the morning.   latanoprost 0.005 % ophthalmic solution Commonly known as: XALATAN Place 1 drop into both eyes at bedtime.   loratadine 10 MG tablet Commonly known as: CLARITIN Take 1 tablet (10 mg total) by mouth daily. What changed: when to take this   Myrbetriq 50 MG Tb24 tablet Generic drug: mirabegron ER Take 50 mg by mouth in the morning.   trospium 20 MG tablet Commonly known as: SANCTURA Take 20 mg by mouth at bedtime.       Discharge Instructions: Please refer to Patient Instructions section of EMR for full details.  Patient was counseled important signs and symptoms that should prompt return to medical care, changes in medications, dietary instructions, activity restrictions, and follow up appointments.   Follow-Up Appointments:  Follow-up Information    Ouida Sills, Chelsey L, DO. Go on 09/08/2020.   Specialty: Family Medicine Why: Please arrive at 1:35 PM for a 1:50 PM appt Contact information: [address] [place] [address] [place] Follow up.   Why: THe home health agency will contact you for the first home visit. Contact information: [place], [place], DO 08/24/2020, 5:18 PM PGY-2, [place]

## 2020-08-24 ENCOUNTER — Observation Stay (HOSPITAL_COMMUNITY): Payer: Medicare Other

## 2020-08-24 ENCOUNTER — Observation Stay (HOSPITAL_BASED_OUTPATIENT_CLINIC_OR_DEPARTMENT_OTHER): Payer: Medicare Other

## 2020-08-24 DIAGNOSIS — G459 Transient cerebral ischemic attack, unspecified: Secondary | ICD-10-CM | POA: Diagnosis not present

## 2020-08-24 MED ORDER — ASPIRIN EC 81 MG PO TBEC: 81.0000 mg | DELAYED_RELEASE_TABLET | Freq: Every day | ORAL | 0 refills | Status: AC

## 2020-08-24 MED ORDER — ATORVASTATIN CALCIUM 40 MG PO TABS: 40.0000 mg | ORAL_TABLET | Freq: Every day | ORAL | 0 refills | Status: DC

## 2020-08-24 MED ORDER — APIXABAN 2.5 MG PO TABS: 2.5000 mg | ORAL_TABLET | Freq: Two times a day (BID) | ORAL | 0 refills | Status: DC

## 2020-08-24 NOTE — Progress Notes (Signed)
Modified Barium Swallow Progress Note  Patient Details  Name: Becky Leach MRN: [id number] Date of Birth: 1929/09/16  Today's Date: 08/24/2020  Modified Barium Swallow completed.  Full report located under Chart Review in the Imaging Section.  Brief recommendations include the following:  Clinical Impression  Pt presents with a mild oropharyngeal swallow marked by premature loss of thin liquids into the pharynx, with spillage of TRACE amount of liquid (less than ~5% of bolus) into the laryngeal vestibule just prior to airway closure (which is complete). When material reaches the vocal folds, this triggers a throat-clear, which is effective in clearing the penetrant on some occasions. At other times, trace liquid travels inferiorly and outlines the anterior wall of the trachea. A chin tuck was utilized and may have shown marginal benefit in laryngeal vestibule closure.  For now, recommend continuing dys 3/nectar liquids. SLP will follow for education/working with chin tuck and improving oral control.  Given the inconsistent episodes of aspiration, the very trace quantities observed to be aspirated, the protective benefit of throat-clearing/cough on some occasions, and no hx of pna, it is reasonable to work toward a goal of liberating liquids to thin sooner rather than later.  SLP will follow.   Swallow Evaluation Recommendations       SLP Diet Recommendations: Dysphagia 3 (Mech soft) solids;Nectar thick liquid   Liquid Administration via: Cup;Straw   Medication Administration: Whole meds with puree   Supervision: Patient able to self feed           Oral Care Recommendations: Oral care BID       [name] L. Tivis Ringer, [place] Office number [phone] Pager [phone]  [name] Quam [name] 08/24/2020,12:35 PM

## 2020-08-24 NOTE — Progress Notes (Incomplete)
Family Medicine Teaching Service Daily Progress Note Intern Pager: [phone]  Patient name: Becky Leach record number: [id number] Date of birth: 06/27/1929 Age: 87 y.o. Gender: female  Primary Care Provider: Richarda Osmond, DO Consultants: *** Code Status: ***  Pt Overview and Major Events to Date:  ***  Assessment and Plan:  ***  FEN/GI: *** PPx: ***  Disposition: ***  Subjective:  ***  Objective: Temp:  [97.5 F (36.4 C)-99.7 F (37.6 C)] 97.5 F (36.4 C) (03/28 0747) Pulse Rate:  [82-100] 92 (03/28 0747) Resp:  [18] 18 (03/28 0747) BP: (126-176)/(87-108) 138/108 (03/28 0747) SpO2:  [92 %-98 %] 93 % (03/28 0747) Physical Exam: General: *** Cardiovascular: *** Respiratory: *** Abdomen: *** Extremities: ***  Laboratory: Recent Labs  Lab 08/22/20 1520 08/22/20 1552 08/23/20 0157  WBC 4.8  --  5.8  HGB 15.7* 16.0* 15.1*  HCT 46.8* 47.0* 43.9  PLT 228  --  204   Recent Labs  Lab 08/22/20 1520 08/22/20 1552 08/23/20 1122  NA 141 142 141  K 4.6 4.6 3.9  CL 106 106 109  CO2 26  --  23  BUN 12 14 8   CREATININE 1.33* 1.30* 1.23*  CALCIUM 10.1  --  9.9  PROT 7.0  --   --   BILITOT 0.7  --   --   ALKPHOS 51  --   --   ALT 12  --   --   AST 15  --   --   GLUCOSE 102* 99 103*    ***  Imaging/Diagnostic Tests: ***  Shary Key, DO 08/24/2020, 9:51 AM PGY-***, [place] Intern pager: [phone], text pages welcome

## 2020-08-24 NOTE — Progress Notes (Signed)
Neurology Progress Note  Patient ID: Becky Leach is a 87 y.o. with PMHx of  has a past medical history of AKI (acute kidney injury) ([place]), Arthritis, Closed fracture of left proximal humerus (09/14/2017), Constipation, DVT (deep venous thrombosis) ([place]), Herniated disc, Hypertension, Pulmonary embolism ([place]), Seasonal allergies, Spinal stenosis, Stomach ulcer, Tachycardia, UTI (lower urinary tract infection), and Vitamin D deficiency (09/14/2017).  Initially consulted for: transient right upper extremity weakness.    Major interval events:  MRI brain shows no acute intracranial abnormality. .   Subjective:  pt reports feeling much improved.no complaints.  Carotid ultrasound shows no significant extracranial stenosis.  Echocardiogram yesterday showed ejection fraction of 65 to 70% without cardiac source of embolism.  Neurological exam is unchanged  Exam: Vitals:   08/24/20 1239 08/24/20 1544  BP: (!) 128/91 103/84  Pulse: (!) 103 (!) 102  Resp: 18 16  Temp: 97.6 F (36.4 C) 98 F (36.7 C)  SpO2: 95% 95%   Gen: In bed, comfortable  Resp: non-labored breathing, no grossly audible wheezing Cardiac: Perfusing extremities well  Abd: soft, nt   Mental Status: Awake alert oriented to self, place.  Unable to tell me the correct month, but otherwise intact. Becky Leach appears thin and not well nourished.  Language: speech is nondysarthric.  Naming, repetition, fluency, and comprehension intact, although attention concentration is mildly reduced Cranial Nerves: PERRL. EOMI, visual fields full, no facial asymmetry, facial sensation intact, hearing intact, tongue/uvula/soft palate midline, normal sternocleidomastoid and trapezius muscle strength. No evidence of tongue atrophy or fibrillations Motor: Left upper extremities 5/5 without vertical drift. Right upper extremity nearly full strength without any drift.  Left lower extremity examination is limited by severe pain and arthritis and Becky Leach was only able  to give me a 4/5 at the hip flexors at best with some drift. Tone: is normal and bulk is normal Sensation- Intact to light touch bilaterally Coordination: FTN intact bilaterally-difficulty performing heel-knee-shin testing lower extremities due to arthritis. Gait- deferred   Pertinent Labs:  Results for JAVAE, BRAATEN (MRN [id number]) as of 08/23/2020 10:39  Ref. Range 08/23/2020 01:57  Total CHOL/HDL Ratio Latest Units: RATIO 2.6  Cholesterol Latest Ref Range: 0 - 200 mg/dL 203 (H)  HDL Cholesterol Latest Ref Range: >40 mg/dL 77  LDL (calc) Latest Ref Range: 0 - 99 mg/dL 119 (H)  Triglycerides Latest Ref Range: <150 mg/dL 33  VLDL Latest Ref Range: 0 - 40 mg/dL 7  WBC Latest Ref Range: 4.0 - 10.5 K/uL 5.8  RBC Latest Ref Range: 3.87 - 5.11 MIL/uL 5.10  Hemoglobin Latest Ref Range: 12.0 - 15.0 g/dL 15.1 (H)  HCT Latest Ref Range: 36.0 - 46.0 % 43.9  MCV Latest Ref Range: 80.0 - 100.0 fL 86.1  MCH Latest Ref Range: 26.0 - 34.0 pg 29.6  MCHC Latest Ref Range: 30.0 - 36.0 g/dL 34.4  RDW Latest Ref Range: 11.5 - 15.5 % 14.6  Platelets Latest Ref Range: 150 - 400 K/uL 204  nRBC Latest Ref Range: 0.0 - 0.2 % 0.0  Hemoglobin A1C Latest Ref Range: 4.8 - 5.6 % 6.0 (H)   Echocardiogram: done and results pending COVID: negative MRI/MRA head and neck:  1. No acute intracranial abnormality. 2. Normal intracranial MRA. 3. Advanced chronic small vessel disease and generalized volume Loss.  Impression: 87 year old female admitted with transient right upper extremity weakness. MRI negative. Exam and imaging consistent with TIA  Recommendations: -   Continue Eliquis for stroke prevention.  Aggressive risk factor  modification.  Transfer to rehabilitation when bed available.  Stroke team will sign off.  Kindly call for questions. Antony Contras, MD

## 2020-08-24 NOTE — Progress Notes (Signed)
Occupational Therapy Treatment Patient Details Name: Becky Leach MRN: [id number] DOB: 06-18-1929 Today's Date: 08/24/2020    History of present illness Becky Leach is a 87 y.o. female presenting with transient generalized weakness and slurred speech. Dx: TIA. PMH is significant for GERD, OA, h/o PE, h/o HTN   OT comments  Pt making steady progress towards OT goals this session. Pt continues to present with generalized deconditioning, impaired balance, and decreased activity tolerance impacting pts ability to complete BADLs independently. Pt currently requires MIN A for functional mobility with RW with pt needing most assist managing RW with transitional turns. Pt completed toileting needing MINA for clothing mgmt and LB ADLS from [facility] with supervision- set- up assist from sitting. Pt with slight difficulties following 2 step commands during mobility tasks ( see cog section). Pt would continue to benefit from skilled occupational therapy while admitted and after d/c to address the below listed limitations in order to improve overall functional mobility and facilitate independence with BADL participation. DC plan remains appropriate, will follow acutely per POC.     Follow Up Recommendations  Home health OT;Supervision/Assistance - 24 hour;Other (comment) ((son confirmed with PT that he can provide level of care))    Equipment Recommendations  None recommended by OT    Recommendations for Other Services      Precautions / Restrictions Precautions Precautions: Fall Restrictions Weight Bearing Restrictions: No       Mobility Bed Mobility Overal bed mobility: Needs Assistance Bed Mobility: Supine to Sit     Supine to sit: Min guard;HOB elevated Sit to supine: Min guard   General bed mobility comments: min guard assist for safety and line mgmt, cues neeeded for sequencing task    Transfers Overall transfer level: Needs assistance Equipment used: Rolling walker (2  wheeled) Transfers: Sit to/from Stand Sit to Stand: Min guard         General transfer comment: minguard to rise from EOB and BSC, good hand placement and technique noted    Balance Overall balance assessment: Needs assistance Sitting-balance support: Feet supported Sitting balance-Leahy Scale: Fair Sitting balance - Comments: sitting EOB to don shoes with no UE support with no LOB   Standing balance support: Single extremity supported;During functional activity Standing balance-Leahy Scale: Poor Standing balance comment: at least one UE supported during dynamic ADLs                           ADL either performed or assessed with clinical judgement   ADL Overall ADL's : Needs assistance/impaired     Grooming: Wash/dry face;Sitting;Set up Grooming Details (indicate cue type and reason): sitting on BSC, pt requesting to wash face     Lower Body Bathing: Set up;Sitting/lateral leans Lower Body Bathing Details (indicate cue type and reason): pt able to reach LB from [facility] with set- up assist of wash cloth Upper Body Dressing : Set up;Sitting Upper Body Dressing Details (indicate cue type and reason): to don posterior gown as back side cover Lower Body Dressing: Supervision/safety;Set up;Sitting/lateral leans Lower Body Dressing Details (indicate cue type and reason): pt able to don new socks and don slide on shoes with set- up assist Toilet Transfer: Minimal assistance;Ambulation;BSC;RW Toilet Transfer Details (indicate cue type and reason): [place]  for balance and RW mgmt Toileting- Clothing Manipulation and Hygiene: Minimal assistance;Sit to/from stand Toileting - Clothing Manipulation Details (indicate cue type and reason): MIN A for clothing mgmt only, set- up assist of  wash cloth     Functional mobility during ADLs: Minimal assistance;Cueing for safety;Rolling walker General ADL Comments: pt continues to present with generalized deconditioning, impaired balance,  decreased activity tolerance. pt with difficulty following 2 step commands related to mobility tasks and trail making tasks     Vision   Additional Comments: nto visual deficits noted during session however pt with difficulty following commands related to visual tasks   Perception     Praxis      Cognition Arousal/Alertness: Awake/alert Behavior During Therapy: WFL for tasks assessed/performed Overall Cognitive Status: Impaired/Different from baseline Area of Impairment: Orientation;Following commands;Problem solving;Memory                 Orientation Level: Disoriented to;Time   Memory: Decreased short-term memory Following Commands: Follows multi-step commands inconsistently     Problem Solving: Slow processing;Decreased initiation;Difficulty sequencing General Comments: pt unable to problem solve correct year when 2 options provided such as "1922 vs 2022" pt states "2000" pt able to state correct month and location. attempted trail making task where pt instructed to follow one and 2 step commands. pt with difficulty sequencing 2 step commands needing MOD cues to problem solve task. pt additionally with issues recalling instructions from beginning of session.        Exercises     Shoulder Instructions       General Comments HR max 114 bpm, pt reports her son can assist her as needed at home    Pertinent Vitals/ Pain       Pain Assessment: No/denies pain Faces Pain Scale: No hurt  Home Living                                          Prior Functioning/Environment              Frequency  Min 2X/week        Progress Toward Goals  OT Goals(current goals can now be found in the care plan section)  Progress towards OT goals: Progressing toward goals  Acute Rehab OT Goals Patient Stated Goal: to go home OT Goal Formulation: With patient Time For Goal Achievement: 09/06/20 Potential to Achieve Goals: Good  Plan Discharge plan remains  appropriate;Frequency remains appropriate    Co-evaluation                 AM-PAC OT "6 Clicks" Daily Activity     Outcome Measure   Help from another person eating meals?: None Help from another person taking care of personal grooming?: A Little Help from another person toileting, which includes using toliet, bedpan, or urinal?: A Little Help from another person bathing (including washing, rinsing, drying)?: A Little Help from another person to put on and taking off regular upper body clothing?: None Help from another person to put on and taking off regular lower body clothing?: None 6 Click Score: 21    End of Session Equipment Utilized During Treatment: Gait belt;Rolling walker  OT Visit Diagnosis: Unsteadiness on feet (R26.81);Muscle weakness (generalized) (M62.81)   Activity Tolerance Patient tolerated treatment well   Patient Left in chair;with call bell/phone within reach;with chair alarm set   Nurse Communication Mobility status        Time: 7062-3762 OT Time Calculation (min): 41 min  Charges: OT General Charges $OT Visit: 1 Visit OT Treatments $Self Care/Home Management : 38-52 mins  [place].,  COTA/L Acute Rehabilitation Services [phone]   Cina Klumpp 08/24/2020, 1:18 PM

## 2020-08-24 NOTE — Progress Notes (Signed)
Pt discharged at this time.  Son at bedside.  Verbalizes understanding of all discharge instructions, follow up appointments, and where to pick up prescriptions.  Pt has all belongings with her which were her clothes.

## 2020-08-24 NOTE — Progress Notes (Signed)
Physical Therapy Treatment Patient Details Name: Becky Leach MRN: [id number] DOB: 1930-02-16 Today's Date: 08/24/2020    History of Present Illness Becky Leach is a 87 y.o. female presenting with transient generalized weakness and slurred speech. Dx: TIA. PMH is significant for GERD, OA, h/o PE, h/o HTN    PT Comments    Pt progressing towards physical therapy goals. Was able to perform transfers and ambulation with gross min guard assist and RW for support. Pt fatigues quickly with OOB mobility, and will continue to benefit from home health therapies to improve tolerance for functional activity, decrease risk for falls, and maximize independence at home. Will continue to follow.     Follow Up Recommendations  Home health PT;Supervision/Assistance - 24 hour     Equipment Recommendations  None recommended by PT    Recommendations for Other Services       Precautions / Restrictions Precautions Precautions: Fall Restrictions Weight Bearing Restrictions: No    Mobility  Bed Mobility Overal bed mobility: Needs Assistance Bed Mobility: Sit to Supine       Sit to supine: Min guard   General bed mobility comments: Returned to supine via long sitting first, wiggling hips back up towards pillow, and then extending trunk back to supine. No assist required.    Transfers Overall transfer level: Needs assistance Equipment used: Rolling walker (2 wheeled) Transfers: Sit to/from Stand Sit to Stand: Min guard         General transfer comment: Close guard for safety but no assist required.  Ambulation/Gait Ambulation/Gait assistance: Min guard Gait Distance (Feet): 150 Feet Assistive device: Rolling walker (2 wheeled) Gait Pattern/deviations: Step-through pattern;Decreased stride length;Trunk flexed Gait velocity: decreased Gait velocity interpretation: 1.31 - 2.62 ft/sec, indicative of limited community ambulator General Gait Details: Significantly flexed trunk and slow  pace, slowing even more as pt fatigued. Difficulty managing through tighter spaces with the RW and required assistance.   Stairs             Wheelchair Mobility    Modified Rankin (Stroke Patients Only) Modified Rankin (Stroke Patients Only) Pre-Morbid Rankin Score: Moderate disability Modified Rankin: Moderately severe disability     Balance Overall balance assessment: Needs assistance Sitting-balance support: Feet supported Sitting balance-Leahy Scale: Fair     Standing balance support: Bilateral upper extremity supported Standing balance-Leahy Scale: Poor Standing balance comment: 1 UE support to steady while standing to clean herself up after toileting.                            Cognition Arousal/Alertness: Awake/alert Behavior During Therapy: WFL for tasks assessed/performed Overall Cognitive Status: Impaired/Different from baseline Area of Impairment: Orientation;Following commands;Problem solving;Memory                 Orientation Level: Disoriented to;Time   Memory: Decreased short-term memory Following Commands: Follows multi-step commands inconsistently     Problem Solving: Slow processing;Decreased initiation;Difficulty sequencing General Comments: pt unable to problem solve correct year when 2 options provided such as "1922 vs 2022" pt states "2000" pt able to state correct month and location. attempted trail making task where pt instructed to follow one and step commands. pt with difficulty sequencing 2 step commands needing MOD cues to problem solve task. pt additionally with issues recalling instructions from beginning of session.      Exercises      General Comments        Pertinent Vitals/Pain Pain Assessment:  No/denies pain Faces Pain Scale: No hurt    Home Living                      Prior Function            PT Goals (current goals can now be found in the care plan section) Acute Rehab PT Goals Patient  Stated Goal: to go home PT Goal Formulation: With patient Time For Goal Achievement: 09/06/20 Potential to Achieve Goals: Good Progress towards PT goals: Progressing toward goals    Frequency    Min 3X/week      PT Plan Current plan remains appropriate    Co-evaluation              AM-PAC PT "6 Clicks" Mobility   Outcome Measure  Help needed turning from your back to your side while in a flat bed without using bedrails?: None Help needed moving from lying on your back to sitting on the side of a flat bed without using bedrails?: A Little Help needed moving to and from a bed to a chair (including a wheelchair)?: A Little Help needed standing up from a chair using your arms (e.g., wheelchair or bedside chair)?: A Little Help needed to walk in hospital room?: A Little Help needed climbing 3-5 steps with a railing? : A Little 6 Click Score: 19    End of Session Equipment Utilized During Treatment: Gait belt Activity Tolerance: Patient tolerated treatment well Patient left: in bed;with call bell/phone within reach;with bed alarm set Nurse Communication: Mobility status PT Visit Diagnosis: Unsteadiness on feet (R26.81);Muscle weakness (generalized) (M62.81);Difficulty in walking, not elsewhere classified (R26.2)     Time: 7121-9758 PT Time Calculation (min) (ACUTE ONLY): 33 min  Charges:  $Gait Training: 23-37 mins                     Rolinda Roan, PT, DPT Acute Rehabilitation Services Pager: [phone] Office: [phone]    Thelma Comp 08/24/2020, 1:15 PM

## 2020-08-24 NOTE — Progress Notes (Signed)
Discussed with Mr Hulsey regarding discharging his mom home. He clarified he would like her to be on ASA 81mg  for 2 weeks if this was the neurologists recommendations. I confirmed that this is what the neurologist recommended. I explained I have will sent ASA, eliquis and atorvastatin to their pharmacy to collect. Also explained I have placed [facility] orders for OT and PT. He was appreciative. Pt is discharged.  Lattie Haw MD  [place]

## 2020-08-24 NOTE — Discharge Instructions (Signed)
You were admitted to the hospital for a work up for a Transient Ischemic Attack. You had brain imaging that did not show any signs of stroke. You had an echocardiogram that showed mild diastolic dysfunction. You had an ultrasound of your carotid arteries that showed minimal wall thickening or plaque. Neurology recommended taking aspirin 81 mg daily x2 weeks along with your home eliquis indefinitely. You were also started on a statin to help prevent strokes and heart attacks. Speech language pathology recommend continuing dyspgia 3/nectar liquids/thick liquid. Follow up with PCP in 2 weeks

## 2020-08-24 NOTE — TOC Transition Note (Signed)
Transition of Care [facility]) - CM/SW Discharge Note   Patient Details  Name: Becky Leach MRN: [id number] Date of Birth: 1929/09/01  [facility]) CM/SW Contact:  Pollie Friar, RN Phone Number: 08/24/2020, 4:21 PM   Clinical Narrative:    Patient discharging home with [facility] services through [place].  No new DME needs.  Pt has supervision at home and transportation to home.     Final next level of care: Home w Home Health Services Barriers to Discharge: No Barriers Identified   Patient Goals and CMS Choice   CMS Medicare.gov Compare Post Acute Care list provided to:: Patient Choice offered to / list presented to : [place]  Discharge Placement                       Discharge Plan and Services   Discharge Planning Services: CM Consult Post Acute Care Choice: [place]: PT,OT,Speech Therapy [place]: [place] ([place]) Date [place]: 08/23/20   Representative spoke with at [place]: [place] ([place]) Interventions     Readmission Risk Interventions No flowsheet data found.

## 2020-08-24 NOTE — Progress Notes (Signed)
VASCULAR LAB    Carotid duplex has been performed.  See CV proc for preliminary results.   Stanley Lyness, RVT 08/24/2020, 12:23 PM

## 2020-08-25 DIAGNOSIS — Z7982 Long term (current) use of aspirin: Secondary | ICD-10-CM | POA: Diagnosis not present

## 2020-08-25 DIAGNOSIS — K219 Gastro-esophageal reflux disease without esophagitis: Secondary | ICD-10-CM | POA: Diagnosis not present

## 2020-08-25 DIAGNOSIS — Z7901 Long term (current) use of anticoagulants: Secondary | ICD-10-CM | POA: Diagnosis not present

## 2020-08-25 DIAGNOSIS — M48061 Spinal stenosis, lumbar region without neurogenic claudication: Secondary | ICD-10-CM | POA: Diagnosis not present

## 2020-08-25 DIAGNOSIS — N183 Chronic kidney disease, stage 3 unspecified: Secondary | ICD-10-CM | POA: Diagnosis not present

## 2020-08-25 DIAGNOSIS — M170 Bilateral primary osteoarthritis of knee: Secondary | ICD-10-CM | POA: Diagnosis not present

## 2020-08-25 DIAGNOSIS — Z9181 History of falling: Secondary | ICD-10-CM | POA: Diagnosis not present

## 2020-08-25 DIAGNOSIS — E559 Vitamin D deficiency, unspecified: Secondary | ICD-10-CM | POA: Diagnosis not present

## 2020-08-25 DIAGNOSIS — Z86711 Personal history of pulmonary embolism: Secondary | ICD-10-CM | POA: Diagnosis not present

## 2020-08-25 DIAGNOSIS — G500 Trigeminal neuralgia: Secondary | ICD-10-CM | POA: Diagnosis not present

## 2020-08-25 DIAGNOSIS — Z8781 Personal history of (healed) traumatic fracture: Secondary | ICD-10-CM | POA: Diagnosis not present

## 2020-08-25 DIAGNOSIS — I129 Hypertensive chronic kidney disease with stage 1 through stage 4 chronic kidney disease, or unspecified chronic kidney disease: Secondary | ICD-10-CM | POA: Diagnosis not present

## 2020-08-25 DIAGNOSIS — Z9089 Acquired absence of other organs: Secondary | ICD-10-CM | POA: Diagnosis not present

## 2020-08-25 DIAGNOSIS — Z8673 Personal history of transient ischemic attack (TIA), and cerebral infarction without residual deficits: Secondary | ICD-10-CM | POA: Diagnosis not present

## 2020-08-25 DIAGNOSIS — E785 Hyperlipidemia, unspecified: Secondary | ICD-10-CM | POA: Diagnosis not present

## 2020-08-25 DIAGNOSIS — R1310 Dysphagia, unspecified: Secondary | ICD-10-CM | POA: Diagnosis not present

## 2020-08-25 DIAGNOSIS — Z86718 Personal history of other venous thrombosis and embolism: Secondary | ICD-10-CM | POA: Diagnosis not present

## 2020-08-25 DIAGNOSIS — Z8711 Personal history of peptic ulcer disease: Secondary | ICD-10-CM | POA: Diagnosis not present

## 2020-08-26 ENCOUNTER — Telehealth: Payer: Self-pay

## 2020-08-26 DIAGNOSIS — N183 Chronic kidney disease, stage 3 unspecified: Secondary | ICD-10-CM | POA: Diagnosis not present

## 2020-08-26 DIAGNOSIS — G500 Trigeminal neuralgia: Secondary | ICD-10-CM | POA: Diagnosis not present

## 2020-08-26 DIAGNOSIS — E785 Hyperlipidemia, unspecified: Secondary | ICD-10-CM | POA: Diagnosis not present

## 2020-08-26 DIAGNOSIS — M170 Bilateral primary osteoarthritis of knee: Secondary | ICD-10-CM | POA: Diagnosis not present

## 2020-08-26 DIAGNOSIS — K219 Gastro-esophageal reflux disease without esophagitis: Secondary | ICD-10-CM | POA: Diagnosis not present

## 2020-08-26 DIAGNOSIS — I129 Hypertensive chronic kidney disease with stage 1 through stage 4 chronic kidney disease, or unspecified chronic kidney disease: Secondary | ICD-10-CM | POA: Diagnosis not present

## 2020-08-26 NOTE — Telephone Encounter (Signed)
Becky Leach [facility] PT calls nurse line requesting verbal orders for [name] PT as follows.  1x a week for 1 week  2x a week for 2 weeks  1x a week for 6 weeks   Verbal orders given per [facility] protocol.

## 2020-08-31 DIAGNOSIS — N183 Chronic kidney disease, stage 3 unspecified: Secondary | ICD-10-CM | POA: Diagnosis not present

## 2020-08-31 DIAGNOSIS — G500 Trigeminal neuralgia: Secondary | ICD-10-CM | POA: Diagnosis not present

## 2020-08-31 DIAGNOSIS — E785 Hyperlipidemia, unspecified: Secondary | ICD-10-CM | POA: Diagnosis not present

## 2020-08-31 DIAGNOSIS — M170 Bilateral primary osteoarthritis of knee: Secondary | ICD-10-CM | POA: Diagnosis not present

## 2020-08-31 DIAGNOSIS — I129 Hypertensive chronic kidney disease with stage 1 through stage 4 chronic kidney disease, or unspecified chronic kidney disease: Secondary | ICD-10-CM | POA: Diagnosis not present

## 2020-08-31 DIAGNOSIS — K219 Gastro-esophageal reflux disease without esophagitis: Secondary | ICD-10-CM | POA: Diagnosis not present

## 2020-09-03 DIAGNOSIS — M170 Bilateral primary osteoarthritis of knee: Secondary | ICD-10-CM | POA: Diagnosis not present

## 2020-09-03 DIAGNOSIS — E785 Hyperlipidemia, unspecified: Secondary | ICD-10-CM | POA: Diagnosis not present

## 2020-09-03 DIAGNOSIS — K219 Gastro-esophageal reflux disease without esophagitis: Secondary | ICD-10-CM | POA: Diagnosis not present

## 2020-09-03 DIAGNOSIS — I129 Hypertensive chronic kidney disease with stage 1 through stage 4 chronic kidney disease, or unspecified chronic kidney disease: Secondary | ICD-10-CM | POA: Diagnosis not present

## 2020-09-03 DIAGNOSIS — N183 Chronic kidney disease, stage 3 unspecified: Secondary | ICD-10-CM | POA: Diagnosis not present

## 2020-09-03 DIAGNOSIS — G500 Trigeminal neuralgia: Secondary | ICD-10-CM | POA: Diagnosis not present

## 2020-09-07 ENCOUNTER — Telehealth: Payer: Self-pay | Admitting: Pulmonary Disease

## 2020-09-07 DIAGNOSIS — I8290 Acute embolism and thrombosis of unspecified vein: Secondary | ICD-10-CM

## 2020-09-07 DIAGNOSIS — K219 Gastro-esophageal reflux disease without esophagitis: Secondary | ICD-10-CM | POA: Diagnosis not present

## 2020-09-07 DIAGNOSIS — Z86711 Personal history of pulmonary embolism: Secondary | ICD-10-CM

## 2020-09-07 DIAGNOSIS — M170 Bilateral primary osteoarthritis of knee: Secondary | ICD-10-CM | POA: Diagnosis not present

## 2020-09-07 DIAGNOSIS — I129 Hypertensive chronic kidney disease with stage 1 through stage 4 chronic kidney disease, or unspecified chronic kidney disease: Secondary | ICD-10-CM | POA: Diagnosis not present

## 2020-09-07 DIAGNOSIS — N183 Chronic kidney disease, stage 3 unspecified: Secondary | ICD-10-CM | POA: Diagnosis not present

## 2020-09-07 DIAGNOSIS — G500 Trigeminal neuralgia: Secondary | ICD-10-CM | POA: Diagnosis not present

## 2020-09-07 DIAGNOSIS — E785 Hyperlipidemia, unspecified: Secondary | ICD-10-CM | POA: Diagnosis not present

## 2020-09-07 MED ORDER — APIXABAN 2.5 MG PO TABS: 2.5000 mg | ORAL_TABLET | Freq: Two times a day (BID) | ORAL | 1 refills | Status: DC

## 2020-09-07 NOTE — Telephone Encounter (Signed)
pt son is calling because pt needs a refill for elliquis for a 3 month supply . States that Dr. Vaughan Browner usually fills it but when pt was in ER another Dr. wrote a one month RX because she had ran out..  Uses [facility] on [address]  please advise [phone]

## 2020-09-07 NOTE — Telephone Encounter (Signed)
Refill has been sent to the pharmacy. Nothing further is needed. 

## 2020-09-08 ENCOUNTER — Ambulatory Visit (INDEPENDENT_AMBULATORY_CARE_PROVIDER_SITE_OTHER): Payer: Medicare Other | Admitting: Student in an Organized Health Care Education/Training Program

## 2020-09-08 ENCOUNTER — Other Ambulatory Visit: Payer: Self-pay

## 2020-09-08 ENCOUNTER — Telehealth: Payer: Self-pay

## 2020-09-08 ENCOUNTER — Encounter: Payer: Self-pay | Admitting: Student in an Organized Health Care Education/Training Program

## 2020-09-08 VITALS — BP 128/84 | HR 100 | Wt 110.2 lb

## 2020-09-08 DIAGNOSIS — G459 Transient cerebral ischemic attack, unspecified: Secondary | ICD-10-CM

## 2020-09-08 DIAGNOSIS — N179 Acute kidney failure, unspecified: Secondary | ICD-10-CM

## 2020-09-08 DIAGNOSIS — D751 Secondary polycythemia: Secondary | ICD-10-CM

## 2020-09-08 MED ORDER — ATORVASTATIN CALCIUM 40 MG PO TABS
40.0000 mg | ORAL_TABLET | Freq: Every day | ORAL | 1 refills | Status: DC
Start: 2020-09-08 — End: 2021-02-08

## 2020-09-08 NOTE — Progress Notes (Signed)
   SUBJECTIVE:   CHIEF COMPLAINT / HPI: hosp f/u  hosp f/u s/p ischemic cerebral event- Doing well overall since discharge.  Adherent with discharge treatments including medications and therapies.  She states that she she is participating with PT including yesterday and it is going well. She's had two sessions with SLP and does not like the thickened liquids but has two more sessions with them. Needs refills of atorvastatin.  Has not discontinued ASA yet as they wanted to wait until after this appointment.   OBJECTIVE:   BP 128/84   Pulse 100   Wt 110 lb 3.2 oz (50 kg)   SpO2 97%   BMI 18.34 kg/m   Physical Exam Vitals and nursing note reviewed. Exam conducted with a chaperone present.  Constitutional:      General: She is not in acute distress.    Appearance: She is not ill-appearing or toxic-appearing.  HENT:     Head: Normocephalic.     Mouth/Throat:     Mouth: Mucous membranes are dry.  Eyes:     Conjunctiva/sclera: Conjunctivae normal.  Cardiovascular:     Rate and Rhythm: Normal rate and regular rhythm.     Pulses: Normal pulses.  Pulmonary:     Effort: Pulmonary effort is normal.     Breath sounds: Normal breath sounds.  Abdominal:     General: Abdomen is flat.     Palpations: Abdomen is soft.     Tenderness: There is no abdominal tenderness.  Skin:    General: Skin is warm and dry.     Capillary Refill: Capillary refill takes less than 2 seconds.  Neurological:     Mental Status: She is alert. Mental status is at baseline.     Cranial Nerves: Facial asymmetry (dropping of R corner of mouth, does not correct with smiling) present. No dysarthria.     Sensory: Sensation is intact.     Motor: Weakness (symetric weakness in upper and lower extremities. ~3/5 and wheelchair-bound) present.  Psychiatric:        Mood and Affect: Mood normal.    ASSESSMENT/PLAN:   TIA (transient ischemic attack) Patient symptoms mild and endorses return to baseline of strength.  Continues to have asymmetry of mouth. - continue physical therapy and SLP - continue atorvastatin- refilled today - discontinue ASA as instructed by neuro- patient and son expressed understanding and agreement - continue eliquis - f/u with neuro - repeat lipids in 4-6 months  AKI (acute kidney injury) ([place]) Repeated BMP today to monitor return to baseline Patient continues to endorse good PO hydration  Polycythemia hgb elevated during hospitalization Repeat CBC today to monitor returned to baseline. Will follow up at next physical     Richarda Osmond, [place]

## 2020-09-08 NOTE — Patient Instructions (Signed)
It was a pleasure to see you today!  To summarize our discussion for this visit:  It looks like you are recovering well from your stroke. I recommend you discontinue you aspirin now  Please continue atorvastatin and eliquis  We will check your blood today to see if previous abnormalities in red blood cells and kidneys have improved.  Continue with therapies and let me know if you need anything further.   Some additional health maintenance measures we should update are: Health Maintenance Due  Topic Date Due  . COVID-19 Vaccine (1) Never done  . DEXA SCAN  Never done  .    Call the clinic at [phone] if your symptoms worsen or you have any concerns.   Thank you for allowing me to take part in your care,  Dr. Doristine Mango

## 2020-09-08 NOTE — Telephone Encounter (Signed)
Becky Leach from [facility] calling for Speech therapy verbal orders as follows:  2 time(s) weekly for 4 week(s) Verbal orders given per [facility] protocol  Talbot Grumbling, RN

## 2020-09-09 DIAGNOSIS — G459 Transient cerebral ischemic attack, unspecified: Secondary | ICD-10-CM | POA: Insufficient documentation

## 2020-09-09 DIAGNOSIS — D751 Secondary polycythemia: Secondary | ICD-10-CM | POA: Insufficient documentation

## 2020-09-09 HISTORY — DX: Transient cerebral ischemic attack, unspecified: G45.9

## 2020-09-09 LAB — BASIC METABOLIC PANEL
BUN/Creatinine Ratio: 10 — ABNORMAL LOW (ref 12–28)
BUN: 12 mg/dL (ref 8–27)
CO2: 23 mmol/L (ref 20–29)
Calcium: 9.7 mg/dL (ref 8.7–10.3)
Chloride: 106 mmol/L (ref 96–106)
Creatinine, Ser: 1.18 mg/dL — ABNORMAL HIGH (ref 0.57–1.00)
Glucose: 85 mg/dL (ref 65–99)
Potassium: 4.7 mmol/L (ref 3.5–5.2)
Sodium: 145 mmol/L — ABNORMAL HIGH (ref 134–144)
eGFR: 44 mL/min/{1.73_m2} — ABNORMAL LOW (ref >59)

## 2020-09-09 LAB — CBC
Hematocrit: 45 % (ref 34.0–46.6)
Hemoglobin: 15 g/dL (ref 11.1–15.9)
MCH: 29.3 pg (ref 26.6–33.0)
MCHC: 33.3 g/dL (ref 31.5–35.7)
MCV: 88 fL (ref 79–97)
Platelets: 227 10*3/uL (ref 150–450)
RBC: 5.12 x10E6/uL (ref 3.77–5.28)
RDW: 14.4 % (ref 11.7–15.4)
WBC: 5 10*3/uL (ref 3.4–10.8)

## 2020-09-09 NOTE — Assessment & Plan Note (Signed)
Patient symptoms mild and endorses return to baseline of strength. Continues to have asymmetry of mouth. - continue physical therapy and SLP - continue atorvastatin- refilled today - discontinue ASA as instructed by neuro- patient and son expressed understanding and agreement - continue eliquis - f/u with neuro - repeat lipids in 4-6 months

## 2020-09-09 NOTE — Assessment & Plan Note (Addendum)
hgb elevated during hospitalization Repeat CBC today to monitor returned to baseline. Will follow up at next physical

## 2020-09-09 NOTE — Assessment & Plan Note (Addendum)
Repeated BMP today to monitor return to baseline Patient continues to endorse good PO hydration

## 2020-09-10 DIAGNOSIS — N183 Chronic kidney disease, stage 3 unspecified: Secondary | ICD-10-CM | POA: Diagnosis not present

## 2020-09-10 DIAGNOSIS — E785 Hyperlipidemia, unspecified: Secondary | ICD-10-CM | POA: Diagnosis not present

## 2020-09-10 DIAGNOSIS — K219 Gastro-esophageal reflux disease without esophagitis: Secondary | ICD-10-CM | POA: Diagnosis not present

## 2020-09-10 DIAGNOSIS — I129 Hypertensive chronic kidney disease with stage 1 through stage 4 chronic kidney disease, or unspecified chronic kidney disease: Secondary | ICD-10-CM | POA: Diagnosis not present

## 2020-09-10 DIAGNOSIS — G500 Trigeminal neuralgia: Secondary | ICD-10-CM | POA: Diagnosis not present

## 2020-09-10 DIAGNOSIS — M170 Bilateral primary osteoarthritis of knee: Secondary | ICD-10-CM | POA: Diagnosis not present

## 2020-09-14 DIAGNOSIS — I129 Hypertensive chronic kidney disease with stage 1 through stage 4 chronic kidney disease, or unspecified chronic kidney disease: Secondary | ICD-10-CM | POA: Diagnosis not present

## 2020-09-14 DIAGNOSIS — E785 Hyperlipidemia, unspecified: Secondary | ICD-10-CM | POA: Diagnosis not present

## 2020-09-14 DIAGNOSIS — N183 Chronic kidney disease, stage 3 unspecified: Secondary | ICD-10-CM | POA: Diagnosis not present

## 2020-09-14 DIAGNOSIS — G500 Trigeminal neuralgia: Secondary | ICD-10-CM | POA: Diagnosis not present

## 2020-09-14 DIAGNOSIS — M170 Bilateral primary osteoarthritis of knee: Secondary | ICD-10-CM | POA: Diagnosis not present

## 2020-09-14 DIAGNOSIS — K219 Gastro-esophageal reflux disease without esophagitis: Secondary | ICD-10-CM | POA: Diagnosis not present

## 2020-09-16 ENCOUNTER — Telehealth: Payer: Self-pay

## 2020-09-16 DIAGNOSIS — G459 Transient cerebral ischemic attack, unspecified: Secondary | ICD-10-CM

## 2020-09-16 NOTE — Telephone Encounter (Signed)
Patient's son calls nurse line requesting a transfer shower bench. Please route back to "RN team" for processing once order has been placed.   To PCP  Talbot Grumbling, RN

## 2020-09-17 DIAGNOSIS — M170 Bilateral primary osteoarthritis of knee: Secondary | ICD-10-CM | POA: Diagnosis not present

## 2020-09-17 DIAGNOSIS — K219 Gastro-esophageal reflux disease without esophagitis: Secondary | ICD-10-CM | POA: Diagnosis not present

## 2020-09-17 DIAGNOSIS — G500 Trigeminal neuralgia: Secondary | ICD-10-CM | POA: Diagnosis not present

## 2020-09-17 DIAGNOSIS — N183 Chronic kidney disease, stage 3 unspecified: Secondary | ICD-10-CM | POA: Diagnosis not present

## 2020-09-17 DIAGNOSIS — E785 Hyperlipidemia, unspecified: Secondary | ICD-10-CM | POA: Diagnosis not present

## 2020-09-17 DIAGNOSIS — I129 Hypertensive chronic kidney disease with stage 1 through stage 4 chronic kidney disease, or unspecified chronic kidney disease: Secondary | ICD-10-CM | POA: Diagnosis not present

## 2020-09-21 DIAGNOSIS — K219 Gastro-esophageal reflux disease without esophagitis: Secondary | ICD-10-CM | POA: Diagnosis not present

## 2020-09-21 DIAGNOSIS — E785 Hyperlipidemia, unspecified: Secondary | ICD-10-CM | POA: Diagnosis not present

## 2020-09-21 DIAGNOSIS — G500 Trigeminal neuralgia: Secondary | ICD-10-CM | POA: Diagnosis not present

## 2020-09-21 DIAGNOSIS — M170 Bilateral primary osteoarthritis of knee: Secondary | ICD-10-CM | POA: Diagnosis not present

## 2020-09-21 DIAGNOSIS — N183 Chronic kidney disease, stage 3 unspecified: Secondary | ICD-10-CM | POA: Diagnosis not present

## 2020-09-21 DIAGNOSIS — I129 Hypertensive chronic kidney disease with stage 1 through stage 4 chronic kidney disease, or unspecified chronic kidney disease: Secondary | ICD-10-CM | POA: Diagnosis not present

## 2020-09-24 ENCOUNTER — Telehealth: Payer: Self-pay

## 2020-09-24 DIAGNOSIS — E559 Vitamin D deficiency, unspecified: Secondary | ICD-10-CM | POA: Diagnosis not present

## 2020-09-24 DIAGNOSIS — Z7982 Long term (current) use of aspirin: Secondary | ICD-10-CM | POA: Diagnosis not present

## 2020-09-24 DIAGNOSIS — Z9089 Acquired absence of other organs: Secondary | ICD-10-CM | POA: Diagnosis not present

## 2020-09-24 DIAGNOSIS — Z86718 Personal history of other venous thrombosis and embolism: Secondary | ICD-10-CM | POA: Diagnosis not present

## 2020-09-24 DIAGNOSIS — K219 Gastro-esophageal reflux disease without esophagitis: Secondary | ICD-10-CM | POA: Diagnosis not present

## 2020-09-24 DIAGNOSIS — Z9181 History of falling: Secondary | ICD-10-CM | POA: Diagnosis not present

## 2020-09-24 DIAGNOSIS — Z8673 Personal history of transient ischemic attack (TIA), and cerebral infarction without residual deficits: Secondary | ICD-10-CM | POA: Diagnosis not present

## 2020-09-24 DIAGNOSIS — Z8781 Personal history of (healed) traumatic fracture: Secondary | ICD-10-CM | POA: Diagnosis not present

## 2020-09-24 DIAGNOSIS — G500 Trigeminal neuralgia: Secondary | ICD-10-CM | POA: Diagnosis not present

## 2020-09-24 DIAGNOSIS — M48061 Spinal stenosis, lumbar region without neurogenic claudication: Secondary | ICD-10-CM | POA: Diagnosis not present

## 2020-09-24 DIAGNOSIS — Z8711 Personal history of peptic ulcer disease: Secondary | ICD-10-CM | POA: Diagnosis not present

## 2020-09-24 DIAGNOSIS — Z7901 Long term (current) use of anticoagulants: Secondary | ICD-10-CM | POA: Diagnosis not present

## 2020-09-24 DIAGNOSIS — I129 Hypertensive chronic kidney disease with stage 1 through stage 4 chronic kidney disease, or unspecified chronic kidney disease: Secondary | ICD-10-CM | POA: Diagnosis not present

## 2020-09-24 DIAGNOSIS — Z86711 Personal history of pulmonary embolism: Secondary | ICD-10-CM | POA: Diagnosis not present

## 2020-09-24 DIAGNOSIS — N183 Chronic kidney disease, stage 3 unspecified: Secondary | ICD-10-CM | POA: Diagnosis not present

## 2020-09-24 DIAGNOSIS — R1310 Dysphagia, unspecified: Secondary | ICD-10-CM | POA: Diagnosis not present

## 2020-09-24 DIAGNOSIS — M170 Bilateral primary osteoarthritis of knee: Secondary | ICD-10-CM | POA: Diagnosis not present

## 2020-09-24 DIAGNOSIS — E785 Hyperlipidemia, unspecified: Secondary | ICD-10-CM | POA: Diagnosis not present

## 2020-09-24 NOTE — Telephone Encounter (Signed)
Becky Leach SLP LVM on nurse line requesting verbal orders for speech as follows.  1x a week for 4 weeks.   Verbal order given per [facility] protocol.

## 2020-09-28 DIAGNOSIS — N183 Chronic kidney disease, stage 3 unspecified: Secondary | ICD-10-CM | POA: Diagnosis not present

## 2020-09-28 DIAGNOSIS — E785 Hyperlipidemia, unspecified: Secondary | ICD-10-CM | POA: Diagnosis not present

## 2020-09-28 DIAGNOSIS — I129 Hypertensive chronic kidney disease with stage 1 through stage 4 chronic kidney disease, or unspecified chronic kidney disease: Secondary | ICD-10-CM | POA: Diagnosis not present

## 2020-09-28 DIAGNOSIS — M170 Bilateral primary osteoarthritis of knee: Secondary | ICD-10-CM | POA: Diagnosis not present

## 2020-09-28 DIAGNOSIS — G500 Trigeminal neuralgia: Secondary | ICD-10-CM | POA: Diagnosis not present

## 2020-09-28 DIAGNOSIS — K219 Gastro-esophageal reflux disease without esophagitis: Secondary | ICD-10-CM | POA: Diagnosis not present

## 2020-09-28 NOTE — Telephone Encounter (Signed)
Verified with [employer] they have received DME order.

## 2020-09-28 NOTE — Telephone Encounter (Signed)
Community message sent to [employer] for processing of shower transfer DME.

## 2020-09-30 DIAGNOSIS — K219 Gastro-esophageal reflux disease without esophagitis: Secondary | ICD-10-CM | POA: Diagnosis not present

## 2020-09-30 DIAGNOSIS — M170 Bilateral primary osteoarthritis of knee: Secondary | ICD-10-CM | POA: Diagnosis not present

## 2020-09-30 DIAGNOSIS — G500 Trigeminal neuralgia: Secondary | ICD-10-CM | POA: Diagnosis not present

## 2020-09-30 DIAGNOSIS — E785 Hyperlipidemia, unspecified: Secondary | ICD-10-CM | POA: Diagnosis not present

## 2020-09-30 DIAGNOSIS — I129 Hypertensive chronic kidney disease with stage 1 through stage 4 chronic kidney disease, or unspecified chronic kidney disease: Secondary | ICD-10-CM | POA: Diagnosis not present

## 2020-09-30 DIAGNOSIS — N183 Chronic kidney disease, stage 3 unspecified: Secondary | ICD-10-CM | POA: Diagnosis not present

## 2020-10-05 ENCOUNTER — Telehealth: Payer: Self-pay

## 2020-10-05 DIAGNOSIS — N183 Chronic kidney disease, stage 3 unspecified: Secondary | ICD-10-CM | POA: Diagnosis not present

## 2020-10-05 DIAGNOSIS — K219 Gastro-esophageal reflux disease without esophagitis: Secondary | ICD-10-CM | POA: Diagnosis not present

## 2020-10-05 DIAGNOSIS — G500 Trigeminal neuralgia: Secondary | ICD-10-CM | POA: Diagnosis not present

## 2020-10-05 DIAGNOSIS — M170 Bilateral primary osteoarthritis of knee: Secondary | ICD-10-CM | POA: Diagnosis not present

## 2020-10-05 DIAGNOSIS — I129 Hypertensive chronic kidney disease with stage 1 through stage 4 chronic kidney disease, or unspecified chronic kidney disease: Secondary | ICD-10-CM | POA: Diagnosis not present

## 2020-10-05 DIAGNOSIS — E785 Hyperlipidemia, unspecified: Secondary | ICD-10-CM | POA: Diagnosis not present

## 2020-10-05 NOTE — Telephone Encounter (Signed)
Patients son calls nurse line reporting knee pain in patient. Becky Leach reports she has been doing PT and therefore has had increased pain. Becky Leach would like to start giving her Tylenol again. Please advise.

## 2020-10-05 NOTE — Telephone Encounter (Signed)
Patients son contacted and informed.

## 2020-10-12 DIAGNOSIS — K219 Gastro-esophageal reflux disease without esophagitis: Secondary | ICD-10-CM | POA: Diagnosis not present

## 2020-10-12 DIAGNOSIS — N183 Chronic kidney disease, stage 3 unspecified: Secondary | ICD-10-CM | POA: Diagnosis not present

## 2020-10-12 DIAGNOSIS — G500 Trigeminal neuralgia: Secondary | ICD-10-CM | POA: Diagnosis not present

## 2020-10-12 DIAGNOSIS — I129 Hypertensive chronic kidney disease with stage 1 through stage 4 chronic kidney disease, or unspecified chronic kidney disease: Secondary | ICD-10-CM | POA: Diagnosis not present

## 2020-10-12 DIAGNOSIS — M170 Bilateral primary osteoarthritis of knee: Secondary | ICD-10-CM | POA: Diagnosis not present

## 2020-10-12 DIAGNOSIS — E785 Hyperlipidemia, unspecified: Secondary | ICD-10-CM | POA: Diagnosis not present

## 2020-10-15 DIAGNOSIS — I129 Hypertensive chronic kidney disease with stage 1 through stage 4 chronic kidney disease, or unspecified chronic kidney disease: Secondary | ICD-10-CM | POA: Diagnosis not present

## 2020-10-15 DIAGNOSIS — N183 Chronic kidney disease, stage 3 unspecified: Secondary | ICD-10-CM | POA: Diagnosis not present

## 2020-10-15 DIAGNOSIS — E785 Hyperlipidemia, unspecified: Secondary | ICD-10-CM | POA: Diagnosis not present

## 2020-10-15 DIAGNOSIS — M170 Bilateral primary osteoarthritis of knee: Secondary | ICD-10-CM | POA: Diagnosis not present

## 2020-10-15 DIAGNOSIS — G500 Trigeminal neuralgia: Secondary | ICD-10-CM | POA: Diagnosis not present

## 2020-10-15 DIAGNOSIS — K219 Gastro-esophageal reflux disease without esophagitis: Secondary | ICD-10-CM | POA: Diagnosis not present

## 2020-10-19 ENCOUNTER — Telehealth: Payer: Self-pay

## 2020-10-19 DIAGNOSIS — G500 Trigeminal neuralgia: Secondary | ICD-10-CM | POA: Diagnosis not present

## 2020-10-19 DIAGNOSIS — I129 Hypertensive chronic kidney disease with stage 1 through stage 4 chronic kidney disease, or unspecified chronic kidney disease: Secondary | ICD-10-CM | POA: Diagnosis not present

## 2020-10-19 DIAGNOSIS — E785 Hyperlipidemia, unspecified: Secondary | ICD-10-CM | POA: Diagnosis not present

## 2020-10-19 DIAGNOSIS — M170 Bilateral primary osteoarthritis of knee: Secondary | ICD-10-CM | POA: Diagnosis not present

## 2020-10-19 DIAGNOSIS — K219 Gastro-esophageal reflux disease without esophagitis: Secondary | ICD-10-CM | POA: Diagnosis not present

## 2020-10-19 DIAGNOSIS — N183 Chronic kidney disease, stage 3 unspecified: Secondary | ICD-10-CM | POA: Diagnosis not present

## 2020-10-19 NOTE — Telephone Encounter (Signed)
[name] from [employer] calling for speech therapy verbal orders as follows:  1 time(s) weekly for 4 week(s), starting on the week of 11/02/20.  Verbal orders given per [facility] protocol  Talbot Grumbling, RN

## 2020-10-21 DIAGNOSIS — M170 Bilateral primary osteoarthritis of knee: Secondary | ICD-10-CM | POA: Diagnosis not present

## 2020-10-21 DIAGNOSIS — K219 Gastro-esophageal reflux disease without esophagitis: Secondary | ICD-10-CM | POA: Diagnosis not present

## 2020-10-21 DIAGNOSIS — N183 Chronic kidney disease, stage 3 unspecified: Secondary | ICD-10-CM | POA: Diagnosis not present

## 2020-10-21 DIAGNOSIS — G500 Trigeminal neuralgia: Secondary | ICD-10-CM | POA: Diagnosis not present

## 2020-10-21 DIAGNOSIS — I129 Hypertensive chronic kidney disease with stage 1 through stage 4 chronic kidney disease, or unspecified chronic kidney disease: Secondary | ICD-10-CM | POA: Diagnosis not present

## 2020-10-21 DIAGNOSIS — E785 Hyperlipidemia, unspecified: Secondary | ICD-10-CM | POA: Diagnosis not present

## 2020-10-24 DIAGNOSIS — Z8711 Personal history of peptic ulcer disease: Secondary | ICD-10-CM | POA: Diagnosis not present

## 2020-10-24 DIAGNOSIS — Z86718 Personal history of other venous thrombosis and embolism: Secondary | ICD-10-CM | POA: Diagnosis not present

## 2020-10-24 DIAGNOSIS — Z9089 Acquired absence of other organs: Secondary | ICD-10-CM | POA: Diagnosis not present

## 2020-10-24 DIAGNOSIS — K219 Gastro-esophageal reflux disease without esophagitis: Secondary | ICD-10-CM | POA: Diagnosis not present

## 2020-10-24 DIAGNOSIS — Z8781 Personal history of (healed) traumatic fracture: Secondary | ICD-10-CM | POA: Diagnosis not present

## 2020-10-24 DIAGNOSIS — M170 Bilateral primary osteoarthritis of knee: Secondary | ICD-10-CM | POA: Diagnosis not present

## 2020-10-24 DIAGNOSIS — R1310 Dysphagia, unspecified: Secondary | ICD-10-CM | POA: Diagnosis not present

## 2020-10-24 DIAGNOSIS — G500 Trigeminal neuralgia: Secondary | ICD-10-CM | POA: Diagnosis not present

## 2020-10-24 DIAGNOSIS — E785 Hyperlipidemia, unspecified: Secondary | ICD-10-CM | POA: Diagnosis not present

## 2020-10-24 DIAGNOSIS — Z86711 Personal history of pulmonary embolism: Secondary | ICD-10-CM | POA: Diagnosis not present

## 2020-10-24 DIAGNOSIS — N183 Chronic kidney disease, stage 3 unspecified: Secondary | ICD-10-CM | POA: Diagnosis not present

## 2020-10-24 DIAGNOSIS — M48061 Spinal stenosis, lumbar region without neurogenic claudication: Secondary | ICD-10-CM | POA: Diagnosis not present

## 2020-10-24 DIAGNOSIS — I129 Hypertensive chronic kidney disease with stage 1 through stage 4 chronic kidney disease, or unspecified chronic kidney disease: Secondary | ICD-10-CM | POA: Diagnosis not present

## 2020-10-24 DIAGNOSIS — Z7901 Long term (current) use of anticoagulants: Secondary | ICD-10-CM | POA: Diagnosis not present

## 2020-10-24 DIAGNOSIS — Z9181 History of falling: Secondary | ICD-10-CM | POA: Diagnosis not present

## 2020-10-24 DIAGNOSIS — Z8673 Personal history of transient ischemic attack (TIA), and cerebral infarction without residual deficits: Secondary | ICD-10-CM | POA: Diagnosis not present

## 2020-10-24 DIAGNOSIS — E559 Vitamin D deficiency, unspecified: Secondary | ICD-10-CM | POA: Diagnosis not present

## 2020-11-02 DIAGNOSIS — M170 Bilateral primary osteoarthritis of knee: Secondary | ICD-10-CM | POA: Diagnosis not present

## 2020-11-02 DIAGNOSIS — I129 Hypertensive chronic kidney disease with stage 1 through stage 4 chronic kidney disease, or unspecified chronic kidney disease: Secondary | ICD-10-CM | POA: Diagnosis not present

## 2020-11-02 DIAGNOSIS — N183 Chronic kidney disease, stage 3 unspecified: Secondary | ICD-10-CM | POA: Diagnosis not present

## 2020-11-02 DIAGNOSIS — E785 Hyperlipidemia, unspecified: Secondary | ICD-10-CM | POA: Diagnosis not present

## 2020-11-02 DIAGNOSIS — G500 Trigeminal neuralgia: Secondary | ICD-10-CM | POA: Diagnosis not present

## 2020-11-02 DIAGNOSIS — M48061 Spinal stenosis, lumbar region without neurogenic claudication: Secondary | ICD-10-CM | POA: Diagnosis not present

## 2020-11-05 DIAGNOSIS — E785 Hyperlipidemia, unspecified: Secondary | ICD-10-CM | POA: Diagnosis not present

## 2020-11-05 DIAGNOSIS — G500 Trigeminal neuralgia: Secondary | ICD-10-CM | POA: Diagnosis not present

## 2020-11-05 DIAGNOSIS — I129 Hypertensive chronic kidney disease with stage 1 through stage 4 chronic kidney disease, or unspecified chronic kidney disease: Secondary | ICD-10-CM | POA: Diagnosis not present

## 2020-11-05 DIAGNOSIS — N183 Chronic kidney disease, stage 3 unspecified: Secondary | ICD-10-CM | POA: Diagnosis not present

## 2020-11-05 DIAGNOSIS — M170 Bilateral primary osteoarthritis of knee: Secondary | ICD-10-CM | POA: Diagnosis not present

## 2020-11-05 DIAGNOSIS — M48061 Spinal stenosis, lumbar region without neurogenic claudication: Secondary | ICD-10-CM | POA: Diagnosis not present

## 2020-11-09 ENCOUNTER — Telehealth: Payer: Self-pay

## 2020-11-09 DIAGNOSIS — I129 Hypertensive chronic kidney disease with stage 1 through stage 4 chronic kidney disease, or unspecified chronic kidney disease: Secondary | ICD-10-CM | POA: Diagnosis not present

## 2020-11-09 DIAGNOSIS — M170 Bilateral primary osteoarthritis of knee: Secondary | ICD-10-CM | POA: Diagnosis not present

## 2020-11-09 DIAGNOSIS — G500 Trigeminal neuralgia: Secondary | ICD-10-CM | POA: Diagnosis not present

## 2020-11-09 DIAGNOSIS — M48061 Spinal stenosis, lumbar region without neurogenic claudication: Secondary | ICD-10-CM | POA: Diagnosis not present

## 2020-11-09 DIAGNOSIS — E785 Hyperlipidemia, unspecified: Secondary | ICD-10-CM | POA: Diagnosis not present

## 2020-11-09 DIAGNOSIS — N183 Chronic kidney disease, stage 3 unspecified: Secondary | ICD-10-CM | POA: Diagnosis not present

## 2020-11-09 NOTE — Telephone Encounter (Signed)
Becky Leach [facility] PT calls nurse line to get verbal orders for [facility] PT as follows.   1 week 5  Verbal orders given per [facility] protocol.

## 2020-11-10 DIAGNOSIS — M170 Bilateral primary osteoarthritis of knee: Secondary | ICD-10-CM | POA: Diagnosis not present

## 2020-11-10 DIAGNOSIS — M48061 Spinal stenosis, lumbar region without neurogenic claudication: Secondary | ICD-10-CM | POA: Diagnosis not present

## 2020-11-10 DIAGNOSIS — N183 Chronic kidney disease, stage 3 unspecified: Secondary | ICD-10-CM | POA: Diagnosis not present

## 2020-11-10 DIAGNOSIS — G500 Trigeminal neuralgia: Secondary | ICD-10-CM | POA: Diagnosis not present

## 2020-11-10 DIAGNOSIS — E785 Hyperlipidemia, unspecified: Secondary | ICD-10-CM | POA: Diagnosis not present

## 2020-11-10 DIAGNOSIS — I129 Hypertensive chronic kidney disease with stage 1 through stage 4 chronic kidney disease, or unspecified chronic kidney disease: Secondary | ICD-10-CM | POA: Diagnosis not present

## 2020-11-16 DIAGNOSIS — G500 Trigeminal neuralgia: Secondary | ICD-10-CM | POA: Diagnosis not present

## 2020-11-16 DIAGNOSIS — N183 Chronic kidney disease, stage 3 unspecified: Secondary | ICD-10-CM | POA: Diagnosis not present

## 2020-11-16 DIAGNOSIS — E785 Hyperlipidemia, unspecified: Secondary | ICD-10-CM | POA: Diagnosis not present

## 2020-11-16 DIAGNOSIS — M170 Bilateral primary osteoarthritis of knee: Secondary | ICD-10-CM | POA: Diagnosis not present

## 2020-11-16 DIAGNOSIS — H401132 Primary open-angle glaucoma, bilateral, moderate stage: Secondary | ICD-10-CM | POA: Diagnosis not present

## 2020-11-16 DIAGNOSIS — I129 Hypertensive chronic kidney disease with stage 1 through stage 4 chronic kidney disease, or unspecified chronic kidney disease: Secondary | ICD-10-CM | POA: Diagnosis not present

## 2020-11-16 DIAGNOSIS — M48061 Spinal stenosis, lumbar region without neurogenic claudication: Secondary | ICD-10-CM | POA: Diagnosis not present

## 2020-11-18 DIAGNOSIS — G500 Trigeminal neuralgia: Secondary | ICD-10-CM | POA: Diagnosis not present

## 2020-11-18 DIAGNOSIS — E785 Hyperlipidemia, unspecified: Secondary | ICD-10-CM | POA: Diagnosis not present

## 2020-11-18 DIAGNOSIS — M48061 Spinal stenosis, lumbar region without neurogenic claudication: Secondary | ICD-10-CM | POA: Diagnosis not present

## 2020-11-18 DIAGNOSIS — I129 Hypertensive chronic kidney disease with stage 1 through stage 4 chronic kidney disease, or unspecified chronic kidney disease: Secondary | ICD-10-CM | POA: Diagnosis not present

## 2020-11-18 DIAGNOSIS — M170 Bilateral primary osteoarthritis of knee: Secondary | ICD-10-CM | POA: Diagnosis not present

## 2020-11-18 DIAGNOSIS — N183 Chronic kidney disease, stage 3 unspecified: Secondary | ICD-10-CM | POA: Diagnosis not present

## 2020-11-19 DIAGNOSIS — N311 Reflex neuropathic bladder, not elsewhere classified: Secondary | ICD-10-CM | POA: Diagnosis not present

## 2020-11-19 DIAGNOSIS — R351 Nocturia: Secondary | ICD-10-CM | POA: Diagnosis not present

## 2020-11-19 DIAGNOSIS — R350 Frequency of micturition: Secondary | ICD-10-CM | POA: Diagnosis not present

## 2020-11-23 ENCOUNTER — Telehealth: Payer: Self-pay | Admitting: Student in an Organized Health Care Education/Training Program

## 2020-11-23 DIAGNOSIS — R1310 Dysphagia, unspecified: Secondary | ICD-10-CM | POA: Diagnosis not present

## 2020-11-23 DIAGNOSIS — K219 Gastro-esophageal reflux disease without esophagitis: Secondary | ICD-10-CM | POA: Diagnosis not present

## 2020-11-23 DIAGNOSIS — G500 Trigeminal neuralgia: Secondary | ICD-10-CM | POA: Diagnosis not present

## 2020-11-23 DIAGNOSIS — Z8781 Personal history of (healed) traumatic fracture: Secondary | ICD-10-CM | POA: Diagnosis not present

## 2020-11-23 DIAGNOSIS — Z86711 Personal history of pulmonary embolism: Secondary | ICD-10-CM | POA: Diagnosis not present

## 2020-11-23 DIAGNOSIS — Z9181 History of falling: Secondary | ICD-10-CM | POA: Diagnosis not present

## 2020-11-23 DIAGNOSIS — Z7901 Long term (current) use of anticoagulants: Secondary | ICD-10-CM | POA: Diagnosis not present

## 2020-11-23 DIAGNOSIS — Z8711 Personal history of peptic ulcer disease: Secondary | ICD-10-CM | POA: Diagnosis not present

## 2020-11-23 DIAGNOSIS — Z8673 Personal history of transient ischemic attack (TIA), and cerebral infarction without residual deficits: Secondary | ICD-10-CM | POA: Diagnosis not present

## 2020-11-23 DIAGNOSIS — E785 Hyperlipidemia, unspecified: Secondary | ICD-10-CM | POA: Diagnosis not present

## 2020-11-23 DIAGNOSIS — N183 Chronic kidney disease, stage 3 unspecified: Secondary | ICD-10-CM | POA: Diagnosis not present

## 2020-11-23 DIAGNOSIS — M48061 Spinal stenosis, lumbar region without neurogenic claudication: Secondary | ICD-10-CM | POA: Diagnosis not present

## 2020-11-23 DIAGNOSIS — Z9089 Acquired absence of other organs: Secondary | ICD-10-CM | POA: Diagnosis not present

## 2020-11-23 DIAGNOSIS — E559 Vitamin D deficiency, unspecified: Secondary | ICD-10-CM | POA: Diagnosis not present

## 2020-11-23 DIAGNOSIS — I129 Hypertensive chronic kidney disease with stage 1 through stage 4 chronic kidney disease, or unspecified chronic kidney disease: Secondary | ICD-10-CM | POA: Diagnosis not present

## 2020-11-23 DIAGNOSIS — M170 Bilateral primary osteoarthritis of knee: Secondary | ICD-10-CM | POA: Diagnosis not present

## 2020-11-23 DIAGNOSIS — Z86718 Personal history of other venous thrombosis and embolism: Secondary | ICD-10-CM | POA: Diagnosis not present

## 2020-11-23 NOTE — Telephone Encounter (Signed)
Patient's son Becky Leach is calling and would like for Dr. Ouida Sills to call him to go over the lab results she had done in April. He said she has had a lot going on and he is not sure if this was discussed yet or not.   The best call back for Gaspar Bidding is [phone]

## 2020-11-25 DIAGNOSIS — N183 Chronic kidney disease, stage 3 unspecified: Secondary | ICD-10-CM | POA: Diagnosis not present

## 2020-11-25 DIAGNOSIS — I129 Hypertensive chronic kidney disease with stage 1 through stage 4 chronic kidney disease, or unspecified chronic kidney disease: Secondary | ICD-10-CM | POA: Diagnosis not present

## 2020-11-25 DIAGNOSIS — E785 Hyperlipidemia, unspecified: Secondary | ICD-10-CM | POA: Diagnosis not present

## 2020-11-25 DIAGNOSIS — M48061 Spinal stenosis, lumbar region without neurogenic claudication: Secondary | ICD-10-CM | POA: Diagnosis not present

## 2020-11-25 DIAGNOSIS — M170 Bilateral primary osteoarthritis of knee: Secondary | ICD-10-CM | POA: Diagnosis not present

## 2020-11-25 DIAGNOSIS — G500 Trigeminal neuralgia: Secondary | ICD-10-CM | POA: Diagnosis not present

## 2021-01-04 ENCOUNTER — Telehealth (INDEPENDENT_AMBULATORY_CARE_PROVIDER_SITE_OTHER): Payer: Self-pay

## 2021-01-04 NOTE — Telephone Encounter (Signed)
Spoke to pt son and he is not able to schedule at this time.

## 2021-02-08 ENCOUNTER — Other Ambulatory Visit: Payer: Self-pay | Admitting: Pulmonary Disease

## 2021-02-08 ENCOUNTER — Other Ambulatory Visit: Payer: Self-pay

## 2021-02-08 ENCOUNTER — Telehealth: Payer: Self-pay

## 2021-02-08 ENCOUNTER — Other Ambulatory Visit: Payer: Self-pay | Admitting: Student in an Organized Health Care Education/Training Program

## 2021-02-08 DIAGNOSIS — I8290 Acute embolism and thrombosis of unspecified vein: Secondary | ICD-10-CM

## 2021-02-08 DIAGNOSIS — Z86711 Personal history of pulmonary embolism: Secondary | ICD-10-CM

## 2021-02-08 MED ORDER — ATORVASTATIN CALCIUM 40 MG PO TABS
40.0000 mg | ORAL_TABLET | Freq: Every day | ORAL | 1 refills | Status: DC
Start: 2021-02-08 — End: 2021-07-08

## 2021-02-08 NOTE — Telephone Encounter (Signed)
Pt son called, pt has been schedule

## 2021-02-08 NOTE — Telephone Encounter (Signed)
Patient spoke with Becky Leach and scheduled office visit for 10/21 at 0950 with Dr. Thompson Grayer.   Talbot Grumbling, RN

## 2021-02-13 DIAGNOSIS — Z23 Encounter for immunization: Secondary | ICD-10-CM | POA: Diagnosis not present

## 2021-03-19 ENCOUNTER — Ambulatory Visit (INDEPENDENT_AMBULATORY_CARE_PROVIDER_SITE_OTHER): Payer: Medicare Other | Admitting: Family Medicine

## 2021-03-19 ENCOUNTER — Ambulatory Visit: Payer: Medicare Other | Admitting: Family Medicine

## 2021-03-19 ENCOUNTER — Other Ambulatory Visit: Payer: Self-pay

## 2021-03-19 ENCOUNTER — Encounter: Payer: Self-pay | Admitting: Family Medicine

## 2021-03-19 VITALS — BP 118/78 | HR 107 | Ht 65.0 in | Wt 106.8 lb

## 2021-03-19 DIAGNOSIS — Z86711 Personal history of pulmonary embolism: Secondary | ICD-10-CM | POA: Diagnosis not present

## 2021-03-19 DIAGNOSIS — I10 Essential (primary) hypertension: Secondary | ICD-10-CM | POA: Diagnosis not present

## 2021-03-19 DIAGNOSIS — R634 Abnormal weight loss: Secondary | ICD-10-CM | POA: Insufficient documentation

## 2021-03-19 DIAGNOSIS — N3281 Overactive bladder: Secondary | ICD-10-CM | POA: Diagnosis not present

## 2021-03-19 DIAGNOSIS — N1831 Chronic kidney disease, stage 3a: Secondary | ICD-10-CM

## 2021-03-19 HISTORY — DX: Abnormal weight loss: R63.4

## 2021-03-19 NOTE — Patient Instructions (Addendum)
It was wonderful to see you today.  Please bring ALL of your medications with you to every visit.   Today we talked about:  - Checking lab work, we have updated your vaccines, will call you if results abnormal - You are otherwise up to date on your medical screenings!    Thank you for choosing [facility].   Please call [phone] with any questions about today's appointment.  Please be sure to schedule follow up at the front  desk before you leave today.   Yehuda Savannah, MD  Family Medicine

## 2021-03-19 NOTE — Assessment & Plan Note (Signed)
-   Off of medications, reviewed blood pressure within normal limits today, will get BMP to assess CKD

## 2021-03-19 NOTE — Assessment & Plan Note (Signed)
On Eliquis, denies any signs or symptoms of bleeding, will get CBC to monitor

## 2021-03-19 NOTE — Assessment & Plan Note (Signed)
-   Stable, asymptomatic no edema, will get BMP to assess creatinine

## 2021-03-19 NOTE — Progress Notes (Signed)
    SUBJECTIVE:   CHIEF COMPLAINT / HPI:   Ms. Becky Leach presents for annual wellness exam, she notes no acute complaints.  She denies any pain, blood in stool, dark black stools, shortness of breath, chest pain.  She does note that she has lost a few pounds but denies any trouble swallowing, night sweats, pain that wakes her up at night, choking, dysphagia, odynophagia.  Her son thinks it was just after her stroke and she notes that she does not have a huge appetite.  No signs or symptoms of depression.  Overactive bladder-she follows with urology Dr. Claudia Desanctis, they have been adjusting medications last saw her in June.  Feels it is better somewhat but not perfect yet.  PERTINENT  PMH / PSH: TIA, PE, hypertension, CKD  OBJECTIVE:   BP 118/78   Pulse (!) 107   Ht 5\' 5"  (1.651 m)   Wt 106 lb 12.8 oz (48.4 kg)   SpO2 96%   BMI 17.77 kg/m   General: A&O, NAD HEENT: No sign of trauma, EOM grossly intact Cardiac: RRR, no m/r/g Respiratory: CTAB, normal WOB, no w/c/r GI: Soft, NTTP, non-distended  Extremities: NTTP, no peripheral edema. Neuro: In wheelchair, moves all 4 extremities appropriately  psych: Appropriate mood and affect   ASSESSMENT/PLAN:   Benign essential HTN - Off of medications, reviewed blood pressure within normal limits today, will get BMP to assess CKD  CKD (chronic kidney disease) stage 3, GFR 30-59 ml/min (HCC) - Stable, asymptomatic no edema, will get BMP to assess creatinine  History of pulmonary embolus (PE) On Eliquis, denies any signs or symptoms of bleeding, will get CBC to monitor  Overactive bladder - Following with urology continue home medications  Weight loss - 4 pound since last visit, has recovered from TIA and now taking food that no longer needs to be thickened, no red flag signs or symptoms we will continue to monitor encourage p.o. intake   She already had her flu and COVID vaccines, updated in chart. Discussed DEXA scanning and with shared  decision making she and her son elected to not receive DEXA scan at this time.  Lenoria Chime, MD [place]

## 2021-03-19 NOTE — Assessment & Plan Note (Signed)
-   Following with urology continue home medications

## 2021-03-19 NOTE — Assessment & Plan Note (Signed)
-   4 pound since last visit, has recovered from TIA and now taking food that no longer needs to be thickened, no red flag signs or symptoms we will continue to monitor encourage p.o. intake

## 2021-03-20 LAB — BASIC METABOLIC PANEL
BUN/Creatinine Ratio: 13 (ref 12–28)
BUN: 16 mg/dL (ref 8–27)
CO2: 23 mmol/L (ref 20–29)
Calcium: 10.2 mg/dL (ref 8.7–10.3)
Chloride: 106 mmol/L (ref 96–106)
Creatinine, Ser: 1.22 mg/dL — ABNORMAL HIGH (ref 0.57–1.00)
Glucose: 103 mg/dL — ABNORMAL HIGH (ref 70–99)
Potassium: 4.9 mmol/L (ref 3.5–5.2)
Sodium: 144 mmol/L (ref 134–144)
eGFR: 42 mL/min/{1.73_m2} — ABNORMAL LOW (ref >59)

## 2021-03-20 LAB — CBC
Hematocrit: 42.3 % (ref 34.0–46.6)
Hemoglobin: 14.6 g/dL (ref 11.1–15.9)
MCH: 29.7 pg (ref 26.6–33.0)
MCHC: 34.5 g/dL (ref 31.5–35.7)
MCV: 86 fL (ref 79–97)
Platelets: 181 10*3/uL (ref 150–450)
RBC: 4.91 x10E6/uL (ref 3.77–5.28)
RDW: 13.2 % (ref 11.7–15.4)
WBC: 4.6 10*3/uL (ref 3.4–10.8)

## 2021-03-29 ENCOUNTER — Telehealth: Payer: Self-pay | Admitting: *Deleted

## 2021-03-29 NOTE — Telephone Encounter (Signed)
Son calls to find out if his mom needs prevnar 24.  Per up to date guidelines, it does not seem that she needs one because she has had a 4 and a 23 after the age of 71.  Son would like for the PCP to verify that, and a call back with decision.  To PCP. Christen Bame, CMA

## 2021-03-30 ENCOUNTER — Telehealth: Payer: Self-pay | Admitting: Student

## 2021-03-30 NOTE — Telephone Encounter (Signed)
Spoke with patients son regarding pneumococcal vaccines. Confirmed that she is UTD as she has previously received both PCV-23 and PCV-13.

## 2021-04-01 NOTE — Telephone Encounter (Signed)
March 30, 2021 Precious Gilding, DO     12:23 PM Note Spoke with patients son regarding pneumococcal vaccines. Confirmed that she is UTD as she has previously received both PCV-23 and PCV-13.

## 2021-05-18 DIAGNOSIS — I739 Peripheral vascular disease, unspecified: Secondary | ICD-10-CM | POA: Diagnosis not present

## 2021-05-18 DIAGNOSIS — L603 Nail dystrophy: Secondary | ICD-10-CM | POA: Diagnosis not present

## 2021-05-18 DIAGNOSIS — L84 Corns and callosities: Secondary | ICD-10-CM | POA: Diagnosis not present

## 2021-07-07 ENCOUNTER — Other Ambulatory Visit: Payer: Self-pay | Admitting: Student

## 2021-08-16 ENCOUNTER — Other Ambulatory Visit: Payer: Self-pay | Admitting: Pulmonary Disease

## 2021-08-16 DIAGNOSIS — I8290 Acute embolism and thrombosis of unspecified vein: Secondary | ICD-10-CM

## 2021-08-16 DIAGNOSIS — Z86711 Personal history of pulmonary embolism: Secondary | ICD-10-CM

## 2021-11-02 ENCOUNTER — Encounter: Payer: Self-pay | Admitting: *Deleted

## 2021-11-17 DIAGNOSIS — H34831 Tributary (branch) retinal vein occlusion, right eye, with macular edema: Secondary | ICD-10-CM | POA: Diagnosis not present

## 2021-11-17 DIAGNOSIS — H401132 Primary open-angle glaucoma, bilateral, moderate stage: Secondary | ICD-10-CM | POA: Diagnosis not present

## 2021-12-07 ENCOUNTER — Other Ambulatory Visit: Payer: Self-pay | Admitting: Pulmonary Disease

## 2021-12-07 DIAGNOSIS — Z86711 Personal history of pulmonary embolism: Secondary | ICD-10-CM

## 2021-12-07 DIAGNOSIS — I8290 Acute embolism and thrombosis of unspecified vein: Secondary | ICD-10-CM

## 2022-01-14 ENCOUNTER — Other Ambulatory Visit: Payer: Self-pay | Admitting: Student

## 2022-01-19 DIAGNOSIS — N311 Reflex neuropathic bladder, not elsewhere classified: Secondary | ICD-10-CM | POA: Diagnosis not present

## 2022-01-19 DIAGNOSIS — R351 Nocturia: Secondary | ICD-10-CM | POA: Diagnosis not present

## 2022-02-07 DIAGNOSIS — H401134 Primary open-angle glaucoma, bilateral, indeterminate stage: Secondary | ICD-10-CM | POA: Diagnosis not present

## 2022-02-07 DIAGNOSIS — H34831 Tributary (branch) retinal vein occlusion, right eye, with macular edema: Secondary | ICD-10-CM | POA: Diagnosis not present

## 2022-02-07 DIAGNOSIS — H35372 Puckering of macula, left eye: Secondary | ICD-10-CM | POA: Diagnosis not present

## 2022-02-07 DIAGNOSIS — H43813 Vitreous degeneration, bilateral: Secondary | ICD-10-CM | POA: Diagnosis not present

## 2022-02-22 DIAGNOSIS — H34831 Tributary (branch) retinal vein occlusion, right eye, with macular edema: Secondary | ICD-10-CM | POA: Diagnosis not present

## 2022-03-07 DIAGNOSIS — Z23 Encounter for immunization: Secondary | ICD-10-CM | POA: Diagnosis not present

## 2022-03-16 ENCOUNTER — Encounter: Payer: Self-pay | Admitting: Pulmonary Disease

## 2022-03-16 ENCOUNTER — Ambulatory Visit (INDEPENDENT_AMBULATORY_CARE_PROVIDER_SITE_OTHER): Payer: Medicare Other | Admitting: Pulmonary Disease

## 2022-03-16 VITALS — BP 114/76 | HR 100 | Temp 98.7°F | Ht 65.0 in | Wt 111.8 lb

## 2022-03-16 DIAGNOSIS — I8290 Acute embolism and thrombosis of unspecified vein: Secondary | ICD-10-CM

## 2022-03-16 DIAGNOSIS — Z86711 Personal history of pulmonary embolism: Secondary | ICD-10-CM

## 2022-03-16 NOTE — Progress Notes (Signed)
Becky Leach    [id number]    1929/10/08  Primary Care Physician:Jones, Judson Roch, DO  Referring Physician: Precious Gilding, DO [address] [place],  [place] [address]  Chief complaint: Follow-up for pulmonary embolism.  HPI: 88 y.o.  with past history of hypertension.  Admitted in May 11, 2017 with tachycardia, dizziness.  She had a CTA which showed submassive pulmonary embolism.  There are no precipitating factors for pulmonary embolism.  She is never suffered from DVT or pulmonary embolism before.  Although she has osteoarthritis she is still able to ambulate and is not bedbound.  She has had no recent prolonged travel she has had no recent surgeries other than an injection of her right knee.  She does not smoke she is not obese and has no known history of cancer. Pertinent to her risk for anticoagulation she did have peptic ulcer disease in 2013 due to NSAID use which has not recurred.  She has never had any unusual bleeding, she has not had recent surgery on her eyes brain or back.  She was anticoagulated with heparin and discharged on Eliquis.   She had a fall  with left humerus fracture requiring ORIF 09/12/2017 Had an episode of epistaxis in January 2021 which was packed by ENT.  Eliquis dose changed to prophylaxis dose at 2.5 mg twice daily  Pets: None Occupation: Retired Secretary/administrator Exposures: No known exposures Smoking history: Never smoker Travel History: Not significant  Interim history: Continues on Eliquis 2.5 mg twice daily.  No more issues with bleeding. Recently started on Avastin for his retinal branch occlusion  Outpatient Encounter Medications as of 03/16/2022  Medication Sig   apixaban (ELIQUIS) 2.5 MG TABS tablet TAKE 1 TABLET BY MOUTH TWICE A DAY   atorvastatin (LIPITOR) 40 MG tablet TAKE 1 TABLET BY MOUTH EVERY DAY   Cholecalciferol (VITAMIN D-3) 25 MCG (1000 UT) CAPS Take 1,000 Units by mouth daily.   famotidine (PEPCID) 20 MG tablet Take 20 mg by  mouth in the morning.   latanoprost (XALATAN) 0.005 % ophthalmic solution Place 1 drop into both eyes at bedtime.   loratadine (CLARITIN) 10 MG tablet Take 1 tablet (10 mg total) by mouth daily. (Patient taking differently: Take 10 mg by mouth in the morning.)   MYRBETRIQ 50 MG TB24 tablet Take 50 mg by mouth in the morning.   Trolamine Salicylate (ASPERCREME EX) Apply 1 application topically as needed (for pain).   trospium (SANCTURA) 20 MG tablet Take 20 mg by mouth at bedtime.   [employer] D3 2000 units CAPS TAKE 1 TABLET (2,000 UNITS TOTAL) BY MOUTH 2 (TWO) TIMES DAILY. (Patient not taking: Reported on 08/22/2020)   No facility-administered encounter medications on file as of 03/16/2022.    Physical Exam: Blood pressure 114/76, pulse 100, temperature 98.7 F (37.1 C), temperature source Oral, height '5\' 5"'$  (1.651 m), weight 111 lb 12.8 oz (50.7 kg), SpO2 95 %. Gen:      No acute distress HEENT:  EOMI, sclera anicteric Neck:     No masses; no thyromegaly Lungs:    Clear to auscultation bilaterally; normal respiratory effort CV:         Regular rate and rhythm; no murmurs Abd:      + bowel sounds; soft, non-tender; no palpable masses, no distension Ext:    No edema; adequate peripheral perfusion Skin:      Warm and dry; no rash Neuro: alert and oriented x 3 Psych:  normal mood and affect   Data Reviewed: Echocardiogram 05/11/17-LVEF 60-65%, high ventricular filling pressure, Right ventricle is mildly dilated.  Peak PA pressure 52.  Moderate pulmonary hypertension.  CT angiogram 05/11/17- extensive bilateral pulmonary embolism.  RV/LV ratio 1.6.  I have reviewed the images personally.  Lower extremity duplex 05/12/17-DVT in the right femoral and common femoral vein.  Assessment/Plan: Follow-up for submassive pulmonary embolism, right DVT She continues on Eliquis at a lower dose without any issue Would recommend indefinite anticoagulation for unprovoked venous thromboembolism.    Noted to  have peptic ulcer in 2013 secondary to NSAID use Epistaxis in January 2021 with no recurrence Continue monitoring for any recurrence of bleed  Follow-up in 1 year.  Marshell Garfinkel MD [facility] 03/16/2022, 3:11 PM  CC: Precious Gilding, DO

## 2022-03-16 NOTE — Patient Instructions (Signed)
Continue the Eliquis anticoagulation Return in 1 year.

## 2022-03-22 DIAGNOSIS — H43813 Vitreous degeneration, bilateral: Secondary | ICD-10-CM | POA: Diagnosis not present

## 2022-03-22 DIAGNOSIS — H34831 Tributary (branch) retinal vein occlusion, right eye, with macular edema: Secondary | ICD-10-CM | POA: Diagnosis not present

## 2022-04-25 DIAGNOSIS — H34831 Tributary (branch) retinal vein occlusion, right eye, with macular edema: Secondary | ICD-10-CM | POA: Diagnosis not present

## 2022-04-25 DIAGNOSIS — H35372 Puckering of macula, left eye: Secondary | ICD-10-CM | POA: Diagnosis not present

## 2022-04-30 ENCOUNTER — Other Ambulatory Visit: Payer: Self-pay | Admitting: Pulmonary Disease

## 2022-04-30 DIAGNOSIS — Z86711 Personal history of pulmonary embolism: Secondary | ICD-10-CM

## 2022-04-30 DIAGNOSIS — I8290 Acute embolism and thrombosis of unspecified vein: Secondary | ICD-10-CM

## 2022-06-06 ENCOUNTER — Other Ambulatory Visit: Payer: Self-pay | Admitting: Student

## 2022-06-21 DIAGNOSIS — H43813 Vitreous degeneration, bilateral: Secondary | ICD-10-CM | POA: Diagnosis not present

## 2022-06-21 DIAGNOSIS — H401132 Primary open-angle glaucoma, bilateral, moderate stage: Secondary | ICD-10-CM | POA: Diagnosis not present

## 2022-06-21 DIAGNOSIS — H35372 Puckering of macula, left eye: Secondary | ICD-10-CM | POA: Diagnosis not present

## 2022-06-21 DIAGNOSIS — H34831 Tributary (branch) retinal vein occlusion, right eye, with macular edema: Secondary | ICD-10-CM | POA: Diagnosis not present

## 2022-06-21 DIAGNOSIS — H35033 Hypertensive retinopathy, bilateral: Secondary | ICD-10-CM | POA: Diagnosis not present

## 2022-08-02 DIAGNOSIS — H34831 Tributary (branch) retinal vein occlusion, right eye, with macular edema: Secondary | ICD-10-CM | POA: Diagnosis not present

## 2022-09-03 ENCOUNTER — Other Ambulatory Visit: Payer: Self-pay | Admitting: Pulmonary Disease

## 2022-09-03 DIAGNOSIS — Z86711 Personal history of pulmonary embolism: Secondary | ICD-10-CM

## 2022-09-03 DIAGNOSIS — I8290 Acute embolism and thrombosis of unspecified vein: Secondary | ICD-10-CM

## 2022-09-27 DIAGNOSIS — H34831 Tributary (branch) retinal vein occlusion, right eye, with macular edema: Secondary | ICD-10-CM | POA: Diagnosis not present

## 2022-09-27 DIAGNOSIS — H35372 Puckering of macula, left eye: Secondary | ICD-10-CM | POA: Diagnosis not present

## 2022-09-27 DIAGNOSIS — H35033 Hypertensive retinopathy, bilateral: Secondary | ICD-10-CM | POA: Diagnosis not present

## 2022-09-27 DIAGNOSIS — H43813 Vitreous degeneration, bilateral: Secondary | ICD-10-CM | POA: Diagnosis not present

## 2022-09-27 DIAGNOSIS — H401132 Primary open-angle glaucoma, bilateral, moderate stage: Secondary | ICD-10-CM | POA: Diagnosis not present

## 2022-10-17 ENCOUNTER — Telehealth: Payer: Self-pay | Admitting: *Deleted

## 2022-10-17 NOTE — Patient Outreach (Signed)
  Care Coordination   Initial Visit Note   10/17/2022 Name: Becky Leach MRN: [id number] DOB: 1929/09/03  Becky Leach is a 89 y.o. year old female who sees Erick Alley, DO for primary care. I  spoke with pt's son by phone.  What matters to the patients health and wellness today?  CSW advised son of the Care Coordination program services and support and indicates not interested at this time and will get back with [place] if needs/interest changes.     Goals Addressed   None     SDOH assessments and interventions completed:  No     Care Coordination Interventions:  No, not indicated   Follow up plan: No further intervention required.   Encounter Outcome:  Pt. Refused

## 2022-11-15 DIAGNOSIS — H401132 Primary open-angle glaucoma, bilateral, moderate stage: Secondary | ICD-10-CM | POA: Diagnosis not present

## 2022-11-22 DIAGNOSIS — H34831 Tributary (branch) retinal vein occlusion, right eye, with macular edema: Secondary | ICD-10-CM | POA: Diagnosis not present

## 2022-12-06 ENCOUNTER — Other Ambulatory Visit: Payer: Self-pay | Admitting: Student

## 2022-12-07 ENCOUNTER — Telehealth: Payer: Self-pay

## 2022-12-07 NOTE — Telephone Encounter (Signed)
Pt son calling to ask for a referral for Home Health. Needs help with meals, bathing etc. Please call son at [phone] to discuss.  Sunday Spillers, CMA

## 2022-12-09 NOTE — Telephone Encounter (Signed)
Called and verified patient's son [name]) who is POA for patient. He expressed that he was requesting Home health for his mom to assist with ADL such as bathing, meals, and medication management given her advanced age. Informed [name] patient most likely need PCS and not HH as she currently doesn't need wound care or PT. Advise [name] patient would need to be seen in clinic for evaluation of her needs as documentation could be required as proof patient has been evaluated for this. [name] reported that Dr. Yetta Barre doesn't have any availability until September and they are hoping to get the services in place before then. I clarified patient can be seen by other provides in the clinic if PCP availability is to far out. [name] verbalized understanding and agreeable to plan. He will call to schedule an earlier appointment for his mom, [name] to determine if she needs PCS or HH.

## 2023-01-12 ENCOUNTER — Ambulatory Visit (INDEPENDENT_AMBULATORY_CARE_PROVIDER_SITE_OTHER): Payer: Medicare Other | Admitting: Student

## 2023-01-12 ENCOUNTER — Encounter: Payer: Self-pay | Admitting: Student

## 2023-01-12 ENCOUNTER — Other Ambulatory Visit: Payer: Self-pay

## 2023-01-12 VITALS — BP 133/78 | HR 99

## 2023-01-12 DIAGNOSIS — Z9189 Other specified personal risk factors, not elsewhere classified: Secondary | ICD-10-CM

## 2023-01-12 DIAGNOSIS — M48061 Spinal stenosis, lumbar region without neurogenic claudication: Secondary | ICD-10-CM

## 2023-01-12 DIAGNOSIS — N183 Chronic kidney disease, stage 3 unspecified: Secondary | ICD-10-CM | POA: Diagnosis not present

## 2023-01-12 DIAGNOSIS — R262 Difficulty in walking, not elsewhere classified: Secondary | ICD-10-CM

## 2023-01-12 DIAGNOSIS — R296 Repeated falls: Secondary | ICD-10-CM | POA: Diagnosis not present

## 2023-01-12 DIAGNOSIS — Z86711 Personal history of pulmonary embolism: Secondary | ICD-10-CM

## 2023-01-12 DIAGNOSIS — M170 Bilateral primary osteoarthritis of knee: Secondary | ICD-10-CM | POA: Diagnosis not present

## 2023-01-12 NOTE — Progress Notes (Signed)
    SUBJECTIVE:   CHIEF COMPLAINT / HPI:   Goals of Care Has 4 sons -she is accompanied by son Magdalyn Ipina today. A different son lives with her. His health is deteriorating and he can no longer provide assistance to pt which he use to provide.  She needs help preparing meals, dressing, bathing, taking medications, cleaning, using bedside commode, and doing laundry d/t limited mobility in setting of geriatric age and osteoarthritis.   She is on chronic anticoagulation in setting of history of PE.  Son and patient are very aware of risk of bleeding which is concerning as patient is high fall risk.  They try to mitigate this risk with continued exercise on floor bicycle she can use while sitting in chair to keep her conditioned. Uses a walker but sometimes struggles. Used to get [facility] PT which was helpful.  Knee OA Pt complains of chronic knee pain, worse in R knee over past 2 weeks and likely d/t OA per patients son. Takes 1/2 tablet of tylenol daily as needed.   PERTINENT  PMH / PSH: HTN, Frequent falls, on long term AC d/t hx PE, CKD    OBJECTIVE:   BP 133/78   Pulse 99   SpO2 100%    General: NAD, pleasant, able to participate in exam Cardiac: RRR, no murmurs. Respiratory: CTAB, normal effort, No wheezes, rales or rhonchi Extremities: no erythema, edema, or warmth of right knee.  No TTP.  No pain with flexion or extension of right knee. Skin: warm and dry, no rashes noted Neuro: alert, no obvious focal deficits Psych: Normal affect and mood  ASSESSMENT/PLAN:   At risk for hemorrhage associated with anticoagulation therapy Discussed today.  Patient and son would like to continue with anticoagulant.   -CBC and CMP today -Patient would benefit from [facility] PT to help mitigate fall risk, ordered   CKD (chronic kidney disease) stage 3, GFR 30-59 ml/min (HCC) Will monitor with CMP today.   Bilateral primary osteoarthritis of knee Knee pain likely associated with 9 okay.  Physical exam  nonconcerning. -Tylenol to up to 1000 mg every 8 hours as needed -Voltaren gel times daily -Would benefit from home health PT - ordered   Impaired ambulation In setting of geriatric age, spinal stenosis, and OA.  Patient needs assistance with ADLs including taking medications, dressing, bathing, cleaning, using bedside commode, doing laundry. -Order placed for [facility]    Health maintenance Will help set up Medicare AWV  Dr. Erick Alley, DO [facility]

## 2023-01-12 NOTE — Patient Instructions (Addendum)
It was great to see you! Thank you for allowing me to participate in your care!  I recommend that you always bring your medications to each appointment as this makes it easy to ensure you are on the correct medications and helps [place] not miss when refills are needed.  Our plans for today:  - I will place order for home health  - for knee pain, you can take tylenol 1000 mg every 8 hours and use Voltaren gel (diclofenac) up to 4 times a day  -Will call or send letter with results if no concern  Take care and seek immediate care sooner if you develop any concerns.   Dr. Erick Alley, DO [facility]

## 2023-01-13 LAB — COMPREHENSIVE METABOLIC PANEL
ALT: 20 IU/L (ref 0–32)
AST: 23 IU/L (ref 0–40)
Albumin: 4.2 g/dL (ref 3.6–4.6)
Alkaline Phosphatase: 75 IU/L (ref 44–121)
BUN/Creatinine Ratio: 15 (ref 12–28)
BUN: 21 mg/dL (ref 10–36)
Bilirubin Total: 0.8 mg/dL (ref 0.0–1.2)
CO2: 25 mmol/L (ref 20–29)
Calcium: 10.5 mg/dL — ABNORMAL HIGH (ref 8.7–10.3)
Chloride: 106 mmol/L (ref 96–106)
Creatinine, Ser: 1.41 mg/dL — ABNORMAL HIGH (ref 0.57–1.00)
Globulin, Total: 2.6 g/dL (ref 1.5–4.5)
Glucose: 89 mg/dL (ref 70–99)
Potassium: 4.7 mmol/L (ref 3.5–5.2)
Sodium: 145 mmol/L — ABNORMAL HIGH (ref 134–144)
Total Protein: 6.8 g/dL (ref 6.0–8.5)
eGFR: 35 mL/min/{1.73_m2} — ABNORMAL LOW (ref >59)

## 2023-01-13 LAB — CBC
Hematocrit: 45 % (ref 34.0–46.6)
Hemoglobin: 15.3 g/dL (ref 11.1–15.9)
MCH: 30 pg (ref 26.6–33.0)
MCHC: 34 g/dL (ref 31.5–35.7)
MCV: 88 fL (ref 79–97)
Platelets: 223 10*3/uL (ref 150–450)
RBC: 5.1 x10E6/uL (ref 3.77–5.28)
RDW: 13.7 % (ref 11.7–15.4)
WBC: 4.9 10*3/uL (ref 3.4–10.8)

## 2023-01-13 NOTE — Assessment & Plan Note (Signed)
Knee pain likely associated with 9 okay.  Physical exam nonconcerning. -Tylenol to up to 1000 mg every 8 hours as needed -Voltaren gel times daily -Would benefit from home health PT - ordered

## 2023-01-13 NOTE — Assessment & Plan Note (Signed)
Discussed today.  Patient and son would like to continue with anticoagulant.   -CBC and CMP today -Patient would benefit from [facility] PT to help mitigate fall risk, ordered

## 2023-01-13 NOTE — Assessment & Plan Note (Addendum)
In setting of geriatric age, spinal stenosis, and OA.  Patient needs assistance with ADLs including taking medications, dressing, bathing, cleaning, using bedside commode, doing laundry. -Order placed for [facility].  Son [name] is point of contact for patient

## 2023-01-13 NOTE — Assessment & Plan Note (Signed)
Will monitor with CMP today.

## 2023-01-16 ENCOUNTER — Encounter: Payer: Self-pay | Admitting: Student

## 2023-01-16 ENCOUNTER — Telehealth: Payer: Self-pay | Admitting: Student

## 2023-01-16 NOTE — Telephone Encounter (Signed)
Spoke with patients son and POA, [name], about labs which show worsening kidney function and slightly elevated calcium. Shared decision making used and son prefers to return in 3-6 months for repeat labs. Can also consider urine microalbumin creatinine ratio.

## 2023-01-18 DIAGNOSIS — I129 Hypertensive chronic kidney disease with stage 1 through stage 4 chronic kidney disease, or unspecified chronic kidney disease: Secondary | ICD-10-CM | POA: Diagnosis not present

## 2023-01-18 DIAGNOSIS — M48061 Spinal stenosis, lumbar region without neurogenic claudication: Secondary | ICD-10-CM | POA: Diagnosis not present

## 2023-01-18 DIAGNOSIS — G8929 Other chronic pain: Secondary | ICD-10-CM | POA: Diagnosis not present

## 2023-01-18 DIAGNOSIS — Z7901 Long term (current) use of anticoagulants: Secondary | ICD-10-CM | POA: Diagnosis not present

## 2023-01-18 DIAGNOSIS — Z9181 History of falling: Secondary | ICD-10-CM | POA: Diagnosis not present

## 2023-01-18 DIAGNOSIS — N183 Chronic kidney disease, stage 3 unspecified: Secondary | ICD-10-CM | POA: Diagnosis not present

## 2023-01-18 DIAGNOSIS — M170 Bilateral primary osteoarthritis of knee: Secondary | ICD-10-CM | POA: Diagnosis not present

## 2023-01-18 DIAGNOSIS — Z86711 Personal history of pulmonary embolism: Secondary | ICD-10-CM | POA: Diagnosis not present

## 2023-01-20 DIAGNOSIS — G8929 Other chronic pain: Secondary | ICD-10-CM | POA: Diagnosis not present

## 2023-01-20 DIAGNOSIS — I129 Hypertensive chronic kidney disease with stage 1 through stage 4 chronic kidney disease, or unspecified chronic kidney disease: Secondary | ICD-10-CM | POA: Diagnosis not present

## 2023-01-20 DIAGNOSIS — Z7901 Long term (current) use of anticoagulants: Secondary | ICD-10-CM | POA: Diagnosis not present

## 2023-01-20 DIAGNOSIS — M170 Bilateral primary osteoarthritis of knee: Secondary | ICD-10-CM | POA: Diagnosis not present

## 2023-01-20 DIAGNOSIS — M48061 Spinal stenosis, lumbar region without neurogenic claudication: Secondary | ICD-10-CM | POA: Diagnosis not present

## 2023-01-20 DIAGNOSIS — N183 Chronic kidney disease, stage 3 unspecified: Secondary | ICD-10-CM | POA: Diagnosis not present

## 2023-01-24 ENCOUNTER — Telehealth: Payer: Self-pay

## 2023-01-24 DIAGNOSIS — G8929 Other chronic pain: Secondary | ICD-10-CM | POA: Diagnosis not present

## 2023-01-24 DIAGNOSIS — M170 Bilateral primary osteoarthritis of knee: Secondary | ICD-10-CM | POA: Diagnosis not present

## 2023-01-24 DIAGNOSIS — N183 Chronic kidney disease, stage 3 unspecified: Secondary | ICD-10-CM | POA: Diagnosis not present

## 2023-01-24 DIAGNOSIS — M48061 Spinal stenosis, lumbar region without neurogenic claudication: Secondary | ICD-10-CM | POA: Diagnosis not present

## 2023-01-24 DIAGNOSIS — I129 Hypertensive chronic kidney disease with stage 1 through stage 4 chronic kidney disease, or unspecified chronic kidney disease: Secondary | ICD-10-CM | POA: Diagnosis not present

## 2023-01-24 DIAGNOSIS — Z7901 Long term (current) use of anticoagulants: Secondary | ICD-10-CM | POA: Diagnosis not present

## 2023-01-24 NOTE — Telephone Encounter (Signed)
[facility] [facility] OT with [facility] calls nurse line requesting VO for [facility] OT as follows.   1x a week for 8 weeks.   Verbal order given.

## 2023-01-25 DIAGNOSIS — N311 Reflex neuropathic bladder, not elsewhere classified: Secondary | ICD-10-CM | POA: Diagnosis not present

## 2023-01-25 DIAGNOSIS — R351 Nocturia: Secondary | ICD-10-CM | POA: Diagnosis not present

## 2023-01-26 DIAGNOSIS — N183 Chronic kidney disease, stage 3 unspecified: Secondary | ICD-10-CM | POA: Diagnosis not present

## 2023-01-26 DIAGNOSIS — M170 Bilateral primary osteoarthritis of knee: Secondary | ICD-10-CM | POA: Diagnosis not present

## 2023-01-26 DIAGNOSIS — M48061 Spinal stenosis, lumbar region without neurogenic claudication: Secondary | ICD-10-CM | POA: Diagnosis not present

## 2023-01-26 DIAGNOSIS — I129 Hypertensive chronic kidney disease with stage 1 through stage 4 chronic kidney disease, or unspecified chronic kidney disease: Secondary | ICD-10-CM | POA: Diagnosis not present

## 2023-01-26 DIAGNOSIS — Z7901 Long term (current) use of anticoagulants: Secondary | ICD-10-CM | POA: Diagnosis not present

## 2023-01-26 DIAGNOSIS — G8929 Other chronic pain: Secondary | ICD-10-CM | POA: Diagnosis not present

## 2023-01-27 DIAGNOSIS — M48061 Spinal stenosis, lumbar region without neurogenic claudication: Secondary | ICD-10-CM | POA: Diagnosis not present

## 2023-01-27 DIAGNOSIS — G8929 Other chronic pain: Secondary | ICD-10-CM | POA: Diagnosis not present

## 2023-01-27 DIAGNOSIS — I129 Hypertensive chronic kidney disease with stage 1 through stage 4 chronic kidney disease, or unspecified chronic kidney disease: Secondary | ICD-10-CM | POA: Diagnosis not present

## 2023-01-27 DIAGNOSIS — M170 Bilateral primary osteoarthritis of knee: Secondary | ICD-10-CM | POA: Diagnosis not present

## 2023-01-27 DIAGNOSIS — N183 Chronic kidney disease, stage 3 unspecified: Secondary | ICD-10-CM | POA: Diagnosis not present

## 2023-01-27 DIAGNOSIS — Z7901 Long term (current) use of anticoagulants: Secondary | ICD-10-CM | POA: Diagnosis not present

## 2023-01-29 ENCOUNTER — Other Ambulatory Visit: Payer: Self-pay | Admitting: Pulmonary Disease

## 2023-01-29 DIAGNOSIS — I8290 Acute embolism and thrombosis of unspecified vein: Secondary | ICD-10-CM

## 2023-01-29 DIAGNOSIS — Z86711 Personal history of pulmonary embolism: Secondary | ICD-10-CM

## 2023-01-31 DIAGNOSIS — H35033 Hypertensive retinopathy, bilateral: Secondary | ICD-10-CM | POA: Diagnosis not present

## 2023-01-31 DIAGNOSIS — H35372 Puckering of macula, left eye: Secondary | ICD-10-CM | POA: Diagnosis not present

## 2023-01-31 DIAGNOSIS — H401132 Primary open-angle glaucoma, bilateral, moderate stage: Secondary | ICD-10-CM | POA: Diagnosis not present

## 2023-01-31 DIAGNOSIS — H43813 Vitreous degeneration, bilateral: Secondary | ICD-10-CM | POA: Diagnosis not present

## 2023-01-31 DIAGNOSIS — H34831 Tributary (branch) retinal vein occlusion, right eye, with macular edema: Secondary | ICD-10-CM | POA: Diagnosis not present

## 2023-02-02 DIAGNOSIS — G8929 Other chronic pain: Secondary | ICD-10-CM | POA: Diagnosis not present

## 2023-02-02 DIAGNOSIS — M48061 Spinal stenosis, lumbar region without neurogenic claudication: Secondary | ICD-10-CM | POA: Diagnosis not present

## 2023-02-02 DIAGNOSIS — I129 Hypertensive chronic kidney disease with stage 1 through stage 4 chronic kidney disease, or unspecified chronic kidney disease: Secondary | ICD-10-CM | POA: Diagnosis not present

## 2023-02-02 DIAGNOSIS — Z7901 Long term (current) use of anticoagulants: Secondary | ICD-10-CM | POA: Diagnosis not present

## 2023-02-02 DIAGNOSIS — M170 Bilateral primary osteoarthritis of knee: Secondary | ICD-10-CM | POA: Diagnosis not present

## 2023-02-02 DIAGNOSIS — N183 Chronic kidney disease, stage 3 unspecified: Secondary | ICD-10-CM | POA: Diagnosis not present

## 2023-02-03 DIAGNOSIS — G8929 Other chronic pain: Secondary | ICD-10-CM | POA: Diagnosis not present

## 2023-02-03 DIAGNOSIS — M48061 Spinal stenosis, lumbar region without neurogenic claudication: Secondary | ICD-10-CM | POA: Diagnosis not present

## 2023-02-03 DIAGNOSIS — N183 Chronic kidney disease, stage 3 unspecified: Secondary | ICD-10-CM | POA: Diagnosis not present

## 2023-02-03 DIAGNOSIS — Z7901 Long term (current) use of anticoagulants: Secondary | ICD-10-CM | POA: Diagnosis not present

## 2023-02-03 DIAGNOSIS — M170 Bilateral primary osteoarthritis of knee: Secondary | ICD-10-CM | POA: Diagnosis not present

## 2023-02-03 DIAGNOSIS — I129 Hypertensive chronic kidney disease with stage 1 through stage 4 chronic kidney disease, or unspecified chronic kidney disease: Secondary | ICD-10-CM | POA: Diagnosis not present

## 2023-02-07 DIAGNOSIS — I129 Hypertensive chronic kidney disease with stage 1 through stage 4 chronic kidney disease, or unspecified chronic kidney disease: Secondary | ICD-10-CM | POA: Diagnosis not present

## 2023-02-07 DIAGNOSIS — N183 Chronic kidney disease, stage 3 unspecified: Secondary | ICD-10-CM | POA: Diagnosis not present

## 2023-02-07 DIAGNOSIS — M48061 Spinal stenosis, lumbar region without neurogenic claudication: Secondary | ICD-10-CM | POA: Diagnosis not present

## 2023-02-07 DIAGNOSIS — G8929 Other chronic pain: Secondary | ICD-10-CM | POA: Diagnosis not present

## 2023-02-07 DIAGNOSIS — M170 Bilateral primary osteoarthritis of knee: Secondary | ICD-10-CM | POA: Diagnosis not present

## 2023-02-07 DIAGNOSIS — Z7901 Long term (current) use of anticoagulants: Secondary | ICD-10-CM | POA: Diagnosis not present

## 2023-02-09 DIAGNOSIS — G8929 Other chronic pain: Secondary | ICD-10-CM | POA: Diagnosis not present

## 2023-02-09 DIAGNOSIS — M48061 Spinal stenosis, lumbar region without neurogenic claudication: Secondary | ICD-10-CM | POA: Diagnosis not present

## 2023-02-09 DIAGNOSIS — I129 Hypertensive chronic kidney disease with stage 1 through stage 4 chronic kidney disease, or unspecified chronic kidney disease: Secondary | ICD-10-CM | POA: Diagnosis not present

## 2023-02-09 DIAGNOSIS — Z7901 Long term (current) use of anticoagulants: Secondary | ICD-10-CM | POA: Diagnosis not present

## 2023-02-09 DIAGNOSIS — M170 Bilateral primary osteoarthritis of knee: Secondary | ICD-10-CM | POA: Diagnosis not present

## 2023-02-09 DIAGNOSIS — N183 Chronic kidney disease, stage 3 unspecified: Secondary | ICD-10-CM | POA: Diagnosis not present

## 2023-02-10 DIAGNOSIS — I129 Hypertensive chronic kidney disease with stage 1 through stage 4 chronic kidney disease, or unspecified chronic kidney disease: Secondary | ICD-10-CM | POA: Diagnosis not present

## 2023-02-10 DIAGNOSIS — G8929 Other chronic pain: Secondary | ICD-10-CM | POA: Diagnosis not present

## 2023-02-10 DIAGNOSIS — M170 Bilateral primary osteoarthritis of knee: Secondary | ICD-10-CM | POA: Diagnosis not present

## 2023-02-10 DIAGNOSIS — N183 Chronic kidney disease, stage 3 unspecified: Secondary | ICD-10-CM | POA: Diagnosis not present

## 2023-02-10 DIAGNOSIS — M48061 Spinal stenosis, lumbar region without neurogenic claudication: Secondary | ICD-10-CM | POA: Diagnosis not present

## 2023-02-10 DIAGNOSIS — Z7901 Long term (current) use of anticoagulants: Secondary | ICD-10-CM | POA: Diagnosis not present

## 2023-02-13 DIAGNOSIS — Z7901 Long term (current) use of anticoagulants: Secondary | ICD-10-CM | POA: Diagnosis not present

## 2023-02-13 DIAGNOSIS — M170 Bilateral primary osteoarthritis of knee: Secondary | ICD-10-CM | POA: Diagnosis not present

## 2023-02-13 DIAGNOSIS — N183 Chronic kidney disease, stage 3 unspecified: Secondary | ICD-10-CM | POA: Diagnosis not present

## 2023-02-13 DIAGNOSIS — G8929 Other chronic pain: Secondary | ICD-10-CM | POA: Diagnosis not present

## 2023-02-13 DIAGNOSIS — M48061 Spinal stenosis, lumbar region without neurogenic claudication: Secondary | ICD-10-CM | POA: Diagnosis not present

## 2023-02-13 DIAGNOSIS — I129 Hypertensive chronic kidney disease with stage 1 through stage 4 chronic kidney disease, or unspecified chronic kidney disease: Secondary | ICD-10-CM | POA: Diagnosis not present

## 2023-02-14 ENCOUNTER — Ambulatory Visit: Payer: Medicare Other | Admitting: Student

## 2023-02-14 DIAGNOSIS — I129 Hypertensive chronic kidney disease with stage 1 through stage 4 chronic kidney disease, or unspecified chronic kidney disease: Secondary | ICD-10-CM | POA: Diagnosis not present

## 2023-02-14 DIAGNOSIS — Z7901 Long term (current) use of anticoagulants: Secondary | ICD-10-CM | POA: Diagnosis not present

## 2023-02-14 DIAGNOSIS — G8929 Other chronic pain: Secondary | ICD-10-CM | POA: Diagnosis not present

## 2023-02-14 DIAGNOSIS — N183 Chronic kidney disease, stage 3 unspecified: Secondary | ICD-10-CM | POA: Diagnosis not present

## 2023-02-14 DIAGNOSIS — M170 Bilateral primary osteoarthritis of knee: Secondary | ICD-10-CM | POA: Diagnosis not present

## 2023-02-14 DIAGNOSIS — M48061 Spinal stenosis, lumbar region without neurogenic claudication: Secondary | ICD-10-CM | POA: Diagnosis not present

## 2023-02-17 DIAGNOSIS — N183 Chronic kidney disease, stage 3 unspecified: Secondary | ICD-10-CM | POA: Diagnosis not present

## 2023-02-17 DIAGNOSIS — Z86711 Personal history of pulmonary embolism: Secondary | ICD-10-CM | POA: Diagnosis not present

## 2023-02-17 DIAGNOSIS — G8929 Other chronic pain: Secondary | ICD-10-CM | POA: Diagnosis not present

## 2023-02-17 DIAGNOSIS — Z9181 History of falling: Secondary | ICD-10-CM | POA: Diagnosis not present

## 2023-02-17 DIAGNOSIS — M170 Bilateral primary osteoarthritis of knee: Secondary | ICD-10-CM | POA: Diagnosis not present

## 2023-02-17 DIAGNOSIS — Z7901 Long term (current) use of anticoagulants: Secondary | ICD-10-CM | POA: Diagnosis not present

## 2023-02-17 DIAGNOSIS — M48061 Spinal stenosis, lumbar region without neurogenic claudication: Secondary | ICD-10-CM | POA: Diagnosis not present

## 2023-02-17 DIAGNOSIS — I129 Hypertensive chronic kidney disease with stage 1 through stage 4 chronic kidney disease, or unspecified chronic kidney disease: Secondary | ICD-10-CM | POA: Diagnosis not present

## 2023-02-20 DIAGNOSIS — M170 Bilateral primary osteoarthritis of knee: Secondary | ICD-10-CM | POA: Diagnosis not present

## 2023-02-20 DIAGNOSIS — G8929 Other chronic pain: Secondary | ICD-10-CM | POA: Diagnosis not present

## 2023-02-20 DIAGNOSIS — I129 Hypertensive chronic kidney disease with stage 1 through stage 4 chronic kidney disease, or unspecified chronic kidney disease: Secondary | ICD-10-CM | POA: Diagnosis not present

## 2023-02-20 DIAGNOSIS — M48061 Spinal stenosis, lumbar region without neurogenic claudication: Secondary | ICD-10-CM | POA: Diagnosis not present

## 2023-02-20 DIAGNOSIS — N183 Chronic kidney disease, stage 3 unspecified: Secondary | ICD-10-CM | POA: Diagnosis not present

## 2023-02-20 DIAGNOSIS — Z7901 Long term (current) use of anticoagulants: Secondary | ICD-10-CM | POA: Diagnosis not present

## 2023-02-21 DIAGNOSIS — I129 Hypertensive chronic kidney disease with stage 1 through stage 4 chronic kidney disease, or unspecified chronic kidney disease: Secondary | ICD-10-CM | POA: Diagnosis not present

## 2023-02-21 DIAGNOSIS — G8929 Other chronic pain: Secondary | ICD-10-CM | POA: Diagnosis not present

## 2023-02-21 DIAGNOSIS — M170 Bilateral primary osteoarthritis of knee: Secondary | ICD-10-CM | POA: Diagnosis not present

## 2023-02-21 DIAGNOSIS — Z7901 Long term (current) use of anticoagulants: Secondary | ICD-10-CM | POA: Diagnosis not present

## 2023-02-21 DIAGNOSIS — M48061 Spinal stenosis, lumbar region without neurogenic claudication: Secondary | ICD-10-CM | POA: Diagnosis not present

## 2023-02-21 DIAGNOSIS — N183 Chronic kidney disease, stage 3 unspecified: Secondary | ICD-10-CM | POA: Diagnosis not present

## 2023-02-22 ENCOUNTER — Telehealth: Payer: Self-pay

## 2023-02-22 DIAGNOSIS — Z7901 Long term (current) use of anticoagulants: Secondary | ICD-10-CM | POA: Diagnosis not present

## 2023-02-22 DIAGNOSIS — N183 Chronic kidney disease, stage 3 unspecified: Secondary | ICD-10-CM | POA: Diagnosis not present

## 2023-02-22 DIAGNOSIS — I129 Hypertensive chronic kidney disease with stage 1 through stage 4 chronic kidney disease, or unspecified chronic kidney disease: Secondary | ICD-10-CM | POA: Diagnosis not present

## 2023-02-22 DIAGNOSIS — M48061 Spinal stenosis, lumbar region without neurogenic claudication: Secondary | ICD-10-CM | POA: Diagnosis not present

## 2023-02-22 DIAGNOSIS — M170 Bilateral primary osteoarthritis of knee: Secondary | ICD-10-CM | POA: Diagnosis not present

## 2023-02-22 DIAGNOSIS — G8929 Other chronic pain: Secondary | ICD-10-CM | POA: Diagnosis not present

## 2023-02-22 NOTE — Telephone Encounter (Signed)
Patient's son calls nurse line requesting additional home health services. He is asking for increased assistance with personal care services.   Patient is enrolled in [place]. Printed off PCS forms, completed demographic and office information. Placed in provider box for completion.   Date of last office visit was 01/12/23. Please advise if patient needs additional appointment prior to completion.   Veronda Prude, RN

## 2023-02-24 ENCOUNTER — Telehealth: Payer: Self-pay | Admitting: Student

## 2023-02-24 NOTE — Telephone Encounter (Signed)
Son noticed dark red blood in pt's stool 5 days ago, it lessened throughout the week, only with streaks of blood in stool today.  Is eating well, not complaining of dizziness. Son states she feels well.  She is currently taking Eliquis d/t history of PE-risks and benefits have been discussed thoroughly in the past as patient has history of frequent falls. Advised son to hold Eliquis for now.  Appointment has been made with access to care for Monday to check point-of-care hemoglobin and further discuss. ED/urgent care precautions given

## 2023-02-27 ENCOUNTER — Ambulatory Visit (INDEPENDENT_AMBULATORY_CARE_PROVIDER_SITE_OTHER): Payer: Medicare Other | Admitting: Student

## 2023-02-27 VITALS — BP 155/91 | HR 98 | Ht 65.0 in | Wt 100.8 lb

## 2023-02-27 DIAGNOSIS — Z23 Encounter for immunization: Secondary | ICD-10-CM

## 2023-02-27 DIAGNOSIS — K921 Melena: Secondary | ICD-10-CM

## 2023-02-27 LAB — POCT HEMOGLOBIN: Hemoglobin: 15.6 g/dL — AB (ref 11–14.6)

## 2023-02-27 MED ORDER — OMEPRAZOLE 40 MG PO CPDR
40.0000 mg | DELAYED_RELEASE_CAPSULE | Freq: Every day | ORAL | 3 refills | Status: DC
Start: 2023-02-27 — End: 2023-03-10

## 2023-02-27 NOTE — Patient Instructions (Signed)
It was great to see you! Thank you for allowing me to participate in your care!   I recommend that you always bring your medications to each appointment as this makes it easy to ensure we are on the correct medications and helps [place] not miss when refills are needed.  Our plans for today:  - Take 40 mg of omeprazole daily - I have sent a referral to GI - Follow-up with PCP  Take care and seek immediate care sooner if you develop any concerns. Please remember to show up 15 minutes before your scheduled appointment time!  Tiffany Kocher, DO [facility]

## 2023-02-27 NOTE — Progress Notes (Signed)
    SUBJECTIVE:   CHIEF COMPLAINT / HPI:   Blood in stool 1 episode of hematochezia and melena on Friday.  Has had multiple bowel movements since, but patient is poor historian and son did not see most recent bowel movements.  They stopped Eliquis on Friday, per PCP recommendation.  No shortness of breath, no dizziness, no chest pain, no abdominal pain. She is overall asymptomatic and not having trouble ambulating.  No recent falls.  OBJECTIVE:   BP (!) 143/96   Pulse 98   Ht 5\' 5"  (1.651 m)   Wt 100 lb 12.8 oz (45.7 kg)   SpO2 95%   BMI 16.77 kg/m    General: NAD, pleasant Cardio: RRR, no MRG. Cap Refill <2s. Respiratory: CTAB, normal wob on RA GI: Abdomen is soft, not tender, not distended. BS present Skin: Warm and dry   ASSESSMENT/PLAN:   Assessment & Plan Blood in stool Hematochezia and melena on Friday.  Patient on Eliquis for prior unprovoked PE. POCT Hemoglobin 15 today.  Patient overall asymptomatic.  Had goals of care discussion with family, with regard to risk of bleeding versus risk of clotting.  Per son who is medical power of attorney and patient, they feel stopping Eliquis right now is the best option. - Start PPI - Outpatient GI follow-up - Hold Eliquis, if bleeding stops may be reasonable to restart at a later time, if causes identified-especially as patient was tolerating this medication well for 4 years. Encounter for immunization Flu shot today.   Tiffany Kocher, DO [facility]

## 2023-02-28 DIAGNOSIS — M48061 Spinal stenosis, lumbar region without neurogenic claudication: Secondary | ICD-10-CM | POA: Diagnosis not present

## 2023-02-28 DIAGNOSIS — G8929 Other chronic pain: Secondary | ICD-10-CM | POA: Diagnosis not present

## 2023-02-28 DIAGNOSIS — N183 Chronic kidney disease, stage 3 unspecified: Secondary | ICD-10-CM | POA: Diagnosis not present

## 2023-02-28 DIAGNOSIS — Z7901 Long term (current) use of anticoagulants: Secondary | ICD-10-CM | POA: Diagnosis not present

## 2023-02-28 DIAGNOSIS — M170 Bilateral primary osteoarthritis of knee: Secondary | ICD-10-CM | POA: Diagnosis not present

## 2023-02-28 DIAGNOSIS — I129 Hypertensive chronic kidney disease with stage 1 through stage 4 chronic kidney disease, or unspecified chronic kidney disease: Secondary | ICD-10-CM | POA: Diagnosis not present

## 2023-03-01 ENCOUNTER — Telehealth: Payer: Self-pay

## 2023-03-01 DIAGNOSIS — Z7901 Long term (current) use of anticoagulants: Secondary | ICD-10-CM | POA: Diagnosis not present

## 2023-03-01 DIAGNOSIS — I129 Hypertensive chronic kidney disease with stage 1 through stage 4 chronic kidney disease, or unspecified chronic kidney disease: Secondary | ICD-10-CM | POA: Diagnosis not present

## 2023-03-01 DIAGNOSIS — M170 Bilateral primary osteoarthritis of knee: Secondary | ICD-10-CM | POA: Diagnosis not present

## 2023-03-01 DIAGNOSIS — N183 Chronic kidney disease, stage 3 unspecified: Secondary | ICD-10-CM | POA: Diagnosis not present

## 2023-03-01 DIAGNOSIS — G8929 Other chronic pain: Secondary | ICD-10-CM | POA: Diagnosis not present

## 2023-03-01 DIAGNOSIS — M48061 Spinal stenosis, lumbar region without neurogenic claudication: Secondary | ICD-10-CM | POA: Diagnosis not present

## 2023-03-01 NOTE — Telephone Encounter (Signed)
Son calls nurse line in regards to recent office visit.   He reports she was seen for "what was thought" to be blood in her stool. However, he reports he believes the blood is coming from her urine.   He denies any dysuria, fevers/chills, urgency or incontinence.   Advised we would need to see her again to evaluate her urine.   Patient scheduled for tomorrow in ATC.   Precautions discussed.

## 2023-03-02 ENCOUNTER — Other Ambulatory Visit: Payer: Self-pay

## 2023-03-02 ENCOUNTER — Ambulatory Visit (INDEPENDENT_AMBULATORY_CARE_PROVIDER_SITE_OTHER): Payer: Medicare Other | Admitting: Family Medicine

## 2023-03-02 VITALS — BP 145/97 | HR 85 | Wt 102.2 lb

## 2023-03-02 DIAGNOSIS — R310 Gross hematuria: Secondary | ICD-10-CM

## 2023-03-02 DIAGNOSIS — I739 Peripheral vascular disease, unspecified: Secondary | ICD-10-CM | POA: Diagnosis not present

## 2023-03-02 DIAGNOSIS — L603 Nail dystrophy: Secondary | ICD-10-CM | POA: Diagnosis not present

## 2023-03-02 DIAGNOSIS — R823 Hemoglobinuria: Secondary | ICD-10-CM | POA: Diagnosis not present

## 2023-03-02 DIAGNOSIS — L84 Corns and callosities: Secondary | ICD-10-CM | POA: Diagnosis not present

## 2023-03-02 NOTE — Progress Notes (Signed)
    SUBJECTIVE:   CHIEF COMPLAINT / HPI:   Hematuria Patient arrives with patient's son. He reports that patient came to clinic a couple days ago due to concern for hematochezia. However, him and his sister (who both take care of patient) have noticed that patient actually is having hematuria. Patient had stopped Eliquis on Saturday 9/28 and had hematuria through Tuesday 10/1. She did not have any hematuria after that day. Patient denies light headedness. Denies dysuria. Denies frequency or urgency. Says she wears a depends due to long time incontinence and that it is not worse than it worse before. Denies fevers, abdominal pain, or back pain. Has not recently been on any antibiotics. Has not had hematuria before.   PERTINENT  PMH / PSH: Overactive bladder, history of hemorrhoids, H/o PE, CKD  OBJECTIVE:   BP (!) 145/97   Pulse 85   Wt 102 lb 3.2 oz (46.4 kg)   SpO2 95%   BMI 17.01 kg/m   General: frail appearing, in no acute distress, in wheelchair  CV: RRR, radial pulses equal and palpable, no BLE edema  Resp: Normal work of breathing on room air, CTAB Abd: Soft, non tender, non distended  Neuro: Alert & Oriented    ASSESSMENT/PLAN:   Assessment & Plan Hemoglobinuria High risk gross hematuria given patient age despite use of anticoagulation. No tobacco use or other risk factors for bladder cancer. Unlikely UTI given history and unlikely kidney stones.  - Will follow up with patient regarding urology referral and desire to pursue further workup with cystogram etc.  - UA Gross hematuria      Lockie Mola, MD [facility]

## 2023-03-03 DIAGNOSIS — M48061 Spinal stenosis, lumbar region without neurogenic claudication: Secondary | ICD-10-CM | POA: Diagnosis not present

## 2023-03-03 DIAGNOSIS — N183 Chronic kidney disease, stage 3 unspecified: Secondary | ICD-10-CM | POA: Diagnosis not present

## 2023-03-03 DIAGNOSIS — I129 Hypertensive chronic kidney disease with stage 1 through stage 4 chronic kidney disease, or unspecified chronic kidney disease: Secondary | ICD-10-CM | POA: Diagnosis not present

## 2023-03-03 DIAGNOSIS — G8929 Other chronic pain: Secondary | ICD-10-CM | POA: Diagnosis not present

## 2023-03-03 DIAGNOSIS — M170 Bilateral primary osteoarthritis of knee: Secondary | ICD-10-CM | POA: Diagnosis not present

## 2023-03-03 DIAGNOSIS — Z7901 Long term (current) use of anticoagulants: Secondary | ICD-10-CM | POA: Diagnosis not present

## 2023-03-04 DIAGNOSIS — R319 Hematuria, unspecified: Secondary | ICD-10-CM | POA: Insufficient documentation

## 2023-03-06 ENCOUNTER — Telehealth: Payer: Self-pay | Admitting: Family Medicine

## 2023-03-06 DIAGNOSIS — R310 Gross hematuria: Secondary | ICD-10-CM | POA: Diagnosis not present

## 2023-03-06 LAB — POCT URINALYSIS DIP (MANUAL ENTRY)
Bilirubin, UA: NEGATIVE
Glucose, UA: NEGATIVE mg/dL
Ketones, POC UA: NEGATIVE mg/dL
Nitrite, UA: NEGATIVE
Protein Ur, POC: 30 mg/dL — AB
Spec Grav, UA: 1.01 (ref 1.010–1.025)
Urobilinogen, UA: 0.2 U/dL
pH, UA: >=9 — AB (ref 5.0–8.0)

## 2023-03-06 NOTE — Addendum Note (Signed)
Addended by: Jone Baseman D on: 03/06/2023 12:07 PM   Modules accepted: Orders

## 2023-03-06 NOTE — Telephone Encounter (Signed)
Spoke with patient's son regarding patient's gross hematuria as well as trace hematuria on urinalysis.  Discussed that at patient's age regardless of anticoagulation use this will be concerning for bladder cancer and would recommend further follow-up.  Asked patient son if it would be within the patient wishes you further evaluate this and refer to urology for possible scope through the urethra.  Son said that this is what they would like and to further elucidate what options they have.  Had not had any more gross hematuria.  Referral to urology was placed.

## 2023-03-06 NOTE — Addendum Note (Signed)
Addended by: Jone Baseman D on: 03/06/2023 12:31 PM   Modules accepted: Orders

## 2023-03-07 ENCOUNTER — Encounter: Payer: Medicare Other | Admitting: Licensed Clinical Social Worker

## 2023-03-07 DIAGNOSIS — M170 Bilateral primary osteoarthritis of knee: Secondary | ICD-10-CM | POA: Diagnosis not present

## 2023-03-07 DIAGNOSIS — G8929 Other chronic pain: Secondary | ICD-10-CM | POA: Diagnosis not present

## 2023-03-07 DIAGNOSIS — M48061 Spinal stenosis, lumbar region without neurogenic claudication: Secondary | ICD-10-CM | POA: Diagnosis not present

## 2023-03-07 DIAGNOSIS — N183 Chronic kidney disease, stage 3 unspecified: Secondary | ICD-10-CM | POA: Diagnosis not present

## 2023-03-07 DIAGNOSIS — Z7901 Long term (current) use of anticoagulants: Secondary | ICD-10-CM | POA: Diagnosis not present

## 2023-03-07 DIAGNOSIS — I129 Hypertensive chronic kidney disease with stage 1 through stage 4 chronic kidney disease, or unspecified chronic kidney disease: Secondary | ICD-10-CM | POA: Diagnosis not present

## 2023-03-08 DIAGNOSIS — G8929 Other chronic pain: Secondary | ICD-10-CM | POA: Diagnosis not present

## 2023-03-08 DIAGNOSIS — M48061 Spinal stenosis, lumbar region without neurogenic claudication: Secondary | ICD-10-CM | POA: Diagnosis not present

## 2023-03-08 DIAGNOSIS — N183 Chronic kidney disease, stage 3 unspecified: Secondary | ICD-10-CM | POA: Diagnosis not present

## 2023-03-08 DIAGNOSIS — Z7901 Long term (current) use of anticoagulants: Secondary | ICD-10-CM | POA: Diagnosis not present

## 2023-03-08 DIAGNOSIS — M170 Bilateral primary osteoarthritis of knee: Secondary | ICD-10-CM | POA: Diagnosis not present

## 2023-03-08 DIAGNOSIS — I129 Hypertensive chronic kidney disease with stage 1 through stage 4 chronic kidney disease, or unspecified chronic kidney disease: Secondary | ICD-10-CM | POA: Diagnosis not present

## 2023-03-09 DIAGNOSIS — G8929 Other chronic pain: Secondary | ICD-10-CM | POA: Diagnosis not present

## 2023-03-09 DIAGNOSIS — Z7901 Long term (current) use of anticoagulants: Secondary | ICD-10-CM | POA: Diagnosis not present

## 2023-03-09 DIAGNOSIS — I129 Hypertensive chronic kidney disease with stage 1 through stage 4 chronic kidney disease, or unspecified chronic kidney disease: Secondary | ICD-10-CM | POA: Diagnosis not present

## 2023-03-09 DIAGNOSIS — N183 Chronic kidney disease, stage 3 unspecified: Secondary | ICD-10-CM | POA: Diagnosis not present

## 2023-03-09 DIAGNOSIS — M170 Bilateral primary osteoarthritis of knee: Secondary | ICD-10-CM | POA: Diagnosis not present

## 2023-03-09 DIAGNOSIS — M48061 Spinal stenosis, lumbar region without neurogenic claudication: Secondary | ICD-10-CM | POA: Diagnosis not present

## 2023-03-09 LAB — URINE CULTURE

## 2023-03-10 ENCOUNTER — Other Ambulatory Visit: Payer: Self-pay | Admitting: Student

## 2023-03-10 DIAGNOSIS — K921 Melena: Secondary | ICD-10-CM

## 2023-03-11 ENCOUNTER — Encounter (HOSPITAL_COMMUNITY): Payer: Self-pay | Admitting: Pharmacy Technician

## 2023-03-11 ENCOUNTER — Emergency Department (HOSPITAL_COMMUNITY): Payer: Medicare Other

## 2023-03-11 ENCOUNTER — Other Ambulatory Visit: Payer: Self-pay

## 2023-03-11 ENCOUNTER — Inpatient Hospital Stay (HOSPITAL_COMMUNITY)
Admission: EM | Admit: 2023-03-11 | Discharge: 2023-03-15 | DRG: 175 | Disposition: A | Discharge status: Skilled Nursing Facility | Payer: Medicare Other | Attending: Internal Medicine | Admitting: Internal Medicine

## 2023-03-11 ENCOUNTER — Observation Stay (HOSPITAL_COMMUNITY): Payer: Medicare Other

## 2023-03-11 DIAGNOSIS — R0602 Shortness of breath: Secondary | ICD-10-CM | POA: Diagnosis not present

## 2023-03-11 DIAGNOSIS — I82421 Acute embolism and thrombosis of right iliac vein: Secondary | ICD-10-CM | POA: Diagnosis present

## 2023-03-11 DIAGNOSIS — N3000 Acute cystitis without hematuria: Secondary | ICD-10-CM | POA: Diagnosis not present

## 2023-03-11 DIAGNOSIS — R000 Tachycardia, unspecified: Secondary | ICD-10-CM | POA: Diagnosis not present

## 2023-03-11 DIAGNOSIS — J9601 Acute respiratory failure with hypoxia: Secondary | ICD-10-CM | POA: Diagnosis not present

## 2023-03-11 DIAGNOSIS — I10 Essential (primary) hypertension: Secondary | ICD-10-CM | POA: Diagnosis not present

## 2023-03-11 DIAGNOSIS — R319 Hematuria, unspecified: Secondary | ICD-10-CM | POA: Diagnosis present

## 2023-03-11 DIAGNOSIS — Z1629 Resistance to other single specified antibiotic: Secondary | ICD-10-CM | POA: Diagnosis present

## 2023-03-11 DIAGNOSIS — I2699 Other pulmonary embolism without acute cor pulmonale: Principal | ICD-10-CM | POA: Diagnosis present

## 2023-03-11 DIAGNOSIS — I517 Cardiomegaly: Secondary | ICD-10-CM | POA: Diagnosis not present

## 2023-03-11 DIAGNOSIS — I82411 Acute embolism and thrombosis of right femoral vein: Secondary | ICD-10-CM | POA: Diagnosis present

## 2023-03-11 DIAGNOSIS — Z1152 Encounter for screening for COVID-19: Secondary | ICD-10-CM

## 2023-03-11 DIAGNOSIS — R310 Gross hematuria: Secondary | ICD-10-CM | POA: Diagnosis not present

## 2023-03-11 DIAGNOSIS — J9600 Acute respiratory failure, unspecified whether with hypoxia or hypercapnia: Secondary | ICD-10-CM | POA: Diagnosis not present

## 2023-03-11 DIAGNOSIS — E872 Acidosis, unspecified: Secondary | ICD-10-CM | POA: Diagnosis not present

## 2023-03-11 DIAGNOSIS — R911 Solitary pulmonary nodule: Secondary | ICD-10-CM | POA: Diagnosis not present

## 2023-03-11 DIAGNOSIS — Z880 Allergy status to penicillin: Secondary | ICD-10-CM

## 2023-03-11 DIAGNOSIS — N183 Chronic kidney disease, stage 3 unspecified: Secondary | ICD-10-CM | POA: Diagnosis present

## 2023-03-11 DIAGNOSIS — Z86711 Personal history of pulmonary embolism: Secondary | ICD-10-CM | POA: Diagnosis not present

## 2023-03-11 DIAGNOSIS — I824Y1 Acute embolism and thrombosis of unspecified deep veins of right proximal lower extremity: Secondary | ICD-10-CM | POA: Diagnosis not present

## 2023-03-11 DIAGNOSIS — Z886 Allergy status to analgesic agent status: Secondary | ICD-10-CM

## 2023-03-11 DIAGNOSIS — I82431 Acute embolism and thrombosis of right popliteal vein: Secondary | ICD-10-CM | POA: Diagnosis present

## 2023-03-11 DIAGNOSIS — M1990 Unspecified osteoarthritis, unspecified site: Secondary | ICD-10-CM | POA: Diagnosis present

## 2023-03-11 DIAGNOSIS — N3001 Acute cystitis with hematuria: Secondary | ICD-10-CM | POA: Diagnosis present

## 2023-03-11 DIAGNOSIS — Z79899 Other long term (current) drug therapy: Secondary | ICD-10-CM

## 2023-03-11 DIAGNOSIS — I129 Hypertensive chronic kidney disease with stage 1 through stage 4 chronic kidney disease, or unspecified chronic kidney disease: Secondary | ICD-10-CM | POA: Diagnosis present

## 2023-03-11 DIAGNOSIS — N390 Urinary tract infection, site not specified: Principal | ICD-10-CM

## 2023-03-11 DIAGNOSIS — R1314 Dysphagia, pharyngoesophageal phase: Secondary | ICD-10-CM | POA: Diagnosis not present

## 2023-03-11 DIAGNOSIS — Z8711 Personal history of peptic ulcer disease: Secondary | ICD-10-CM

## 2023-03-11 DIAGNOSIS — R0689 Other abnormalities of breathing: Secondary | ICD-10-CM | POA: Diagnosis not present

## 2023-03-11 DIAGNOSIS — J9811 Atelectasis: Secondary | ICD-10-CM | POA: Diagnosis not present

## 2023-03-11 DIAGNOSIS — R1311 Dysphagia, oral phase: Secondary | ICD-10-CM | POA: Diagnosis not present

## 2023-03-11 DIAGNOSIS — R531 Weakness: Secondary | ICD-10-CM | POA: Diagnosis not present

## 2023-03-11 DIAGNOSIS — Z7901 Long term (current) use of anticoagulants: Secondary | ICD-10-CM | POA: Diagnosis not present

## 2023-03-11 DIAGNOSIS — N1832 Chronic kidney disease, stage 3b: Secondary | ICD-10-CM | POA: Diagnosis not present

## 2023-03-11 DIAGNOSIS — M4986 Spondylopathy in diseases classified elsewhere, lumbar region: Secondary | ICD-10-CM | POA: Diagnosis not present

## 2023-03-11 DIAGNOSIS — E8809 Other disorders of plasma-protein metabolism, not elsewhere classified: Secondary | ICD-10-CM | POA: Diagnosis present

## 2023-03-11 DIAGNOSIS — R2689 Other abnormalities of gait and mobility: Secondary | ICD-10-CM | POA: Diagnosis not present

## 2023-03-11 DIAGNOSIS — Z8261 Family history of arthritis: Secondary | ICD-10-CM

## 2023-03-11 DIAGNOSIS — E559 Vitamin D deficiency, unspecified: Secondary | ICD-10-CM | POA: Diagnosis present

## 2023-03-11 DIAGNOSIS — B964 Proteus (mirabilis) (morganii) as the cause of diseases classified elsewhere: Secondary | ICD-10-CM | POA: Diagnosis present

## 2023-03-11 DIAGNOSIS — Z8249 Family history of ischemic heart disease and other diseases of the circulatory system: Secondary | ICD-10-CM

## 2023-03-11 DIAGNOSIS — Z7401 Bed confinement status: Secondary | ICD-10-CM | POA: Diagnosis not present

## 2023-03-11 DIAGNOSIS — M6259 Muscle wasting and atrophy, not elsewhere classified, multiple sites: Secondary | ICD-10-CM | POA: Diagnosis not present

## 2023-03-11 DIAGNOSIS — K449 Diaphragmatic hernia without obstruction or gangrene: Secondary | ICD-10-CM | POA: Diagnosis not present

## 2023-03-11 DIAGNOSIS — A419 Sepsis, unspecified organism: Secondary | ICD-10-CM | POA: Diagnosis not present

## 2023-03-11 DIAGNOSIS — M6281 Muscle weakness (generalized): Secondary | ICD-10-CM | POA: Diagnosis not present

## 2023-03-11 DIAGNOSIS — R0902 Hypoxemia: Secondary | ICD-10-CM | POA: Diagnosis not present

## 2023-03-11 DIAGNOSIS — R918 Other nonspecific abnormal finding of lung field: Secondary | ICD-10-CM | POA: Diagnosis not present

## 2023-03-11 HISTORY — DX: Personal history of pulmonary embolism: Z86.711

## 2023-03-11 HISTORY — DX: Acute cystitis without hematuria: N30.00

## 2023-03-11 HISTORY — DX: Acute respiratory failure with hypoxia: J96.01

## 2023-03-11 LAB — CBC WITH DIFFERENTIAL/PLATELET
Abs Immature Granulocytes: 0.02 10*3/uL (ref 0.00–0.07)
Basophils Absolute: 0 10*3/uL (ref 0.0–0.1)
Basophils Relative: 0 %
Eosinophils Absolute: 0.1 10*3/uL (ref 0.0–0.5)
Eosinophils Relative: 1 %
HCT: 40.9 % (ref 36.0–46.0)
Hemoglobin: 13.6 g/dL (ref 12.0–15.0)
Immature Granulocytes: 0 %
Lymphocytes Relative: 17 %
Lymphs Abs: 1 10*3/uL (ref 0.7–4.0)
MCH: 29.2 pg (ref 26.0–34.0)
MCHC: 33.3 g/dL (ref 30.0–36.0)
MCV: 88 fL (ref 80.0–100.0)
Monocytes Absolute: 0.7 10*3/uL (ref 0.1–1.0)
Monocytes Relative: 11 %
Neutro Abs: 4.2 10*3/uL (ref 1.7–7.7)
Neutrophils Relative %: 71 %
Platelets: 179 10*3/uL (ref 150–400)
RBC: 4.65 MIL/uL (ref 3.87–5.11)
RDW: 14.3 % (ref 11.5–15.5)
WBC: 5.9 10*3/uL (ref 4.0–10.5)
nRBC: 0 % (ref 0.0–0.2)

## 2023-03-11 LAB — URINALYSIS, W/ REFLEX TO CULTURE (INFECTION SUSPECTED)
Bilirubin Urine: NEGATIVE
Glucose, UA: NEGATIVE mg/dL
Hgb urine dipstick: NEGATIVE
Ketones, ur: NEGATIVE mg/dL
Nitrite: NEGATIVE
Protein, ur: NEGATIVE mg/dL
Specific Gravity, Urine: 1.01 (ref 1.005–1.030)
WBC, UA: >50 WBC/hpf (ref 0–5)
pH: 7 (ref 5.0–8.0)

## 2023-03-11 LAB — COMPREHENSIVE METABOLIC PANEL
ALT: 20 U/L (ref 0–44)
AST: 20 U/L (ref 15–41)
Albumin: 3.2 g/dL — ABNORMAL LOW (ref 3.5–5.0)
Alkaline Phosphatase: 74 U/L (ref 38–126)
Anion gap: 10 (ref 5–15)
BUN: 21 mg/dL (ref 8–23)
CO2: 24 mmol/L (ref 22–32)
Calcium: 9.1 mg/dL (ref 8.9–10.3)
Chloride: 106 mmol/L (ref 98–111)
Creatinine, Ser: 1.26 mg/dL — ABNORMAL HIGH (ref 0.44–1.00)
GFR, Estimated: 40 mL/min — ABNORMAL LOW (ref >60)
Glucose, Bld: 130 mg/dL — ABNORMAL HIGH (ref 70–99)
Potassium: 4.3 mmol/L (ref 3.5–5.1)
Sodium: 140 mmol/L (ref 135–145)
Total Bilirubin: 0.8 mg/dL (ref 0.3–1.2)
Total Protein: 6.9 g/dL (ref 6.5–8.1)

## 2023-03-11 LAB — APTT: aPTT: 27 s (ref 24–36)

## 2023-03-11 LAB — I-STAT CG4 LACTIC ACID, ED
Lactic Acid, Venous: 3.1 mmol/L (ref 0.5–1.9)
Lactic Acid, Venous: 2.9 mmol/L (ref 0.5–1.9)

## 2023-03-11 LAB — SARS CORONAVIRUS 2 BY RT PCR: SARS Coronavirus 2 by RT PCR: NEGATIVE

## 2023-03-11 LAB — PROTIME-INR
INR: 1.2 (ref 0.8–1.2)
Prothrombin Time: 15.5 s — ABNORMAL HIGH (ref 11.4–15.2)

## 2023-03-11 LAB — LACTIC ACID, PLASMA: Lactic Acid, Venous: 2 mmol/L (ref 0.5–1.9)

## 2023-03-11 MED ORDER — ACETAMINOPHEN 325 MG PO TABS: 650.0000 mg | ORAL_TABLET | Freq: Four times a day (QID) | ORAL | Status: DC | PRN

## 2023-03-11 MED ORDER — HYDRALAZINE HCL 25 MG PO TABS
25.0000 mg | ORAL_TABLET | Freq: Four times a day (QID) | ORAL | Status: DC | PRN
  Administered 2023-03-11: 25 mg via ORAL
  Filled 2023-03-11: qty 1

## 2023-03-11 MED ORDER — HEPARIN SODIUM (PORCINE) 5000 UNIT/ML IJ SOLN: 5000.0000 [IU] | Freq: Three times a day (TID) | INTRAMUSCULAR | Status: DC

## 2023-03-11 MED ORDER — ATORVASTATIN CALCIUM 40 MG PO TABS
40.0000 mg | ORAL_TABLET | Freq: Every day | ORAL | Status: DC
  Administered 2023-03-12 – 2023-03-15 (×4): 40 mg via ORAL
  Filled 2023-03-11 (×4): qty 1

## 2023-03-11 MED ORDER — LACTATED RINGERS IV BOLUS
30.0000 mL/kg | Freq: Once | INTRAVENOUS | Status: AC
  Administered 2023-03-11: 1392 mL via INTRAVENOUS

## 2023-03-11 MED ORDER — FESOTERODINE FUMARATE ER 4 MG PO TB24: 4.0000 mg | ORAL_TABLET | Freq: Every day | ORAL | Status: DC

## 2023-03-11 MED ORDER — ENSURE ENLIVE PO LIQD
237.0000 mL | Freq: Two times a day (BID) | ORAL | Status: DC
  Administered 2023-03-12 – 2023-03-15 (×5): 237 mL via ORAL

## 2023-03-11 MED ORDER — HEPARIN BOLUS VIA INFUSION
1400.0000 [IU] | Freq: Once | INTRAVENOUS | Status: AC
  Administered 2023-03-11: 1400 [IU] via INTRAVENOUS
  Filled 2023-03-11: qty 1400

## 2023-03-11 MED ORDER — ACETAMINOPHEN 650 MG RE SUPP: 650.0000 mg | Freq: Four times a day (QID) | RECTAL | Status: DC | PRN

## 2023-03-11 MED ORDER — LACTATED RINGERS IV SOLN: INTRAVENOUS | Status: AC

## 2023-03-11 MED ORDER — HEPARIN (PORCINE) 25000 UT/250ML-% IV SOLN
750.0000 [IU]/h | INTRAVENOUS | Status: AC
  Administered 2023-03-11 – 2023-03-13 (×2): 750 [IU]/h via INTRAVENOUS
  Filled 2023-03-11 (×2): qty 250

## 2023-03-11 MED ORDER — LACTATED RINGERS IV SOLN: INTRAVENOUS | Status: DC

## 2023-03-11 MED ORDER — SODIUM CHLORIDE 0.9 % IV SOLN
2.0000 g | INTRAVENOUS | Status: DC
  Administered 2023-03-11 – 2023-03-13 (×3): 2 g via INTRAVENOUS
  Filled 2023-03-11 (×3): qty 20

## 2023-03-11 MED ORDER — SENNOSIDES-DOCUSATE SODIUM 8.6-50 MG PO TABS: 1.0000 | ORAL_TABLET | Freq: Every evening | ORAL | Status: DC | PRN

## 2023-03-11 MED ORDER — PANTOPRAZOLE SODIUM 40 MG PO TBEC
40.0000 mg | DELAYED_RELEASE_TABLET | Freq: Every day | ORAL | Status: DC
  Administered 2023-03-12 – 2023-03-15 (×4): 40 mg via ORAL
  Filled 2023-03-11 (×4): qty 1

## 2023-03-11 MED ORDER — ONDANSETRON HCL 4 MG PO TABS: 4.0000 mg | ORAL_TABLET | Freq: Four times a day (QID) | ORAL | Status: DC | PRN

## 2023-03-11 MED ORDER — IOHEXOL 350 MG/ML SOLN
75.0000 mL | Freq: Once | INTRAVENOUS | Status: AC | PRN
  Administered 2023-03-11: 75 mL via INTRAVENOUS

## 2023-03-11 MED ORDER — MIRABEGRON ER 25 MG PO TB24
50.0000 mg | ORAL_TABLET | Freq: Every day | ORAL | Status: DC
  Administered 2023-03-12 – 2023-03-15 (×4): 50 mg via ORAL
  Filled 2023-03-11 (×4): qty 2

## 2023-03-11 MED ORDER — LATANOPROST 0.005 % OP SOLN
1.0000 [drp] | Freq: Every day | OPHTHALMIC | Status: DC
  Administered 2023-03-11 – 2023-03-14 (×4): 1 [drp] via OPHTHALMIC
  Filled 2023-03-11: qty 2.5

## 2023-03-11 MED ORDER — FESOTERODINE FUMARATE ER 4 MG PO TB24
4.0000 mg | ORAL_TABLET | Freq: Every day | ORAL | Status: DC
  Administered 2023-03-11 – 2023-03-14 (×4): 4 mg via ORAL
  Filled 2023-03-11 (×5): qty 1

## 2023-03-11 MED ORDER — ONDANSETRON HCL 4 MG/2ML IJ SOLN: 4.0000 mg | Freq: Four times a day (QID) | INTRAMUSCULAR | Status: DC | PRN

## 2023-03-11 MED ORDER — SODIUM CHLORIDE 0.9% FLUSH
3.0000 mL | Freq: Two times a day (BID) | INTRAVENOUS | Status: DC
  Administered 2023-03-14 – 2023-03-15 (×3): 3 mL via INTRAVENOUS

## 2023-03-11 NOTE — ED Notes (Signed)
Patient has urine culture in the main lab °

## 2023-03-11 NOTE — Plan of Care (Signed)
Problem: Health Behavior/Discharge Planning: Goal: Ability to manage health-related needs will improve Outcome: Progressing   Problem: Elimination: Goal: Will not experience complications related to urinary retention Outcome: Progressing   Problem: Pain Managment: Goal: General experience of comfort will improve Outcome: Progressing

## 2023-03-11 NOTE — H&P (Addendum)
History and Physical    KILLIAN Leach WJX:[id number] DOB: 02/04/1930 DOA: 03/11/2023  PCP: Erick Alley, DO   Patient coming from: Home   Chief Complaint: SOB  HPI: Becky Leach is a 89 y.o. female with medical history significant for spinal stenosis, CKD 3B, unprovoked PE in 2018, and recent hematuria for which she has been off of Eliquis, now presenting with shortness of breath.  Patient developed gross hematuria last month, stopped Eliquis on 02/25/2023, has not had any gross hematuria since 02/28/2023, but has become short of breath.  She has a nonproductive cough.  She denies any change in her chronic leg swelling.  She had a temperature of 100 F at home.  She reports dysuria but denies flank pain.  Her son states that she was referred to urology for her hematuria and they are waiting for call to schedule the appointment.  ED Course: Upon arrival to the ED, patient is found to be afebrile and saturating in the low 90s on 2 L/min of supplemental oxygen with mild tachycardia and stable blood pressure.  EKG demonstrates sinus tachycardia with PAC.  Chest x-ray is negative for acute findings.  Labs are most notable for lactic acid 3.1, normal WBC, normal hemoglobin, and creatinine 1.26.  Blood and urine cultures were collected in the ED and the patient was given 1.4 L LR and 2 g IV Rocephin.  Review of Systems:  All other systems reviewed and apart from HPI, are negative.  Past Medical History:  Diagnosis Date   AKI (acute kidney injury) (HCC)    due to NSAID   Arthritis    Closed fracture of left proximal humerus 09/14/2017   Constipation    DVT (deep venous thrombosis) (HCC)    Herniated disc    Hypertension    Pulmonary embolism (HCC)    Seasonal allergies    Spinal stenosis    Stomach ulcer    due to NSAID   Tachycardia    UTI (lower urinary tract infection)    Vitamin D deficiency 09/14/2017    Past Surgical History:  Procedure Laterality Date   COLONOSCOPY  12/21/2011    Procedure: COLONOSCOPY;  Surgeon: [name], MD;  Location: [facility] END[facility]COPY;  Service: Endoscopy;  Laterality: N/A;   DILATION AND CURETTAGE OF UTERUS     ORIF HUMERUS FRACTURE Left 09/12/2017   Procedure: OPEN REDUCTION INTERNAL FIXATION (ORIF) PROXIMAL  HUMERUS FRACTURE;  Surgeon: Myrene Galas, MD;  Location: [facility] OR;  Service: Orthopedics;  Laterality: Left;   TONSILLECTOMY AND ADENOIDECTOMY      Social History:   reports that she has never smoked. She has never used smokeless tobacco. She reports that she does not drink alcohol and does not use drugs.  Allergies  Allergen Reactions   Nsaids Other (See Comments)    "NSAIDS WILL CAUSE BLEEDING ULCERS"- do NOT give   Penicillins Rash and Other (See Comments)    Cephalosporin tolerant given during surgery 08/2017    Family History  Problem Relation Age of Onset   Arthritis Mother    Heart attack Mother      Prior to Admission medications   Medication Sig Start Date End Date Taking? Authorizing Provider  ELIQUIS 2.5 MG TABS tablet TAKE 1 TABLET BY MOUTH TWICE A DAY 02/07/23  Yes Mannam, Praveen, MD  atorvastatin (LIPITOR) 40 MG tablet TAKE 1 TABLET BY MOUTH EVERY DAY 12/06/22   Jerre Simon, MD  Cholecalciferol (VITAMIN D-3) 25 MCG (1000 UT) CAPS Take 1,000  Units by mouth daily.    [provider]  CVS D3 2000 units CAPS TAKE 1 TABLET (2,000 UNITS TOTAL) BY MOUTH 2 (TWO) TIMES DAILY. Patient not taking: Reported on 08/22/2020 10/06/17   Freddrick March, MD  famotidine (PEPCID) 20 MG tablet Take 20 mg by mouth in the morning.    [provider]  latanoprost (XALATAN) 0.005 % ophthalmic solution Place 1 drop into both eyes at bedtime. 07/03/20   [provider]  loratadine (CLARITIN) 10 MG tablet Take 1 tablet (10 mg total) by mouth daily. Patient taking differently: Take 10 mg by mouth in the morning. 01/03/18   Mannam, Colbert Coyer, MD  MYRBETRIQ 50 MG TB24 tablet Take 50 mg by mouth in the morning. 11/22/18   [provider]  omeprazole (PRILOSEC) 40 MG capsule TAKE 1 CAPSULE (40 MG TOTAL) BY MOUTH DAILY. 03/10/23   Erick Alley, DO  Trolamine Salicylate (ASPERCREME EX) Apply 1 application topically as needed (for pain). Patient not taking: Reported on 01/12/2023    [provider]  trospium (SANCTURA) 20 MG tablet Take 20 mg by mouth at bedtime.    [provider]    Physical Exam: Vitals:   03/11/23 1348 03/11/23 1353 03/11/23 1428  BP: (!) 163/95    Pulse: (!) 108 (!) 109   Resp:  12   Temp:   99.7 F (37.6 C)  TempSrc:   Rectal  SpO2: 97% 92%     Constitutional: NAD, calm  Eyes: PERTLA, lids and conjunctivae normal ENMT: Mucous membranes are moist. Posterior pharynx clear of any exudate or lesions.   Neck: supple, no masses  Respiratory: no wheezing, no crackles. No accessory muscle use.  Cardiovascular: S1 & S2 heard, regular rate and rhythm. No extremity edema.   Abdomen: No distension, no tenderness, soft. Bowel sounds active.  Musculoskeletal: no clubbing / cyanosis. No joint deformity upper and lower extremities.   Skin: no significant rashes, lesions, ulcers. Warm, dry, well-perfused. Neurologic: CN 2-12 grossly intact. Moving all extremities. Alert and oriented.  Psychiatric: Pleasant. Cooperative.    Labs and Imaging on Admission: I have personally reviewed following labs and imaging studies  CBC: Recent Labs  Lab 03/11/23 1420  WBC 5.9  NEUTROABS 4.2  HGB 13.6  HCT 40.9  MCV 88.0  PLT 179   Basic Metabolic Panel: Recent Labs  Lab 03/11/23 1420  NA 140  K 4.3  CL 106  CO2 24  GLUCOSE 130*  BUN 21  CREATININE 1.26*  CALCIUM 9.1   GFR: Estimated Creatinine Clearance: 21.3 mL/min (A) (by C-G formula based on SCr of 1.26 mg/dL (H)). Liver Function Tests: Recent Labs  Lab 03/11/23 1420  AST 20  ALT 20  ALKPHOS 74  BILITOT 0.8  PROT 6.9  ALBUMIN 3.2*   No results for input(s): "LIPASE", "AMYLASE" in the last 168 hours. No  results for input(s): "AMMONIA" in the last 168 hours. Coagulation Profile: Recent Labs  Lab 03/11/23 1420  INR 1.2   Cardiac Enzymes: No results for input(s): "CKTOTAL", "CKMB", "CKMBINDEX", "TROPONINI" in the last 168 hours. BNP (last 3 results) No results for input(s): "PROBNP" in the last 8760 hours. HbA1C: No results for input(s): "HGBA1C" in the last 72 hours. CBG: No results for input(s): "GLUCAP" in the last 168 hours. Lipid Profile: No results for input(s): "CHOL", "HDL", "LDLCALC", "TRIG", "CHOLHDL", "LDLDIRECT" in the last 72 hours. Thyroid Function Tests: No results for input(s): "TSH", "T4TOTAL", "FREET4", "T3FREE", "THYROIDAB" in the last 72 hours.  Anemia Panel: No results for input(s): "VITAMINB12", "FOLATE", "FERRITIN", "TIBC", "IRON", "RETICCTPCT" in the last 72 hours. Urine analysis:    Component Value Date/Time   COLORURINE YELLOW 03/11/2023 1447   APPEARANCEUR HAZY (A) 03/11/2023 1447   LABSPEC 1.010 03/11/2023 1447   PHURINE 7.0 03/11/2023 1447   GLUCOSEU NEGATIVE 03/11/2023 1447   HGBUR NEGATIVE 03/11/2023 1447   BILIRUBINUR NEGATIVE 03/11/2023 1447   BILIRUBINUR negative 03/06/2023 1229   KETONESUR NEGATIVE 03/11/2023 1447   PROTEINUR NEGATIVE 03/11/2023 1447   UROBILINOGEN 0.2 03/06/2023 1229   UROBILINOGEN 0.2 12/02/2014 1543   NITRITE NEGATIVE 03/11/2023 1447   LEUKOCYTESUR LARGE (A) 03/11/2023 1447   Sepsis Labs: @LABRCNTIP (procalcitonin:4,lacticidven:4) ) Recent Results (from the past 240 hour(s))  Urine Culture     Status: Abnormal   Collection Time: 03/06/23  5:28 PM   Specimen: Urine   UC  Result Value Ref Range Status   Urine Culture, Routine Final report (A)  Final   Organism ID, Bacteria Proteus mirabilis (A)  Final    Comment: Cefazolin <=4 ug/mL Cefazolin with an MIC <=16 predicts susceptibility to the oral agents cefaclor, cefdinir, cefpodoxime, cefprozil, cefuroxime, cephalexin, and loracarbef when used for therapy of  uncomplicated urinary tract infections due to E. coli, Klebsiella pneumoniae, and Proteus mirabilis. Greater than 100,000 colony forming units per mL    Antimicrobial Susceptibility Comment  Final    Comment:       ** S = Susceptible; I = Intermediate; R = Resistant **                    P = Positive; N = Negative             MICS are expressed in micrograms per mL    Antibiotic                 RSLT#1    RSLT#2    RSLT#3    RSLT#4 Amoxicillin/Clavulanic Acid    S Ampicillin                     S Cefepime                       S Ceftriaxone                    S Cefuroxime                     S Ciprofloxacin                  S Ertapenem                      S Gentamicin                     S Levofloxacin                   S Meropenem                      S Nitrofurantoin                 R Piperacillin/Tazobactam        S Tetracycline                   R Tobramycin  S Trimethoprim/Sulfa             S   SARS Coronavirus 2 by RT PCR (hospital order, performed in [facility] hospital lab) *cepheid single result test* Anterior Nasal Swab     Status: None   Collection Time: 03/11/23  2:01 PM   Specimen: Anterior Nasal Swab  Result Value Ref Range Status   SARS Coronavirus 2 by RT PCR NEGATIVE NEGATIVE Final    Comment: (NOTE) SARS-CoV-2 target nucleic acids are NOT DETECTED.  The SARS-CoV-2 RNA is generally detectable in upper and lower respiratory specimens during the acute phase of infection. The lowest concentration of SARS-CoV-2 viral copies this assay can detect is 250 copies / mL. A negative result does not preclude SARS-CoV-2 infection and should not be used as the sole basis for treatment or other patient management decisions.  A negative result may occur with improper specimen collection / handling, submission of specimen other than nasopharyngeal swab, presence of viral mutation(s) within the areas targeted by this assay, and inadequate number of viral  copies (<250 copies / mL). A negative result must be combined with clinical observations, patient history, and epidemiological information.  Fact Sheet for Patients:   RoadLapTop.co.za  Fact Sheet for Healthcare Providers: http://kim-miller.com/  This test is not yet approved or  cleared by the [place] FDA and has been authorized for detection and/or diagnosis of SARS-CoV-2 by FDA under an Emergency Use Authorization (EUA).  This EUA will remain in effect (meaning this test can be used) for the duration of the COVID-19 declaration under Section 564(b)(1) of the Act, 21 U.S.C. section 360bbb-3(b)(1), unless the authorization is terminated or revoked sooner.  Performed at [facility], [address], [place], [place] [address]      Radiological Exams on Admission: DG Chest Port 1 View  Result Date: 03/11/2023 CLINICAL DATA:  Shortness of breath. Evaluate for abnormality. Sepsis. EXAM: PORTABLE CHEST 1 VIEW COMPARISON:  09/11/2017. FINDINGS: Cardiomegaly. Aortic atherosclerotic calcification. Bibasilar atelectasis. Trace pleural effusions or pleural thickening. No pneumothorax. Postoperative change left humerus. Large hiatal hernia IMPRESSION: No acute abnormality.  Cardiomegaly. Large hiatal hernia. Electronically Signed   By: Minerva Fester M.D.   On: 03/11/2023 15:48    EKG: Independently reviewed. Sinus tachycardia, rate 110, PAC.   Assessment/Plan   1. Acute hypoxic respiratory failure  - Presents with SOB and found to have new 2 Lpm supplemental O2 requirement  - No acute findings on CXR  - She has been off of Eliquis since 9/28 d/t hematuria  - Check CTA chest, continue supplemental O2 as needed    ADDENDUM: Received call from radiologist stating that there is PE in bilateral upper pulmonary arteries without right heart strain on CT. Plan to start IV heparin for now.    2. UTI  - Continue Rocephin,  follow cultures    3. CKD 3B  - Appears close to baseline  - Renally-dose medications, monitor    4. Hematuria  - None since shortly after stopping Eliquis  - Pt is being scheduled for outpatient urology consultation    5. Lung nodule - Noted incidentally on CT in ED - Outpatient follow-up recommended    DVT prophylaxis: sq heparin  Code Status: Full  Level of Care: Level of care: Telemetry Family Communication: Son at bedside  Disposition Plan:  Patient is from: home  Anticipated d/c is to: Home  Anticipated d/c date is: 10/13 or 03/13/23  Patient currently: Pending CTA chest, stable  vitals  Consults called: None  Admission status: Observation     Briscoe Deutscher, MD [facility]  03/11/2023, 4:40 PM

## 2023-03-11 NOTE — ED Notes (Signed)
After checking twice patient BP remains high currently 164/117 Opyd, MD made aware.

## 2023-03-11 NOTE — ED Notes (Signed)
Lactic istat resulted 2.85 RN aware

## 2023-03-11 NOTE — ED Provider Notes (Signed)
[place] EMERGENCY DEPARTMENT AT [facility] Provider Note   CSN: [id number] Arrival date & time: 03/11/23  1340     History  No chief complaint on file.   Becky Leach is a 89 y.o. female.  89 year old female with history of PE who stopped her Eliquis due to hematuria presents with shortness of breath tachypnea as well as tachycardic.  Patient has had a temperature at home up to 100 degrees.  Notes nonproductive cough.  Some myalgias without urinary symptoms.  Denies any vomiting or diarrhea.  EMS arrived and patient's O2 sat was 89%.  Was found to be tachycardic with heart rate from 1 10-1 20.  Given 1 L prior to arrival here and transported       Home Medications Prior to Admission medications   Medication Sig Start Date End Date Taking? Authorizing Provider  atorvastatin (LIPITOR) 40 MG tablet TAKE 1 TABLET BY MOUTH EVERY DAY 12/06/22   Jerre Simon, MD  Cholecalciferol (VITAMIN D-3) 25 MCG (1000 UT) CAPS Take 1,000 Units by mouth daily.    [provider]  CVS D3 2000 units CAPS TAKE 1 TABLET (2,000 UNITS TOTAL) BY MOUTH 2 (TWO) TIMES DAILY. Patient not taking: Reported on 08/22/2020 10/06/17   Freddrick March, MD  ELIQUIS 2.5 MG TABS tablet TAKE 1 TABLET BY MOUTH TWICE A DAY 02/07/23   Mannam, Praveen, MD  famotidine (PEPCID) 20 MG tablet Take 20 mg by mouth in the morning.    [provider]  latanoprost (XALATAN) 0.005 % ophthalmic solution Place 1 drop into both eyes at bedtime. 07/03/20   [provider]  loratadine (CLARITIN) 10 MG tablet Take 1 tablet (10 mg total) by mouth daily. Patient taking differently: Take 10 mg by mouth in the morning. 01/03/18   Mannam, Colbert Coyer, MD  MYRBETRIQ 50 MG TB24 tablet Take 50 mg by mouth in the morning. 11/22/18   [provider]  omeprazole (PRILOSEC) 40 MG capsule TAKE 1 CAPSULE (40 MG TOTAL) BY MOUTH DAILY. 03/10/23   Erick Alley, DO  Trolamine Salicylate (ASPERCREME EX) Apply 1 application  topically as needed (for pain). Patient not taking: Reported on 01/12/2023    [provider]  trospium (SANCTURA) 20 MG tablet Take 20 mg by mouth at bedtime.    [provider]      Allergies    Nsaids and Penicillins    Review of Systems   Review of Systems  All other systems reviewed and are negative.   Physical Exam Updated Vital Signs BP (!) 163/95   Pulse (!) 109   Resp 12   SpO2 92%  Physical Exam Vitals and nursing note reviewed.  Constitutional:      General: She is not in acute distress.    Appearance: Normal appearance. She is well-developed. She is not toxic-appearing.  HENT:     Head: Normocephalic and atraumatic.  Eyes:     General: Lids are normal.     Conjunctiva/sclera: Conjunctivae normal.     Pupils: Pupils are equal, round, and reactive to light.  Neck:     Thyroid: No thyroid mass.     Trachea: No tracheal deviation.  Cardiovascular:     Rate and Rhythm: Regular rhythm. Tachycardia present.     Heart sounds: Normal heart sounds. No murmur heard.    No gallop.  Pulmonary:     Effort: Pulmonary effort is normal. No respiratory distress.     Breath sounds: Normal breath sounds. No stridor.  No decreased breath sounds, wheezing, rhonchi or rales.  Abdominal:     General: There is no distension.     Palpations: Abdomen is soft.     Tenderness: There is no abdominal tenderness. There is no rebound.  Musculoskeletal:        General: No tenderness. Normal range of motion.     Cervical back: Normal range of motion and neck supple.  Skin:    General: Skin is warm and dry.     Findings: No abrasion or rash.  Neurological:     Mental Status: She is alert and oriented to person, place, and time. Mental status is at baseline.     GCS: GCS eye subscore is 4. GCS verbal subscore is 5. GCS motor subscore is 6.     Cranial Nerves: Cranial nerves are intact. No cranial nerve deficit.     Sensory: No sensory deficit.     Motor: Motor function  is intact.  Psychiatric:        Attention and Perception: Attention normal.        Speech: Speech normal.        Behavior: Behavior normal.     ED Results / Procedures / Treatments   Labs (all labs ordered are listed, but only abnormal results are displayed) Labs Reviewed  CULTURE, BLOOD (ROUTINE X 2)  CULTURE, BLOOD (ROUTINE X 2)  SARS CORONAVIRUS 2 BY RT PCR  COMPREHENSIVE METABOLIC PANEL  CBC WITH DIFFERENTIAL/PLATELET  PROTIME-INR  APTT  URINALYSIS, W/ REFLEX TO CULTURE (INFECTION SUSPECTED)  I-STAT CG4 LACTIC ACID, ED    EKG EKG Interpretation Date/Time:  Saturday March 11 2023 13:53:21 EDT Ventricular Rate:  110 PR Interval:  183 QRS Duration:  69 QT Interval:  334 QTC Calculation: 452 R Axis:   -13  Text Interpretation: Sinus tachycardia Atrial premature complex Confirmed by Lorre Nick ([id number]) on 03/11/2023 2:01:49 PM  Radiology No results found.  Procedures Procedures    Medications Ordered in ED Medications  lactated ringers infusion (has no administration in time range)    ED Course/ Medical Decision Making/ A&P                                 Medical Decision Making Amount and/or Complexity of Data Reviewed Labs: ordered. Radiology: ordered. ECG/medicine tests: ordered.  Risk Prescription drug management.  EKG per interpretation shows sinus tachycardia. Is elevated here.  Urinalysis appears to be the source of infection.  Patient started on Rocephin 2 g IV piggyback and given 30 cc/kg of lactated Ringer's.  Test is negative here.  Patient will require inpatient admission.  Discussed with relative at bedside  CRITICAL CARE Performed by: Toy Baker Total critical care time: 45 minutes Critical care time was exclusive of separately billable procedures and treating other patients. Critical care was necessary to treat or prevent imminent or life-threatening deterioration. Critical care was time spent personally by me on the  following activities: development of treatment plan with patient and/or surrogate as well as nursing, discussions with consultants, evaluation of patient's response to treatment, examination of patient, obtaining history from patient or surrogate, ordering and performing treatments and interventions, ordering and review of laboratory studies, ordering and review of radiographic studies, pulse oximetry and re-evaluation of patient's condition.         Final Clinical Impression(s) / ED Diagnoses Final diagnoses:  None    Rx / DC Orders ED Discharge Orders  None         Lorre Nick, MD 03/11/23 1539

## 2023-03-11 NOTE — Progress Notes (Signed)
PHARMACY - ANTICOAGULATION CONSULT NOTE  Pharmacy Consult for heparin Indication: pulmonary embolus  Allergies  Allergen Reactions   Nsaids Other (See Comments)    "NSAIDS WILL CAUSE BLEEDING ULCERS"- do NOT give   Penicillins Rash and Other (See Comments)    Cephalosporin tolerant given during surgery 08/2017    Patient Measurements:   Heparin Dosing Weight: 46.4 kg (recorded weight from 10/3)  Vital Signs: Temp: 99.1 F (37.3 C) (10/12 1816) Temp Source: Oral (10/12 1816) BP: 163/95 (10/12 1348) Pulse Rate: 107 (10/12 1816)  Labs: Recent Labs    03/11/23 1420  HGB 13.6  HCT 40.9  PLT 179  APTT 27  LABPROT 15.5*  INR 1.2  CREATININE 1.26*    Estimated Creatinine Clearance: 21.3 mL/min (A) (by C-G formula based on SCr of 1.26 mg/dL (H)).   Medical History: Past Medical History:  Diagnosis Date   AKI (acute kidney injury) (HCC)    due to NSAID   Arthritis    Closed fracture of left proximal humerus 09/14/2017   Constipation    DVT (deep venous thrombosis) (HCC)    Herniated disc    Hypertension    Pulmonary embolism (HCC)    Seasonal allergies    Spinal stenosis    Stomach ulcer    due to NSAID   Tachycardia    UTI (lower urinary tract infection)    Vitamin D deficiency 09/14/2017    Medications:  Scheduled:   [START ON 03/12/2023] atorvastatin  40 mg Oral Daily   [START ON 03/12/2023] fesoterodine  4 mg Oral Daily   latanoprost  1 drop Both Eyes QHS   [START ON 03/12/2023] mirabegron ER  50 mg Oral q AM   [START ON 03/12/2023] pantoprazole  40 mg Oral Daily   sodium chloride flush  3 mL Intravenous Q12H    Assessment: 89 YO female presenting with shortness of breath, found to have a PE in her bilateral upper pulmonary arteries--no right heart strain. She is on Eliquis PTA for a history of PE but stopped taking it over a week ago (last dose 9/28) due to hematuria. Patient has reportedly not had hematuria since 10/1. Pharmacy consulted for heparin  dosing.   Today, 03/11/23 Hgb 13.6, plts 179--stable Scr 1.26, CrCl ~21 No s/sx of bleeding reported   Goal of Therapy:  Heparin level 0.3-0.7 units/ml Monitor platelets by anticoagulation protocol: Yes   Plan:  Give heparin 1400 units x1 (per [place] Calculator) Start heparin gtt @ 750 units/hr (per [place] Calculator) Check heparin level in 8 hours Monitor heparin level, CBC, and s/sx of bleeding daily    Cherylin Mylar, PharmD Clinical Pharmacist  10/12/20246:43 PM

## 2023-03-11 NOTE — ED Triage Notes (Signed)
Pt bib ems from home with complaints of shob for the last few days. On scene oxygen saturation 89%. Pt tachycardic in the 110-120's. EtCO2 16. Temp 100. Pt with hx PE, stopped eliquis week and a half ago due to hematuria. 1L LR given PTA.

## 2023-03-11 NOTE — ED Notes (Signed)
MD made aware no new orders

## 2023-03-11 NOTE — ED Notes (Signed)
ED TO INPATIENT HANDOFF REPORT  ED Nurse Name and Phone #: Tia, RN  S Name/Age/Gender Becky Leach 89 y.o. female Room/Bed: WA17/WA17  Code Status   Code Status: Prior  Home/SNF/Other Home Patient oriented to: self and place Is this baseline? Yes   Triage Complete: Triage complete  Chief Complaint Lactic acidosis [E87.20]  Triage Note Pt bib ems from home with complaints of shob for the last few days. On scene oxygen saturation 89%. Pt tachycardic in the 110-120's. EtCO2 16. Temp 100. Pt with hx PE, stopped eliquis week and a half ago due to hematuria. 1L LR given PTA.    Allergies Allergies  Allergen Reactions   Nsaids Other (See Comments)    "NSAIDS WILL CAUSE BLEEDING ULCERS"- do NOT give   Penicillins Rash and Other (See Comments)    Cephalosporin tolerant given during surgery 08/2017    Level of Care/Admitting Diagnosis ED Disposition     ED Disposition  Admit   Condition  --   Comment  Hospital Area: [facility] [[id number]]  Level of Care: Telemetry [5]  Admit to tele based on following criteria: Monitor for Ischemic changes  [facility] patient in [facility] at [facility] or [facility] if equivalent level of care is available:: Yes  Covid Evaluation: Confirmed COVID Negative  Diagnosis: Lactic acidosis [098119]  Admitting Physician: Briscoe Deutscher [[id number]]  Attending Physician: Briscoe Deutscher [[id number]]          B Medical/Surgery History Past Medical History:  Diagnosis Date   AKI (acute kidney injury) (HCC)    due to NSAID   Arthritis    Closed fracture of left proximal humerus 09/14/2017   Constipation    DVT (deep venous thrombosis) (HCC)    Herniated disc    Hypertension    Pulmonary embolism (HCC)    Seasonal allergies    Spinal stenosis    Stomach ulcer    due to NSAID   Tachycardia    UTI (lower urinary tract infection)    Vitamin D deficiency 09/14/2017   Past Surgical History:  Procedure Laterality Date    COLONOSCOPY  12/21/2011   Procedure: COLONOSCOPY;  Surgeon: [name], MD;  Location: [facility];  Service: Endoscopy;  Laterality: N/A;   DILATION AND CURETTAGE OF UTERUS     ORIF HUMERUS FRACTURE Left 09/12/2017   Procedure: OPEN REDUCTION INTERNAL FIXATION (ORIF) PROXIMAL  HUMERUS FRACTURE;  Surgeon: Myrene Galas, MD;  Location: [facility] OR;  Service: Orthopedics;  Laterality: Left;   TONSILLECTOMY AND ADENOIDECTOMY       A IV Location/Drains/Wounds Patient Lines/Drains/Airways Status     Active Line/Drains/Airways     Name Placement date Placement time Site Days   Peripheral IV 03/11/23 18 G 1" Left Antecubital 03/11/23  1400  Antecubital  less than 1   Incision (Closed) 09/12/17 Arm Left 09/12/17  1845  -- 2006   Wound 12/16/11 Other (Comment) Buttocks Bilateral moisture-associated skin breakdown, 1 small area of stage 2  to each side of rectum/posterior peri area 12/16/11  2220  Buttocks  4103            Intake/Output Last 24 hours No intake or output data in the 24 hours ending 03/11/23 1558  Labs/Imaging Results for orders placed or performed during the hospital encounter of 03/11/23 (from the past 48 hour(s))  SARS Coronavirus 2 by RT PCR (hospital order, performed in [facility] hospital lab) *cepheid single result test* Anterior Nasal Swab  Status: None   Collection Time: 03/11/23  2:01 PM   Specimen: Anterior Nasal Swab  Result Value Ref Range   SARS Coronavirus 2 by RT PCR NEGATIVE NEGATIVE    Comment: (NOTE) SARS-CoV-2 target nucleic acids are NOT DETECTED.  The SARS-CoV-2 RNA is generally detectable in upper and lower respiratory specimens during the acute phase of infection. The lowest concentration of SARS-CoV-2 viral copies this assay can detect is 250 copies / mL. A negative result does not preclude SARS-CoV-2 infection and should not be used as the sole basis for treatment or other patient management decisions.  A negative result may occur  with improper specimen collection / handling, submission of specimen other than nasopharyngeal swab, presence of viral mutation(s) within the areas targeted by this assay, and inadequate number of viral copies (<250 copies / mL). A negative result must be combined with clinical observations, patient history, and epidemiological information.  Fact Sheet for Patients:   RoadLapTop.co.za  Fact Sheet for Healthcare Providers: http://kim-miller.com/  This test is not yet approved or  cleared by the [place] FDA and has been authorized for detection and/or diagnosis of SARS-CoV-2 by FDA under an Emergency Use Authorization (EUA).  This EUA will remain in effect (meaning this test can be used) for the duration of the COVID-19 declaration under Section 564(b)(1) of the Act, 21 U.S.C. section 360bbb-3(b)(1), unless the authorization is terminated or revoked sooner.  Performed at [facility], [address], [place], [place] [address]   Comprehensive metabolic panel     Status: Abnormal   Collection Time: 03/11/23  2:20 PM  Result Value Ref Range   Sodium 140 135 - 145 mmol/L   Potassium 4.3 3.5 - 5.1 mmol/L   Chloride 106 98 - 111 mmol/L   CO2 24 22 - 32 mmol/L   Glucose, Bld 130 (H) 70 - 99 mg/dL    Comment: Glucose reference range applies only to samples taken after fasting for at least 8 hours.   BUN 21 8 - 23 mg/dL   Creatinine, Ser 6.04 (H) 0.44 - 1.00 mg/dL   Calcium 9.1 8.9 - 54.0 mg/dL   Total Protein 6.9 6.5 - 8.1 g/dL   Albumin 3.2 (L) 3.5 - 5.0 g/dL   AST 20 15 - 41 U/L   ALT 20 0 - 44 U/L   Alkaline Phosphatase 74 38 - 126 U/L   Total Bilirubin 0.8 0.3 - 1.2 mg/dL   GFR, Estimated 40 (L) >60 mL/min    Comment: (NOTE) Calculated using the CKD-EPI Creatinine Equation (2021)    Anion gap 10 5 - 15    Comment: Performed at [facility], [address], [place], [place] [address]  CBC  with Differential     Status: None   Collection Time: 03/11/23  2:20 PM  Result Value Ref Range   WBC 5.9 4.0 - 10.5 K/uL   RBC 4.65 3.87 - 5.11 MIL/uL   Hemoglobin 13.6 12.0 - 15.0 g/dL   HCT 14.7 82.9 - 56.2 %   MCV 88.0 80.0 - 100.0 fL   MCH 29.2 26.0 - 34.0 pg   MCHC 33.3 30.0 - 36.0 g/dL   RDW 13.0 86.5 - 78.4 %   Platelets 179 150 - 400 K/uL   nRBC 0.0 0.0 - 0.2 %   Neutrophils Relative % 71 %   Neutro Abs 4.2 1.7 - 7.7 K/uL   Lymphocytes Relative 17 %   Lymphs Abs 1.0 0.7 - 4.0  K/uL   Monocytes Relative 11 %   Monocytes Absolute 0.7 0.1 - 1.0 K/uL   Eosinophils Relative 1 %   Eosinophils Absolute 0.1 0.0 - 0.5 K/uL   Basophils Relative 0 %   Basophils Absolute 0.0 0.0 - 0.1 K/uL   Immature Granulocytes 0 %   Abs Immature Granulocytes 0.02 0.00 - 0.07 K/uL    Comment: Performed at [facility], [address], [place], [place] [address]  Protime-INR     Status: Abnormal   Collection Time: 03/11/23  2:20 PM  Result Value Ref Range   Prothrombin Time 15.5 (H) 11.4 - 15.2 seconds   INR 1.2 0.8 - 1.2    Comment: (NOTE) INR goal varies based on device and disease states. Performed at [facility], [address], [place], [place] [address]   APTT     Status: None   Collection Time: 03/11/23  2:20 PM  Result Value Ref Range   aPTT 27 24 - 36 seconds    Comment: Performed at [facility], [address], [place], [place] [address]  Urinalysis, w/ Reflex to Culture (Infection Suspected) -Urine, Clean Catch     Status: Abnormal   Collection Time: 03/11/23  2:47 PM  Result Value Ref Range   Specimen Source URINE, CLEAN CATCH    Color, Urine YELLOW YELLOW   APPearance HAZY (A) CLEAR   Specific Gravity, Urine 1.010 1.005 - 1.030   pH 7.0 5.0 - 8.0   Glucose, UA NEGATIVE NEGATIVE mg/dL   Hgb urine dipstick NEGATIVE NEGATIVE   Bilirubin Urine NEGATIVE NEGATIVE   Ketones, ur NEGATIVE NEGATIVE mg/dL   Protein, ur  NEGATIVE NEGATIVE mg/dL   Nitrite NEGATIVE NEGATIVE   Leukocytes,Ua LARGE (A) NEGATIVE   RBC / HPF 11-20 0 - 5 RBC/hpf   WBC, UA >50 0 - 5 WBC/hpf    Comment:        Reflex urine culture not performed if WBC <=10, OR if Squamous epithelial cells >5. If Squamous epithelial cells >5 suggest recollection.    Bacteria, UA RARE (A) NONE SEEN   Squamous Epithelial / HPF 0-5 0 - 5 /HPF   Mucus PRESENT    Budding Yeast PRESENT     Comment: Performed at [facility], [address], [place], [place] [address]  I-Stat Lactic Acid, ED     Status: Abnormal   Collection Time: 03/11/23  2:49 PM  Result Value Ref Range   Lactic Acid, Venous 3.1 (HH) 0.5 - 1.9 mmol/L   Comment NOTIFIED PHYSICIAN    DG Chest Port 1 View  Result Date: 03/11/2023 CLINICAL DATA:  Shortness of breath. Evaluate for abnormality. Sepsis. EXAM: PORTABLE CHEST 1 VIEW COMPARISON:  09/11/2017. FINDINGS: Cardiomegaly. Aortic atherosclerotic calcification. Bibasilar atelectasis. Trace pleural effusions or pleural thickening. No pneumothorax. Postoperative change left humerus. Large hiatal hernia IMPRESSION: No acute abnormality.  Cardiomegaly. Large hiatal hernia. Electronically Signed   By: Minerva Fester M.D.   On: 03/11/2023 15:48    Pending Labs Unresulted Labs (From admission, onward)     Start     Ordered   03/11/23 1447  Urine Culture  Once,   R        03/11/23 1447   03/11/23 1400  Blood Culture (routine x 2)  (Undifferentiated presentation (screening labs and basic nursing orders))  BLOOD CULTURE X 2,   STAT      03/11/23 1359  Vitals/Pain Today's Vitals   03/11/23 1348 03/11/23 1352 03/11/23 1353 03/11/23 1428  BP: (!) 163/95     Pulse: (!) 108  (!) 109   Resp:   12   Temp:    99.7 F (37.6 C)  TempSrc:    Rectal  SpO2: 97%  92%   PainSc:  5       Isolation Precautions Airborne and Contact precautions  Medications Medications  lactated ringers infusion (  Intravenous New Bag/Given 03/11/23 1427)  cefTRIAXone (ROCEPHIN) 2 g in sodium chloride 0.9 % 100 mL IVPB (2 g Intravenous New Bag/Given 03/11/23 1548)  lactated ringers bolus 1,392 mL (1,392 mLs Intravenous New Bag/Given 03/11/23 1549)    Mobility walks with device     Focused Assessments Neuro Assessment Handoff:  Swallow screen pass?  N/A         Neuro Assessment:   Neuro Checks:      Has TPA been given? No If patient is a Neuro Trauma and patient is going to OR before floor call report to 4N Charge nurse: [phone] or [phone]   R Recommendations: See Admitting Provider Note  Report given to:   Additional Notes: HOH and incontinent

## 2023-03-12 ENCOUNTER — Other Ambulatory Visit (HOSPITAL_COMMUNITY): Payer: Medicare Other

## 2023-03-12 ENCOUNTER — Inpatient Hospital Stay (HOSPITAL_COMMUNITY): Payer: Medicare Other

## 2023-03-12 DIAGNOSIS — N1832 Chronic kidney disease, stage 3b: Secondary | ICD-10-CM | POA: Diagnosis present

## 2023-03-12 DIAGNOSIS — I2699 Other pulmonary embolism without acute cor pulmonale: Secondary | ICD-10-CM

## 2023-03-12 DIAGNOSIS — Z8249 Family history of ischemic heart disease and other diseases of the circulatory system: Secondary | ICD-10-CM | POA: Diagnosis not present

## 2023-03-12 DIAGNOSIS — E559 Vitamin D deficiency, unspecified: Secondary | ICD-10-CM | POA: Diagnosis present

## 2023-03-12 DIAGNOSIS — Z886 Allergy status to analgesic agent status: Secondary | ICD-10-CM | POA: Diagnosis not present

## 2023-03-12 DIAGNOSIS — I824Y1 Acute embolism and thrombosis of unspecified deep veins of right proximal lower extremity: Secondary | ICD-10-CM | POA: Diagnosis not present

## 2023-03-12 DIAGNOSIS — E872 Acidosis, unspecified: Secondary | ICD-10-CM | POA: Diagnosis present

## 2023-03-12 DIAGNOSIS — I82431 Acute embolism and thrombosis of right popliteal vein: Secondary | ICD-10-CM | POA: Diagnosis present

## 2023-03-12 DIAGNOSIS — J9600 Acute respiratory failure, unspecified whether with hypoxia or hypercapnia: Secondary | ICD-10-CM | POA: Diagnosis not present

## 2023-03-12 DIAGNOSIS — Z1152 Encounter for screening for COVID-19: Secondary | ICD-10-CM | POA: Diagnosis not present

## 2023-03-12 DIAGNOSIS — Z880 Allergy status to penicillin: Secondary | ICD-10-CM | POA: Diagnosis not present

## 2023-03-12 DIAGNOSIS — R2689 Other abnormalities of gait and mobility: Secondary | ICD-10-CM | POA: Diagnosis not present

## 2023-03-12 DIAGNOSIS — J9601 Acute respiratory failure with hypoxia: Secondary | ICD-10-CM | POA: Diagnosis present

## 2023-03-12 DIAGNOSIS — M6259 Muscle wasting and atrophy, not elsewhere classified, multiple sites: Secondary | ICD-10-CM | POA: Diagnosis not present

## 2023-03-12 DIAGNOSIS — R531 Weakness: Secondary | ICD-10-CM | POA: Diagnosis not present

## 2023-03-12 DIAGNOSIS — I129 Hypertensive chronic kidney disease with stage 1 through stage 4 chronic kidney disease, or unspecified chronic kidney disease: Secondary | ICD-10-CM | POA: Diagnosis present

## 2023-03-12 DIAGNOSIS — E8809 Other disorders of plasma-protein metabolism, not elsewhere classified: Secondary | ICD-10-CM | POA: Diagnosis present

## 2023-03-12 DIAGNOSIS — R0602 Shortness of breath: Secondary | ICD-10-CM | POA: Diagnosis present

## 2023-03-12 DIAGNOSIS — N3000 Acute cystitis without hematuria: Secondary | ICD-10-CM | POA: Diagnosis not present

## 2023-03-12 DIAGNOSIS — Z86711 Personal history of pulmonary embolism: Secondary | ICD-10-CM | POA: Diagnosis not present

## 2023-03-12 DIAGNOSIS — Z1629 Resistance to other single specified antibiotic: Secondary | ICD-10-CM | POA: Diagnosis present

## 2023-03-12 DIAGNOSIS — M6281 Muscle weakness (generalized): Secondary | ICD-10-CM | POA: Diagnosis not present

## 2023-03-12 DIAGNOSIS — I82421 Acute embolism and thrombosis of right iliac vein: Secondary | ICD-10-CM | POA: Diagnosis present

## 2023-03-12 DIAGNOSIS — M1990 Unspecified osteoarthritis, unspecified site: Secondary | ICD-10-CM | POA: Diagnosis present

## 2023-03-12 DIAGNOSIS — R310 Gross hematuria: Secondary | ICD-10-CM | POA: Diagnosis not present

## 2023-03-12 DIAGNOSIS — I82411 Acute embolism and thrombosis of right femoral vein: Secondary | ICD-10-CM | POA: Diagnosis present

## 2023-03-12 DIAGNOSIS — Z8261 Family history of arthritis: Secondary | ICD-10-CM | POA: Diagnosis not present

## 2023-03-12 DIAGNOSIS — Z7401 Bed confinement status: Secondary | ICD-10-CM | POA: Diagnosis not present

## 2023-03-12 DIAGNOSIS — Z8711 Personal history of peptic ulcer disease: Secondary | ICD-10-CM | POA: Diagnosis not present

## 2023-03-12 DIAGNOSIS — R911 Solitary pulmonary nodule: Secondary | ICD-10-CM

## 2023-03-12 DIAGNOSIS — Z7901 Long term (current) use of anticoagulants: Secondary | ICD-10-CM | POA: Diagnosis not present

## 2023-03-12 DIAGNOSIS — B964 Proteus (mirabilis) (morganii) as the cause of diseases classified elsewhere: Secondary | ICD-10-CM | POA: Diagnosis present

## 2023-03-12 DIAGNOSIS — M4986 Spondylopathy in diseases classified elsewhere, lumbar region: Secondary | ICD-10-CM | POA: Diagnosis not present

## 2023-03-12 DIAGNOSIS — R000 Tachycardia, unspecified: Secondary | ICD-10-CM | POA: Diagnosis present

## 2023-03-12 DIAGNOSIS — R1314 Dysphagia, pharyngoesophageal phase: Secondary | ICD-10-CM | POA: Diagnosis not present

## 2023-03-12 DIAGNOSIS — N3001 Acute cystitis with hematuria: Secondary | ICD-10-CM | POA: Diagnosis present

## 2023-03-12 DIAGNOSIS — R1311 Dysphagia, oral phase: Secondary | ICD-10-CM | POA: Diagnosis not present

## 2023-03-12 LAB — CBC
HCT: 41.8 % (ref 36.0–46.0)
Hemoglobin: 13.8 g/dL (ref 12.0–15.0)
MCH: 29.5 pg (ref 26.0–34.0)
MCHC: 33 g/dL (ref 30.0–36.0)
MCV: 89.3 fL (ref 80.0–100.0)
Platelets: 175 10*3/uL (ref 150–400)
RBC: 4.68 MIL/uL (ref 3.87–5.11)
RDW: 14.4 % (ref 11.5–15.5)
WBC: 7.6 10*3/uL (ref 4.0–10.5)
nRBC: 0 % (ref 0.0–0.2)

## 2023-03-12 LAB — BASIC METABOLIC PANEL
Anion gap: 11 (ref 5–15)
BUN: 13 mg/dL (ref 8–23)
CO2: 23 mmol/L (ref 22–32)
Calcium: 8.9 mg/dL (ref 8.9–10.3)
Chloride: 105 mmol/L (ref 98–111)
Creatinine, Ser: 0.98 mg/dL (ref 0.44–1.00)
GFR, Estimated: 54 mL/min — ABNORMAL LOW (ref >60)
Glucose, Bld: 96 mg/dL (ref 70–99)
Potassium: 4 mmol/L (ref 3.5–5.1)
Sodium: 139 mmol/L (ref 135–145)

## 2023-03-12 LAB — HEPARIN LEVEL (UNFRACTIONATED)
Heparin Unfractionated: 0.53 [IU]/mL (ref 0.30–0.70)
Heparin Unfractionated: 0.47 [IU]/mL (ref 0.30–0.70)

## 2023-03-12 MED ORDER — VITAMIN D3 25 MCG (1000 UNIT) PO TABS
1000.0000 [IU] | ORAL_TABLET | Freq: Every day | ORAL | Status: DC
  Administered 2023-03-12 – 2023-03-15 (×4): 1000 [IU] via ORAL
  Filled 2023-03-12 (×4): qty 1

## 2023-03-12 MED ORDER — AMLODIPINE BESYLATE 5 MG PO TABS
2.5000 mg | ORAL_TABLET | Freq: Every day | ORAL | Status: DC
  Administered 2023-03-12 – 2023-03-15 (×4): 2.5 mg via ORAL
  Filled 2023-03-12 (×4): qty 1

## 2023-03-12 MED ORDER — LORATADINE 10 MG PO TABS
10.0000 mg | ORAL_TABLET | Freq: Every day | ORAL | Status: DC
  Administered 2023-03-12 – 2023-03-15 (×4): 10 mg via ORAL
  Filled 2023-03-12 (×4): qty 1

## 2023-03-12 MED ORDER — TROLAMINE SALICYLATE 10 % EX LOTN: TOPICAL_LOTION | CUTANEOUS | Status: DC | PRN

## 2023-03-12 NOTE — Progress Notes (Signed)
BLE venous duplex has been completed.  Preliminary results given to Dr. Janee Morn.    Results can be found under chart review under CV PROC. 03/12/2023 5:28 PM Sunaina Ferrando RVT, RDMS

## 2023-03-12 NOTE — Plan of Care (Signed)
  Problem: Clinical Measurements: Goal: Diagnostic test results will improve Outcome: Progressing Goal: Respiratory complications will improve Outcome: Progressing   Problem: Activity: Goal: Risk for activity intolerance will decrease Outcome: Progressing   Problem: Nutrition: Goal: Adequate nutrition will be maintained Outcome: Progressing   Problem: Pain Managment: Goal: General experience of comfort will improve Outcome: Progressing   Problem: Safety: Goal: Ability to remain free from injury will improve Outcome: Progressing

## 2023-03-12 NOTE — Progress Notes (Signed)
PHARMACY - ANTICOAGULATION CONSULT NOTE  Pharmacy Consult for heparin Indication: pulmonary embolus  Allergies  Allergen Reactions   Nsaids Other (See Comments)    "NSAIDS WILL CAUSE BLEEDING ULCERS"- do NOT give   Penicillins Rash and Other (See Comments)    Cephalosporin tolerant given during surgery 08/2017    Patient Measurements: Height: 5\' 5"  (165.1 cm) Weight: 45.8 kg (100 lb 15.5 oz) IBW/kg (Calculated) : 57 Heparin Dosing Weight: TBW  Vital Signs: Temp: 98 F (36.7 C) (10/13 1257) Temp Source: Oral (10/13 1257) BP: 111/89 (10/13 1257) Pulse Rate: 96 (10/13 1257)  Labs: Recent Labs    03/11/23 1420 03/12/23 0458 03/12/23 1235  HGB 13.6 13.8  --   HCT 40.9 41.8  --   PLT 179 175  --   APTT 27  --   --   LABPROT 15.5*  --   --   INR 1.2  --   --   HEPARINUNFRC  --  0.53 0.47  CREATININE 1.26* 0.98  --     Estimated Creatinine Clearance: 27 mL/min (by C-G formula based on SCr of 0.98 mg/dL).   Medical History: Past Medical History:  Diagnosis Date   AKI (acute kidney injury) (HCC)    due to NSAID   Arthritis    Closed fracture of left proximal humerus 09/14/2017   Constipation    DVT (deep venous thrombosis) (HCC)    Herniated disc    Hypertension    Pulmonary embolism (HCC)    Seasonal allergies    Spinal stenosis    Stomach ulcer    due to NSAID   Tachycardia    UTI (lower urinary tract infection)    Vitamin D deficiency 09/14/2017    Medications:  Scheduled:   amLODipine  2.5 mg Oral Daily   atorvastatin  40 mg Oral Daily   cholecalciferol  1,000 Units Oral Daily   feeding supplement  237 mL Oral BID BM   fesoterodine  4 mg Oral QHS   latanoprost  1 drop Both Eyes QHS   loratadine  10 mg Oral Daily   mirabegron ER  50 mg Oral Daily   pantoprazole  40 mg Oral Daily   sodium chloride flush  3 mL Intravenous Q12H    Assessment: 89 YO female presenting with shortness of breath, found to have a PE in her bilateral upper pulmonary  arteries--no right heart strain. She is on Eliquis PTA for a history of PE but stopped taking it over a week ago (last dose 9/28) due to hematuria. Patient has reportedly not had hematuria since 10/1. Pharmacy consulted for heparin dosing.   Today, 03/12/23 Heparin level 0.47--therapeutic on IV heparin 750 units/hr Hgb 13.8, plts 175--stable Scr 0.98--improving No s/sx of bleeding or infusion related concerns reported  Goal of Therapy:  Heparin level 0.3-0.7 units/ml Monitor platelets by anticoagulation protocol: Yes   Plan:  Continue heparin gtt @ 750 units/hr  Monitor heparin level, CBC, and s/sx of bleeding daily F/u transition to PO anticoagulation   Cherylin Mylar, PharmD Clinical Pharmacist  10/13/20241:29 PM

## 2023-03-12 NOTE — Progress Notes (Signed)
PROGRESS NOTE    Becky Leach  YNW:[id number] DOB: 1930-03-06 DOA: 03/11/2023 PCP: Erick Alley, DO    No chief complaint on file.   Brief Narrative:  Patient pleasant 89 year old female history of spinal stenosis, CKD 3B, unprovoked PE in 2018 was on chronic anticoagulation, recent hematuria for which patient has been off Eliquis for approximately 2 weeks presenting to the ED with shortness of breath.  Patient noted to have stopped Eliquis on 02/25/2023 and has not had any gross hematuria since 02/28/2023 however became more short of breath.  Patient with nonproductive cough.  Patient denies any change in chronic leg swelling.  Patient also endorsed dysuria.  Urinalysis concerning for UTI.  Patient seen in the ED EKG with a sinus tachycardia with PACs, chest x-ray negative for any acute findings, CT angiogram chest done positive for bilateral upper PE.  Patient admitted, placed on IV heparin, 2D echo and lower extremity Dopplers ordered.   Assessment & Plan:   Principal Problem:   Acute respiratory failure with hypoxia (HCC) Active Problems:   History of pulmonary embolus (PE)   CKD (chronic kidney disease) stage 3, GFR 30-59 ml/min (HCC)   Hematuria   Lactic acidosis   Acute cystitis   Acute pulmonary embolism (HCC)   Lung nodule   Acute deep vein thrombosis (DVT) of proximal vein of right lower extremity (HCC)  #1 acute respiratory failure with hypoxia secondary to acute bilateral PE/right lower extremity DVT -Patient presenting with acute shortness of breath after discontinuation of Eliquis 02/25/2023 due to hematuria. -Patient noted with no further hematuria 02/28/2023. -Chest x-ray done negative for any acute abnormalities. -CT angiogram chest done with bilateral upper pulmonary artery PE without right Leach strain. -Patient with clinical improvement after being started on IV heparin. -Check a 2D echo. -Lower extremity Dopplers ordered this morning per ultrasound tech with RLE  DVT in distal EIV, CFV, SFJ, FV, PFV, popliteal vein. -Continue IV heparin.  2.  UTI -Patient presented with urinary symptoms of dysuria. -Urinalysis concerning for UTI. -Urine cultures pending. -Continue IV Rocephin.  3.  CKD stage IIIb -Stable.  4.  Gross hematuria -No further gross hematuria after stopping Eliquis. -Monitor closely on IV heparin. -Will need to reschedule outpatient follow-up appointment with urology.  5.  Lung nodule -Incidentally noted on CT scan. -Outpatient follow-up.   DVT prophylaxis: Heparin Code Status: Full Family Communication: Updated patient, son at bedside. Disposition: Likely home when clinically improved.  Status is: Inpatient Remains inpatient appropriate because: Severity of illness   Consultants:  None  Procedures: CT angiogram chest 03/11/2023 Bilateral lower extremity Dopplers  Antimicrobials:  Anti-infectives (From admission, onward)    Start     Dose/Rate Route Frequency Ordered Stop   03/11/23 1530  cefTRIAXone (ROCEPHIN) 2 g in sodium chloride 0.9 % 100 mL IVPB        2 g 200 mL/hr over 30 Minutes Intravenous Every 24 hours 03/11/23 1529            Subjective: Patient laying in bed.  Denies any shortness of breath at rest.  No chest pain, no abdominal pain.  States some improvement with dysuria since admission.  Denies any bleeding.  Son at bedside.  Per son anticoagulation discontinued 2 weeks ago with onset of hematuria which has since resolved.  Objective: Vitals:   03/12/23 0614 03/12/23 0614 03/12/23 1030 03/12/23 1257  BP: (!) 153/115 (!) 153/115 126/83 111/89  Pulse: (!) 101 99 100 96  Resp: 20 20 (!) 24 (!)  24  Temp: 98.3 F (36.8 C) 98.3 F (36.8 C) 98 F (36.7 C) 98 F (36.7 C)  TempSrc: Oral Oral Oral Oral  SpO2: 95% 98% 97% 94%  Weight:      Height:        Intake/Output Summary (Last 24 hours) at 03/12/2023 1722 Last data filed at 03/12/2023 1454 Gross per 24 hour  Intake 2678.56 ml  Output  1900 ml  Net 778.56 ml   Filed Weights   03/11/23 2229  Weight: 45.8 kg    Examination:  General exam: Appears calm and comfortable  Respiratory system: Clear to auscultation.  No wheezes, no crackles, no rhonchi.  Fair air movement.  Speaking in full sentences.  Respiratory effort normal. Cardiovascular system: S1 & S2 heard, RRR. No JVD, murmurs, rubs, gallops or clicks. No pitting lower extremity edema.   Gastrointestinal system: Abdomen is nondistended, soft and nontender. No organomegaly or masses felt. Normal bowel sounds heard. Central nervous system: Alert and oriented. No focal neurological deficits. Extremities: Symmetric 5 x 5 power. Skin: No rashes, lesions or ulcers Psychiatry: Judgement and insight appear normal. Mood & affect appropriate.     Data Reviewed: I have personally reviewed following labs and imaging studies  CBC: Recent Labs  Lab 03/11/23 1420 03/12/23 0458  WBC 5.9 7.6  NEUTROABS 4.2  --   HGB 13.6 13.8  HCT 40.9 41.8  MCV 88.0 89.3  PLT 179 175    Basic Metabolic Panel: Recent Labs  Lab 03/11/23 1420 03/12/23 0458  NA 140 139  K 4.3 4.0  CL 106 105  CO2 24 23  GLUCOSE 130* 96  BUN 21 13  CREATININE 1.26* 0.98  CALCIUM 9.1 8.9    GFR: Estimated Creatinine Clearance: 27 mL/min (by C-G formula based on SCr of 0.98 mg/dL).  Liver Function Tests: Recent Labs  Lab 03/11/23 1420  AST 20  ALT 20  ALKPHOS 74  BILITOT 0.8  PROT 6.9  ALBUMIN 3.2*    CBG: No results for input(s): "GLUCAP" in the last 168 hours.   Recent Results (from the past 240 hour(s))  Urine Culture     Status: Abnormal   Collection Time: 03/06/23  5:28 PM   Specimen: Urine   UC  Result Value Ref Range Status   Urine Culture, Routine Final report (A)  Final   Organism ID, Bacteria Proteus mirabilis (A)  Final    Comment: Cefazolin <=4 ug/mL Cefazolin with an MIC <=16 predicts susceptibility to the oral agents cefaclor, cefdinir, cefpodoxime,  cefprozil, cefuroxime, cephalexin, and loracarbef when used for therapy of uncomplicated urinary tract infections due to E. coli, Klebsiella pneumoniae, and Proteus mirabilis. Greater than 100,000 colony forming units per mL    Antimicrobial Susceptibility Comment  Final    Comment:       ** S = Susceptible; I = Intermediate; R = Resistant **                    P = Positive; N = Negative             MICS are expressed in micrograms per mL    Antibiotic                 RSLT#1    RSLT#2    RSLT#3    RSLT#4 Amoxicillin/Clavulanic Acid    S Ampicillin                     S Cefepime  S Ceftriaxone                    S Cefuroxime                     S Ciprofloxacin                  S Ertapenem                      S Gentamicin                     S Levofloxacin                   S Meropenem                      S Nitrofurantoin                 R Piperacillin/Tazobactam        S Tetracycline                   R Tobramycin                     S Trimethoprim/Sulfa             S   SARS Coronavirus 2 by RT PCR (hospital order, performed in [facility] hospital lab) *cepheid single result test* Anterior Nasal Swab     Status: None   Collection Time: 03/11/23  2:01 PM   Specimen: Anterior Nasal Swab  Result Value Ref Range Status   SARS Coronavirus 2 by RT PCR NEGATIVE NEGATIVE Final    Comment: (NOTE) SARS-CoV-2 target nucleic acids are NOT DETECTED.  The SARS-CoV-2 RNA is generally detectable in upper and lower respiratory specimens during the acute phase of infection. The lowest concentration of SARS-CoV-2 viral copies this assay can detect is 250 copies / mL. A negative result does not preclude SARS-CoV-2 infection and should not be used as the sole basis for treatment or other patient management decisions.  A negative result may occur with improper specimen collection / handling, submission of specimen other than nasopharyngeal swab, presence of viral  mutation(s) within the areas targeted by this assay, and inadequate number of viral copies (<250 copies / mL). A negative result must be combined with clinical observations, patient history, and epidemiological information.  Fact Sheet for Patients:   RoadLapTop.co.za  Fact Sheet for Healthcare Providers: http://kim-miller.com/  This test is not yet approved or  cleared by the [place] FDA and has been authorized for detection and/or diagnosis of SARS-CoV-2 by FDA under an Emergency Use Authorization (EUA).  This EUA will remain in effect (meaning this test can be used) for the duration of the COVID-19 declaration under Section 564(b)(1) of the Act, 21 U.S.C. section 360bbb-3(b)(1), unless the authorization is terminated or revoked sooner.  Performed at [facility], [address] [place], [place] [address]   Blood Culture (routine x 2)     Status: None (Preliminary result)   Collection Time: 03/11/23  2:20 PM   Specimen: BLOOD  Result Value Ref Range Status   Specimen Description   Final    BLOOD LEFT ANTECUBITAL Performed at [facility], [address] [place], [place] [address]    Special Requests   Final    BOTTLES DRAWN AEROBIC AND ANAEROBIC Blood Culture adequate volume Performed  at [facility], [address] [place], [place] [address]    Culture   Final    NO GROWTH < 24 HOURS Performed at [facility], [address], [place], [place] [address]    Report Status PENDING  Incomplete  Urine Culture     Status: Abnormal (Preliminary result)   Collection Time: 03/11/23  2:47 PM   Specimen: Urine, Random  Result Value Ref Range Status   Specimen Description   Final    URINE, RANDOM Performed at [facility], [address] [place], [place] [address]    Special Requests   Final    NONE Reflexed from [id number] Performed at [facility], [address] [place], [place] [address]    Culture (A)  Final    >=100,000 COLONIES/mL PROTEUS MIRABILIS SUSCEPTIBILITIES TO FOLLOW Performed at [facility], [address], [place], [place] [address]    Report Status PENDING  Incomplete  Blood Culture (routine x 2)     Status: None (Preliminary result)   Collection Time: 03/11/23  3:52 PM   Specimen: BLOOD  Result Value Ref Range Status   Specimen Description   Final    BLOOD RIGHT ANTECUBITAL Performed at [facility], [address], [place], [place] [address]    Special Requests   Final    BOTTLES DRAWN AEROBIC AND ANAEROBIC Blood Culture adequate volume Performed at [facility], [address] [place], [place] [address]    Culture   Final    NO GROWTH < 12 HOURS Performed at [facility], [address], [place], [place] [address]    Report Status PENDING  Incomplete         Radiology Studies: CT Angio Chest Pulmonary Embolism (PE) W or WO Contrast  Result Date: 03/11/2023 CLINICAL DATA:  Shortness of breath for the last few days. Oxygen saturation is 89%. PE suspected EXAM: CT ANGIOGRAPHY CHEST WITH CONTRAST TECHNIQUE: Multidetector CT imaging of the chest was performed using the standard protocol during bolus administration of intravenous contrast. Multiplanar CT image reconstructions and MIPs were obtained to evaluate the vascular anatomy. RADIATION DOSE REDUCTION: This exam was performed according to the departmental dose-optimization program which includes automated exposure control, adjustment of the mA and/or kV according to patient size and/or use of iterative reconstruction technique. CONTRAST:  75mL OMNIPAQUE IOHEXOL 350 MG/ML SOLN COMPARISON:  Chest radiograph earlier today and CTA chest 05/11/2017 FINDINGS: Cardiovascular: Acute pulmonary embolism in the distal right main pulmonary artery extending into the right upper lobe segmental  branches. Additional pulmonary emboli in the left upper lobe pulmonary artery. Respiratory motion obscures the segmental and subsegmental branches. No evidence of right Leach strain (RV/LV ratio: 1.0). No pericardial effusion. Cardiomegaly. Mediastinum/Nodes: Large hiatal hernia. Trachea is unremarkable. No definite adenopathy. Lungs/Pleura: Respiratory motion obscures fine detail. Compressive atelectasis in the left lower lobe due to the large hiatal hernia. Additional atelectasis or infiltrates in the right lower lobe and middle lobe. subpleural irregular part solid and ground-glass nodule in the posterior right upper lobe measuring 2.9 x 2.1 cm. The solid component measures approximately 6 mm. Upper Abdomen: No acute abnormality. Musculoskeletal: No acute fracture. Review of the MIP images confirms the above findings. IMPRESSION: 1. Acute pulmonary emboli in the bilateral upper lobe pulmonary arteries. No evidence of right Leach strain (RV/LV ratio: 1.0). 2. Large hiatal hernia with compressive atelectasis in the left lower lobe. Additional atelectasis or  infiltrates in the right lower lobe and middle lobe. 3. Subpleural irregular subsolid nodule in the posterior right upper lobe measuring 2.9 x 2.1 cm. Follow-up non-contrast CT recommended at 3-6 months to confirm persistence. If unchanged, and solid component remains <6 mm, annual CT is recommended until 5 years of stability has been established. If persistent these nodules should be considered highly suspicious if the solid component of the nodule is 6 mm or greater in size and enlarging. This recommendation follows the consensus statement: Guidelines for Management of Incidental Pulmonary Nodules Detected on CT Images: From the Fleischner Society 2017; Radiology 2017; 284:228-243. Critical Value/emergent results were called by telephone at the time of interpretation on 03/11/2023 at 6:27 pm to provider TIMOTHY OPYD , who verbally acknowledged these results.  Electronically Signed   By: Minerva Fester M.D.   On: 03/11/2023 18:39   DG Chest Port 1 View  Result Date: 03/11/2023 CLINICAL DATA:  Shortness of breath. Evaluate for abnormality. Sepsis. EXAM: PORTABLE CHEST 1 VIEW COMPARISON:  09/11/2017. FINDINGS: Cardiomegaly. Aortic atherosclerotic calcification. Bibasilar atelectasis. Trace pleural effusions or pleural thickening. No pneumothorax. Postoperative change left humerus. Large hiatal hernia IMPRESSION: No acute abnormality.  Cardiomegaly. Large hiatal hernia. Electronically Signed   By: Minerva Fester M.D.   On: 03/11/2023 15:48        Scheduled Meds:  amLODipine  2.5 mg Oral Daily   atorvastatin  40 mg Oral Daily   cholecalciferol  1,000 Units Oral Daily   feeding supplement  237 mL Oral BID BM   fesoterodine  4 mg Oral QHS   latanoprost  1 drop Both Eyes QHS   loratadine  10 mg Oral Daily   mirabegron ER  50 mg Oral Daily   pantoprazole  40 mg Oral Daily   sodium chloride flush  3 mL Intravenous Q12H   Continuous Infusions:  cefTRIAXone (ROCEPHIN)  IV 2 g (03/12/23 1548)   heparin 750 Units/hr (03/11/23 2009)     LOS: 0 days    Time spent: 40 minutes    Ramiro Harvest, MD [facility]   To contact the attending provider between 7A-7P or the covering provider during after hours 7P-7A, please log into the web site [email or web address] and access using universal [place] password for that web site. If you do not have the password, please call the hospital operator.  03/12/2023, 5:22 PM

## 2023-03-12 NOTE — Progress Notes (Signed)
OT Cancellation Note  Patient Details Name: Becky Leach MRN: [id number] DOB: 09-15-1929   Cancelled Treatment:    Reason Eval/Treat Not Completed: Medical issues which prohibited therapy Patient started Heparin at 6 pm on 10/12. OT to continue to follow and check back after 24 hour window when patient is at therapeutic level.  Rosalio Loud, MS Acute Rehabilitation Department Office# [phone]  03/12/2023, 3:15 PM

## 2023-03-12 NOTE — Progress Notes (Signed)
PHARMACY - ANTICOAGULATION CONSULT NOTE  Pharmacy Consult for heparin Indication: pulmonary embolus  Allergies  Allergen Reactions   Nsaids Other (See Comments)    "NSAIDS WILL CAUSE BLEEDING ULCERS"- do NOT give   Penicillins Rash and Other (See Comments)    Cephalosporin tolerant given during surgery 08/2017    Patient Measurements: Height: 5\' 5"  (165.1 cm) Weight: 45.8 kg (100 lb 15.5 oz) IBW/kg (Calculated) : 57 Heparin Dosing Weight: TBW  Vital Signs: Temp: 98.3 F (36.8 C) (10/13 0614) Temp Source: Oral (10/13 0614) BP: 153/115 (10/13 0614) Pulse Rate: 99 (10/13 0614)  Labs: Recent Labs    03/11/23 1420 03/12/23 0458  HGB 13.6 13.8  HCT 40.9 41.8  PLT 179 175  APTT 27  --   LABPROT 15.5*  --   INR 1.2  --   HEPARINUNFRC  --  0.53  CREATININE 1.26* 0.98    Estimated Creatinine Clearance: 27 mL/min (by C-G formula based on SCr of 0.98 mg/dL).   Medical History: Past Medical History:  Diagnosis Date   AKI (acute kidney injury) (HCC)    due to NSAID   Arthritis    Closed fracture of left proximal humerus 09/14/2017   Constipation    DVT (deep venous thrombosis) (HCC)    Herniated disc    Hypertension    Pulmonary embolism (HCC)    Seasonal allergies    Spinal stenosis    Stomach ulcer    due to NSAID   Tachycardia    UTI (lower urinary tract infection)    Vitamin D deficiency 09/14/2017    Medications:  Scheduled:   atorvastatin  40 mg Oral Daily   feeding supplement  237 mL Oral BID BM   fesoterodine  4 mg Oral QHS   latanoprost  1 drop Both Eyes QHS   mirabegron ER  50 mg Oral Daily   pantoprazole  40 mg Oral Daily   sodium chloride flush  3 mL Intravenous Q12H    Assessment: 89 YO female presenting with shortness of breath, found to have a PE in her bilateral upper pulmonary arteries--no right heart strain. She is on Eliquis PTA for a history of PE but stopped taking it over a week ago (last dose 9/28) due to hematuria. Patient has  reportedly not had hematuria since 10/1. Pharmacy consulted for heparin dosing.   Today, 03/12/23 Heparin level 0.53- therapeutic on IV heparin 750 units/hr Hgb 13.8, plts 175--stable Scr 0.98-  improving No s/sx of bleeding or infusion related concerns reported by RN  Goal of Therapy:  Heparin level 0.3-0.7 units/ml Monitor platelets by anticoagulation protocol: Yes   Plan:  Continue heparin gtt @ 750 units/hr  Check confirmatory heparin level at 1300 Monitor heparin level, CBC, and s/sx of bleeding daily  Junita Push, PharmD, BCPS 10/13/20246:47 AM

## 2023-03-13 ENCOUNTER — Inpatient Hospital Stay (HOSPITAL_COMMUNITY): Payer: Medicare Other

## 2023-03-13 DIAGNOSIS — N3000 Acute cystitis without hematuria: Secondary | ICD-10-CM | POA: Diagnosis not present

## 2023-03-13 DIAGNOSIS — I2699 Other pulmonary embolism without acute cor pulmonale: Secondary | ICD-10-CM | POA: Diagnosis not present

## 2023-03-13 DIAGNOSIS — R0602 Shortness of breath: Secondary | ICD-10-CM

## 2023-03-13 DIAGNOSIS — N1832 Chronic kidney disease, stage 3b: Secondary | ICD-10-CM | POA: Diagnosis not present

## 2023-03-13 DIAGNOSIS — J9601 Acute respiratory failure with hypoxia: Secondary | ICD-10-CM | POA: Diagnosis not present

## 2023-03-13 LAB — BASIC METABOLIC PANEL
Anion gap: 6 (ref 5–15)
BUN: 15 mg/dL (ref 8–23)
CO2: 25 mmol/L (ref 22–32)
Calcium: 8.6 mg/dL — ABNORMAL LOW (ref 8.9–10.3)
Chloride: 109 mmol/L (ref 98–111)
Creatinine, Ser: 1.03 mg/dL — ABNORMAL HIGH (ref 0.44–1.00)
GFR, Estimated: 51 mL/min — ABNORMAL LOW (ref >60)
Glucose, Bld: 95 mg/dL (ref 70–99)
Potassium: 3.9 mmol/L (ref 3.5–5.1)
Sodium: 140 mmol/L (ref 135–145)

## 2023-03-13 LAB — URINE CULTURE: Culture: >=100000 — AB

## 2023-03-13 LAB — CBC
HCT: 40.4 % (ref 36.0–46.0)
Hemoglobin: 13.4 g/dL (ref 12.0–15.0)
MCH: 29.1 pg (ref 26.0–34.0)
MCHC: 33.2 g/dL (ref 30.0–36.0)
MCV: 87.6 fL (ref 80.0–100.0)
Platelets: 176 10*3/uL (ref 150–400)
RBC: 4.61 MIL/uL (ref 3.87–5.11)
RDW: 14.3 % (ref 11.5–15.5)
WBC: 5.7 10*3/uL (ref 4.0–10.5)
nRBC: 0 % (ref 0.0–0.2)

## 2023-03-13 LAB — ECHOCARDIOGRAM COMPLETE
AR max vel: 2.86 cm2
AV Area VTI: 2.73 cm2
AV Area mean vel: 2.57 cm2
AV Mean grad: 3 mm[Hg]
AV Peak grad: 5.3 mm[Hg]
Ao pk vel: 1.15 m/s
Area-P 1/2: 4.46 cm2
Height: 65 in
P 1/2 time: 715 ms
S' Lateral: 2.3 cm
Weight: 1615.53 [oz_av]

## 2023-03-13 LAB — LACTIC ACID, PLASMA: Lactic Acid, Venous: 0.8 mmol/L (ref 0.5–1.9)

## 2023-03-13 LAB — HEPARIN LEVEL (UNFRACTIONATED): Heparin Unfractionated: 0.59 [IU]/mL (ref 0.30–0.70)

## 2023-03-13 NOTE — Evaluation (Signed)
Physical Therapy Evaluation Patient Details Name: Becky Leach MRN: [id number] DOB: [birth year] Today's Date: 03/13/2023  History of Present Illness  89 year old female history of spinal stenosis, CKD 3B, unprovoked PE in 2018 was on chronic anticoagulation, recent hematuria for which patient has been off Eliquis for approximately 2 weeks presenting to the ED with shortness of breath.  Patient noted to have stopped Eliquis on 02/25/2023 and has not had any gross hematuria since 02/28/2023 however became more short of breath.  Patient with nonproductive cough.  Patient denies any change in chronic leg swelling.  Patient also endorsed dysuria.  Urinalysis concerning for UTI.  Patient seen in the ED EKG with a sinus tachycardia with PACs, chest x-ray negative for any acute findings, CT angiogram chest done positive for bilateral upper PE, dopplers showed RLE DVT.  Clinical Impression  Pt admitted with above diagnosis. Pt ambulated 90' with RW, no loss of balance, HR 115 walking, SpO2 89% on room air with walking. Pt reports her son lives with her, but he is currently hospitalized, and that the home health aide who assisted her with bathing stopped coming last week. Pt may need higher level of care upon acute DC if family is not able to provide needed level of care.  Pt currently with functional limitations due to the deficits listed below (see PT Problem List). Pt will benefit from acute skilled PT to increase their independence and safety with mobility to allow discharge.           If plan is discharge home, recommend the following: A little help with bathing/dressing/bathroom;Assistance with cooking/housework;Assist for transportation;Help with stairs or ramp for entrance   Can travel by private vehicle        Equipment Recommendations None recommended by PT  Recommendations for Other Services       Functional Status Assessment Patient has had a recent decline in their functional status and  demonstrates the ability to make significant improvements in function in a reasonable and predictable amount of time.     Precautions / Restrictions Precautions Precautions: Fall Precaution Comments: pt denies falls in the past 6 months Restrictions Weight Bearing Restrictions: No      Mobility  Bed Mobility Overal bed mobility: Modified Independent             General bed mobility comments: HOB up, used rail    Transfers Overall transfer level: Needs assistance Equipment used: Rolling walker (2 wheels) Transfers: Sit to/from Stand Sit to Stand: Contact guard assist, From elevated surface           General transfer comment: good hand placement, CGA for safety    Ambulation/Gait Ambulation/Gait assistance: Supervision Gait Distance (Feet): 90 Feet Assistive device: Rolling walker (2 wheels) Gait Pattern/deviations: Step-through pattern, Decreased stride length, Trunk flexed Gait velocity: decr     General Gait Details: VCs for positioning in RW, no loss of balance; HR 115 with walking, SpO2 89% on room air with ambulation, no dyspnea noted  Stairs            Wheelchair Mobility     Tilt Bed    Modified Rankin (Stroke Patients Only)       Balance Overall balance assessment: Mild deficits observed, not formally tested                                           Pertinent  Vitals/Pain Pain Assessment Pain Assessment: No/denies pain    Home Living Family/patient expects to be discharged to:: Private residence Living Arrangements: Children   Type of Home: House Home Access: Level entry       Home Layout: One level Home Equipment: Agricultural consultant (2 wheels);Shower seat Additional Comments: lives with son, but he is in the hospital ("he couldn't walk")    Prior Function Prior Level of Function : Needs assist             Mobility Comments: walks with RW ADLs Comments: had an aide to help with bathing, but aide stopped  coming last week     Extremity/Trunk Assessment   Upper Extremity Assessment Upper Extremity Assessment: Defer to OT evaluation    Lower Extremity Assessment Lower Extremity Assessment: Overall WFL for tasks assessed    Cervical / Trunk Assessment Cervical / Trunk Assessment: Kyphotic (kyphotic t-spine, forward head)  Communication   Communication Communication: No apparent difficulties  Cognition Arousal: Alert Behavior During Therapy: WFL for tasks assessed/performed Overall Cognitive Status: Within Functional Limits for tasks assessed                                          General Comments      Exercises     Assessment/Plan    PT Assessment Patient needs continued PT services  PT Problem List Decreased activity tolerance;Cardiopulmonary status limiting activity       PT Treatment Interventions Gait training;Therapeutic exercise;Therapeutic activities;Functional mobility training    PT Goals (Current goals can be found in the Care Plan section)  Acute Rehab PT Goals Patient Stated Goal: to get stronger PT Goal Formulation: With patient Time For Goal Achievement: 03/27/23 Potential to Achieve Goals: Good    Frequency Min 1X/week     Co-evaluation               AM-PAC PT "6 Clicks" Mobility  Outcome Measure Help needed turning from your back to your side while in a flat bed without using bedrails?: A Little Help needed moving from lying on your back to sitting on the side of a flat bed without using bedrails?: A Little Help needed moving to and from a bed to a chair (including a wheelchair)?: A Little Help needed standing up from a chair using your arms (e.g., wheelchair or bedside chair)?: A Little Help needed to walk in hospital room?: A Little Help needed climbing 3-5 steps with a railing? : A Little 6 Click Score: 18    End of Session Equipment Utilized During Treatment: Gait belt Activity Tolerance: Patient tolerated  treatment well Patient left: in chair;with chair alarm set;with call bell/phone within reach Nurse Communication: Mobility status PT Visit Diagnosis: Difficulty in walking, not elsewhere classified (R26.2)    Time: 4782-9562 PT Time Calculation (min) (ACUTE ONLY): 27 min   Charges:   PT Evaluation $PT Eval Moderate Complexity: 1 Mod PT Treatments $Gait Training: 8-22 mins PT General Charges $$ ACUTE PT VISIT: 1 Visit        Tamala Ser PT 03/13/2023  Acute Rehabilitation Services  Office [phone]

## 2023-03-13 NOTE — Evaluation (Signed)
Clinical/Bedside Swallow Evaluation Patient Details  Name: Becky Leach MRN: [id number] Date of Birth: 10/01/1929  Today's Date: 03/13/2023 Time: SLP Start Time (ACUTE ONLY): 0900 SLP Stop Time (ACUTE ONLY): 0930 SLP Time Calculation (min) (ACUTE ONLY): 30 min  Past Medical History:  Past Medical History:  Diagnosis Date   AKI (acute kidney injury) (HCC)    due to NSAID   Arthritis    Closed fracture of left proximal humerus 09/14/2017   Constipation    DVT (deep venous thrombosis) (HCC)    Herniated disc    Hypertension    Pulmonary embolism (HCC)    Seasonal allergies    Spinal stenosis    Stomach ulcer    due to NSAID   Tachycardia    UTI (lower urinary tract infection)    Vitamin D deficiency 09/14/2017   Past Surgical History:  Past Surgical History:  Procedure Laterality Date   COLONOSCOPY  12/21/2011   Procedure: COLONOSCOPY;  Surgeon: [name], MD;  Location: [facility];  Service: Endoscopy;  Laterality: N/A;   DILATION AND CURETTAGE OF UTERUS     ORIF HUMERUS FRACTURE Left 09/12/2017   Procedure: OPEN REDUCTION INTERNAL FIXATION (ORIF) PROXIMAL  HUMERUS FRACTURE;  Surgeon: Myrene Galas, MD;  Location: [facility] OR;  Service: Orthopedics;  Laterality: Left;   TONSILLECTOMY AND ADENOIDECTOMY     HPI:  Patient is a 89 y.o. female who presented on 03/12/23 with shortness of breath. Patient noted to have stopped Eliquis on 02/25/23 after recent hematuria. Urinalysis concerning for UTI.  Patient seen in the ED EKG with a sinus tachycardia with PACs, chest x-ray negative for any acute findings, CT angiogram chest done positive for bilateral upper PE. PMH significant for spinal stenosis, CKD 3b, unprovoked PE. MBS completed in 2022 with recommendation of Dys 3, nectar thick liquids.    Assessment / Plan / Recommendation  Clinical Impression  Patient seen by SLP for bedside swallow evaluation per hx of dysphagia. The patient was awake, alert, and cooperative. No family was  present at bedside at this time. Her breakfast tray had just arrived when SLP entered the room. Patient required set up assistance with solid foods, as she requested they be cut into bite size pieces. One instance of patient requesting receptacle to spit secretions out into. Secretions were clear and unremarkable. SLP directly observed the patient with thin liquids via straw, puree, and regular solid food. Subtle throat clearing and an occasional delayed cough observed with thin liquids and solid foods. Multiple swallows were evident with thins. No overt s/sx of aspiration present with puree. MBS in 2022 revealed inconsistent, trace amounts of aspiration with thin liquids. When SLP left the room, patient stated to PT that she felt as though food was stuck at the top of her throat. SLP recommending completion of modified barium swallow study to further investigate swallow function. Dys 3 (mechanical soft) and thin liquid diet is appropriate at this time. ST to continue to follow up pending results of MBS. SLP Visit Diagnosis: Dysphagia, unspecified (R13.10)    Aspiration Risk  Mild aspiration risk    Diet Recommendation Dysphagia 3 (Mech soft);Thin liquid    Liquid Administration via: Straw;Cup Medication Administration: Whole meds with puree Supervision: Patient able to self feed;Staff to assist with self feeding Compensations: Minimize environmental distractions;Slow rate;Small sips/bites Postural Changes: Seated upright at 90 degrees    Other  Recommendations Oral Care Recommendations: Oral care BID    Recommendations for follow up therapy are one component  of a multi-disciplinary discharge planning process, led by the attending physician.  Recommendations may be updated based on patient status, additional functional criteria and insurance authorization.  Follow up Recommendations  (TBD)      Assistance Recommended at Discharge    Functional Status Assessment Patient has had a recent decline  in their functional status and demonstrates the ability to make significant improvements in function in a reasonable and predictable amount of time.  Frequency and Duration            Prognosis Prognosis for improved oropharyngeal function: Good      Swallow Study   General Date of Onset: 03/12/23 HPI: Patient is a 89 y.o. female who presented on 03/12/23 with shortness of breath. Patient noted to have stopped Eliquis on 02/25/23 after recent hematuria. Urinalysis concerning for UTI.  Patient seen in the ED EKG with a sinus tachycardia with PACs, chest x-ray negative for any acute findings, CT angiogram chest done positive for bilateral upper PE. PMH significant for spinal stenosis, CKD 3b, unprovoked PE. MBS completed in 2022 with reccommendation onf Dys 3, nectar thick liquids. Type of Study: Bedside Swallow Evaluation Diet Prior to this Study: Regular;Thin liquids (Level 0) Temperature Spikes Noted: No Respiratory Status: Room air History of Recent Intubation: No Behavior/Cognition: Alert;Cooperative;Pleasant mood Oral Cavity Assessment: Within Functional Limits Oral Care Completed by SLP: No Oral Cavity - Dentition: Edentulous Vision: Functional for self-feeding Self-Feeding Abilities: Able to feed self;Needs set up Patient Positioning: Upright in bed Baseline Vocal Quality: Normal Volitional Swallow: Able to elicit    Oral/Motor/Sensory Function Overall Oral Motor/Sensory Function: Within functional limits   Ice Chips Ice chips: Not tested   Thin Liquid Thin Liquid: Impaired Presentation: Straw Pharyngeal  Phase Impairments: Throat Clearing - Delayed;Cough - Delayed;Multiple swallows    Nectar Thick Nectar Thick Liquid: Not tested   Honey Thick Honey Thick Liquid: Not tested   Puree Puree: Within functional limits Presentation: Self Fed;Spoon   Solid     Solid: Impaired Presentation: Self Fed Oral Phase Impairments: Impaired mastication Pharyngeal Phase Impairments:  Cough - Delayed;Throat Clearing - Delayed      Marline Backbone, B.S., Speech Therapy Student   03/13/2023,10:53 AM

## 2023-03-13 NOTE — Progress Notes (Signed)
PHARMACY - ANTICOAGULATION CONSULT NOTE  Pharmacy Consult for heparin Indication: pulmonary embolus  Allergies  Allergen Reactions   Nsaids Other (See Comments)    "NSAIDS WILL CAUSE BLEEDING ULCERS"- do NOT give   Penicillins Rash and Other (See Comments)    Cephalosporin tolerant given during surgery 08/2017    Patient Measurements: Height: 5\' 5"  (165.1 cm) Weight: 45.8 kg (100 lb 15.5 oz) IBW/kg (Calculated) : 57 Heparin Dosing Weight: TBW  Vital Signs: Temp: 97.6 F (36.4 C) (10/14 0445) Temp Source: Oral (10/14 0445) BP: 152/99 (10/14 0445) Pulse Rate: 94 (10/14 0445)  Labs: Recent Labs    03/11/23 1420 03/12/23 0458 03/12/23 1235 03/13/23 0438  HGB 13.6 13.8  --  13.4  HCT 40.9 41.8  --  40.4  PLT 179 175  --  176  APTT 27  --   --   --   LABPROT 15.5*  --   --   --   INR 1.2  --   --   --   HEPARINUNFRC  --  0.53 0.47 0.59  CREATININE 1.26* 0.98  --  1.03*    Estimated Creatinine Clearance: 25.7 mL/min (A) (by C-G formula based on SCr of 1.03 mg/dL (H)).   Medical History: Past Medical History:  Diagnosis Date   AKI (acute kidney injury) (HCC)    due to NSAID   Arthritis    Closed fracture of left proximal humerus 09/14/2017   Constipation    DVT (deep venous thrombosis) (HCC)    Herniated disc    Hypertension    Pulmonary embolism (HCC)    Seasonal allergies    Spinal stenosis    Stomach ulcer    due to NSAID   Tachycardia    UTI (lower urinary tract infection)    Vitamin D deficiency 09/14/2017    Medications:  Scheduled:   amLODipine  2.5 mg Oral Daily   atorvastatin  40 mg Oral Daily   cholecalciferol  1,000 Units Oral Daily   feeding supplement  237 mL Oral BID BM   fesoterodine  4 mg Oral QHS   latanoprost  1 drop Both Eyes QHS   loratadine  10 mg Oral Daily   mirabegron ER  50 mg Oral Daily   pantoprazole  40 mg Oral Daily   sodium chloride flush  3 mL Intravenous Q12H    Assessment: 89 YO female presenting with shortness of  breath, found to have a PE in her bilateral upper pulmonary arteries--no right heart strain. She is on Eliquis PTA for a history of PE but stopped taking it over a week ago (last dose 9/28) due to hematuria. Patient has reportedly not had hematuria since 10/1. Pharmacy consulted for heparin dosing.   Today, 03/13/23 Heparin level continues to be therapeutic on IV heparin 750 units/hr CBC stable Scr 0.98--improving No s/sx of bleeding or infusion related concerns reported  Goal of Therapy:  Heparin level 0.3-0.7 units/ml Monitor platelets by anticoagulation protocol: Yes   Plan:  Continue heparin gtt @ 750 units/hr  Monitor heparin level, CBC, and s/sx of bleeding daily F/u transition to PO anticoagulation   Hessie Knows, PharmD, BCPS Secure Chat if ?s 03/13/2023 8:50 AM

## 2023-03-13 NOTE — Progress Notes (Signed)
PROGRESS NOTE    Becky Leach  ZOX:[id number] DOB: 04/09/1930 DOA: 03/11/2023 PCP: Erick Alley, DO    No chief complaint on file.   Brief Narrative:  Patient pleasant 89 year old female history of spinal stenosis, CKD 3B, unprovoked PE in 2018 was on chronic anticoagulation, recent hematuria for which patient has been off Eliquis for approximately 2 weeks presenting to the ED with shortness of breath.  Patient noted to have stopped Eliquis on 02/25/2023 and has not had any gross hematuria since 02/28/2023 however became more short of breath.  Patient with nonproductive cough.  Patient denies any change in chronic leg swelling.  Patient also endorsed dysuria.  Urinalysis concerning for UTI.  Patient seen in the ED EKG with a sinus tachycardia with PACs, chest x-ray negative for any acute findings, CT angiogram chest done positive for bilateral upper PE.  Patient admitted, placed on IV heparin, 2D echo and lower extremity Dopplers ordered.   Assessment & Plan:   Principal Problem:   Acute respiratory failure with hypoxia (HCC) Active Problems:   History of pulmonary embolus (PE)   CKD (chronic kidney disease) stage 3, GFR 30-59 ml/min (HCC)   Hematuria   Lactic acidosis   Acute cystitis   Acute pulmonary embolism (HCC)   Lung nodule   Acute deep vein thrombosis (DVT) of proximal vein of right lower extremity (HCC)  #1 acute respiratory failure with hypoxia secondary to acute bilateral PE/extensive right lower extremity DVT -Patient presenting with acute shortness of breath after discontinuation of Eliquis 02/25/2023 due to hematuria. -Patient noted with no further hematuria 02/28/2023. -Chest x-ray done negative for any acute abnormalities. -CT angiogram chest done with bilateral upper pulmonary artery PE without right heart strain. -Patient with clinical improvement after being started on IV heparin. -Lower extremity Dopplers ordered with RLE DVT in distal EIV, CFV, SFJ, FV, PFV,  popliteal vein. -2D echo with EF of 60 to 65%, grade 1 diastolic dysfunction,NWMA, normal right ventricle with low normal right ventricular systolic function, no increasing right ventricular wall thickness. -Continue IV heparin. -Could likely transition back to oral coag/Eliquis tomorrow. -Outpatient follow-up with primary pulmonologist, Dr. Isaiah Serge.  2.  Proteus Mirabilis UTI -Patient presented with urinary symptoms of dysuria which has improved per patient.. -Urinalysis concerning for UTI. -Urine cultures consistent with >100,000 colonies of Proteus Mirabilis, -Continue IV Rocephin and can likely transition to oral antibiotics tomorrow to complete a 5-day course of treatment..  3.  CKD stage IIIb -Stable.  4.  Gross hematuria -No further gross hematuria after stopping Eliquis. -On IV heparin will follow for now.  -Will need to reschedule outpatient follow-up appointment with urology.  5.  Lung nodule -Incidentally noted on CT scan. -Outpatient follow-up with pulmonary..   DVT prophylaxis: Heparin Code Status: Full Family Communication: Updated patient, son at bedside. Disposition: Likely home when clinically improved.  Status is: Inpatient Remains inpatient appropriate because: Severity of illness   Consultants:  None  Procedures: CT angiogram chest 03/11/2023 Bilateral lower extremity Dopplers  Antimicrobials:  Anti-infectives (From admission, onward)    Start     Dose/Rate Route Frequency Ordered Stop   03/11/23 1530  cefTRIAXone (ROCEPHIN) 2 g in sodium chloride 0.9 % 100 mL IVPB        2 g 200 mL/hr over 30 Minutes Intravenous Every 24 hours 03/11/23 1529            Subjective: Patient laying in bed just returned from radiology.  Denies any chest pain.  States shortness of  PROGRESS NOTE    Becky Leach  ZOX:[id number] DOB: 04/09/1930 DOA: 03/11/2023 PCP: Erick Alley, DO    No chief complaint on file.   Brief Narrative:  Patient pleasant 89 year old female history of spinal stenosis, CKD 3B, unprovoked PE in 2018 was on chronic anticoagulation, recent hematuria for which patient has been off Eliquis for approximately 2 weeks presenting to the ED with shortness of breath.  Patient noted to have stopped Eliquis on 02/25/2023 and has not had any gross hematuria since 02/28/2023 however became more short of breath.  Patient with nonproductive cough.  Patient denies any change in chronic leg swelling.  Patient also endorsed dysuria.  Urinalysis concerning for UTI.  Patient seen in the ED EKG with a sinus tachycardia with PACs, chest x-ray negative for any acute findings, CT angiogram chest done positive for bilateral upper PE.  Patient admitted, placed on IV heparin, 2D echo and lower extremity Dopplers ordered.   Assessment & Plan:   Principal Problem:   Acute respiratory failure with hypoxia (HCC) Active Problems:   History of pulmonary embolus (PE)   CKD (chronic kidney disease) stage 3, GFR 30-59 ml/min (HCC)   Hematuria   Lactic acidosis   Acute cystitis   Acute pulmonary embolism (HCC)   Lung nodule   Acute deep vein thrombosis (DVT) of proximal vein of right lower extremity (HCC)  #1 acute respiratory failure with hypoxia secondary to acute bilateral PE/extensive right lower extremity DVT -Patient presenting with acute shortness of breath after discontinuation of Eliquis 02/25/2023 due to hematuria. -Patient noted with no further hematuria 02/28/2023. -Chest x-ray done negative for any acute abnormalities. -CT angiogram chest done with bilateral upper pulmonary artery PE without right heart strain. -Patient with clinical improvement after being started on IV heparin. -Lower extremity Dopplers ordered with RLE DVT in distal EIV, CFV, SFJ, FV, PFV,  popliteal vein. -2D echo with EF of 60 to 65%, grade 1 diastolic dysfunction,NWMA, normal right ventricle with low normal right ventricular systolic function, no increasing right ventricular wall thickness. -Continue IV heparin. -Could likely transition back to oral coag/Eliquis tomorrow. -Outpatient follow-up with primary pulmonologist, Dr. Isaiah Serge.  2.  Proteus Mirabilis UTI -Patient presented with urinary symptoms of dysuria which has improved per patient.. -Urinalysis concerning for UTI. -Urine cultures consistent with >100,000 colonies of Proteus Mirabilis, -Continue IV Rocephin and can likely transition to oral antibiotics tomorrow to complete a 5-day course of treatment..  3.  CKD stage IIIb -Stable.  4.  Gross hematuria -No further gross hematuria after stopping Eliquis. -On IV heparin will follow for now.  -Will need to reschedule outpatient follow-up appointment with urology.  5.  Lung nodule -Incidentally noted on CT scan. -Outpatient follow-up with pulmonary..   DVT prophylaxis: Heparin Code Status: Full Family Communication: Updated patient, son at bedside. Disposition: Likely home when clinically improved.  Status is: Inpatient Remains inpatient appropriate because: Severity of illness   Consultants:  None  Procedures: CT angiogram chest 03/11/2023 Bilateral lower extremity Dopplers  Antimicrobials:  Anti-infectives (From admission, onward)    Start     Dose/Rate Route Frequency Ordered Stop   03/11/23 1530  cefTRIAXone (ROCEPHIN) 2 g in sodium chloride 0.9 % 100 mL IVPB        2 g 200 mL/hr over 30 Minutes Intravenous Every 24 hours 03/11/23 1529            Subjective: Patient laying in bed just returned from radiology.  Denies any chest pain.  States shortness of  PROGRESS NOTE    Becky Leach  ZOX:[id number] DOB: 04/09/1930 DOA: 03/11/2023 PCP: Erick Alley, DO    No chief complaint on file.   Brief Narrative:  Patient pleasant 89 year old female history of spinal stenosis, CKD 3B, unprovoked PE in 2018 was on chronic anticoagulation, recent hematuria for which patient has been off Eliquis for approximately 2 weeks presenting to the ED with shortness of breath.  Patient noted to have stopped Eliquis on 02/25/2023 and has not had any gross hematuria since 02/28/2023 however became more short of breath.  Patient with nonproductive cough.  Patient denies any change in chronic leg swelling.  Patient also endorsed dysuria.  Urinalysis concerning for UTI.  Patient seen in the ED EKG with a sinus tachycardia with PACs, chest x-ray negative for any acute findings, CT angiogram chest done positive for bilateral upper PE.  Patient admitted, placed on IV heparin, 2D echo and lower extremity Dopplers ordered.   Assessment & Plan:   Principal Problem:   Acute respiratory failure with hypoxia (HCC) Active Problems:   History of pulmonary embolus (PE)   CKD (chronic kidney disease) stage 3, GFR 30-59 ml/min (HCC)   Hematuria   Lactic acidosis   Acute cystitis   Acute pulmonary embolism (HCC)   Lung nodule   Acute deep vein thrombosis (DVT) of proximal vein of right lower extremity (HCC)  #1 acute respiratory failure with hypoxia secondary to acute bilateral PE/extensive right lower extremity DVT -Patient presenting with acute shortness of breath after discontinuation of Eliquis 02/25/2023 due to hematuria. -Patient noted with no further hematuria 02/28/2023. -Chest x-ray done negative for any acute abnormalities. -CT angiogram chest done with bilateral upper pulmonary artery PE without right heart strain. -Patient with clinical improvement after being started on IV heparin. -Lower extremity Dopplers ordered with RLE DVT in distal EIV, CFV, SFJ, FV, PFV,  popliteal vein. -2D echo with EF of 60 to 65%, grade 1 diastolic dysfunction,NWMA, normal right ventricle with low normal right ventricular systolic function, no increasing right ventricular wall thickness. -Continue IV heparin. -Could likely transition back to oral coag/Eliquis tomorrow. -Outpatient follow-up with primary pulmonologist, Dr. Isaiah Serge.  2.  Proteus Mirabilis UTI -Patient presented with urinary symptoms of dysuria which has improved per patient.. -Urinalysis concerning for UTI. -Urine cultures consistent with >100,000 colonies of Proteus Mirabilis, -Continue IV Rocephin and can likely transition to oral antibiotics tomorrow to complete a 5-day course of treatment..  3.  CKD stage IIIb -Stable.  4.  Gross hematuria -No further gross hematuria after stopping Eliquis. -On IV heparin will follow for now.  -Will need to reschedule outpatient follow-up appointment with urology.  5.  Lung nodule -Incidentally noted on CT scan. -Outpatient follow-up with pulmonary..   DVT prophylaxis: Heparin Code Status: Full Family Communication: Updated patient, son at bedside. Disposition: Likely home when clinically improved.  Status is: Inpatient Remains inpatient appropriate because: Severity of illness   Consultants:  None  Procedures: CT angiogram chest 03/11/2023 Bilateral lower extremity Dopplers  Antimicrobials:  Anti-infectives (From admission, onward)    Start     Dose/Rate Route Frequency Ordered Stop   03/11/23 1530  cefTRIAXone (ROCEPHIN) 2 g in sodium chloride 0.9 % 100 mL IVPB        2 g 200 mL/hr over 30 Minutes Intravenous Every 24 hours 03/11/23 1529            Subjective: Patient laying in bed just returned from radiology.  Denies any chest pain.  States shortness of  of the Act, 21 U.S.C. section 360bbb-3(b)(1), unless the authorization is terminated or revoked sooner.  Performed at [facility], [address], [place], [place] [address]   Blood Culture (routine x 2)     Status: None (Preliminary result)   Collection Time: 03/11/23  2:20 PM   Specimen: BLOOD  Result Value Ref Range Status   Specimen Description   Final    BLOOD LEFT ANTECUBITAL Performed at [facility], [address], [place], [place] [address]    Special Requests   Final    BOTTLES DRAWN AEROBIC AND ANAEROBIC Blood Culture adequate volume Performed at [facility], [address], [place], [place] [address]    Culture   Final    NO GROWTH 2 DAYS Performed at [facility], [address] [place], [place] [address]    Report Status PENDING  Incomplete  Urine Culture     Status: Abnormal   Collection Time: 03/11/23  2:47 PM   Specimen: Urine, Random  Result Value Ref Range Status   Specimen Description   Final    URINE, RANDOM Performed at [facility], [address] [place], [place] [address]    Special Requests   Final    NONE Reflexed from [id number] Performed at [facility], [address] [place], [place] [address]    Culture >=100,000 COLONIES/mL PROTEUS MIRABILIS (A)  Final   Report Status 03/13/2023 FINAL  Final   Organism ID, Bacteria PROTEUS MIRABILIS (A)  Final      Susceptibility   Proteus mirabilis - MIC*    AMPICILLIN <=2 SENSITIVE Sensitive     CEFAZOLIN <=4 SENSITIVE Sensitive     CEFEPIME <=0.12 SENSITIVE Sensitive     CEFTRIAXONE <=0.25 SENSITIVE Sensitive     CIPROFLOXACIN <=0.25 SENSITIVE Sensitive     GENTAMICIN <=1 SENSITIVE Sensitive     IMIPENEM 4 SENSITIVE Sensitive     NITROFURANTOIN RESISTANT Resistant     TRIMETH/SULFA <=20 SENSITIVE Sensitive     AMPICILLIN/SULBACTAM <=2 SENSITIVE Sensitive     PIP/TAZO <=4 SENSITIVE Sensitive ug/mL    * >=100,000 COLONIES/mL PROTEUS MIRABILIS  Blood Culture (routine x 2)     Status: None (Preliminary result)   Collection Time: 03/11/23  3:52 PM   Specimen: BLOOD  Result Value Ref Range Status   Specimen Description   Final    BLOOD RIGHT ANTECUBITAL Performed at [facility], [address], [place], [place] [address]    Special Requests   Final    BOTTLES DRAWN AEROBIC AND ANAEROBIC Blood Culture adequate volume Performed at [facility], [address] [place], [place] [address]    Culture   Final    NO GROWTH 2 DAYS Performed at [facility], [address] [place], [place]  [address]    Report Status PENDING  Incomplete         Radiology Studies: ECHOCARDIOGRAM COMPLETE  Result Date: 03/13/2023    ECHOCARDIOGRAM REPORT   Patient Name:   Becky Leach Date of Exam: 03/13/2023 Medical Rec #:  [id number]     Height:       65.0 in Accession #:    [id number]    Weight:       101.0 lb Date of Birth:  [birth year]      BSA:          1.479 m Patient Age:    33 years  PROGRESS NOTE    Becky Leach  ZOX:[id number] DOB: 04/09/1930 DOA: 03/11/2023 PCP: Erick Alley, DO    No chief complaint on file.   Brief Narrative:  Patient pleasant 89 year old female history of spinal stenosis, CKD 3B, unprovoked PE in 2018 was on chronic anticoagulation, recent hematuria for which patient has been off Eliquis for approximately 2 weeks presenting to the ED with shortness of breath.  Patient noted to have stopped Eliquis on 02/25/2023 and has not had any gross hematuria since 02/28/2023 however became more short of breath.  Patient with nonproductive cough.  Patient denies any change in chronic leg swelling.  Patient also endorsed dysuria.  Urinalysis concerning for UTI.  Patient seen in the ED EKG with a sinus tachycardia with PACs, chest x-ray negative for any acute findings, CT angiogram chest done positive for bilateral upper PE.  Patient admitted, placed on IV heparin, 2D echo and lower extremity Dopplers ordered.   Assessment & Plan:   Principal Problem:   Acute respiratory failure with hypoxia (HCC) Active Problems:   History of pulmonary embolus (PE)   CKD (chronic kidney disease) stage 3, GFR 30-59 ml/min (HCC)   Hematuria   Lactic acidosis   Acute cystitis   Acute pulmonary embolism (HCC)   Lung nodule   Acute deep vein thrombosis (DVT) of proximal vein of right lower extremity (HCC)  #1 acute respiratory failure with hypoxia secondary to acute bilateral PE/extensive right lower extremity DVT -Patient presenting with acute shortness of breath after discontinuation of Eliquis 02/25/2023 due to hematuria. -Patient noted with no further hematuria 02/28/2023. -Chest x-ray done negative for any acute abnormalities. -CT angiogram chest done with bilateral upper pulmonary artery PE without right heart strain. -Patient with clinical improvement after being started on IV heparin. -Lower extremity Dopplers ordered with RLE DVT in distal EIV, CFV, SFJ, FV, PFV,  popliteal vein. -2D echo with EF of 60 to 65%, grade 1 diastolic dysfunction,NWMA, normal right ventricle with low normal right ventricular systolic function, no increasing right ventricular wall thickness. -Continue IV heparin. -Could likely transition back to oral coag/Eliquis tomorrow. -Outpatient follow-up with primary pulmonologist, Dr. Isaiah Serge.  2.  Proteus Mirabilis UTI -Patient presented with urinary symptoms of dysuria which has improved per patient.. -Urinalysis concerning for UTI. -Urine cultures consistent with >100,000 colonies of Proteus Mirabilis, -Continue IV Rocephin and can likely transition to oral antibiotics tomorrow to complete a 5-day course of treatment..  3.  CKD stage IIIb -Stable.  4.  Gross hematuria -No further gross hematuria after stopping Eliquis. -On IV heparin will follow for now.  -Will need to reschedule outpatient follow-up appointment with urology.  5.  Lung nodule -Incidentally noted on CT scan. -Outpatient follow-up with pulmonary..   DVT prophylaxis: Heparin Code Status: Full Family Communication: Updated patient, son at bedside. Disposition: Likely home when clinically improved.  Status is: Inpatient Remains inpatient appropriate because: Severity of illness   Consultants:  None  Procedures: CT angiogram chest 03/11/2023 Bilateral lower extremity Dopplers  Antimicrobials:  Anti-infectives (From admission, onward)    Start     Dose/Rate Route Frequency Ordered Stop   03/11/23 1530  cefTRIAXone (ROCEPHIN) 2 g in sodium chloride 0.9 % 100 mL IVPB        2 g 200 mL/hr over 30 Minutes Intravenous Every 24 hours 03/11/23 1529            Subjective: Patient laying in bed just returned from radiology.  Denies any chest pain.  States shortness of  PROGRESS NOTE    Becky Leach  ZOX:[id number] DOB: 04/09/1930 DOA: 03/11/2023 PCP: Erick Alley, DO    No chief complaint on file.   Brief Narrative:  Patient pleasant 89 year old female history of spinal stenosis, CKD 3B, unprovoked PE in 2018 was on chronic anticoagulation, recent hematuria for which patient has been off Eliquis for approximately 2 weeks presenting to the ED with shortness of breath.  Patient noted to have stopped Eliquis on 02/25/2023 and has not had any gross hematuria since 02/28/2023 however became more short of breath.  Patient with nonproductive cough.  Patient denies any change in chronic leg swelling.  Patient also endorsed dysuria.  Urinalysis concerning for UTI.  Patient seen in the ED EKG with a sinus tachycardia with PACs, chest x-ray negative for any acute findings, CT angiogram chest done positive for bilateral upper PE.  Patient admitted, placed on IV heparin, 2D echo and lower extremity Dopplers ordered.   Assessment & Plan:   Principal Problem:   Acute respiratory failure with hypoxia (HCC) Active Problems:   History of pulmonary embolus (PE)   CKD (chronic kidney disease) stage 3, GFR 30-59 ml/min (HCC)   Hematuria   Lactic acidosis   Acute cystitis   Acute pulmonary embolism (HCC)   Lung nodule   Acute deep vein thrombosis (DVT) of proximal vein of right lower extremity (HCC)  #1 acute respiratory failure with hypoxia secondary to acute bilateral PE/extensive right lower extremity DVT -Patient presenting with acute shortness of breath after discontinuation of Eliquis 02/25/2023 due to hematuria. -Patient noted with no further hematuria 02/28/2023. -Chest x-ray done negative for any acute abnormalities. -CT angiogram chest done with bilateral upper pulmonary artery PE without right heart strain. -Patient with clinical improvement after being started on IV heparin. -Lower extremity Dopplers ordered with RLE DVT in distal EIV, CFV, SFJ, FV, PFV,  popliteal vein. -2D echo with EF of 60 to 65%, grade 1 diastolic dysfunction,NWMA, normal right ventricle with low normal right ventricular systolic function, no increasing right ventricular wall thickness. -Continue IV heparin. -Could likely transition back to oral coag/Eliquis tomorrow. -Outpatient follow-up with primary pulmonologist, Dr. Isaiah Serge.  2.  Proteus Mirabilis UTI -Patient presented with urinary symptoms of dysuria which has improved per patient.. -Urinalysis concerning for UTI. -Urine cultures consistent with >100,000 colonies of Proteus Mirabilis, -Continue IV Rocephin and can likely transition to oral antibiotics tomorrow to complete a 5-day course of treatment..  3.  CKD stage IIIb -Stable.  4.  Gross hematuria -No further gross hematuria after stopping Eliquis. -On IV heparin will follow for now.  -Will need to reschedule outpatient follow-up appointment with urology.  5.  Lung nodule -Incidentally noted on CT scan. -Outpatient follow-up with pulmonary..   DVT prophylaxis: Heparin Code Status: Full Family Communication: Updated patient, son at bedside. Disposition: Likely home when clinically improved.  Status is: Inpatient Remains inpatient appropriate because: Severity of illness   Consultants:  None  Procedures: CT angiogram chest 03/11/2023 Bilateral lower extremity Dopplers  Antimicrobials:  Anti-infectives (From admission, onward)    Start     Dose/Rate Route Frequency Ordered Stop   03/11/23 1530  cefTRIAXone (ROCEPHIN) 2 g in sodium chloride 0.9 % 100 mL IVPB        2 g 200 mL/hr over 30 Minutes Intravenous Every 24 hours 03/11/23 1529            Subjective: Patient laying in bed just returned from radiology.  Denies any chest pain.  States shortness of  Index        RIGHT ATRIUM           Index LA Vol (A2C):   48.9 ml 33.05 ml/m  RA Area:     10.50 cm LA Vol (A4C):   34.1 ml 23.05 ml/m  RA Volume:   19.50 ml  13.18 ml/m LA Biplane Vol: 41.9 ml 28.32 ml/m  AORTIC VALVE AV Area (Vmax):    2.86 cm AV Area (Vmean):   2.57 cm AV Area (VTI):     2.73 cm AV Vmax:            115.00 cm/s AV Vmean:          86.900 cm/s AV VTI:            0.215 m AV Peak Grad:      5.3 mmHg AV Mean Grad:      3.0 mmHg LVOT Vmax:         116.00 cm/s LVOT Vmean:        78.800 cm/s LVOT VTI:          0.207 m LVOT/AV VTI ratio: 0.96 AI PHT:            715 msec  AORTA Ao Root diam: 3.30 cm Ao Asc diam:  3.70 cm MITRAL VALVE                TRICUSPID VALVE MV Area (PHT): 4.46 cm     TR Peak grad:   27.7 mmHg MV Decel Time: 170 msec     TR Vmax:        263.00 cm/s MV E velocity: 58.30 cm/s MV A velocity: 108.00 cm/s  SHUNTS MV E/A ratio:  0.54         Systemic VTI:  0.21 m                             Systemic Diam: 1.90 cm Dalton McleanMD Electronically signed by Wilfred Lacy Signature Date/Time: 03/13/2023/9:10:09 AM    Final (Updated)    VAS [place] LOWER EXTREMITY VENOUS (DVT)  Result Date: 03/12/2023  Lower Venous DVT Study Patient Name:  Becky Leach  Date of Exam:   03/12/2023 Medical Rec #: [id number]      Accession #:    [id number] Date of Birth: [birth year]       Patient Gender: F Patient Age:   62 years Exam Location:  [facility] Procedure:      VAS [place] LOWER EXTREMITY VENOUS (DVT) Referring Phys: Ramiro Harvest --------------------------------------------------------------------------------  Indications: Pulmonary embolism.  Risk Factors: PE & DVT 2018. Comparison Study: Previous exam on 05/12/2017 positive for DVT in CFV & FV Performing Technologist: Jody Hill RVT, RDMS  Examination Guidelines: A complete evaluation includes B-mode imaging, spectral Doppler, color Doppler, and power Doppler as needed of all accessible portions of each vessel. Bilateral testing is considered an integral part of a complete examination. Limited examinations for reoccurring indications may be performed as noted. The reflux portion of the exam is performed with the patient in reverse Trendelenburg.  +---------+---------------+---------+-----------+----------+--------------+ RIGHT     CompressibilityPhasicitySpontaneityPropertiesThrombus Aging +---------+---------------+---------+-----------+----------+--------------+ CFV      None           No       No                   Chronic        +---------+---------------+---------+-----------+----------+--------------+ SFJ  PROGRESS NOTE    Becky Leach  ZOX:[id number] DOB: 04/09/1930 DOA: 03/11/2023 PCP: Erick Alley, DO    No chief complaint on file.   Brief Narrative:  Patient pleasant 89 year old female history of spinal stenosis, CKD 3B, unprovoked PE in 2018 was on chronic anticoagulation, recent hematuria for which patient has been off Eliquis for approximately 2 weeks presenting to the ED with shortness of breath.  Patient noted to have stopped Eliquis on 02/25/2023 and has not had any gross hematuria since 02/28/2023 however became more short of breath.  Patient with nonproductive cough.  Patient denies any change in chronic leg swelling.  Patient also endorsed dysuria.  Urinalysis concerning for UTI.  Patient seen in the ED EKG with a sinus tachycardia with PACs, chest x-ray negative for any acute findings, CT angiogram chest done positive for bilateral upper PE.  Patient admitted, placed on IV heparin, 2D echo and lower extremity Dopplers ordered.   Assessment & Plan:   Principal Problem:   Acute respiratory failure with hypoxia (HCC) Active Problems:   History of pulmonary embolus (PE)   CKD (chronic kidney disease) stage 3, GFR 30-59 ml/min (HCC)   Hematuria   Lactic acidosis   Acute cystitis   Acute pulmonary embolism (HCC)   Lung nodule   Acute deep vein thrombosis (DVT) of proximal vein of right lower extremity (HCC)  #1 acute respiratory failure with hypoxia secondary to acute bilateral PE/extensive right lower extremity DVT -Patient presenting with acute shortness of breath after discontinuation of Eliquis 02/25/2023 due to hematuria. -Patient noted with no further hematuria 02/28/2023. -Chest x-ray done negative for any acute abnormalities. -CT angiogram chest done with bilateral upper pulmonary artery PE without right heart strain. -Patient with clinical improvement after being started on IV heparin. -Lower extremity Dopplers ordered with RLE DVT in distal EIV, CFV, SFJ, FV, PFV,  popliteal vein. -2D echo with EF of 60 to 65%, grade 1 diastolic dysfunction,NWMA, normal right ventricle with low normal right ventricular systolic function, no increasing right ventricular wall thickness. -Continue IV heparin. -Could likely transition back to oral coag/Eliquis tomorrow. -Outpatient follow-up with primary pulmonologist, Dr. Isaiah Serge.  2.  Proteus Mirabilis UTI -Patient presented with urinary symptoms of dysuria which has improved per patient.. -Urinalysis concerning for UTI. -Urine cultures consistent with >100,000 colonies of Proteus Mirabilis, -Continue IV Rocephin and can likely transition to oral antibiotics tomorrow to complete a 5-day course of treatment..  3.  CKD stage IIIb -Stable.  4.  Gross hematuria -No further gross hematuria after stopping Eliquis. -On IV heparin will follow for now.  -Will need to reschedule outpatient follow-up appointment with urology.  5.  Lung nodule -Incidentally noted on CT scan. -Outpatient follow-up with pulmonary..   DVT prophylaxis: Heparin Code Status: Full Family Communication: Updated patient, son at bedside. Disposition: Likely home when clinically improved.  Status is: Inpatient Remains inpatient appropriate because: Severity of illness   Consultants:  None  Procedures: CT angiogram chest 03/11/2023 Bilateral lower extremity Dopplers  Antimicrobials:  Anti-infectives (From admission, onward)    Start     Dose/Rate Route Frequency Ordered Stop   03/11/23 1530  cefTRIAXone (ROCEPHIN) 2 g in sodium chloride 0.9 % 100 mL IVPB        2 g 200 mL/hr over 30 Minutes Intravenous Every 24 hours 03/11/23 1529            Subjective: Patient laying in bed just returned from radiology.  Denies any chest pain.  States shortness of  Index        RIGHT ATRIUM           Index LA Vol (A2C):   48.9 ml 33.05 ml/m  RA Area:     10.50 cm LA Vol (A4C):   34.1 ml 23.05 ml/m  RA Volume:   19.50 ml  13.18 ml/m LA Biplane Vol: 41.9 ml 28.32 ml/m  AORTIC VALVE AV Area (Vmax):    2.86 cm AV Area (Vmean):   2.57 cm AV Area (VTI):     2.73 cm AV Vmax:            115.00 cm/s AV Vmean:          86.900 cm/s AV VTI:            0.215 m AV Peak Grad:      5.3 mmHg AV Mean Grad:      3.0 mmHg LVOT Vmax:         116.00 cm/s LVOT Vmean:        78.800 cm/s LVOT VTI:          0.207 m LVOT/AV VTI ratio: 0.96 AI PHT:            715 msec  AORTA Ao Root diam: 3.30 cm Ao Asc diam:  3.70 cm MITRAL VALVE                TRICUSPID VALVE MV Area (PHT): 4.46 cm     TR Peak grad:   27.7 mmHg MV Decel Time: 170 msec     TR Vmax:        263.00 cm/s MV E velocity: 58.30 cm/s MV A velocity: 108.00 cm/s  SHUNTS MV E/A ratio:  0.54         Systemic VTI:  0.21 m                             Systemic Diam: 1.90 cm Dalton McleanMD Electronically signed by Wilfred Lacy Signature Date/Time: 03/13/2023/9:10:09 AM    Final (Updated)    VAS [place] LOWER EXTREMITY VENOUS (DVT)  Result Date: 03/12/2023  Lower Venous DVT Study Patient Name:  Becky Leach  Date of Exam:   03/12/2023 Medical Rec #: [id number]      Accession #:    [id number] Date of Birth: [birth year]       Patient Gender: F Patient Age:   62 years Exam Location:  [facility] Procedure:      VAS [place] LOWER EXTREMITY VENOUS (DVT) Referring Phys: Ramiro Harvest --------------------------------------------------------------------------------  Indications: Pulmonary embolism.  Risk Factors: PE & DVT 2018. Comparison Study: Previous exam on 05/12/2017 positive for DVT in CFV & FV Performing Technologist: Jody Hill RVT, RDMS  Examination Guidelines: A complete evaluation includes B-mode imaging, spectral Doppler, color Doppler, and power Doppler as needed of all accessible portions of each vessel. Bilateral testing is considered an integral part of a complete examination. Limited examinations for reoccurring indications may be performed as noted. The reflux portion of the exam is performed with the patient in reverse Trendelenburg.  +---------+---------------+---------+-----------+----------+--------------+ RIGHT     CompressibilityPhasicitySpontaneityPropertiesThrombus Aging +---------+---------------+---------+-----------+----------+--------------+ CFV      None           No       No                   Chronic        +---------+---------------+---------+-----------+----------+--------------+ SFJ  of the Act, 21 U.S.C. section 360bbb-3(b)(1), unless the authorization is terminated or revoked sooner.  Performed at [facility], [address], [place], [place] [address]   Blood Culture (routine x 2)     Status: None (Preliminary result)   Collection Time: 03/11/23  2:20 PM   Specimen: BLOOD  Result Value Ref Range Status   Specimen Description   Final    BLOOD LEFT ANTECUBITAL Performed at [facility], [address], [place], [place] [address]    Special Requests   Final    BOTTLES DRAWN AEROBIC AND ANAEROBIC Blood Culture adequate volume Performed at [facility], [address], [place], [place] [address]    Culture   Final    NO GROWTH 2 DAYS Performed at [facility], [address] [place], [place] [address]    Report Status PENDING  Incomplete  Urine Culture     Status: Abnormal   Collection Time: 03/11/23  2:47 PM   Specimen: Urine, Random  Result Value Ref Range Status   Specimen Description   Final    URINE, RANDOM Performed at [facility], [address] [place], [place] [address]    Special Requests   Final    NONE Reflexed from [id number] Performed at [facility], [address] [place], [place] [address]    Culture >=100,000 COLONIES/mL PROTEUS MIRABILIS (A)  Final   Report Status 03/13/2023 FINAL  Final   Organism ID, Bacteria PROTEUS MIRABILIS (A)  Final      Susceptibility   Proteus mirabilis - MIC*    AMPICILLIN <=2 SENSITIVE Sensitive     CEFAZOLIN <=4 SENSITIVE Sensitive     CEFEPIME <=0.12 SENSITIVE Sensitive     CEFTRIAXONE <=0.25 SENSITIVE Sensitive     CIPROFLOXACIN <=0.25 SENSITIVE Sensitive     GENTAMICIN <=1 SENSITIVE Sensitive     IMIPENEM 4 SENSITIVE Sensitive     NITROFURANTOIN RESISTANT Resistant     TRIMETH/SULFA <=20 SENSITIVE Sensitive     AMPICILLIN/SULBACTAM <=2 SENSITIVE Sensitive     PIP/TAZO <=4 SENSITIVE Sensitive ug/mL    * >=100,000 COLONIES/mL PROTEUS MIRABILIS  Blood Culture (routine x 2)     Status: None (Preliminary result)   Collection Time: 03/11/23  3:52 PM   Specimen: BLOOD  Result Value Ref Range Status   Specimen Description   Final    BLOOD RIGHT ANTECUBITAL Performed at [facility], [address], [place], [place] [address]    Special Requests   Final    BOTTLES DRAWN AEROBIC AND ANAEROBIC Blood Culture adequate volume Performed at [facility], [address] [place], [place] [address]    Culture   Final    NO GROWTH 2 DAYS Performed at [facility], [address] [place], [place]  [address]    Report Status PENDING  Incomplete         Radiology Studies: ECHOCARDIOGRAM COMPLETE  Result Date: 03/13/2023    ECHOCARDIOGRAM REPORT   Patient Name:   Becky Leach Date of Exam: 03/13/2023 Medical Rec #:  [id number]     Height:       65.0 in Accession #:    [id number]    Weight:       101.0 lb Date of Birth:  [birth year]      BSA:          1.479 m Patient Age:    33 years

## 2023-03-13 NOTE — Procedures (Addendum)
I have reviewed and concur with this student's documentation.   Angela Nevin, MA, CCC-SLP Speech Therapy  03/13/2023 1:30 PM   Modified Barium Swallow Study  Patient Details  Name: Becky Leach MRN: [id number] Date of Birth: 19-Apr-1930  Today's Date: 03/13/2023  Modified Barium Swallow completed.  Full report located under Chart Review in the Imaging Section.  History of Present Illness Patient is a 89 y.o. female who presented on 03/12/23 with shortness of breath. Patient noted to have stopped Eliquis on 02/25/23 after recent hematuria. Urinalysis concerning for UTI.  Patient seen in the ED EKG with a sinus tachycardia with PACs, chest x-ray negative for any acute findings, CT angiogram chest done positive for bilateral upper PE. PMH significant for spinal stenosis, CKD 3b, unprovoked PE. MBS completed in 2022 with reccommendation onf Dys 3, nectar thick liquids.   Clinical Impression Patient seen by SLP for modified barium swallow study per hx of dysphagia. Patient presents with an overall pharyngoesophageal dysphagia. No aspiration or significant residuals were present at the time of this evaluation. One instance of penetration to the vocal cords (PAS 5) occurred during swallow of nectar thick liquid. Swallow initiation present at the level of the vallecula. Hyoid excursion and epiglottic inversion both present and adequate for airway protection. Suspected presence of a cricopharyngeal bar that did not significantly impact swallow functionality. Barium tablet was initially administered with thin liquid. Tablet stalled at segment of the upper thoracic esophagus. Nectar thick ineffective at completing transit. Multiple subsequent swallows of puree and solid were required to complete tablet transit. SLP recommending dys 3 (mechanical soft) and thin liquid diet. Medications may be administered crushed in puree. ST will follow up at least once to ensure diet toleration.   Swallow  Evaluation Recommendations Recommendations: PO diet PO Diet Recommendation: Dysphagia 3 (Mechanical soft);Thin liquids (Level 0) Liquid Administration via: Cup;Straw Medication Administration: Crushed with puree Supervision: Patient able to self-feed;Set-up assistance for safety Swallowing strategies  : Slow rate;Small bites/sips Postural changes: Stay upright 30-60 min after meals;Position pt fully upright for meals Oral care recommendations: Oral care BID (2x/day)      Marline Backbone, B.S., Speech Therapy Student   03/13/2023,1:25 PM

## 2023-03-13 NOTE — [place] FL2 (Signed)
[place] MEDICAID FL2 LEVEL OF CARE FORM     IDENTIFICATION  Patient Name: Becky Leach Birthdate: 07-17-1929 Sex: female Admission Date (Current Location): 03/11/2023  [place] and [place] Number:  [facility]nes Bast [id number] N Facility and Address:  [facility],  [address], [place] [address]      Provider Number: [id number]  Attending Physician Name and Address:  Rodolph Bong, MD  Relative Name and Phone Number:  Kenlyn, Stibbe   [phone],    Current Level of Care: Hospital Recommended Level of Care: Skilled Nursing Facility Prior Approval Number:    Date Approved/Denied:   PASRR Number: [id number]  Discharge Plan: SNF    Current Diagnoses: Patient Active Problem List   Diagnosis Date Noted   Acute deep vein thrombosis (DVT) of proximal vein of right lower extremity (HCC) 03/12/2023   Lactic acidosis 03/11/2023   Acute cystitis 03/11/2023   Acute respiratory failure with hypoxia (HCC) 03/11/2023   Acute pulmonary embolism (HCC) 03/11/2023   Lung nodule 03/11/2023   Hematuria 03/04/2023   Overactive bladder 03/19/2021   Weight loss 03/19/2021   TIA (transient ischemic attack) 09/09/2020   Polycythemia 09/09/2020   GERD (gastroesophageal reflux disease) 07/10/2020   CKD (chronic kidney disease) stage 3, GFR 30-59 ml/min (HCC) 07/10/2020   History of hemorrhoids 12/25/2017   Vitamin D deficiency 09/14/2017   At risk for hemorrhage associated with anticoagulation therapy 09/12/2017   Frequent falls 09/12/2017   History of pulmonary embolus (PE) 05/11/2017   Impaired ambulation 01/12/2017   Bilateral primary osteoarthritis of knee 12/26/2016   Pain of left lower extremity 08/28/2014   Spinal stenosis of lumbar region 11/14/2013   AKI (acute kidney injury) (HCC) 02/27/2012   Tachycardia 12/08/2011   Benign essential HTN 12/06/2011    Orientation RESPIRATION BLADDER Height & Weight     Self, Situation, Place  Normal Incontinent Weight:  100 lb 15.5 oz (45.8 kg) Height:  5\' 5"  (165.1 cm)  BEHAVIORAL SYMPTOMS/MOOD NEUROLOGICAL BOWEL NUTRITION STATUS      Continent Diet  AMBULATORY STATUS COMMUNICATION OF NEEDS Skin   Limited Assist Verbally Normal                       Personal Care Assistance Level of Assistance  Bathing, Feeding, Dressing Bathing Assistance: Limited assistance Feeding assistance: Independent Dressing Assistance: Limited assistance     Functional Limitations Info  Sight, Hearing, Speech Sight Info: Adequate Hearing Info: Adequate Speech Info: Adequate    SPECIAL CARE FACTORS FREQUENCY  PT (By licensed PT), OT (By licensed OT)     PT Frequency: Minimum 5x a week OT Frequency: Minimum 5x a week            Contractures Contractures Info: Not present    Additional Factors Info  Code Status, Allergies Code Status Info: Full Code Allergies Info: Nsaids  Penicillins           Current Medications (03/13/2023):  This is the current hospital active medication list Current Facility-Administered Medications  Medication Dose Route Frequency Provider Last Rate Last Admin   acetaminophen (TYLENOL) tablet 650 mg  650 mg Oral Q6H PRN Opyd, Lavone Neri, MD       Or   acetaminophen (TYLENOL) suppository 650 mg  650 mg Rectal Q6H PRN Opyd, Lavone Neri, MD       amLODipine (NORVASC) tablet 2.5 mg  2.5 mg Oral Daily Rodolph Bong, MD   2.5 mg at 03/13/23 1020   atorvastatin (LIPITOR)  tablet 40 mg  40 mg Oral Daily Opyd, Lavone Neri, MD   40 mg at 03/13/23 1020   cefTRIAXone (ROCEPHIN) 2 g in sodium chloride 0.9 % 100 mL IVPB  2 g Intravenous Q24H Opyd, Lavone Neri, MD 200 mL/hr at 03/12/23 1548 2 g at 03/12/23 1548   cholecalciferol (VITAMIN D3) tablet 1,000 Units  1,000 Units Oral Daily Rodolph Bong, MD   1,000 Units at 03/13/23 1020   feeding supplement (ENSURE ENLIVE / ENSURE PLUS) liquid 237 mL  237 mL Oral BID BM Opyd, Lavone Neri, MD   237 mL at 03/13/23 1023   fesoterodine (TOVIAZ) tablet  4 mg  4 mg Oral QHS Opyd, Lavone Neri, MD   4 mg at 03/12/23 2107   heparin ADULT infusion 100 units/mL (25000 units/247mL)  750 Units/hr Intravenous Continuous Cherylin Mylar, RPH 7.5 mL/hr at 03/13/23 0353 750 Units/hr at 03/13/23 0353   hydrALAZINE (APRESOLINE) tablet 25 mg  25 mg Oral Q6H PRN Briscoe Deutscher, MD   25 mg at 03/11/23 2243   latanoprost (XALATAN) 0.005 % ophthalmic solution 1 drop  1 drop Both Eyes QHS Opyd, Lavone Neri, MD   1 drop at 03/12/23 2106   loratadine (CLARITIN) tablet 10 mg  10 mg Oral Daily Rodolph Bong, MD   10 mg at 03/13/23 1020   mirabegron ER (MYRBETRIQ) tablet 50 mg  50 mg Oral Daily Opyd, Lavone Neri, MD   50 mg at 03/13/23 1021   ondansetron (ZOFRAN) tablet 4 mg  4 mg Oral Q6H PRN Opyd, Lavone Neri, MD       Or   ondansetron (ZOFRAN) injection 4 mg  4 mg Intravenous Q6H PRN Opyd, Lavone Neri, MD       pantoprazole (PROTONIX) EC tablet 40 mg  40 mg Oral Daily Opyd, Lavone Neri, MD   40 mg at 03/13/23 1020   senna-docusate (Senokot-S) tablet 1 tablet  1 tablet Oral QHS PRN Opyd, Lavone Neri, MD       sodium chloride flush (NS) 0.9 % injection 3 mL  3 mL Intravenous Q12H Opyd, Lavone Neri, MD         Discharge Medications: Please see discharge summary for a list of discharge medications.  Relevant Imaging Results:  Relevant Lab Results:   Additional Information SSN [id number]  Darleene Cleaver, LCSW

## 2023-03-13 NOTE — Evaluation (Signed)
Occupational Therapy Evaluation Patient Details Name: LYNIX GLENDENING MRN: [id number] DOB: 04-17-1930 Today's Date: 03/13/2023   History of Present Illness Patient is a 89 year old female who presented on 10/12 with increased shortness of breath. patient was admitted with bilateral PE and doppler revealing RLE DVT. PMH: spinal stenosis, CKD III, hematuria.   Clinical Impression   Patient is a 89 year old female who was admitted for above. Patient was living at home with son and caregiver support. Patient reported son is also in the hospital and that caregiver stopped coming last week. Patient is min A for transfers with max A for LB dressing tasks with increased time and CGA to maintain standing balance. Patient would continue to benefit from skilled OT services at this time while admitted and after d/c to address noted deficits in order to improve overall safety and independence in ADLs. Patient will benefit from continued inpatient follow up therapy, <3 hours/day.       If plan is discharge home, recommend the following: A lot of help with bathing/dressing/bathroom;Assistance with cooking/housework;Direct supervision/assist for medications management;Assist for transportation;Help with stairs or ramp for entrance;Direct supervision/assist for financial management;A little help with walking and/or transfers    Functional Status Assessment  Patient has had a recent decline in their functional status and demonstrates the ability to make significant improvements in function in a reasonable and predictable amount of time.  Equipment Recommendations  None recommended by OT       Precautions / Restrictions Precautions Precautions: Fall Precaution Comments: pt denies falls in the past 6 months Restrictions Weight Bearing Restrictions: No      Mobility Bed Mobility Overal bed mobility: Needs Assistance Bed Mobility: Supine to Sit     Supine to sit: Supervision                    Balance Overall balance assessment: Mild deficits observed, not formally tested         ADL either performed or assessed with clinical judgement   ADL Overall ADL's : Needs assistance/impaired Eating/Feeding: Supervision/ safety;Sitting   Grooming: Sitting;Set up   Upper Body Bathing: Sitting;Minimal assistance   Lower Body Bathing: Bed level;Maximal assistance   Upper Body Dressing : Bed level;Minimal assistance   Lower Body Dressing: Bed level;Maximal assistance   Toilet Transfer: Minimal assistance;Ambulation;Rolling walker (2 wheels) Toilet Transfer Details (indicate cue type and reason): to bathroom and back Toileting- Clothing Manipulation and Hygiene: Minimal assistance;Sitting/lateral lean Toileting - Clothing Manipulation Details (indicate cue type and reason): sitting to complete hygiene and standing to pull up undergarments.              Pertinent Vitals/Pain Pain Assessment Pain Assessment: No/denies pain     Extremity/Trunk Assessment Upper Extremity Assessment Upper Extremity Assessment: Overall WFL for tasks assessed   Lower Extremity Assessment Lower Extremity Assessment: Defer to PT evaluation   Cervical / Trunk Assessment Cervical / Trunk Assessment: Kyphotic      Cognition Arousal: Alert Behavior During Therapy: WFL for tasks assessed/performed Overall Cognitive Status: Within Functional Limits for tasks assessed                      Home Living Family/patient expects to be discharged to:: Private residence Living Arrangements: Children Available Help at Discharge: Family;Available 24 hours/day Type of Home: House Home Access: Level entry     Home Layout: One level     Bathroom Shower/Tub: Tub/shower unit  Home Equipment: Agricultural consultant (2 wheels);Shower seat   Additional Comments: lives with son, but he is in the hospital ("he couldn't walk")      Prior Functioning/Environment Prior Level of Function : Needs  assist             Mobility Comments: walks with RW ADLs Comments: had an aide to help with bathing, but aide stopped coming last week        OT Problem List: Decreased activity tolerance;Decreased coordination;Impaired balance (sitting and/or standing);Decreased knowledge of precautions;Decreased safety awareness;Decreased knowledge of use of DME or AE      OT Treatment/Interventions: Self-care/ADL training;Therapeutic exercise;DME and/or AE instruction;Therapeutic activities;Patient/family education    OT Goals(Current goals can be found in the care plan section) Acute Rehab OT Goals Patient Stated Goal: to go home OT Goal Formulation: With patient Time For Goal Achievement: 03/27/23 Potential to Achieve Goals: Fair  OT Frequency: Min 1X/week       AM-PAC OT "6 Clicks" Daily Activity     Outcome Measure Help from another person eating meals?: A Little Help from another person taking care of personal grooming?: A Little Help from another person toileting, which includes using toliet, bedpan, or urinal?: A Lot Help from another person bathing (including washing, rinsing, drying)?: A Lot Help from another person to put on and taking off regular upper body clothing?: A Little Help from another person to put on and taking off regular lower body clothing?: A Lot 6 Click Score: 15   End of Session Equipment Utilized During Treatment: Gait belt;Rolling walker (2 wheels)  Activity Tolerance: Patient tolerated treatment well Patient left: in bed;with call bell/phone within reach;with bed alarm set  OT Visit Diagnosis: Unsteadiness on feet (R26.81);Other abnormalities of gait and mobility (R26.89)                Time: 7829-5621 OT Time Calculation (min): 21 min Charges:  OT General Charges $OT Visit: 1 Visit OT Evaluation $OT Eval Low Complexity: 1 Low  Catalina Salasar OTR/L, MS Acute Rehabilitation Department Office# [phone]   Selinda Flavin 03/13/2023, 3:49 PM

## 2023-03-13 NOTE — Plan of Care (Signed)

## 2023-03-13 NOTE — TOC Progression Note (Addendum)
Transition of Care [facility]) - Progression Note    Patient Details  Name: Becky Leach MRN: [id number] Date of Birth: [birth year]  [facility]) CM/SW Contact  Darleene Cleaver, [place] Phone Number: 03/13/2023, 11:20 AM  Clinical Narrative:     CSW spoke to patient's son [name], he confirmed that his brother Beverely Pace is the son that lives with her and he is currently getting rehab.  Per [name], he said, Beverely Pace is not doing too well, and it may be awhile for him before he is able to continue taking care of patient.  Patient's son [name] gave permission to begin bed search for SNF placement for short term rehab.  Per PT they saw him and initially recommended HH, but now have changed their recommendation to SNF.  Patient does qualify for the [facility] waiver program, patient can go to SNF without a qualifying stay if SNF can accept her.  CSW has begun bed search in [facility], awaiting bed offers.  TOC to continue to follow patient's progress throughout discharge planning.  CSW updated attending physician and PT.  3:00pm CSW provided bed offers below:  [facility]   [facility]   [facility]   [facility]   [facility], [facility] [place], [place]    [facility]   [facility]   [facility], [place]   [facility]   [facility]   [facility]    Patient and son chose [facility] and [place].  CSW notified Starr at [place].       Expected Discharge Plan and Services  SNF for rehab.                                             Social Determinants of Health (SDOH) Interventions SDOH Screenings   Food Insecurity: No Food Insecurity (03/11/2023)  Housing: Low Risk  (03/11/2023)  Transportation Needs: No Transportation Needs (03/11/2023)  Utilities: Not At Risk (03/11/2023)  Depression (PHQ2-9): Medium Risk  (03/19/2021)  Tobacco Use: Low Risk  (03/11/2023)    Readmission Risk Interventions     No data to display

## 2023-03-14 DIAGNOSIS — J9601 Acute respiratory failure with hypoxia: Secondary | ICD-10-CM | POA: Diagnosis not present

## 2023-03-14 DIAGNOSIS — N1832 Chronic kidney disease, stage 3b: Secondary | ICD-10-CM | POA: Diagnosis not present

## 2023-03-14 DIAGNOSIS — N3000 Acute cystitis without hematuria: Secondary | ICD-10-CM | POA: Diagnosis not present

## 2023-03-14 DIAGNOSIS — I2699 Other pulmonary embolism without acute cor pulmonale: Secondary | ICD-10-CM | POA: Diagnosis not present

## 2023-03-14 LAB — BASIC METABOLIC PANEL
Anion gap: 6 (ref 5–15)
BUN: 16 mg/dL (ref 8–23)
CO2: 26 mmol/L (ref 22–32)
Calcium: 9 mg/dL (ref 8.9–10.3)
Chloride: 107 mmol/L (ref 98–111)
Creatinine, Ser: 1.11 mg/dL — ABNORMAL HIGH (ref 0.44–1.00)
GFR, Estimated: 47 mL/min — ABNORMAL LOW (ref >60)
Glucose, Bld: 95 mg/dL (ref 70–99)
Potassium: 3.7 mmol/L (ref 3.5–5.1)
Sodium: 139 mmol/L (ref 135–145)

## 2023-03-14 LAB — CBC
HCT: 41.6 % (ref 36.0–46.0)
Hemoglobin: 14.1 g/dL (ref 12.0–15.0)
MCH: 29.3 pg (ref 26.0–34.0)
MCHC: 33.9 g/dL (ref 30.0–36.0)
MCV: 86.3 fL (ref 80.0–100.0)
Platelets: 179 10*3/uL (ref 150–400)
RBC: 4.82 MIL/uL (ref 3.87–5.11)
RDW: 14.1 % (ref 11.5–15.5)
WBC: 5.1 10*3/uL (ref 4.0–10.5)
nRBC: 0 % (ref 0.0–0.2)

## 2023-03-14 LAB — HEPARIN LEVEL (UNFRACTIONATED): Heparin Unfractionated: 0.6 [IU]/mL (ref 0.30–0.70)

## 2023-03-14 MED ORDER — APIXABAN 5 MG PO TABS: 5.0000 mg | ORAL_TABLET | Freq: Two times a day (BID) | ORAL | Status: DC

## 2023-03-14 MED ORDER — CEFADROXIL 500 MG PO CAPS
500.0000 mg | ORAL_CAPSULE | Freq: Two times a day (BID) | ORAL | Status: DC
  Administered 2023-03-14 – 2023-03-15 (×3): 500 mg via ORAL
  Filled 2023-03-14 (×4): qty 1

## 2023-03-14 MED ORDER — ORAL CARE MOUTH RINSE: 15.0000 mL | OROMUCOSAL | Status: DC | PRN

## 2023-03-14 MED ORDER — APIXABAN 5 MG PO TABS
10.0000 mg | ORAL_TABLET | Freq: Two times a day (BID) | ORAL | Status: DC
  Administered 2023-03-14 – 2023-03-15 (×3): 10 mg via ORAL
  Filled 2023-03-14 (×3): qty 2

## 2023-03-14 NOTE — Progress Notes (Signed)
PHARMACY - ANTICOAGULATION CONSULT NOTE  Pharmacy Consult for apixaban Indication: pulmonary embolus  Allergies  Allergen Reactions   Nsaids Other (See Comments)    "NSAIDS WILL CAUSE BLEEDING ULCERS"- do NOT give   Penicillins Rash and Other (See Comments)    Cephalosporin tolerant given during surgery 08/2017    Patient Measurements: Height: 5\' 5"  (165.1 cm) Weight: 45.8 kg (100 lb 15.5 oz) IBW/kg (Calculated) : 57 Heparin Dosing Weight: TBW  Vital Signs: Temp: 98.6 F (37 C) (10/15 0236) BP: 135/91 (10/15 0236) Pulse Rate: 102 (10/15 0236)  Labs: Recent Labs    03/11/23 1420 03/11/23 1420 03/12/23 0458 03/12/23 1235 03/13/23 0438 03/14/23 0442  HGB 13.6  --  13.8  --  13.4 14.1  HCT 40.9  --  41.8  --  40.4 41.6  PLT 179  --  175  --  176 179  APTT 27  --   --   --   --   --   LABPROT 15.5*  --   --   --   --   --   INR 1.2  --   --   --   --   --   HEPARINUNFRC  --    < > 0.53 0.47 0.59 0.60  CREATININE 1.26*  --  0.98  --  1.03* 1.11*   < > = values in this interval not displayed.    Estimated Creatinine Clearance: 23.9 mL/min (A) (by C-G formula based on SCr of 1.11 mg/dL (H)).   Medical History: Past Medical History:  Diagnosis Date   AKI (acute kidney injury) (HCC)    due to NSAID   Arthritis    Closed fracture of left proximal humerus 09/14/2017   Constipation    DVT (deep venous thrombosis) (HCC)    Herniated disc    Hypertension    Pulmonary embolism (HCC)    Seasonal allergies    Spinal stenosis    Stomach ulcer    due to NSAID   Tachycardia    UTI (lower urinary tract infection)    Vitamin D deficiency 09/14/2017    Medications:  Scheduled:   amLODipine  2.5 mg Oral Daily   atorvastatin  40 mg Oral Daily   cefadroxil  500 mg Oral BID   cholecalciferol  1,000 Units Oral Daily   feeding supplement  237 mL Oral BID BM   fesoterodine  4 mg Oral QHS   latanoprost  1 drop Both Eyes QHS   loratadine  10 mg Oral Daily   mirabegron ER   50 mg Oral Daily   pantoprazole  40 mg Oral Daily   sodium chloride flush  3 mL Intravenous Q12H   Assessment: 89 YO female presenting with shortness of breath, found to have a PE in her bilateral upper pulmonary arteries--no right heart strain. She is on Eliquis PTA for a history of PE but stopped taking it over a week ago (last dose 9/28) due to hematuria. Patient has reportedly not had hematuria since 10/1. Pharmacy consulted for heparin dosing.   Today, 03/14/23 Transitioning to apixaban from heparin infusion. Has been therapeutic for ~3 days. Will start with full dose apixaban treatment since these are new acute clots. CBC stable No s/sx of bleeding or infusion related concerns reported  Goal of Therapy:  Heparin level 0.3-0.7 units/ml Monitor platelets by anticoagulation protocol: Yes   Plan:  Start apixaban 10mg  PO BID and plan for 4 days before transitioning to 5mg  PO BID Stop  heparin infusion when first dose of apixaban given today Monitor CBC, and s/sx of bleeding daily  Depending on plan for discharge day, could consider 10mg  PO BID for a few more days vs transitioning to 5mg  PO BID.  Enzo Bi, PharmD, BCPS, BCIDP Clinical Pharmacist 03/14/2023 8:02 AM

## 2023-03-14 NOTE — Progress Notes (Signed)
within or on pharyngeal structures Location of pharyngeal residue: Valleculae; Pharyngeal wall  Esophageal Impairment Domain: Esophageal Impairment Domain Esophageal clearance upright position: Esophageal retention Pill: Pill Consistency administered: Thin liquids (Level 0); Mildly thick liquids (Level 2, nectar thick); Moderately thick liquids (Level 3, honey thick); Puree Thin liquids (Level 0): Impaired (see clinical impressions) Mildly thick liquids (Level 2, nectar thick): Impaired (see clinical impressions) Moderately thick liquids (Level 3, honey thick): WFL Puree: WFL Penetration/Aspiration Scale Score: Penetration/Aspiration Scale Score 5.  Material enters airway, CONTACTS cords and not ejected out: Mildly thick liquids (Level 2, nectar thick) Compensatory Strategies: Compensatory Strategies Compensatory strategies: No   General Information: Caregiver present: No  Diet Prior to this Study: Dysphagia 3 (mechanical soft); Thin liquids (Level 0)   Temperature : Normal   Respiratory Status: WFL   Supplemental O2: None  (Room air)   History of Recent Intubation: No  Behavior/Cognition: Alert; Cooperative; Pleasant mood Self-Feeding Abilities: Able to self-feed; Needs set-up for self-feeding Baseline vocal quality/speech: Normal No data recorded Volitional Swallow: Able to elicit Exam Limitations: No limitations Goal Planning: Prognosis for improved oropharyngeal function: Good No data recorded No data recorded No data recorded Consulted and agree with results and recommendations: Patient Pain: Pain Assessment Pain Assessment: No/denies pain End of Session: Start Time:SLP Start Time (ACUTE ONLY): 0900 Stop Time: SLP Stop Time (ACUTE ONLY): 0930 Time Calculation:SLP Time Calculation (min) (ACUTE ONLY): 30 min Charges: SLP Evaluations $ SLP Speech Visit: 1 Visit SLP Evaluations $BSS Swallow: 1 Procedure $MBS Swallow: 1 Procedure SLP visit diagnosis: SLP Visit Diagnosis: Dysphagia, unspecified (R13.10) Past Medical History: Past Medical History: Diagnosis Date  AKI (acute kidney injury) (HCC)   due to NSAID  Arthritis   Closed fracture of left proximal humerus 09/14/2017  Constipation   DVT (deep venous thrombosis) (HCC)   Herniated disc   Hypertension   Pulmonary embolism (HCC)   Seasonal allergies   Spinal stenosis   Stomach ulcer   due to NSAID  Tachycardia   UTI (lower urinary tract infection)   Vitamin D deficiency 09/14/2017 Past Surgical History: Past Surgical History: Procedure Laterality Date  COLONOSCOPY  12/21/2011  Procedure: COLONOSCOPY;  Surgeon: [name], MD;  Location: [facility];  Service: Endoscopy;  Laterality: N/A;  DILATION AND CURETTAGE OF UTERUS    ORIF HUMERUS FRACTURE Left 09/12/2017  Procedure: OPEN REDUCTION INTERNAL FIXATION (ORIF) PROXIMAL  HUMERUS FRACTURE;  Surgeon: Myrene Galas, MD;  Location: [facility] OR;  Service: Orthopedics;  Laterality: Left;  TONSILLECTOMY AND ADENOIDECTOMY   Angela Nevin, MA, CCC-SLP Speech Therapy   ECHOCARDIOGRAM COMPLETE  Result Date: 03/13/2023    ECHOCARDIOGRAM REPORT    Patient Name:   Becky Leach Date of Exam: 03/13/2023 Medical Rec #:  [id number]     Height:       65.0 in Accession #:    [id number]    Weight:       101.0 lb Date of Birth:  December 05, 1929      BSA:          1.479 m Patient Age:    89 years      BP:           152/99 mmHg Patient Gender: F             HR:           94 bpm. Exam Location:  Inpatient Procedure: 2D Echo, Cardiac Doppler and Color Doppler Indications:     Pulmonary embolus  History:  Pathology  Result Date: 03/13/2023 Table formatting from the original result was not included. Modified Barium Swallow Study Patient Details Name: Becky Leach MRN: [id number] Date of Birth: 1930/01/13 Today's Date: 03/13/2023 HPI/PMH: HPI: Patient is a 89 y.o. female who presented on 03/12/23 with shortness of breath. Patient noted to have stopped Eliquis on 02/25/23 after recent hematuria. Urinalysis concerning for UTI.  Patient seen in the ED EKG with a sinus tachycardia with PACs, chest x-ray negative for any acute findings, CT angiogram chest done positive for bilateral upper PE. PMH significant for spinal stenosis, CKD 3b, unprovoked PE. MBS completed in 2022 with reccommendation onf Dys 3, nectar thick liquids. Clinical Impression: Clinical Impression: Patient seen by SLP for modified barium swallow study per hx of dysphagia. Patient presents with an overall functional oropharyngeal swallow. No aspiration or significant residuals were present at the time of this evaluation. One instance of penetration to the vocal cords (PAS 5) occured during swallow of nectar thick liquid. Swallow initaition present at the level of the vallecula. Hyoid excusion and epiglottic inversion both present and adequate for airway protection. Barium tablet was initally administered with thin liquid. Tablet stalled at segment of the upper throacic esophagus. Nectar thick ineffective at completing transit. Multplie subsequent swallows of puree and solid were required to  complete tablet transit. SLP recommneding dys 3 (mechanical soft) and thin liquid diet. Medications may be administered crushed in puree. ST will follow up at least once to ensure diet toleration. Recommendations/Plan: Swallowing Evaluation Recommendations Swallowing Evaluation Recommendations Recommendations: PO diet PO Diet Recommendation: Dysphagia 3 (Mechanical soft); Thin liquids (Level 0) Liquid Administration via: Cup; Straw Medication Administration: Crushed with puree Supervision: Patient able to self-feed; Set-up assistance for safety Swallowing strategies  : Slow rate; Small bites/sips Postural changes: Stay upright 30-60 min after meals; Position pt fully upright for meals Oral care recommendations: Oral care BID (2x/day) Treatment Plan Treatment Plan Treatment recommendations: No treatment recommended at this time Follow-up recommendations: No SLP follow up Functional status assessment: Patient has not had a recent decline in their functional status. Recommendations Recommendations for follow up therapy are one component of a multi-disciplinary discharge planning process, led by the attending physician.  Recommendations may be updated based on patient status, additional functional criteria and insurance authorization. Assessment: Orofacial Exam: Orofacial Exam Oral Cavity: Oral Hygiene: WFL Oral Cavity - Dentition: Edentulous Orofacial Anatomy: WFL Oral Motor/Sensory Function: WFL Anatomy: Anatomy: Prominent cricopharyngeus Boluses Administered: Boluses Administered Boluses Administered: Thin liquids (Level 0); Mildly thick liquids (Level 2, nectar thick); Moderately thick liquids (Level 3, honey thick); Puree; Solid  Oral Impairment Domain: Oral Impairment Domain Lip Closure: No labial escape Tongue control during bolus hold: Cohesive bolus between tongue to palatal seal Bolus preparation/mastication: Slow prolonged chewing/mashing with complete recollection Bolus transport/lingual motion: Brisk tongue  motion Oral residue: Trace residue lining oral structures Location of oral residue : Palate; Floor of mouth Initiation of pharyngeal swallow : Valleculae  Pharyngeal Impairment Domain: Pharyngeal Impairment Domain Soft palate elevation: No bolus between soft palate (SP)/pharyngeal wall (PW) Laryngeal elevation: Complete superior movement of thyroid cartilage with complete approximation of arytenoids to epiglottic petiole Anterior hyoid excursion: Complete anterior movement Epiglottic movement: Complete inversion Laryngeal vestibule closure: Complete, no air/contrast in laryngeal vestibule Pharyngeal stripping wave : Present - complete Pharyngeal contraction (A/P view only): N/A Pharyngoesophageal segment opening: Complete distension and complete duration, no obstruction of flow Tongue base retraction: Trace column of contrast or air between tongue base and PPW Pharyngeal residue: Trace residue  PROGRESS NOTE    Becky Leach  WUJ:[id number] DOB: 02-08-1930 DOA: 03/11/2023 PCP: Erick Alley, DO    No chief complaint on file.   Brief Narrative:  Patient pleasant 89 year old female history of spinal stenosis, CKD 3B, unprovoked PE in 2018 was on chronic anticoagulation, recent hematuria for which patient has been off Eliquis for approximately 2 weeks presenting to the ED with shortness of breath.  Patient noted to have stopped Eliquis on 02/25/2023 and has not had any gross hematuria since 02/28/2023 however became more short of breath.  Patient with nonproductive cough.  Patient denies any change in chronic leg swelling.  Patient also endorsed dysuria.  Urinalysis concerning for UTI.  Patient seen in the ED EKG with a sinus tachycardia with PACs, chest x-ray negative for any acute findings, CT angiogram chest done positive for bilateral upper PE.  Patient admitted, placed on IV heparin, 2D echo with no right ventricular strain, lower extremity Dopplers with right lower extremity DVT.  Patient on IV heparin transition to Eliquis IV antibiotics transition to oral antibiotics.     Assessment & Plan:   Principal Problem:   Acute respiratory failure with hypoxia (HCC) Active Problems:   History of pulmonary embolus (PE)   CKD (chronic kidney disease) stage 3, GFR 30-59 ml/min (HCC)   Hematuria   Lactic acidosis   Acute cystitis   Acute pulmonary embolism (HCC)   Lung nodule   Acute deep vein thrombosis (DVT) of proximal vein of right lower extremity (HCC)  #1 acute respiratory failure with hypoxia secondary to acute bilateral PE/extensive right lower extremity DVT -Patient presenting with acute shortness of breath after discontinuation of Eliquis 02/25/2023 due to hematuria. -Patient noted with no further hematuria 02/28/2023. -Chest x-ray done negative for any acute abnormalities. -CT angiogram chest done with bilateral upper pulmonary artery PE without right heart strain. -Patient  with clinical improvement after being started on IV heparin. -Lower extremity Dopplers ordered with RLE DVT in distal EIV, CFV, SFJ, FV, PFV, popliteal vein. -2D echo with EF of 60 to 65%, grade 1 diastolic dysfunction,NWMA, normal right ventricle with low normal right ventricular systolic function, no increasing right ventricular wall thickness. -Patient with no bleeding, no gross hematuria noted. -Transition from IV heparin to Eliquis. -Outpatient follow-up with primary pulmonologist, Dr. Isaiah Serge.  2.  Proteus Mirabilis UTI -Patient presented with urinary symptoms of dysuria which has improved per patient.. -Urinalysis concerning for UTI. -Urine cultures consistent with >100,000 colonies of Proteus Mirabilis, -Sensitivities from urine culture resistant to Macrobid otherwise pansensitive.  -Transition from IV Rocephin to Duricef to complete a course of antibiotic treatment.   3.  CKD stage IIIb -Stable.  4.  Gross hematuria -No further gross hematuria after stopping Eliquis. -On IV heparin with no hematuria noted.  -Will need to reschedule outpatient follow-up appointment with urology.  5.  Lung nodule -Incidentally noted on CT scan. -Outpatient follow-up with pulmonary..   DVT prophylaxis: Heparin>>>>> Eliquis Code Status: Full Family Communication: Updated patient, no family at bedside.  Disposition: Medically stable for discharge.  SNF pending.   Status is: Inpatient Remains inpatient appropriate because: Severity of illness   Consultants:  None  Procedures: CT angiogram chest 03/11/2023 Bilateral lower extremity Dopplers 2D echo 03/13/2023   Antimicrobials:  Anti-infectives (From admission, onward)    Start     Dose/Rate Route Frequency Ordered Stop   03/14/23 1000  cefadroxil (DURICEF) capsule 500 mg        500 mg Oral 2 times daily  Pathology  Result Date: 03/13/2023 Table formatting from the original result was not included. Modified Barium Swallow Study Patient Details Name: Becky Leach MRN: [id number] Date of Birth: 1930/01/13 Today's Date: 03/13/2023 HPI/PMH: HPI: Patient is a 89 y.o. female who presented on 03/12/23 with shortness of breath. Patient noted to have stopped Eliquis on 02/25/23 after recent hematuria. Urinalysis concerning for UTI.  Patient seen in the ED EKG with a sinus tachycardia with PACs, chest x-ray negative for any acute findings, CT angiogram chest done positive for bilateral upper PE. PMH significant for spinal stenosis, CKD 3b, unprovoked PE. MBS completed in 2022 with reccommendation onf Dys 3, nectar thick liquids. Clinical Impression: Clinical Impression: Patient seen by SLP for modified barium swallow study per hx of dysphagia. Patient presents with an overall functional oropharyngeal swallow. No aspiration or significant residuals were present at the time of this evaluation. One instance of penetration to the vocal cords (PAS 5) occured during swallow of nectar thick liquid. Swallow initaition present at the level of the vallecula. Hyoid excusion and epiglottic inversion both present and adequate for airway protection. Barium tablet was initally administered with thin liquid. Tablet stalled at segment of the upper throacic esophagus. Nectar thick ineffective at completing transit. Multplie subsequent swallows of puree and solid were required to  complete tablet transit. SLP recommneding dys 3 (mechanical soft) and thin liquid diet. Medications may be administered crushed in puree. ST will follow up at least once to ensure diet toleration. Recommendations/Plan: Swallowing Evaluation Recommendations Swallowing Evaluation Recommendations Recommendations: PO diet PO Diet Recommendation: Dysphagia 3 (Mechanical soft); Thin liquids (Level 0) Liquid Administration via: Cup; Straw Medication Administration: Crushed with puree Supervision: Patient able to self-feed; Set-up assistance for safety Swallowing strategies  : Slow rate; Small bites/sips Postural changes: Stay upright 30-60 min after meals; Position pt fully upright for meals Oral care recommendations: Oral care BID (2x/day) Treatment Plan Treatment Plan Treatment recommendations: No treatment recommended at this time Follow-up recommendations: No SLP follow up Functional status assessment: Patient has not had a recent decline in their functional status. Recommendations Recommendations for follow up therapy are one component of a multi-disciplinary discharge planning process, led by the attending physician.  Recommendations may be updated based on patient status, additional functional criteria and insurance authorization. Assessment: Orofacial Exam: Orofacial Exam Oral Cavity: Oral Hygiene: WFL Oral Cavity - Dentition: Edentulous Orofacial Anatomy: WFL Oral Motor/Sensory Function: WFL Anatomy: Anatomy: Prominent cricopharyngeus Boluses Administered: Boluses Administered Boluses Administered: Thin liquids (Level 0); Mildly thick liquids (Level 2, nectar thick); Moderately thick liquids (Level 3, honey thick); Puree; Solid  Oral Impairment Domain: Oral Impairment Domain Lip Closure: No labial escape Tongue control during bolus hold: Cohesive bolus between tongue to palatal seal Bolus preparation/mastication: Slow prolonged chewing/mashing with complete recollection Bolus transport/lingual motion: Brisk tongue  motion Oral residue: Trace residue lining oral structures Location of oral residue : Palate; Floor of mouth Initiation of pharyngeal swallow : Valleculae  Pharyngeal Impairment Domain: Pharyngeal Impairment Domain Soft palate elevation: No bolus between soft palate (SP)/pharyngeal wall (PW) Laryngeal elevation: Complete superior movement of thyroid cartilage with complete approximation of arytenoids to epiglottic petiole Anterior hyoid excursion: Complete anterior movement Epiglottic movement: Complete inversion Laryngeal vestibule closure: Complete, no air/contrast in laryngeal vestibule Pharyngeal stripping wave : Present - complete Pharyngeal contraction (A/P view only): N/A Pharyngoesophageal segment opening: Complete distension and complete duration, no obstruction of flow Tongue base retraction: Trace column of contrast or air between tongue base and PPW Pharyngeal residue: Trace residue  Pathology  Result Date: 03/13/2023 Table formatting from the original result was not included. Modified Barium Swallow Study Patient Details Name: Becky Leach MRN: [id number] Date of Birth: 1930/01/13 Today's Date: 03/13/2023 HPI/PMH: HPI: Patient is a 89 y.o. female who presented on 03/12/23 with shortness of breath. Patient noted to have stopped Eliquis on 02/25/23 after recent hematuria. Urinalysis concerning for UTI.  Patient seen in the ED EKG with a sinus tachycardia with PACs, chest x-ray negative for any acute findings, CT angiogram chest done positive for bilateral upper PE. PMH significant for spinal stenosis, CKD 3b, unprovoked PE. MBS completed in 2022 with reccommendation onf Dys 3, nectar thick liquids. Clinical Impression: Clinical Impression: Patient seen by SLP for modified barium swallow study per hx of dysphagia. Patient presents with an overall functional oropharyngeal swallow. No aspiration or significant residuals were present at the time of this evaluation. One instance of penetration to the vocal cords (PAS 5) occured during swallow of nectar thick liquid. Swallow initaition present at the level of the vallecula. Hyoid excusion and epiglottic inversion both present and adequate for airway protection. Barium tablet was initally administered with thin liquid. Tablet stalled at segment of the upper throacic esophagus. Nectar thick ineffective at completing transit. Multplie subsequent swallows of puree and solid were required to  complete tablet transit. SLP recommneding dys 3 (mechanical soft) and thin liquid diet. Medications may be administered crushed in puree. ST will follow up at least once to ensure diet toleration. Recommendations/Plan: Swallowing Evaluation Recommendations Swallowing Evaluation Recommendations Recommendations: PO diet PO Diet Recommendation: Dysphagia 3 (Mechanical soft); Thin liquids (Level 0) Liquid Administration via: Cup; Straw Medication Administration: Crushed with puree Supervision: Patient able to self-feed; Set-up assistance for safety Swallowing strategies  : Slow rate; Small bites/sips Postural changes: Stay upright 30-60 min after meals; Position pt fully upright for meals Oral care recommendations: Oral care BID (2x/day) Treatment Plan Treatment Plan Treatment recommendations: No treatment recommended at this time Follow-up recommendations: No SLP follow up Functional status assessment: Patient has not had a recent decline in their functional status. Recommendations Recommendations for follow up therapy are one component of a multi-disciplinary discharge planning process, led by the attending physician.  Recommendations may be updated based on patient status, additional functional criteria and insurance authorization. Assessment: Orofacial Exam: Orofacial Exam Oral Cavity: Oral Hygiene: WFL Oral Cavity - Dentition: Edentulous Orofacial Anatomy: WFL Oral Motor/Sensory Function: WFL Anatomy: Anatomy: Prominent cricopharyngeus Boluses Administered: Boluses Administered Boluses Administered: Thin liquids (Level 0); Mildly thick liquids (Level 2, nectar thick); Moderately thick liquids (Level 3, honey thick); Puree; Solid  Oral Impairment Domain: Oral Impairment Domain Lip Closure: No labial escape Tongue control during bolus hold: Cohesive bolus between tongue to palatal seal Bolus preparation/mastication: Slow prolonged chewing/mashing with complete recollection Bolus transport/lingual motion: Brisk tongue  motion Oral residue: Trace residue lining oral structures Location of oral residue : Palate; Floor of mouth Initiation of pharyngeal swallow : Valleculae  Pharyngeal Impairment Domain: Pharyngeal Impairment Domain Soft palate elevation: No bolus between soft palate (SP)/pharyngeal wall (PW) Laryngeal elevation: Complete superior movement of thyroid cartilage with complete approximation of arytenoids to epiglottic petiole Anterior hyoid excursion: Complete anterior movement Epiglottic movement: Complete inversion Laryngeal vestibule closure: Complete, no air/contrast in laryngeal vestibule Pharyngeal stripping wave : Present - complete Pharyngeal contraction (A/P view only): N/A Pharyngoesophageal segment opening: Complete distension and complete duration, no obstruction of flow Tongue base retraction: Trace column of contrast or air between tongue base and PPW Pharyngeal residue: Trace residue  Pathology  Result Date: 03/13/2023 Table formatting from the original result was not included. Modified Barium Swallow Study Patient Details Name: Becky Leach MRN: [id number] Date of Birth: 1930/01/13 Today's Date: 03/13/2023 HPI/PMH: HPI: Patient is a 89 y.o. female who presented on 03/12/23 with shortness of breath. Patient noted to have stopped Eliquis on 02/25/23 after recent hematuria. Urinalysis concerning for UTI.  Patient seen in the ED EKG with a sinus tachycardia with PACs, chest x-ray negative for any acute findings, CT angiogram chest done positive for bilateral upper PE. PMH significant for spinal stenosis, CKD 3b, unprovoked PE. MBS completed in 2022 with reccommendation onf Dys 3, nectar thick liquids. Clinical Impression: Clinical Impression: Patient seen by SLP for modified barium swallow study per hx of dysphagia. Patient presents with an overall functional oropharyngeal swallow. No aspiration or significant residuals were present at the time of this evaluation. One instance of penetration to the vocal cords (PAS 5) occured during swallow of nectar thick liquid. Swallow initaition present at the level of the vallecula. Hyoid excusion and epiglottic inversion both present and adequate for airway protection. Barium tablet was initally administered with thin liquid. Tablet stalled at segment of the upper throacic esophagus. Nectar thick ineffective at completing transit. Multplie subsequent swallows of puree and solid were required to  complete tablet transit. SLP recommneding dys 3 (mechanical soft) and thin liquid diet. Medications may be administered crushed in puree. ST will follow up at least once to ensure diet toleration. Recommendations/Plan: Swallowing Evaluation Recommendations Swallowing Evaluation Recommendations Recommendations: PO diet PO Diet Recommendation: Dysphagia 3 (Mechanical soft); Thin liquids (Level 0) Liquid Administration via: Cup; Straw Medication Administration: Crushed with puree Supervision: Patient able to self-feed; Set-up assistance for safety Swallowing strategies  : Slow rate; Small bites/sips Postural changes: Stay upright 30-60 min after meals; Position pt fully upright for meals Oral care recommendations: Oral care BID (2x/day) Treatment Plan Treatment Plan Treatment recommendations: No treatment recommended at this time Follow-up recommendations: No SLP follow up Functional status assessment: Patient has not had a recent decline in their functional status. Recommendations Recommendations for follow up therapy are one component of a multi-disciplinary discharge planning process, led by the attending physician.  Recommendations may be updated based on patient status, additional functional criteria and insurance authorization. Assessment: Orofacial Exam: Orofacial Exam Oral Cavity: Oral Hygiene: WFL Oral Cavity - Dentition: Edentulous Orofacial Anatomy: WFL Oral Motor/Sensory Function: WFL Anatomy: Anatomy: Prominent cricopharyngeus Boluses Administered: Boluses Administered Boluses Administered: Thin liquids (Level 0); Mildly thick liquids (Level 2, nectar thick); Moderately thick liquids (Level 3, honey thick); Puree; Solid  Oral Impairment Domain: Oral Impairment Domain Lip Closure: No labial escape Tongue control during bolus hold: Cohesive bolus between tongue to palatal seal Bolus preparation/mastication: Slow prolonged chewing/mashing with complete recollection Bolus transport/lingual motion: Brisk tongue  motion Oral residue: Trace residue lining oral structures Location of oral residue : Palate; Floor of mouth Initiation of pharyngeal swallow : Valleculae  Pharyngeal Impairment Domain: Pharyngeal Impairment Domain Soft palate elevation: No bolus between soft palate (SP)/pharyngeal wall (PW) Laryngeal elevation: Complete superior movement of thyroid cartilage with complete approximation of arytenoids to epiglottic petiole Anterior hyoid excursion: Complete anterior movement Epiglottic movement: Complete inversion Laryngeal vestibule closure: Complete, no air/contrast in laryngeal vestibule Pharyngeal stripping wave : Present - complete Pharyngeal contraction (A/P view only): N/A Pharyngoesophageal segment opening: Complete distension and complete duration, no obstruction of flow Tongue base retraction: Trace column of contrast or air between tongue base and PPW Pharyngeal residue: Trace residue  Pathology  Result Date: 03/13/2023 Table formatting from the original result was not included. Modified Barium Swallow Study Patient Details Name: Becky Leach MRN: [id number] Date of Birth: 1930/01/13 Today's Date: 03/13/2023 HPI/PMH: HPI: Patient is a 89 y.o. female who presented on 03/12/23 with shortness of breath. Patient noted to have stopped Eliquis on 02/25/23 after recent hematuria. Urinalysis concerning for UTI.  Patient seen in the ED EKG with a sinus tachycardia with PACs, chest x-ray negative for any acute findings, CT angiogram chest done positive for bilateral upper PE. PMH significant for spinal stenosis, CKD 3b, unprovoked PE. MBS completed in 2022 with reccommendation onf Dys 3, nectar thick liquids. Clinical Impression: Clinical Impression: Patient seen by SLP for modified barium swallow study per hx of dysphagia. Patient presents with an overall functional oropharyngeal swallow. No aspiration or significant residuals were present at the time of this evaluation. One instance of penetration to the vocal cords (PAS 5) occured during swallow of nectar thick liquid. Swallow initaition present at the level of the vallecula. Hyoid excusion and epiglottic inversion both present and adequate for airway protection. Barium tablet was initally administered with thin liquid. Tablet stalled at segment of the upper throacic esophagus. Nectar thick ineffective at completing transit. Multplie subsequent swallows of puree and solid were required to  complete tablet transit. SLP recommneding dys 3 (mechanical soft) and thin liquid diet. Medications may be administered crushed in puree. ST will follow up at least once to ensure diet toleration. Recommendations/Plan: Swallowing Evaluation Recommendations Swallowing Evaluation Recommendations Recommendations: PO diet PO Diet Recommendation: Dysphagia 3 (Mechanical soft); Thin liquids (Level 0) Liquid Administration via: Cup; Straw Medication Administration: Crushed with puree Supervision: Patient able to self-feed; Set-up assistance for safety Swallowing strategies  : Slow rate; Small bites/sips Postural changes: Stay upright 30-60 min after meals; Position pt fully upright for meals Oral care recommendations: Oral care BID (2x/day) Treatment Plan Treatment Plan Treatment recommendations: No treatment recommended at this time Follow-up recommendations: No SLP follow up Functional status assessment: Patient has not had a recent decline in their functional status. Recommendations Recommendations for follow up therapy are one component of a multi-disciplinary discharge planning process, led by the attending physician.  Recommendations may be updated based on patient status, additional functional criteria and insurance authorization. Assessment: Orofacial Exam: Orofacial Exam Oral Cavity: Oral Hygiene: WFL Oral Cavity - Dentition: Edentulous Orofacial Anatomy: WFL Oral Motor/Sensory Function: WFL Anatomy: Anatomy: Prominent cricopharyngeus Boluses Administered: Boluses Administered Boluses Administered: Thin liquids (Level 0); Mildly thick liquids (Level 2, nectar thick); Moderately thick liquids (Level 3, honey thick); Puree; Solid  Oral Impairment Domain: Oral Impairment Domain Lip Closure: No labial escape Tongue control during bolus hold: Cohesive bolus between tongue to palatal seal Bolus preparation/mastication: Slow prolonged chewing/mashing with complete recollection Bolus transport/lingual motion: Brisk tongue  motion Oral residue: Trace residue lining oral structures Location of oral residue : Palate; Floor of mouth Initiation of pharyngeal swallow : Valleculae  Pharyngeal Impairment Domain: Pharyngeal Impairment Domain Soft palate elevation: No bolus between soft palate (SP)/pharyngeal wall (PW) Laryngeal elevation: Complete superior movement of thyroid cartilage with complete approximation of arytenoids to epiglottic petiole Anterior hyoid excursion: Complete anterior movement Epiglottic movement: Complete inversion Laryngeal vestibule closure: Complete, no air/contrast in laryngeal vestibule Pharyngeal stripping wave : Present - complete Pharyngeal contraction (A/P view only): N/A Pharyngoesophageal segment opening: Complete distension and complete duration, no obstruction of flow Tongue base retraction: Trace column of contrast or air between tongue base and PPW Pharyngeal residue: Trace residue  PROGRESS NOTE    Becky Leach  WUJ:[id number] DOB: 02-08-1930 DOA: 03/11/2023 PCP: Erick Alley, DO    No chief complaint on file.   Brief Narrative:  Patient pleasant 89 year old female history of spinal stenosis, CKD 3B, unprovoked PE in 2018 was on chronic anticoagulation, recent hematuria for which patient has been off Eliquis for approximately 2 weeks presenting to the ED with shortness of breath.  Patient noted to have stopped Eliquis on 02/25/2023 and has not had any gross hematuria since 02/28/2023 however became more short of breath.  Patient with nonproductive cough.  Patient denies any change in chronic leg swelling.  Patient also endorsed dysuria.  Urinalysis concerning for UTI.  Patient seen in the ED EKG with a sinus tachycardia with PACs, chest x-ray negative for any acute findings, CT angiogram chest done positive for bilateral upper PE.  Patient admitted, placed on IV heparin, 2D echo with no right ventricular strain, lower extremity Dopplers with right lower extremity DVT.  Patient on IV heparin transition to Eliquis IV antibiotics transition to oral antibiotics.     Assessment & Plan:   Principal Problem:   Acute respiratory failure with hypoxia (HCC) Active Problems:   History of pulmonary embolus (PE)   CKD (chronic kidney disease) stage 3, GFR 30-59 ml/min (HCC)   Hematuria   Lactic acidosis   Acute cystitis   Acute pulmonary embolism (HCC)   Lung nodule   Acute deep vein thrombosis (DVT) of proximal vein of right lower extremity (HCC)  #1 acute respiratory failure with hypoxia secondary to acute bilateral PE/extensive right lower extremity DVT -Patient presenting with acute shortness of breath after discontinuation of Eliquis 02/25/2023 due to hematuria. -Patient noted with no further hematuria 02/28/2023. -Chest x-ray done negative for any acute abnormalities. -CT angiogram chest done with bilateral upper pulmonary artery PE without right heart strain. -Patient  with clinical improvement after being started on IV heparin. -Lower extremity Dopplers ordered with RLE DVT in distal EIV, CFV, SFJ, FV, PFV, popliteal vein. -2D echo with EF of 60 to 65%, grade 1 diastolic dysfunction,NWMA, normal right ventricle with low normal right ventricular systolic function, no increasing right ventricular wall thickness. -Patient with no bleeding, no gross hematuria noted. -Transition from IV heparin to Eliquis. -Outpatient follow-up with primary pulmonologist, Dr. Isaiah Serge.  2.  Proteus Mirabilis UTI -Patient presented with urinary symptoms of dysuria which has improved per patient.. -Urinalysis concerning for UTI. -Urine cultures consistent with >100,000 colonies of Proteus Mirabilis, -Sensitivities from urine culture resistant to Macrobid otherwise pansensitive.  -Transition from IV Rocephin to Duricef to complete a course of antibiotic treatment.   3.  CKD stage IIIb -Stable.  4.  Gross hematuria -No further gross hematuria after stopping Eliquis. -On IV heparin with no hematuria noted.  -Will need to reschedule outpatient follow-up appointment with urology.  5.  Lung nodule -Incidentally noted on CT scan. -Outpatient follow-up with pulmonary..   DVT prophylaxis: Heparin>>>>> Eliquis Code Status: Full Family Communication: Updated patient, no family at bedside.  Disposition: Medically stable for discharge.  SNF pending.   Status is: Inpatient Remains inpatient appropriate because: Severity of illness   Consultants:  None  Procedures: CT angiogram chest 03/11/2023 Bilateral lower extremity Dopplers 2D echo 03/13/2023   Antimicrobials:  Anti-infectives (From admission, onward)    Start     Dose/Rate Route Frequency Ordered Stop   03/14/23 1000  cefadroxil (DURICEF) capsule 500 mg        500 mg Oral 2 times daily  41.9 ml 28.32 ml/m  AORTIC VALVE AV Area (Vmax):    2.86 cm AV Area (Vmean):   2.57 cm AV Area (VTI):     2.73 cm AV Vmax:           115.00 cm/s AV Vmean:          86.900 cm/s AV VTI:            0.215 m AV Peak Grad:      5.3 mmHg AV Mean Grad:      3.0 mmHg LVOT Vmax:          116.00 cm/s LVOT Vmean:        78.800 cm/s LVOT VTI:          0.207 m LVOT/AV VTI ratio: 0.96 AI PHT:            715 msec  AORTA Ao Root diam: 3.30 cm Ao Asc diam:  3.70 cm MITRAL VALVE                TRICUSPID VALVE MV Area (PHT): 4.46 cm     TR Peak grad:   27.7 mmHg MV Decel Time: 170 msec     TR Vmax:        263.00 cm/s MV E velocity: 58.30 cm/s MV A velocity: 108.00 cm/s  SHUNTS MV E/A ratio:  0.54         Systemic VTI:  0.21 m                             Systemic Diam: 1.90 cm Dalton McleanMD Electronically signed by Wilfred Lacy Signature Date/Time: 03/13/2023/9:10:09 AM    Final (Updated)    VAS [place] LOWER EXTREMITY VENOUS (DVT)  Result Date: 03/12/2023  Lower Venous DVT Study Patient Name:  Becky Leach  Date of Exam:   03/12/2023 Medical Rec #: [id number]      Accession #:    [id number] Date of Birth: May 13, 1930       Patient Gender: F Patient Age:   80 years Exam Location:  [facility] Procedure:      VAS [place] LOWER EXTREMITY VENOUS (DVT) Referring Phys: Ramiro Harvest --------------------------------------------------------------------------------  Indications: Pulmonary embolism.  Risk Factors: PE & DVT 2018. Comparison Study: Previous exam on 05/12/2017 positive for DVT in CFV & FV Performing Technologist: Jody Hill RVT, RDMS  Examination Guidelines: A complete evaluation includes B-mode imaging, spectral Doppler, color Doppler, and power Doppler as needed of all accessible portions of each vessel. Bilateral testing is considered an integral part of a complete examination. Limited examinations for reoccurring indications may be performed as noted. The reflux portion of the exam is performed with the patient in reverse Trendelenburg.  +---------+---------------+---------+-----------+----------+--------------+ RIGHT    CompressibilityPhasicitySpontaneityPropertiesThrombus Aging +---------+---------------+---------+-----------+----------+--------------+ CFV      None           No        No                   Chronic        +---------+---------------+---------+-----------+----------+--------------+ SFJ      Partial                                      Acute          +---------+---------------+---------+-----------+----------+--------------+ FV Prox  Partial  41.9 ml 28.32 ml/m  AORTIC VALVE AV Area (Vmax):    2.86 cm AV Area (Vmean):   2.57 cm AV Area (VTI):     2.73 cm AV Vmax:           115.00 cm/s AV Vmean:          86.900 cm/s AV VTI:            0.215 m AV Peak Grad:      5.3 mmHg AV Mean Grad:      3.0 mmHg LVOT Vmax:          116.00 cm/s LVOT Vmean:        78.800 cm/s LVOT VTI:          0.207 m LVOT/AV VTI ratio: 0.96 AI PHT:            715 msec  AORTA Ao Root diam: 3.30 cm Ao Asc diam:  3.70 cm MITRAL VALVE                TRICUSPID VALVE MV Area (PHT): 4.46 cm     TR Peak grad:   27.7 mmHg MV Decel Time: 170 msec     TR Vmax:        263.00 cm/s MV E velocity: 58.30 cm/s MV A velocity: 108.00 cm/s  SHUNTS MV E/A ratio:  0.54         Systemic VTI:  0.21 m                             Systemic Diam: 1.90 cm Dalton McleanMD Electronically signed by Wilfred Lacy Signature Date/Time: 03/13/2023/9:10:09 AM    Final (Updated)    VAS [place] LOWER EXTREMITY VENOUS (DVT)  Result Date: 03/12/2023  Lower Venous DVT Study Patient Name:  Becky Leach  Date of Exam:   03/12/2023 Medical Rec #: [id number]      Accession #:    [id number] Date of Birth: May 13, 1930       Patient Gender: F Patient Age:   80 years Exam Location:  [facility] Procedure:      VAS [place] LOWER EXTREMITY VENOUS (DVT) Referring Phys: Ramiro Harvest --------------------------------------------------------------------------------  Indications: Pulmonary embolism.  Risk Factors: PE & DVT 2018. Comparison Study: Previous exam on 05/12/2017 positive for DVT in CFV & FV Performing Technologist: Jody Hill RVT, RDMS  Examination Guidelines: A complete evaluation includes B-mode imaging, spectral Doppler, color Doppler, and power Doppler as needed of all accessible portions of each vessel. Bilateral testing is considered an integral part of a complete examination. Limited examinations for reoccurring indications may be performed as noted. The reflux portion of the exam is performed with the patient in reverse Trendelenburg.  +---------+---------------+---------+-----------+----------+--------------+ RIGHT    CompressibilityPhasicitySpontaneityPropertiesThrombus Aging +---------+---------------+---------+-----------+----------+--------------+ CFV      None           No        No                   Chronic        +---------+---------------+---------+-----------+----------+--------------+ SFJ      Partial                                      Acute          +---------+---------------+---------+-----------+----------+--------------+ FV Prox  Partial  within or on pharyngeal structures Location of pharyngeal residue: Valleculae; Pharyngeal wall  Esophageal Impairment Domain: Esophageal Impairment Domain Esophageal clearance upright position: Esophageal retention Pill: Pill Consistency administered: Thin liquids (Level 0); Mildly thick liquids (Level 2, nectar thick); Moderately thick liquids (Level 3, honey thick); Puree Thin liquids (Level 0): Impaired (see clinical impressions) Mildly thick liquids (Level 2, nectar thick): Impaired (see clinical impressions) Moderately thick liquids (Level 3, honey thick): WFL Puree: WFL Penetration/Aspiration Scale Score: Penetration/Aspiration Scale Score 5.  Material enters airway, CONTACTS cords and not ejected out: Mildly thick liquids (Level 2, nectar thick) Compensatory Strategies: Compensatory Strategies Compensatory strategies: No   General Information: Caregiver present: No  Diet Prior to this Study: Dysphagia 3 (mechanical soft); Thin liquids (Level 0)   Temperature : Normal   Respiratory Status: WFL   Supplemental O2: None  (Room air)   History of Recent Intubation: No  Behavior/Cognition: Alert; Cooperative; Pleasant mood Self-Feeding Abilities: Able to self-feed; Needs set-up for self-feeding Baseline vocal quality/speech: Normal No data recorded Volitional Swallow: Able to elicit Exam Limitations: No limitations Goal Planning: Prognosis for improved oropharyngeal function: Good No data recorded No data recorded No data recorded Consulted and agree with results and recommendations: Patient Pain: Pain Assessment Pain Assessment: No/denies pain End of Session: Start Time:SLP Start Time (ACUTE ONLY): 0900 Stop Time: SLP Stop Time (ACUTE ONLY): 0930 Time Calculation:SLP Time Calculation (min) (ACUTE ONLY): 30 min Charges: SLP Evaluations $ SLP Speech Visit: 1 Visit SLP Evaluations $BSS Swallow: 1 Procedure $MBS Swallow: 1 Procedure SLP visit diagnosis: SLP Visit Diagnosis: Dysphagia, unspecified (R13.10) Past Medical History: Past Medical History: Diagnosis Date  AKI (acute kidney injury) (HCC)   due to NSAID  Arthritis   Closed fracture of left proximal humerus 09/14/2017  Constipation   DVT (deep venous thrombosis) (HCC)   Herniated disc   Hypertension   Pulmonary embolism (HCC)   Seasonal allergies   Spinal stenosis   Stomach ulcer   due to NSAID  Tachycardia   UTI (lower urinary tract infection)   Vitamin D deficiency 09/14/2017 Past Surgical History: Past Surgical History: Procedure Laterality Date  COLONOSCOPY  12/21/2011  Procedure: COLONOSCOPY;  Surgeon: [name], MD;  Location: [facility];  Service: Endoscopy;  Laterality: N/A;  DILATION AND CURETTAGE OF UTERUS    ORIF HUMERUS FRACTURE Left 09/12/2017  Procedure: OPEN REDUCTION INTERNAL FIXATION (ORIF) PROXIMAL  HUMERUS FRACTURE;  Surgeon: Myrene Galas, MD;  Location: [facility] OR;  Service: Orthopedics;  Laterality: Left;  TONSILLECTOMY AND ADENOIDECTOMY   Angela Nevin, MA, CCC-SLP Speech Therapy   ECHOCARDIOGRAM COMPLETE  Result Date: 03/13/2023    ECHOCARDIOGRAM REPORT    Patient Name:   Becky Leach Date of Exam: 03/13/2023 Medical Rec #:  [id number]     Height:       65.0 in Accession #:    [id number]    Weight:       101.0 lb Date of Birth:  December 05, 1929      BSA:          1.479 m Patient Age:    89 years      BP:           152/99 mmHg Patient Gender: F             HR:           94 bpm. Exam Location:  Inpatient Procedure: 2D Echo, Cardiac Doppler and Color Doppler Indications:     Pulmonary embolus  History:

## 2023-03-14 NOTE — Progress Notes (Signed)
Mobility Specialist - Progress Note  Pre-mobility: 100% SpO2 During mobility: 89% SpO2 Post-mobility: 99% SPO2   03/14/23 1432  Oxygen Therapy  O2 Device Room Air  Patient Activity (if Appropriate) Ambulating  Mobility  Activity Ambulated with assistance in hallway  Level of Assistance Standby assist, set-up cues, supervision of patient - no hands on  Assistive Device Front wheel walker  Distance Ambulated (ft) 160 ft  Range of Motion/Exercises Active  Activity Response Tolerated fair  Mobility Referral Yes  $Mobility charge 1 Mobility  Mobility Specialist Start Time (ACUTE ONLY) 1400  Mobility Specialist Stop Time (ACUTE ONLY) 1432  Mobility Specialist Time Calculation (min) (ACUTE ONLY) 32 min   Pt was found in bed and agreeable to ambulate. Pt had incontinent B going from STS. Assisted NT in pt pericare. Pt ambulated without complaints. Returned to bed with all needs met. Call bell in reach and RN notified. Bed alarm on.  Billey Chang Mobility Specialist

## 2023-03-14 NOTE — Plan of Care (Signed)
Problem: Elimination: Goal: Will not experience complications related to bowel motility Outcome: Adequate for Discharge Goal: Will not experience complications related to urinary retention Outcome: Adequate for Discharge   Problem: Pain Managment: Goal: General experience of comfort will improve Outcome: Adequate for Discharge   Problem: Safety: Goal: Ability to remain free from injury will improve Outcome: Adequate for Discharge   Problem: Skin Integrity: Goal: Risk for impaired skin integrity will decrease Outcome: Adequate for Discharge

## 2023-03-14 NOTE — TOC Progression Note (Signed)
Transition of Care [facility]) - Progression Note    Patient Details  Name: Becky Leach MRN: [id number] Date of Birth: [birth year]  [facility] CM/SW Contact  Johanna Matto, Olegario Messier, RN Phone Number: 03/14/2023, 12:27 PM  Clinical Narrative: [name](son) chose [facility] (semi private rm) bed available tomorrow if stable. MD notified.      Expected Discharge Plan: Skilled Nursing Facility Barriers to Discharge: Continued Medical Work up  Expected Discharge Plan and Services                                               Social Determinants of Health (SDOH) Interventions SDOH Screenings   Food Insecurity: No Food Insecurity (03/11/2023)  Housing: Low Risk  (03/11/2023)  Transportation Needs: No Transportation Needs (03/11/2023)  Utilities: Not At Risk (03/11/2023)  Depression (PHQ2-9): Medium Risk (03/19/2021)  Tobacco Use: Low Risk  (03/11/2023)    Readmission Risk Interventions     No data to display

## 2023-03-14 NOTE — Plan of Care (Signed)
Problem: Education: Goal: Knowledge of General Education information will improve Description: Including pain rating scale, medication(s)/side effects and non-pharmacologic comfort measures Outcome: Progressing   Problem: Clinical Measurements: Goal: Diagnostic test results will improve Outcome: Progressing   Problem: Activity: Goal: Risk for activity intolerance will decrease Outcome: Progressing

## 2023-03-14 NOTE — Progress Notes (Signed)
PHARMACY - ANTICOAGULATION CONSULT NOTE  Pharmacy Consult for heparin Indication: pulmonary embolus  Allergies  Allergen Reactions   Nsaids Other (See Comments)    "NSAIDS WILL CAUSE BLEEDING ULCERS"- do NOT give   Penicillins Rash and Other (See Comments)    Cephalosporin tolerant given during surgery 08/2017    Patient Measurements: Height: 5\' 5"  (165.1 cm) Weight: 45.8 kg (100 lb 15.5 oz) IBW/kg (Calculated) : 57 Heparin Dosing Weight: TBW  Vital Signs: Temp: 98.6 F (37 C) (10/15 0236) Temp Source: Oral (10/14 1929) BP: 135/91 (10/15 0236) Pulse Rate: 102 (10/15 0236)  Labs: Recent Labs    03/11/23 1420 03/11/23 1420 03/12/23 0458 03/12/23 1235 03/13/23 0438 03/14/23 0442  HGB 13.6  --  13.8  --  13.4 14.1  HCT 40.9  --  41.8  --  40.4 41.6  PLT 179  --  175  --  176 179  APTT 27  --   --   --   --   --   LABPROT 15.5*  --   --   --   --   --   INR 1.2  --   --   --   --   --   HEPARINUNFRC  --    < > 0.53 0.47 0.59 0.60  CREATININE 1.26*  --  0.98  --  1.03* 1.11*   < > = values in this interval not displayed.    Estimated Creatinine Clearance: 23.9 mL/min (A) (by C-G formula based on SCr of 1.11 mg/dL (H)).   Medical History: Past Medical History:  Diagnosis Date   AKI (acute kidney injury) (HCC)    due to NSAID   Arthritis    Closed fracture of left proximal humerus 09/14/2017   Constipation    DVT (deep venous thrombosis) (HCC)    Herniated disc    Hypertension    Pulmonary embolism (HCC)    Seasonal allergies    Spinal stenosis    Stomach ulcer    due to NSAID   Tachycardia    UTI (lower urinary tract infection)    Vitamin D deficiency 09/14/2017    Medications:  Scheduled:   amLODipine  2.5 mg Oral Daily   atorvastatin  40 mg Oral Daily   cholecalciferol  1,000 Units Oral Daily   feeding supplement  237 mL Oral BID BM   fesoterodine  4 mg Oral QHS   latanoprost  1 drop Both Eyes QHS   loratadine  10 mg Oral Daily   mirabegron ER   50 mg Oral Daily   pantoprazole  40 mg Oral Daily   sodium chloride flush  3 mL Intravenous Q12H    Assessment: 89 YO female presenting with shortness of breath, found to have a PE in her bilateral upper pulmonary arteries--no right heart strain. She is on Eliquis PTA for a history of PE but stopped taking it over a week ago (last dose 9/28) due to hematuria. Patient has reportedly not had hematuria since 10/1. Pharmacy consulted for heparin dosing.   Today, 03/14/23 Heparin level continues to be therapeutic on IV heparin 750 units/hr CBC stable No s/sx of bleeding or infusion related concerns reported  Goal of Therapy:  Heparin level 0.3-0.7 units/ml Monitor platelets by anticoagulation protocol: Yes   Plan:  Continue heparin gtt @ 750 units/hr  Monitor heparin level, CBC, and s/sx of bleeding daily F/u transition to PO anticoagulation   Arley Phenix RPh 03/14/2023, 5:19 AM

## 2023-03-14 NOTE — Progress Notes (Signed)
Mobility Specialist - Progress Note  Pre-mobility: 94 bpm HR, 100% SpO2 Post-mobility: 101 bpm HR, 93% SPO2   03/14/23 0924  Mobility  Activity Ambulated with assistance in hallway;Ambulated with assistance to bathroom  Level of Assistance Contact guard assist, steadying assist  Assistive Device Front wheel walker  Distance Ambulated (ft) 90 ft  Range of Motion/Exercises Active  Activity Response Tolerated well  Mobility Referral Yes  $Mobility charge 1 Mobility  Mobility Specialist Start Time (ACUTE ONLY) 0840  Mobility Specialist Stop Time (ACUTE ONLY) 0920  Mobility Specialist Time Calculation (min) (ACUTE ONLY) 40 min   Pt was found in bed and agreeable to ambulate. Grew fatigued with session. Returned to use bathroom. Pt was CG for STS in bathroom and SB for bed mobility and ambulation. Pt returned to recliner chair with all needs met. Call bell in reach and chair alarm on. Unable to take SPO2 ambulating due to pts hands bring cold and unable to get a good wave form.   Billey Chang Mobility Specialist

## 2023-03-14 NOTE — Progress Notes (Signed)
SATURATION QUALIFICATIONS: (This note is used to comply with regulatory documentation for home oxygen)  Patient Saturations on Room Air at Rest = 100%  Patient Saturations on Room Air while Ambulating = 89%  Patient Saturations on N/A Liters of oxygen while Ambulating = N/A  Please briefly explain why patient needs home oxygen: Pt will not require home oxygen.

## 2023-03-15 DIAGNOSIS — R32 Unspecified urinary incontinence: Secondary | ICD-10-CM | POA: Diagnosis not present

## 2023-03-15 DIAGNOSIS — M6250 Muscle wasting and atrophy, not elsewhere classified, unspecified site: Secondary | ICD-10-CM | POA: Diagnosis not present

## 2023-03-15 DIAGNOSIS — M6259 Muscle wasting and atrophy, not elsewhere classified, multiple sites: Secondary | ICD-10-CM | POA: Diagnosis not present

## 2023-03-15 DIAGNOSIS — Z8673 Personal history of transient ischemic attack (TIA), and cerebral infarction without residual deficits: Secondary | ICD-10-CM | POA: Diagnosis not present

## 2023-03-15 DIAGNOSIS — I82401 Acute embolism and thrombosis of unspecified deep veins of right lower extremity: Secondary | ICD-10-CM | POA: Diagnosis not present

## 2023-03-15 DIAGNOSIS — Z9181 History of falling: Secondary | ICD-10-CM | POA: Diagnosis not present

## 2023-03-15 DIAGNOSIS — R319 Hematuria, unspecified: Secondary | ICD-10-CM | POA: Diagnosis not present

## 2023-03-15 DIAGNOSIS — B964 Proteus (mirabilis) (morganii) as the cause of diseases classified elsewhere: Secondary | ICD-10-CM | POA: Diagnosis not present

## 2023-03-15 DIAGNOSIS — R531 Weakness: Secondary | ICD-10-CM | POA: Diagnosis not present

## 2023-03-15 DIAGNOSIS — N1832 Chronic kidney disease, stage 3b: Secondary | ICD-10-CM | POA: Diagnosis not present

## 2023-03-15 DIAGNOSIS — R310 Gross hematuria: Secondary | ICD-10-CM | POA: Diagnosis not present

## 2023-03-15 DIAGNOSIS — K5909 Other constipation: Secondary | ICD-10-CM | POA: Diagnosis not present

## 2023-03-15 DIAGNOSIS — M6281 Muscle weakness (generalized): Secondary | ICD-10-CM | POA: Diagnosis not present

## 2023-03-15 DIAGNOSIS — Z1331 Encounter for screening for depression: Secondary | ICD-10-CM | POA: Diagnosis not present

## 2023-03-15 DIAGNOSIS — H409 Unspecified glaucoma: Secondary | ICD-10-CM | POA: Diagnosis not present

## 2023-03-15 DIAGNOSIS — R1311 Dysphagia, oral phase: Secondary | ICD-10-CM | POA: Diagnosis not present

## 2023-03-15 DIAGNOSIS — I2699 Other pulmonary embolism without acute cor pulmonale: Secondary | ICD-10-CM | POA: Diagnosis not present

## 2023-03-15 DIAGNOSIS — Z9189 Other specified personal risk factors, not elsewhere classified: Secondary | ICD-10-CM | POA: Diagnosis not present

## 2023-03-15 DIAGNOSIS — E785 Hyperlipidemia, unspecified: Secondary | ICD-10-CM | POA: Diagnosis not present

## 2023-03-15 DIAGNOSIS — Z7901 Long term (current) use of anticoagulants: Secondary | ICD-10-CM | POA: Diagnosis not present

## 2023-03-15 DIAGNOSIS — R2681 Unsteadiness on feet: Secondary | ICD-10-CM | POA: Diagnosis not present

## 2023-03-15 DIAGNOSIS — R1314 Dysphagia, pharyngoesophageal phase: Secondary | ICD-10-CM | POA: Diagnosis not present

## 2023-03-15 DIAGNOSIS — J302 Other seasonal allergic rhinitis: Secondary | ICD-10-CM | POA: Diagnosis not present

## 2023-03-15 DIAGNOSIS — I129 Hypertensive chronic kidney disease with stage 1 through stage 4 chronic kidney disease, or unspecified chronic kidney disease: Secondary | ICD-10-CM | POA: Diagnosis not present

## 2023-03-15 DIAGNOSIS — R2689 Other abnormalities of gait and mobility: Secondary | ICD-10-CM | POA: Diagnosis not present

## 2023-03-15 DIAGNOSIS — J9600 Acute respiratory failure, unspecified whether with hypoxia or hypercapnia: Secondary | ICD-10-CM | POA: Diagnosis not present

## 2023-03-15 DIAGNOSIS — Z7189 Other specified counseling: Secondary | ICD-10-CM | POA: Diagnosis not present

## 2023-03-15 DIAGNOSIS — Z86711 Personal history of pulmonary embolism: Secondary | ICD-10-CM | POA: Diagnosis not present

## 2023-03-15 DIAGNOSIS — Z7401 Bed confinement status: Secondary | ICD-10-CM | POA: Diagnosis not present

## 2023-03-15 DIAGNOSIS — M4986 Spondylopathy in diseases classified elsewhere, lumbar region: Secondary | ICD-10-CM | POA: Diagnosis not present

## 2023-03-15 DIAGNOSIS — Z681 Body mass index (BMI) 19 or less, adult: Secondary | ICD-10-CM | POA: Diagnosis not present

## 2023-03-15 DIAGNOSIS — E872 Acidosis, unspecified: Secondary | ICD-10-CM | POA: Diagnosis not present

## 2023-03-15 DIAGNOSIS — N3000 Acute cystitis without hematuria: Secondary | ICD-10-CM | POA: Diagnosis not present

## 2023-03-15 DIAGNOSIS — J9601 Acute respiratory failure with hypoxia: Secondary | ICD-10-CM | POA: Diagnosis not present

## 2023-03-15 DIAGNOSIS — N390 Urinary tract infection, site not specified: Secondary | ICD-10-CM | POA: Diagnosis not present

## 2023-03-15 DIAGNOSIS — I824Y1 Acute embolism and thrombosis of unspecified deep veins of right proximal lower extremity: Secondary | ICD-10-CM | POA: Diagnosis not present

## 2023-03-15 DIAGNOSIS — M4800 Spinal stenosis, site unspecified: Secondary | ICD-10-CM | POA: Diagnosis not present

## 2023-03-15 DIAGNOSIS — M48061 Spinal stenosis, lumbar region without neurogenic claudication: Secondary | ICD-10-CM | POA: Diagnosis not present

## 2023-03-15 DIAGNOSIS — M159 Polyosteoarthritis, unspecified: Secondary | ICD-10-CM | POA: Diagnosis not present

## 2023-03-15 DIAGNOSIS — K219 Gastro-esophageal reflux disease without esophagitis: Secondary | ICD-10-CM | POA: Diagnosis not present

## 2023-03-15 DIAGNOSIS — R911 Solitary pulmonary nodule: Secondary | ICD-10-CM | POA: Diagnosis not present

## 2023-03-15 LAB — BASIC METABOLIC PANEL
Anion gap: 9 (ref 5–15)
BUN: 22 mg/dL (ref 8–23)
CO2: 24 mmol/L (ref 22–32)
Calcium: 9.1 mg/dL (ref 8.9–10.3)
Chloride: 107 mmol/L (ref 98–111)
Creatinine, Ser: 1.3 mg/dL — ABNORMAL HIGH (ref 0.44–1.00)
GFR, Estimated: 39 mL/min — ABNORMAL LOW (ref >60)
Glucose, Bld: 90 mg/dL (ref 70–99)
Potassium: 4.2 mmol/L (ref 3.5–5.1)
Sodium: 140 mmol/L (ref 135–145)

## 2023-03-15 LAB — CBC
HCT: 43.1 % (ref 36.0–46.0)
Hemoglobin: 14.3 g/dL (ref 12.0–15.0)
MCH: 29.3 pg (ref 26.0–34.0)
MCHC: 33.2 g/dL (ref 30.0–36.0)
MCV: 88.3 fL (ref 80.0–100.0)
Platelets: 197 10*3/uL (ref 150–400)
RBC: 4.88 MIL/uL (ref 3.87–5.11)
RDW: 14.2 % (ref 11.5–15.5)
WBC: 5.4 10*3/uL (ref 4.0–10.5)
nRBC: 0 % (ref 0.0–0.2)

## 2023-03-15 MED ORDER — SENNOSIDES-DOCUSATE SODIUM 8.6-50 MG PO TABS: 1.0000 | ORAL_TABLET | Freq: Every evening | ORAL | 0 refills | Status: DC | PRN

## 2023-03-15 MED ORDER — ENSURE ENLIVE PO LIQD: 237.0000 mL | Freq: Two times a day (BID) | ORAL | 12 refills | Status: DC

## 2023-03-15 MED ORDER — ONDANSETRON HCL 4 MG PO TABS: 4.0000 mg | ORAL_TABLET | Freq: Four times a day (QID) | ORAL | 0 refills | Status: DC | PRN

## 2023-03-15 MED ORDER — AMLODIPINE BESYLATE 2.5 MG PO TABS: 2.5000 mg | ORAL_TABLET | Freq: Every day | ORAL | 0 refills | Status: DC

## 2023-03-15 MED ORDER — CEFADROXIL 500 MG PO CAPS: 500.0000 mg | ORAL_CAPSULE | Freq: Two times a day (BID) | ORAL | 0 refills | Status: AC

## 2023-03-15 MED ORDER — ACETAMINOPHEN 325 MG PO TABS: 650.0000 mg | ORAL_TABLET | Freq: Four times a day (QID) | ORAL | 0 refills | Status: DC | PRN

## 2023-03-15 MED ORDER — APIXABAN 5 MG PO TABS: ORAL_TABLET | ORAL | Status: AC

## 2023-03-15 NOTE — TOC Transition Note (Addendum)
Transition of Care [facility]) - CM/SW Discharge Note   Patient Details  Name: Becky Leach MRN: [id number] Date of Birth: [birth year]  Transition [facility] CM/SW Contact:  Lanier Clam, RN Phone Number: 03/15/2023, 1:07 PM   Clinical Narrative: d/c [facility] rep Lawerance Cruel aware. D/c summary sent await rm,tel# report prior PTAR.   -going to [facility] rm#[id number],report tel#[phone]. PTAR called. No further CM needs.    Final next level of care: Skilled Nursing Facility Barriers to Discharge: No Barriers Identified   Patient Goals and CMS Choice      Discharge Placement                         Discharge Plan and Services Additional resources added to the After Visit Summary for                                       Social Determinants of Health (SDOH) Interventions SDOH Screenings   Food Insecurity: No Food Insecurity (03/11/2023)  Housing: Low Risk  (03/11/2023)  Transportation Needs: No Transportation Needs (03/11/2023)  Utilities: Not At Risk (03/11/2023)  Depression (PHQ2-9): Medium Risk (03/19/2021)  Tobacco Use: Low Risk  (03/11/2023)     Readmission Risk Interventions     No data to display

## 2023-03-15 NOTE — Hospital Course (Addendum)
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  Patient noted to have stopped Eliquis on 02/25/2023 and has not had any gross hematuria since 02/28/2023 however became more short of breath prompting her to be evaluated in the ED.   Patient also complaining of a nonproductive cough.  Patient denies any change in chronic leg swelling.  Patient also endorsed dysuria.  Urinalysis concerning for UTI.  Patient seen in the ED EKG with a sinus tachycardia with PACs, chest x-ray negative for any acute findings, CT angiogram chest done positive for bilateral upper PE.    She was admitted, placed on IV heparin, 2D echo with no right ventricular strain, lower extremity Dopplers with right lower extremity DVT.  Patient on IV heparin transition to Eliquis IV antibiotics transition to oral antibiotics.  She is improved and medically stable to D/C to SNF and will need to follow up with PCP within 1-2 weeks, Urology, and Pulmonary in the outpatient setting.  Assessment and Plan:  Acute Respiratory failure with hypoxia secondary to acute bilateral PE/extensive right lower extremity DVT -Patient presenting with acute shortness of breath after discontinuation of Eliquis 02/25/2023 due to hematuria. SpO2: 96 % O2 Flow Rate (L/min): 2 L/min; Now off of Supplemental O2 -Patient noted with no further hematuria as of 02/28/2023. -Chest x-ray done negative for any acute abnormalities. -CT angiogram chest done with bilateral upper pulmonary artery PE without right heart strain. -Patient with clinical improvement after being started on IV heparin. -Lower extremity Dopplers ordered with RLE DVT in distal EIV, CFV, SFJ, FV, PFV, popliteal vein. -2D echo with EF of 60 to 65%, grade 1 diastolic dysfunction,NWMA, normal  right ventricle with low normal right ventricular systolic function, no increasing right ventricular wall thickness. -Patient with no bleeding, no gross hematuria noted. -Transitioned from IV heparin back to Apixaban. -Will need Outpatient follow-up with primary Pulmonologist, Dr. Isaiah Serge and will also need to be seen by Hematology/Oncology in the outpatient setting -Ambulatory Home O2 Screen done and patient did not desaturate   Proteus Mirabilis UTI -Patient presented with urinary symptoms of dysuria which has improved per patient.. -Urinalysis concerning for UTI as it showed a hazy appearance with large leukocytes, negative nitrites, rare bacteria, 11-20 RBCs per high-power field, 0-5 squamous epithelial cells and greater than 50 WBCs -Urine cultures consistent with >100,000 colonies of Proteus Mirabilis, -Sensitivities from urine culture resistant to Macrobid otherwise pansensitive.   -Transitioned from IV Rocephin to Duricef to complete a course of antibiotic treatment.    CKD Stage IIIb -BUN/Cr Trend: Recent Labs  Lab 03/11/23 1420 03/12/23 0458 03/13/23 0438 03/14/23 0442 03/15/23 0453  BUN 21 13 15 16 22   CREATININE 1.26* 0.98 1.03* 1.11* 1.30*  -Avoid Nephrotoxic Medications, Contrast Dyes, Hypotension and Dehydration to Ensure Adequate Renal Perfusion and will need to Renally Adjust Meds -Continue to Monitor and Trend Renal Function carefully and repeat CMP within 1 week   Gross Hematuria, resolved -No further gross hematuria after holding Eliquis and resolved as of 02/28/23. -On IV heparin with no hematuria noted and transitioned back to Apixaban.  -Will need to reschedule outpatient follow-up appointment with urology.   Lung Nodule -Incidentally noted on CT scan. -CT mentioned "Subpleural irregular subsolid nodule in the posterior right upper lobe measuring 2.9 x 2.1 cm. Follow-up non-contrast CT  recommended  t 3-6 months to confirm persistence. If unchanged, and solid  component remains <6 mm, annual CT is recommended until 5 years of stability has been established. If persistent these nodules should be considered highly suspicious if the solid component of the nodule is 6 mm or greater in size and enlarging."  -Outpatient follow-up with pulmonary and will need outpatient imaging.   Hypoalbuminemia -Patient's Albumin Trend: Recent Labs  Lab 03/11/23 1420  ALBUMIN 3.2*  -Continue to Monitor and Trend and repeat CMP in the AM

## 2023-03-15 NOTE — Plan of Care (Signed)
  Problem: Education: Goal: Knowledge of General Education information will improve Description: Including pain rating scale, medication(s)/side effects and non-pharmacologic comfort measures Outcome: Completed/Met   Problem: Health Behavior/Discharge Planning: Goal: Ability to manage health-related needs will improve Outcome: Completed/Met   Problem: Clinical Measurements: Goal: Ability to maintain clinical measurements within normal limits will improve Outcome: Completed/Met Goal: Will remain free from infection Outcome: Completed/Met Goal: Diagnostic test results will improve Outcome: Completed/Met Goal: Respiratory complications will improve Outcome: Completed/Met Goal: Cardiovascular complication will be avoided Outcome: Completed/Met   Problem: Activity: Goal: Risk for activity intolerance will decrease Outcome: Completed/Met   Problem: Nutrition: Goal: Adequate nutrition will be maintained Outcome: Completed/Met   Problem: Coping: Goal: Level of anxiety will decrease Outcome: Completed/Met   Problem: Elimination: Goal: Will not experience complications related to bowel motility Outcome: Completed/Met Goal: Will not experience complications related to urinary retention Outcome: Completed/Met   Problem: Pain Managment: Goal: General experience of comfort will improve Outcome: Completed/Met   Problem: Safety: Goal: Ability to remain free from injury will improve Outcome: Completed/Met   Problem: Skin Integrity: Goal: Risk for impaired skin integrity will decrease Outcome: Completed/Met   Problem: Acute Rehab PT Goals(only PT should resolve) Goal: Pt Will Go Supine/Side To Sit Outcome: Completed/Met Goal: Patient Will Transfer Sit To/From Stand Outcome: Completed/Met Goal: Pt Will Ambulate Outcome: Completed/Met Goal: Pt/caregiver will Perform Home Exercise Program Outcome: Completed/Met   Problem: SLP Dysphagia Goals Goal: Misc Dysphagia  Goal Outcome: Completed/Met   Problem: Acute Rehab OT Goals (only OT should resolve) Goal: Pt. Will Perform Lower Body Dressing Outcome: Completed/Met Goal: Pt. Will Transfer To Toilet Outcome: Completed/Met Goal: Pt. Will Perform Toileting-Clothing Manipulation Outcome: Completed/Met

## 2023-03-15 NOTE — Discharge Summary (Signed)
Baseline vocal quality/speech: Normal No data recorded Volitional Swallow: Able to elicit Exam Limitations: No limitations Goal Planning: Prognosis for improved oropharyngeal function: Good No data recorded No data recorded No data recorded Consulted and agree with results and recommendations: Patient Pain: Pain Assessment Pain Assessment: No/denies pain  End of Session: Start Time:SLP Start Time (ACUTE ONLY): 0900 Stop Time: SLP Stop Time (ACUTE ONLY): 0930 Time Calculation:SLP Time Calculation (min) (ACUTE ONLY): 30 min Charges: SLP Evaluations $ SLP Speech Visit: 1 Visit SLP Evaluations $BSS Swallow: 1 Procedure $MBS Swallow: 1 Procedure SLP visit diagnosis: SLP Visit Diagnosis: Dysphagia, unspecified (R13.10) Past Medical History: Past Medical History: Diagnosis Date  AKI (acute kidney injury) (HCC)   due to NSAID  Arthritis   Closed fracture of left proximal humerus 09/14/2017  Constipation   DVT (deep venous thrombosis) (HCC)   Herniated disc   Hypertension   Pulmonary embolism (HCC)   Seasonal allergies   Spinal stenosis   Stomach ulcer   due to NSAID  Tachycardia   UTI (lower urinary tract infection)   Vitamin D deficiency 09/14/2017 Past Surgical History: Past Surgical History: Procedure Laterality Date  COLONOSCOPY  12/21/2011  Procedure: COLONOSCOPY;  Surgeon: [name], MD;  Location: [facility];  Service: Endoscopy;  Laterality: N/A;  DILATION AND CURETTAGE OF UTERUS    ORIF HUMERUS FRACTURE Left 09/12/2017  Procedure: OPEN REDUCTION INTERNAL FIXATION (ORIF) PROXIMAL  HUMERUS FRACTURE;  Surgeon: Myrene Galas, MD;  Location: [facility];  Service: Orthopedics;  Laterality: Left;  TONSILLECTOMY AND ADENOIDECTOMY   Angela Nevin, MA, CCC-SLP Speech Therapy   ECHOCARDIOGRAM COMPLETE  Result Date: 03/13/2023    ECHOCARDIOGRAM REPORT   Patient Name:   Becky Leach Date of Exam: 03/13/2023 Medical Rec #:  [id number]     Height:       65.0 in Accession #:    [id number]    Weight:       101.0 lb Date of Birth:  November 21, 1929      BSA:          1.479 m Patient Age:    89 years      BP:           152/99 mmHg Patient Gender: F             HR:           94 bpm. Exam Location:  Inpatient Procedure: 2D Echo, Cardiac Doppler and Color Doppler Indications:     Pulmonary embolus  History:         Patient has prior history of Echocardiogram examinations.  Sonographer:      Darlys Gales Referring Phys:  [id number] DANIEL V THOMPSON Diagnosing Phys: Wilfred Lacy IMPRESSIONS  1. Left ventricular ejection fraction, by estimation, is 60 to 65%. The left ventricle has normal function. The left ventricle has no regional wall motion abnormalities. Left ventricular diastolic parameters are consistent with Grade I diastolic dysfunction (impaired relaxation).  2. Peak RV-RA gradient 27 mmHg. The IVC was not visualized. Right ventricular systolic function is low normal. The right ventricular size is normal.  3. The mitral valve is normal in structure. No evidence of mitral valve regurgitation. No evidence of mitral stenosis.  4. The aortic valve is tricuspid. There is mild calcification of the aortic valve. Aortic valve regurgitation is mild. No aortic stenosis is present. FINDINGS  Left Ventricle: Left ventricular ejection fraction, by estimation, is 60 to 65%. The left ventricle has normal function. The left ventricle has  Baseline vocal quality/speech: Normal No data recorded Volitional Swallow: Able to elicit Exam Limitations: No limitations Goal Planning: Prognosis for improved oropharyngeal function: Good No data recorded No data recorded No data recorded Consulted and agree with results and recommendations: Patient Pain: Pain Assessment Pain Assessment: No/denies pain  End of Session: Start Time:SLP Start Time (ACUTE ONLY): 0900 Stop Time: SLP Stop Time (ACUTE ONLY): 0930 Time Calculation:SLP Time Calculation (min) (ACUTE ONLY): 30 min Charges: SLP Evaluations $ SLP Speech Visit: 1 Visit SLP Evaluations $BSS Swallow: 1 Procedure $MBS Swallow: 1 Procedure SLP visit diagnosis: SLP Visit Diagnosis: Dysphagia, unspecified (R13.10) Past Medical History: Past Medical History: Diagnosis Date  AKI (acute kidney injury) (HCC)   due to NSAID  Arthritis   Closed fracture of left proximal humerus 09/14/2017  Constipation   DVT (deep venous thrombosis) (HCC)   Herniated disc   Hypertension   Pulmonary embolism (HCC)   Seasonal allergies   Spinal stenosis   Stomach ulcer   due to NSAID  Tachycardia   UTI (lower urinary tract infection)   Vitamin D deficiency 09/14/2017 Past Surgical History: Past Surgical History: Procedure Laterality Date  COLONOSCOPY  12/21/2011  Procedure: COLONOSCOPY;  Surgeon: [name], MD;  Location: [facility];  Service: Endoscopy;  Laterality: N/A;  DILATION AND CURETTAGE OF UTERUS    ORIF HUMERUS FRACTURE Left 09/12/2017  Procedure: OPEN REDUCTION INTERNAL FIXATION (ORIF) PROXIMAL  HUMERUS FRACTURE;  Surgeon: Myrene Galas, MD;  Location: [facility];  Service: Orthopedics;  Laterality: Left;  TONSILLECTOMY AND ADENOIDECTOMY   Angela Nevin, MA, CCC-SLP Speech Therapy   ECHOCARDIOGRAM COMPLETE  Result Date: 03/13/2023    ECHOCARDIOGRAM REPORT   Patient Name:   Becky Leach Date of Exam: 03/13/2023 Medical Rec #:  [id number]     Height:       65.0 in Accession #:    [id number]    Weight:       101.0 lb Date of Birth:  November 21, 1929      BSA:          1.479 m Patient Age:    89 years      BP:           152/99 mmHg Patient Gender: F             HR:           94 bpm. Exam Location:  Inpatient Procedure: 2D Echo, Cardiac Doppler and Color Doppler Indications:     Pulmonary embolus  History:         Patient has prior history of Echocardiogram examinations.  Sonographer:      Darlys Gales Referring Phys:  [id number] DANIEL V THOMPSON Diagnosing Phys: Wilfred Lacy IMPRESSIONS  1. Left ventricular ejection fraction, by estimation, is 60 to 65%. The left ventricle has normal function. The left ventricle has no regional wall motion abnormalities. Left ventricular diastolic parameters are consistent with Grade I diastolic dysfunction (impaired relaxation).  2. Peak RV-RA gradient 27 mmHg. The IVC was not visualized. Right ventricular systolic function is low normal. The right ventricular size is normal.  3. The mitral valve is normal in structure. No evidence of mitral valve regurgitation. No evidence of mitral stenosis.  4. The aortic valve is tricuspid. There is mild calcification of the aortic valve. Aortic valve regurgitation is mild. No aortic stenosis is present. FINDINGS  Left Ventricle: Left ventricular ejection fraction, by estimation, is 60 to 65%. The left ventricle has normal function. The left ventricle has  Physician Discharge Summary   Patient: Becky Leach MRN: [id number] DOB: 1929-11-11  Admit date:     03/11/2023  Discharge date: 03/15/23  Discharge Physician: Marguerita Merles, DO   PCP: Erick Alley, DO   Recommendations at discharge:   Follow up with PCP within 1-2 weeks and repeat CBC,CMP,Mag,Phos within 1 week Follow up with Urology in the outpatient setting within 1-2 weeks Follow up with Pulmonary in the outpatient setting  Follow up with Hematology/Oncology for Hypercoagulable workup Follow up on Lung Nodule noted with repeat CT Scan in 3-6 months and further care per Pulmonary   Discharge Diagnoses: Principal Problem:   Acute respiratory failure with hypoxia (HCC) Active Problems:   History of pulmonary embolus (PE)   CKD (chronic kidney disease) stage 3, GFR 30-59 ml/min (HCC)   Hematuria   Lactic acidosis   Acute cystitis   Acute pulmonary embolism (HCC)   Lung nodule   Acute deep vein thrombosis (DVT) of proximal vein of right lower extremity (HCC)  Resolved Problems:   * No resolved hospital problems. [facility] Course: The patient is a pleasant 89 year old female history of spinal stenosis, CKD 3B, unprovoked PE in 2018 was on chronic anticoagulation, recent hematuria for which patient has been off Eliquis for approximately 2 weeks presenting to the ED with shortness of breath.  Patient noted to have stopped Eliquis on 02/25/2023 and has not had any gross hematuria since 02/28/2023 however became more short of breath prompting her to be evaluated in the ED.   Patient also complaining of a nonproductive cough.  Patient denies any change in chronic leg swelling.  Patient also endorsed dysuria.  Urinalysis concerning for UTI.  Patient seen in the ED EKG with a sinus tachycardia with PACs, chest x-ray negative for any acute findings, CT angiogram chest done positive for bilateral upper PE.    She was admitted, placed on IV heparin, 2D echo with no right ventricular strain,  lower extremity Dopplers with right lower extremity DVT.  Patient on IV heparin transition to Eliquis IV antibiotics transition to oral antibiotics.  She is improved and medically stable to D/C to SNF and will need to follow up with PCP within 1-2 weeks, Urology, and Pulmonary in the outpatient setting.  Assessment and Plan:  Acute Respiratory failure with hypoxia secondary to acute bilateral PE/extensive right lower extremity DVT -Patient presenting with acute shortness of breath after discontinuation of Eliquis 02/25/2023 due to hematuria. SpO2: 96 % O2 Flow Rate (L/min): 2 L/min; Now off of Supplemental O2 -Patient noted with no further hematuria as of 02/28/2023. -Chest x-ray done negative for any acute abnormalities. -CT angiogram chest done with bilateral upper pulmonary artery PE without right heart strain. -Patient with clinical improvement after being started on IV heparin. -Lower extremity Dopplers ordered with RLE DVT in distal EIV, CFV, SFJ, FV, PFV, popliteal vein. -2D echo with EF of 60 to 65%, grade 1 diastolic dysfunction,NWMA, normal right ventricle with low normal right ventricular systolic function, no increasing right ventricular wall thickness. -Patient with no bleeding, no gross hematuria noted. -Transitioned from IV heparin back to Apixaban. -Will need Outpatient follow-up with primary Pulmonologist, Dr. Isaiah Serge and will also need to be seen by Hematology/Oncology in the outpatient setting -Ambulatory Home O2 Screen done and patient did not desaturate   Proteus Mirabilis UTI -Patient presented with urinary symptoms of dysuria which has improved per patient.. -Urinalysis concerning for UTI as it showed a hazy appearance with large leukocytes, negative nitrites, rare  Physician Discharge Summary   Patient: Becky Leach MRN: [id number] DOB: 1929-11-11  Admit date:     03/11/2023  Discharge date: 03/15/23  Discharge Physician: Marguerita Merles, DO   PCP: Erick Alley, DO   Recommendations at discharge:   Follow up with PCP within 1-2 weeks and repeat CBC,CMP,Mag,Phos within 1 week Follow up with Urology in the outpatient setting within 1-2 weeks Follow up with Pulmonary in the outpatient setting  Follow up with Hematology/Oncology for Hypercoagulable workup Follow up on Lung Nodule noted with repeat CT Scan in 3-6 months and further care per Pulmonary   Discharge Diagnoses: Principal Problem:   Acute respiratory failure with hypoxia (HCC) Active Problems:   History of pulmonary embolus (PE)   CKD (chronic kidney disease) stage 3, GFR 30-59 ml/min (HCC)   Hematuria   Lactic acidosis   Acute cystitis   Acute pulmonary embolism (HCC)   Lung nodule   Acute deep vein thrombosis (DVT) of proximal vein of right lower extremity (HCC)  Resolved Problems:   * No resolved hospital problems. [facility] Course: The patient is a pleasant 89 year old female history of spinal stenosis, CKD 3B, unprovoked PE in 2018 was on chronic anticoagulation, recent hematuria for which patient has been off Eliquis for approximately 2 weeks presenting to the ED with shortness of breath.  Patient noted to have stopped Eliquis on 02/25/2023 and has not had any gross hematuria since 02/28/2023 however became more short of breath prompting her to be evaluated in the ED.   Patient also complaining of a nonproductive cough.  Patient denies any change in chronic leg swelling.  Patient also endorsed dysuria.  Urinalysis concerning for UTI.  Patient seen in the ED EKG with a sinus tachycardia with PACs, chest x-ray negative for any acute findings, CT angiogram chest done positive for bilateral upper PE.    She was admitted, placed on IV heparin, 2D echo with no right ventricular strain,  lower extremity Dopplers with right lower extremity DVT.  Patient on IV heparin transition to Eliquis IV antibiotics transition to oral antibiotics.  She is improved and medically stable to D/C to SNF and will need to follow up with PCP within 1-2 weeks, Urology, and Pulmonary in the outpatient setting.  Assessment and Plan:  Acute Respiratory failure with hypoxia secondary to acute bilateral PE/extensive right lower extremity DVT -Patient presenting with acute shortness of breath after discontinuation of Eliquis 02/25/2023 due to hematuria. SpO2: 96 % O2 Flow Rate (L/min): 2 L/min; Now off of Supplemental O2 -Patient noted with no further hematuria as of 02/28/2023. -Chest x-ray done negative for any acute abnormalities. -CT angiogram chest done with bilateral upper pulmonary artery PE without right heart strain. -Patient with clinical improvement after being started on IV heparin. -Lower extremity Dopplers ordered with RLE DVT in distal EIV, CFV, SFJ, FV, PFV, popliteal vein. -2D echo with EF of 60 to 65%, grade 1 diastolic dysfunction,NWMA, normal right ventricle with low normal right ventricular systolic function, no increasing right ventricular wall thickness. -Patient with no bleeding, no gross hematuria noted. -Transitioned from IV heparin back to Apixaban. -Will need Outpatient follow-up with primary Pulmonologist, Dr. Isaiah Serge and will also need to be seen by Hematology/Oncology in the outpatient setting -Ambulatory Home O2 Screen done and patient did not desaturate   Proteus Mirabilis UTI -Patient presented with urinary symptoms of dysuria which has improved per patient.. -Urinalysis concerning for UTI as it showed a hazy appearance with large leukocytes, negative nitrites, rare  omeprazole 40 MG capsule Commonly known as: PRILOSEC TAKE 1 CAPSULE (40 MG TOTAL) BY MOUTH DAILY.   ondansetron 4 MG tablet Commonly known as: ZOFRAN Take 1 tablet (4 mg total) by mouth every 6 (six) hours as needed for nausea.   senna-docusate 8.6-50 MG tablet Commonly known as: Senokot-S Take 1 tablet by mouth at bedtime as needed for mild constipation.   trospium 20 MG tablet Commonly known as: SANCTURA Take 20 mg by mouth at bedtime.   Vitamin D-3 25 MCG (1000 UT) Caps Take 1,000 Units by mouth daily.        Contact information for follow-up  providers     Mannam, Colbert Coyer, MD. Schedule an appointment as soon as possible for a visit in 4 week(s).   Specialty: Pulmonary Disease Contact information: [address] [place] [address] [phone]              Contact information for after-discharge care     Destination     [facility], [facility] Preferred SNF .   Service: Skilled Nursing Contact information: [address] [place] [address] [phone]                    Discharge Exam: Ceasar Mons Weights   03/11/23 2229  Weight: 45.8 kg   Vitals:   03/15/23 0445 03/15/23 1123  BP: (!) 149/105   Pulse: 87   Resp: 17   Temp: 97.6 F (36.4 C)   SpO2: 96% 97%   Examination: Physical Exam:  Constitutional: Thin elderly AAF in NAD Respiratory: Diminished to auscultation bilaterally with coarse breath sounds, no wheezing, rales, rhonchi or crackles. Normal respiratory effort and patient is not tachypenic. No accessory muscle use. Unlabored breathing and not wearing supplemental O2 via [place]  Cardiovascular: RRR, no murmurs / rubs / gallops. S1 and S2 auscultated. No extremity edema.  Abdomen: Soft, non-tender, non-distended. Bowel sounds positive.  GU: Deferred. Musculoskeletal: No clubbing / cyanosis of digits/nails. No joint deformity upper and lower extremities. Skin: No rashes, lesions, ulcers on a limited skin evaluation. No induration; Warm and dry.  Neurologic: CN 2-12 grossly intact with no focal deficits. Romberg sign and cerebellar reflexes not assessed.  Psychiatric: Normal judgment and insight. Alert and oriented x 3. Normal mood and appropriate affect.   Condition at discharge: stable  The results of significant diagnostics from this hospitalization (including imaging, microbiology, ancillary and laboratory) are listed below for reference.   Imaging Studies: DG Swallowing Func-Speech Pathology  Result Date: 03/13/2023 Table formatting from  the original result was not included. Modified Barium Swallow Study Patient Details Name: Becky Leach MRN: [id number] Date of Birth: 10-29-1929 Today's Date: 03/13/2023 HPI/PMH: HPI: Patient is a 89 y.o. female who presented on 03/12/23 with shortness of breath. Patient noted to have stopped Eliquis on 02/25/23 after recent hematuria. Urinalysis concerning for UTI.  Patient seen in the ED EKG with a sinus tachycardia with PACs, chest x-ray negative for any acute findings, CT angiogram chest done positive for bilateral upper PE. PMH significant for spinal stenosis, CKD 3b, unprovoked PE. MBS completed in 2022 with reccommendation onf Dys 3, nectar thick liquids. Clinical Impression: Clinical Impression: Patient seen by SLP for modified barium swallow study per hx of dysphagia. Patient presents with an overall functional oropharyngeal swallow. No aspiration or significant residuals were present at the time of this evaluation. One instance of penetration to the vocal cords (PAS 5) occured during swallow of nectar thick  Baseline vocal quality/speech: Normal No data recorded Volitional Swallow: Able to elicit Exam Limitations: No limitations Goal Planning: Prognosis for improved oropharyngeal function: Good No data recorded No data recorded No data recorded Consulted and agree with results and recommendations: Patient Pain: Pain Assessment Pain Assessment: No/denies pain  End of Session: Start Time:SLP Start Time (ACUTE ONLY): 0900 Stop Time: SLP Stop Time (ACUTE ONLY): 0930 Time Calculation:SLP Time Calculation (min) (ACUTE ONLY): 30 min Charges: SLP Evaluations $ SLP Speech Visit: 1 Visit SLP Evaluations $BSS Swallow: 1 Procedure $MBS Swallow: 1 Procedure SLP visit diagnosis: SLP Visit Diagnosis: Dysphagia, unspecified (R13.10) Past Medical History: Past Medical History: Diagnosis Date  AKI (acute kidney injury) (HCC)   due to NSAID  Arthritis   Closed fracture of left proximal humerus 09/14/2017  Constipation   DVT (deep venous thrombosis) (HCC)   Herniated disc   Hypertension   Pulmonary embolism (HCC)   Seasonal allergies   Spinal stenosis   Stomach ulcer   due to NSAID  Tachycardia   UTI (lower urinary tract infection)   Vitamin D deficiency 09/14/2017 Past Surgical History: Past Surgical History: Procedure Laterality Date  COLONOSCOPY  12/21/2011  Procedure: COLONOSCOPY;  Surgeon: [name], MD;  Location: [facility];  Service: Endoscopy;  Laterality: N/A;  DILATION AND CURETTAGE OF UTERUS    ORIF HUMERUS FRACTURE Left 09/12/2017  Procedure: OPEN REDUCTION INTERNAL FIXATION (ORIF) PROXIMAL  HUMERUS FRACTURE;  Surgeon: Myrene Galas, MD;  Location: [facility];  Service: Orthopedics;  Laterality: Left;  TONSILLECTOMY AND ADENOIDECTOMY   Angela Nevin, MA, CCC-SLP Speech Therapy   ECHOCARDIOGRAM COMPLETE  Result Date: 03/13/2023    ECHOCARDIOGRAM REPORT   Patient Name:   Becky Leach Date of Exam: 03/13/2023 Medical Rec #:  [id number]     Height:       65.0 in Accession #:    [id number]    Weight:       101.0 lb Date of Birth:  November 21, 1929      BSA:          1.479 m Patient Age:    89 years      BP:           152/99 mmHg Patient Gender: F             HR:           94 bpm. Exam Location:  Inpatient Procedure: 2D Echo, Cardiac Doppler and Color Doppler Indications:     Pulmonary embolus  History:         Patient has prior history of Echocardiogram examinations.  Sonographer:      Darlys Gales Referring Phys:  [id number] DANIEL V THOMPSON Diagnosing Phys: Wilfred Lacy IMPRESSIONS  1. Left ventricular ejection fraction, by estimation, is 60 to 65%. The left ventricle has normal function. The left ventricle has no regional wall motion abnormalities. Left ventricular diastolic parameters are consistent with Grade I diastolic dysfunction (impaired relaxation).  2. Peak RV-RA gradient 27 mmHg. The IVC was not visualized. Right ventricular systolic function is low normal. The right ventricular size is normal.  3. The mitral valve is normal in structure. No evidence of mitral valve regurgitation. No evidence of mitral stenosis.  4. The aortic valve is tricuspid. There is mild calcification of the aortic valve. Aortic valve regurgitation is mild. No aortic stenosis is present. FINDINGS  Left Ventricle: Left ventricular ejection fraction, by estimation, is 60 to 65%. The left ventricle has normal function. The left ventricle has  Baseline vocal quality/speech: Normal No data recorded Volitional Swallow: Able to elicit Exam Limitations: No limitations Goal Planning: Prognosis for improved oropharyngeal function: Good No data recorded No data recorded No data recorded Consulted and agree with results and recommendations: Patient Pain: Pain Assessment Pain Assessment: No/denies pain  End of Session: Start Time:SLP Start Time (ACUTE ONLY): 0900 Stop Time: SLP Stop Time (ACUTE ONLY): 0930 Time Calculation:SLP Time Calculation (min) (ACUTE ONLY): 30 min Charges: SLP Evaluations $ SLP Speech Visit: 1 Visit SLP Evaluations $BSS Swallow: 1 Procedure $MBS Swallow: 1 Procedure SLP visit diagnosis: SLP Visit Diagnosis: Dysphagia, unspecified (R13.10) Past Medical History: Past Medical History: Diagnosis Date  AKI (acute kidney injury) (HCC)   due to NSAID  Arthritis   Closed fracture of left proximal humerus 09/14/2017  Constipation   DVT (deep venous thrombosis) (HCC)   Herniated disc   Hypertension   Pulmonary embolism (HCC)   Seasonal allergies   Spinal stenosis   Stomach ulcer   due to NSAID  Tachycardia   UTI (lower urinary tract infection)   Vitamin D deficiency 09/14/2017 Past Surgical History: Past Surgical History: Procedure Laterality Date  COLONOSCOPY  12/21/2011  Procedure: COLONOSCOPY;  Surgeon: [name], MD;  Location: [facility];  Service: Endoscopy;  Laterality: N/A;  DILATION AND CURETTAGE OF UTERUS    ORIF HUMERUS FRACTURE Left 09/12/2017  Procedure: OPEN REDUCTION INTERNAL FIXATION (ORIF) PROXIMAL  HUMERUS FRACTURE;  Surgeon: Myrene Galas, MD;  Location: [facility];  Service: Orthopedics;  Laterality: Left;  TONSILLECTOMY AND ADENOIDECTOMY   Angela Nevin, MA, CCC-SLP Speech Therapy   ECHOCARDIOGRAM COMPLETE  Result Date: 03/13/2023    ECHOCARDIOGRAM REPORT   Patient Name:   Becky Leach Date of Exam: 03/13/2023 Medical Rec #:  [id number]     Height:       65.0 in Accession #:    [id number]    Weight:       101.0 lb Date of Birth:  November 21, 1929      BSA:          1.479 m Patient Age:    89 years      BP:           152/99 mmHg Patient Gender: F             HR:           94 bpm. Exam Location:  Inpatient Procedure: 2D Echo, Cardiac Doppler and Color Doppler Indications:     Pulmonary embolus  History:         Patient has prior history of Echocardiogram examinations.  Sonographer:      Darlys Gales Referring Phys:  [id number] DANIEL V THOMPSON Diagnosing Phys: Wilfred Lacy IMPRESSIONS  1. Left ventricular ejection fraction, by estimation, is 60 to 65%. The left ventricle has normal function. The left ventricle has no regional wall motion abnormalities. Left ventricular diastolic parameters are consistent with Grade I diastolic dysfunction (impaired relaxation).  2. Peak RV-RA gradient 27 mmHg. The IVC was not visualized. Right ventricular systolic function is low normal. The right ventricular size is normal.  3. The mitral valve is normal in structure. No evidence of mitral valve regurgitation. No evidence of mitral stenosis.  4. The aortic valve is tricuspid. There is mild calcification of the aortic valve. Aortic valve regurgitation is mild. No aortic stenosis is present. FINDINGS  Left Ventricle: Left ventricular ejection fraction, by estimation, is 60 to 65%. The left ventricle has normal function. The left ventricle has  MITRAL VALVE                TRICUSPID VALVE MV Area (PHT): 4.46 cm     TR Peak grad:   27.7 mmHg MV Decel Time: 170 msec     TR Vmax:        263.00 cm/s MV E velocity: 58.30 cm/s MV A velocity: 108.00 cm/s  SHUNTS MV E/A ratio:  0.54         Systemic VTI:  0.21 m                             Systemic Diam: 1.90 cm Dalton McleanMD Electronically signed by Wilfred Lacy Signature Date/Time: 03/13/2023/9:10:09 AM    Final (Updated)    VAS [place] LOWER EXTREMITY VENOUS (DVT)  Result Date: 03/12/2023  Lower Venous DVT Study Patient Name:  Becky Leach  Date of Exam:   03/12/2023 Medical Rec #: [id number]      Accession #:    [id number] Date of Birth: 10-11-1929       Patient Gender: F Patient Age:   27 years Exam Location:  [facility] Procedure:      VAS [place] LOWER EXTREMITY VENOUS (DVT) Referring Phys: Ramiro Harvest --------------------------------------------------------------------------------  Indications: Pulmonary embolism.  Risk Factors: PE & DVT 2018. Comparison Study: Previous exam on 05/12/2017 positive for DVT in CFV & FV Performing Technologist: Jody Hill RVT, RDMS  Examination Guidelines: A complete evaluation includes B-mode imaging, spectral Doppler, color Doppler, and power Doppler as needed of all accessible portions of each vessel. Bilateral testing is considered an integral part of a complete examination. Limited examinations for reoccurring indications may be performed as noted. The reflux portion of the exam is performed with the patient in reverse Trendelenburg.  +---------+---------------+---------+-----------+----------+--------------+ RIGHT    CompressibilityPhasicitySpontaneityPropertiesThrombus Aging +---------+---------------+---------+-----------+----------+--------------+ CFV      None           No       No                   Chronic        +---------+---------------+---------+-----------+----------+--------------+ SFJ      Partial                                      Acute          +---------+---------------+---------+-----------+----------+--------------+ FV Prox  Partial        Yes      Yes                  Acute          +---------+---------------+---------+-----------+----------+--------------+ FV Mid   Partial        Yes      Yes                  Acute           +---------+---------------+---------+-----------+----------+--------------+ FV DistalNone           No       No                   Acute          +---------+---------------+---------+-----------+----------+--------------+ PFV      None           No       No  omeprazole 40 MG capsule Commonly known as: PRILOSEC TAKE 1 CAPSULE (40 MG TOTAL) BY MOUTH DAILY.   ondansetron 4 MG tablet Commonly known as: ZOFRAN Take 1 tablet (4 mg total) by mouth every 6 (six) hours as needed for nausea.   senna-docusate 8.6-50 MG tablet Commonly known as: Senokot-S Take 1 tablet by mouth at bedtime as needed for mild constipation.   trospium 20 MG tablet Commonly known as: SANCTURA Take 20 mg by mouth at bedtime.   Vitamin D-3 25 MCG (1000 UT) Caps Take 1,000 Units by mouth daily.        Contact information for follow-up  providers     Mannam, Colbert Coyer, MD. Schedule an appointment as soon as possible for a visit in 4 week(s).   Specialty: Pulmonary Disease Contact information: [address] [place] [address] [phone]              Contact information for after-discharge care     Destination     [facility], [facility] Preferred SNF .   Service: Skilled Nursing Contact information: [address] [place] [address] [phone]                    Discharge Exam: Ceasar Mons Weights   03/11/23 2229  Weight: 45.8 kg   Vitals:   03/15/23 0445 03/15/23 1123  BP: (!) 149/105   Pulse: 87   Resp: 17   Temp: 97.6 F (36.4 C)   SpO2: 96% 97%   Examination: Physical Exam:  Constitutional: Thin elderly AAF in NAD Respiratory: Diminished to auscultation bilaterally with coarse breath sounds, no wheezing, rales, rhonchi or crackles. Normal respiratory effort and patient is not tachypenic. No accessory muscle use. Unlabored breathing and not wearing supplemental O2 via [place]  Cardiovascular: RRR, no murmurs / rubs / gallops. S1 and S2 auscultated. No extremity edema.  Abdomen: Soft, non-tender, non-distended. Bowel sounds positive.  GU: Deferred. Musculoskeletal: No clubbing / cyanosis of digits/nails. No joint deformity upper and lower extremities. Skin: No rashes, lesions, ulcers on a limited skin evaluation. No induration; Warm and dry.  Neurologic: CN 2-12 grossly intact with no focal deficits. Romberg sign and cerebellar reflexes not assessed.  Psychiatric: Normal judgment and insight. Alert and oriented x 3. Normal mood and appropriate affect.   Condition at discharge: stable  The results of significant diagnostics from this hospitalization (including imaging, microbiology, ancillary and laboratory) are listed below for reference.   Imaging Studies: DG Swallowing Func-Speech Pathology  Result Date: 03/13/2023 Table formatting from  the original result was not included. Modified Barium Swallow Study Patient Details Name: Becky Leach MRN: [id number] Date of Birth: 10-29-1929 Today's Date: 03/13/2023 HPI/PMH: HPI: Patient is a 89 y.o. female who presented on 03/12/23 with shortness of breath. Patient noted to have stopped Eliquis on 02/25/23 after recent hematuria. Urinalysis concerning for UTI.  Patient seen in the ED EKG with a sinus tachycardia with PACs, chest x-ray negative for any acute findings, CT angiogram chest done positive for bilateral upper PE. PMH significant for spinal stenosis, CKD 3b, unprovoked PE. MBS completed in 2022 with reccommendation onf Dys 3, nectar thick liquids. Clinical Impression: Clinical Impression: Patient seen by SLP for modified barium swallow study per hx of dysphagia. Patient presents with an overall functional oropharyngeal swallow. No aspiration or significant residuals were present at the time of this evaluation. One instance of penetration to the vocal cords (PAS 5) occured during swallow of nectar thick  Baseline vocal quality/speech: Normal No data recorded Volitional Swallow: Able to elicit Exam Limitations: No limitations Goal Planning: Prognosis for improved oropharyngeal function: Good No data recorded No data recorded No data recorded Consulted and agree with results and recommendations: Patient Pain: Pain Assessment Pain Assessment: No/denies pain  End of Session: Start Time:SLP Start Time (ACUTE ONLY): 0900 Stop Time: SLP Stop Time (ACUTE ONLY): 0930 Time Calculation:SLP Time Calculation (min) (ACUTE ONLY): 30 min Charges: SLP Evaluations $ SLP Speech Visit: 1 Visit SLP Evaluations $BSS Swallow: 1 Procedure $MBS Swallow: 1 Procedure SLP visit diagnosis: SLP Visit Diagnosis: Dysphagia, unspecified (R13.10) Past Medical History: Past Medical History: Diagnosis Date  AKI (acute kidney injury) (HCC)   due to NSAID  Arthritis   Closed fracture of left proximal humerus 09/14/2017  Constipation   DVT (deep venous thrombosis) (HCC)   Herniated disc   Hypertension   Pulmonary embolism (HCC)   Seasonal allergies   Spinal stenosis   Stomach ulcer   due to NSAID  Tachycardia   UTI (lower urinary tract infection)   Vitamin D deficiency 09/14/2017 Past Surgical History: Past Surgical History: Procedure Laterality Date  COLONOSCOPY  12/21/2011  Procedure: COLONOSCOPY;  Surgeon: [name], MD;  Location: [facility];  Service: Endoscopy;  Laterality: N/A;  DILATION AND CURETTAGE OF UTERUS    ORIF HUMERUS FRACTURE Left 09/12/2017  Procedure: OPEN REDUCTION INTERNAL FIXATION (ORIF) PROXIMAL  HUMERUS FRACTURE;  Surgeon: Myrene Galas, MD;  Location: [facility] OR;  Service: Orthopedics;  Laterality: Left;  TONSILLECTOMY AND ADENOIDECTOMY   Angela Nevin, MA, CCC-SLP Speech Therapy   ECHOCARDIOGRAM COMPLETE  Result Date: 03/13/2023    ECHOCARDIOGRAM REPORT   Patient Name:   Becky Leach Date of Exam: 03/13/2023 Medical Rec #:  [id number]     Height:       65.0 in Accession #:    [id number]    Weight:       101.0 lb Date of Birth:  November 21, 1929      BSA:          1.479 m Patient Age:    89 years      BP:           152/99 mmHg Patient Gender: F             HR:           94 bpm. Exam Location:  Inpatient Procedure: 2D Echo, Cardiac Doppler and Color Doppler Indications:     Pulmonary embolus  History:         Patient has prior history of Echocardiogram examinations.  Sonographer:      Darlys Gales Referring Phys:  [id number] DANIEL V THOMPSON Diagnosing Phys: Wilfred Lacy IMPRESSIONS  1. Left ventricular ejection fraction, by estimation, is 60 to 65%. The left ventricle has normal function. The left ventricle has no regional wall motion abnormalities. Left ventricular diastolic parameters are consistent with Grade I diastolic dysfunction (impaired relaxation).  2. Peak RV-RA gradient 27 mmHg. The IVC was not visualized. Right ventricular systolic function is low normal. The right ventricular size is normal.  3. The mitral valve is normal in structure. No evidence of mitral valve regurgitation. No evidence of mitral stenosis.  4. The aortic valve is tricuspid. There is mild calcification of the aortic valve. Aortic valve regurgitation is mild. No aortic stenosis is present. FINDINGS  Left Ventricle: Left ventricular ejection fraction, by estimation, is 60 to 65%. The left ventricle has normal function. The left ventricle has  MITRAL VALVE                TRICUSPID VALVE MV Area (PHT): 4.46 cm     TR Peak grad:   27.7 mmHg MV Decel Time: 170 msec     TR Vmax:        263.00 cm/s MV E velocity: 58.30 cm/s MV A velocity: 108.00 cm/s  SHUNTS MV E/A ratio:  0.54         Systemic VTI:  0.21 m                             Systemic Diam: 1.90 cm Dalton McleanMD Electronically signed by Wilfred Lacy Signature Date/Time: 03/13/2023/9:10:09 AM    Final (Updated)    VAS [place] LOWER EXTREMITY VENOUS (DVT)  Result Date: 03/12/2023  Lower Venous DVT Study Patient Name:  Becky Leach  Date of Exam:   03/12/2023 Medical Rec #: [id number]      Accession #:    [id number] Date of Birth: 10-11-1929       Patient Gender: F Patient Age:   27 years Exam Location:  [facility] Procedure:      VAS [place] LOWER EXTREMITY VENOUS (DVT) Referring Phys: Ramiro Harvest --------------------------------------------------------------------------------  Indications: Pulmonary embolism.  Risk Factors: PE & DVT 2018. Comparison Study: Previous exam on 05/12/2017 positive for DVT in CFV & FV Performing Technologist: Jody Hill RVT, RDMS  Examination Guidelines: A complete evaluation includes B-mode imaging, spectral Doppler, color Doppler, and power Doppler as needed of all accessible portions of each vessel. Bilateral testing is considered an integral part of a complete examination. Limited examinations for reoccurring indications may be performed as noted. The reflux portion of the exam is performed with the patient in reverse Trendelenburg.  +---------+---------------+---------+-----------+----------+--------------+ RIGHT    CompressibilityPhasicitySpontaneityPropertiesThrombus Aging +---------+---------------+---------+-----------+----------+--------------+ CFV      None           No       No                   Chronic        +---------+---------------+---------+-----------+----------+--------------+ SFJ      Partial                                      Acute          +---------+---------------+---------+-----------+----------+--------------+ FV Prox  Partial        Yes      Yes                  Acute          +---------+---------------+---------+-----------+----------+--------------+ FV Mid   Partial        Yes      Yes                  Acute           +---------+---------------+---------+-----------+----------+--------------+ FV DistalNone           No       No                   Acute          +---------+---------------+---------+-----------+----------+--------------+ PFV      None           No       No

## 2023-03-15 NOTE — Progress Notes (Signed)
Physical Therapy Treatment Patient Details Name: Becky Leach MRN: [id number] DOB: September 21, 1929 Today's Date: 03/15/2023   History of Present Illness 89 year old female history of spinal stenosis, CKD 3B, unprovoked PE in 2018 was on chronic anticoagulation, recent hematuria for which patient has been off Eliquis for approximately 2 weeks presenting to the ED with shortness of breath.  Patient noted to have stopped Eliquis on 02/25/2023 and has not had any gross hematuria since 02/28/2023 however became more short of breath.  Patient with nonproductive cough.  Patient denies any change in chronic leg swelling.  Patient also endorsed dysuria.  Urinalysis concerning for UTI.  Patient seen in the ED EKG with a sinus tachycardia with PACs, chest x-ray negative for any acute findings, CT angiogram chest done positive for bilateral upper PE, dopplers showed RLE DVT.    PT Comments  Pt tolerated increased ambulation distance of 140' with RW, no loss of balance, SpO2 97% on room air with ambulation. Pt performed seated BUE/LE exercises for strengthening. Pt ambulated to bathroom where she had a small, loose BM. Noted pt was on a purewick at start of session, she would benefit from ambulating to/from the bathroom.    If plan is discharge home, recommend the following: A little help with bathing/dressing/bathroom;Assistance with cooking/housework;Assist for transportation;Help with stairs or ramp for entrance   Can travel by private vehicle     Yes  Equipment Recommendations  None recommended by PT    Recommendations for Other Services       Precautions / Restrictions Precautions Precautions: Fall Precaution Comments: pt denies falls in the past 6 months Restrictions Weight Bearing Restrictions: No     Mobility  Bed Mobility Overal bed mobility: Needs Assistance Bed Mobility: Supine to Sit     Supine to sit: Supervision     General bed mobility comments: HOB up, used rail     Transfers Overall transfer level: Needs assistance Equipment used: Rolling walker (2 wheels) Transfers: Sit to/from Stand Sit to Stand: Contact guard assist, From elevated surface           General transfer comment: good hand placement, CGA for safety    Ambulation/Gait Ambulation/Gait assistance: Supervision Gait Distance (Feet): 140 Feet Assistive device: Rolling walker (2 wheels) Gait Pattern/deviations: Step-through pattern, Decreased stride length, Trunk flexed Gait velocity: decr     General Gait Details: VCs for positioning in RW and for posture, no loss of balance; HR 113 with walking, SpO2 97% on room air with ambulation, no dyspnea noted   Stairs             Wheelchair Mobility     Tilt Bed    Modified Rankin (Stroke Patients Only)       Balance Overall balance assessment: Mild deficits observed, not formally tested                                          Cognition Arousal: Alert Behavior During Therapy: WFL for tasks assessed/performed Overall Cognitive Status: Within Functional Limits for tasks assessed                                          Exercises General Exercises - Upper Extremity Shoulder Flexion: AROM, Both, Seated, 10 reps General Exercises - Lower Extremity Ankle Circles/Pumps: AROM,  Both, 10 reps, Seated Long Arc Quad: AROM, Both, 10 reps, Seated Hip Flexion/Marching: AROM, Both, 10 reps, Seated    General Comments        Pertinent Vitals/Pain Pain Assessment Pain Assessment: No/denies pain    Home Living                          Prior Function            PT Goals (current goals can now be found in the care plan section) Acute Rehab PT Goals Patient Stated Goal: to get stronger PT Goal Formulation: With patient Time For Goal Achievement: 03/27/23 Potential to Achieve Goals: Good Progress towards PT goals: Progressing toward goals    Frequency    Min  1X/week      PT Plan      Co-evaluation              AM-PAC PT "6 Clicks" Mobility   Outcome Measure  Help needed turning from your back to your side while in a flat bed without using bedrails?: None Help needed moving from lying on your back to sitting on the side of a flat bed without using bedrails?: A Little Help needed moving to and from a bed to a chair (including a wheelchair)?: A Little Help needed standing up from a chair using your arms (e.g., wheelchair or bedside chair)?: A Little Help needed to walk in hospital room?: A Little Help needed climbing 3-5 steps with a railing? : A Little 6 Click Score: 19    End of Session Equipment Utilized During Treatment: Gait belt Activity Tolerance: Patient tolerated treatment well Patient left: in chair;with chair alarm set;with call bell/phone within reach Nurse Communication: Mobility status PT Visit Diagnosis: Difficulty in walking, not elsewhere classified (R26.2)     Time: 1610-9604 PT Time Calculation (min) (ACUTE ONLY): 20 min  Charges:    $Gait Training: 8-22 mins PT General Charges $$ ACUTE PT VISIT: 1 Visit                     Tamala Ser PT 03/15/2023  Acute Rehabilitation Services  Office [phone]

## 2023-03-15 NOTE — Progress Notes (Signed)
Pt discharged to [facility] today per Dr. Marland Mcalpine. Pt's IV site d/c'd and WDL. Pt's VSS. Report called to receiving nurse at facility. Verbalized understanding. PTAR to transport.

## 2023-03-15 NOTE — TOC Progression Note (Signed)
Transition of Care [facility]) - Progression Note    Patient Details  Name: Becky Leach MRN: [id number] Date of Birth: [birth year]  [facility] CM/SW Contact  Eren Puebla, Olegario Messier, RN Phone Number: 03/15/2023, 10:37 AM  Clinical Narrative: [facility] rep [facility] has bed available if stable for d/c. MD updated.      Expected Discharge Plan: Skilled Nursing Facility Barriers to Discharge: Continued Medical Work up  Expected Discharge Plan and Services                                               Social Determinants of Health (SDOH) Interventions SDOH Screenings   Food Insecurity: No Food Insecurity (03/11/2023)  Housing: Low Risk  (03/11/2023)  Transportation Needs: No Transportation Needs (03/11/2023)  Utilities: Not At Risk (03/11/2023)  Depression (PHQ2-9): Medium Risk (03/19/2021)  Tobacco Use: Low Risk  (03/11/2023)    Readmission Risk Interventions     No data to display

## 2023-03-16 ENCOUNTER — Encounter: Payer: Self-pay | Admitting: Family Medicine

## 2023-03-16 ENCOUNTER — Telehealth: Payer: Self-pay

## 2023-03-16 ENCOUNTER — Telehealth: Payer: Self-pay | Admitting: Family Medicine

## 2023-03-16 DIAGNOSIS — I129 Hypertensive chronic kidney disease with stage 1 through stage 4 chronic kidney disease, or unspecified chronic kidney disease: Secondary | ICD-10-CM | POA: Diagnosis not present

## 2023-03-16 DIAGNOSIS — I2699 Other pulmonary embolism without acute cor pulmonale: Secondary | ICD-10-CM | POA: Diagnosis not present

## 2023-03-16 DIAGNOSIS — I82401 Acute embolism and thrombosis of unspecified deep veins of right lower extremity: Secondary | ICD-10-CM | POA: Diagnosis not present

## 2023-03-16 DIAGNOSIS — Z681 Body mass index (BMI) 19 or less, adult: Secondary | ICD-10-CM | POA: Diagnosis not present

## 2023-03-16 DIAGNOSIS — M6281 Muscle weakness (generalized): Secondary | ICD-10-CM | POA: Diagnosis not present

## 2023-03-16 DIAGNOSIS — Z9181 History of falling: Secondary | ICD-10-CM | POA: Diagnosis not present

## 2023-03-16 DIAGNOSIS — H409 Unspecified glaucoma: Secondary | ICD-10-CM | POA: Diagnosis not present

## 2023-03-16 DIAGNOSIS — E785 Hyperlipidemia, unspecified: Secondary | ICD-10-CM | POA: Diagnosis not present

## 2023-03-16 DIAGNOSIS — M6259 Muscle wasting and atrophy, not elsewhere classified, multiple sites: Secondary | ICD-10-CM | POA: Diagnosis not present

## 2023-03-16 DIAGNOSIS — I824Y1 Acute embolism and thrombosis of unspecified deep veins of right proximal lower extremity: Secondary | ICD-10-CM | POA: Diagnosis not present

## 2023-03-16 DIAGNOSIS — N3000 Acute cystitis without hematuria: Secondary | ICD-10-CM | POA: Diagnosis not present

## 2023-03-16 DIAGNOSIS — J9601 Acute respiratory failure with hypoxia: Secondary | ICD-10-CM | POA: Diagnosis not present

## 2023-03-16 DIAGNOSIS — R2681 Unsteadiness on feet: Secondary | ICD-10-CM | POA: Diagnosis not present

## 2023-03-16 DIAGNOSIS — J302 Other seasonal allergic rhinitis: Secondary | ICD-10-CM | POA: Diagnosis not present

## 2023-03-16 DIAGNOSIS — M6250 Muscle wasting and atrophy, not elsewhere classified, unspecified site: Secondary | ICD-10-CM | POA: Diagnosis not present

## 2023-03-16 DIAGNOSIS — R32 Unspecified urinary incontinence: Secondary | ICD-10-CM | POA: Diagnosis not present

## 2023-03-16 DIAGNOSIS — M159 Polyosteoarthritis, unspecified: Secondary | ICD-10-CM | POA: Diagnosis not present

## 2023-03-16 DIAGNOSIS — Z9189 Other specified personal risk factors, not elsewhere classified: Secondary | ICD-10-CM | POA: Diagnosis not present

## 2023-03-16 DIAGNOSIS — R911 Solitary pulmonary nodule: Secondary | ICD-10-CM | POA: Diagnosis not present

## 2023-03-16 DIAGNOSIS — M4986 Spondylopathy in diseases classified elsewhere, lumbar region: Secondary | ICD-10-CM | POA: Diagnosis not present

## 2023-03-16 DIAGNOSIS — Z7901 Long term (current) use of anticoagulants: Secondary | ICD-10-CM | POA: Diagnosis not present

## 2023-03-16 DIAGNOSIS — K5909 Other constipation: Secondary | ICD-10-CM | POA: Diagnosis not present

## 2023-03-16 DIAGNOSIS — N1832 Chronic kidney disease, stage 3b: Secondary | ICD-10-CM | POA: Diagnosis not present

## 2023-03-16 DIAGNOSIS — K219 Gastro-esophageal reflux disease without esophagitis: Secondary | ICD-10-CM | POA: Diagnosis not present

## 2023-03-16 DIAGNOSIS — M4800 Spinal stenosis, site unspecified: Secondary | ICD-10-CM | POA: Diagnosis not present

## 2023-03-16 DIAGNOSIS — Z8673 Personal history of transient ischemic attack (TIA), and cerebral infarction without residual deficits: Secondary | ICD-10-CM | POA: Diagnosis not present

## 2023-03-16 DIAGNOSIS — M48061 Spinal stenosis, lumbar region without neurogenic claudication: Secondary | ICD-10-CM | POA: Diagnosis not present

## 2023-03-16 DIAGNOSIS — B964 Proteus (mirabilis) (morganii) as the cause of diseases classified elsewhere: Secondary | ICD-10-CM | POA: Diagnosis not present

## 2023-03-16 LAB — CULTURE, BLOOD (ROUTINE X 2)
Culture: NO GROWTH
Culture: NO GROWTH
Special Requests: ADEQUATE
Special Requests: ADEQUATE

## 2023-03-16 NOTE — Telephone Encounter (Addendum)
Talked to family regarding patient leaving hospital.  Patient culture came back day before when patient went to ED for shortness of breath and had PE and UTI and got treated. Patient is now in rehab and doing better. They restarted her eliquis.

## 2023-03-16 NOTE — Telephone Encounter (Signed)
Patients son calls nurse line requesting to speak with [place].   He reports he needs the referral for home health to go to [facility]. He reports this needs to be processed through a company called [employer].   He reports she is unable to bathe herself or feed herself. He reports she lives alone and can not do most ADLs.   Advised will forward to [facility].  He reports you can call him with any questions.

## 2023-03-20 DIAGNOSIS — Z7189 Other specified counseling: Secondary | ICD-10-CM | POA: Diagnosis not present

## 2023-03-20 DIAGNOSIS — I2699 Other pulmonary embolism without acute cor pulmonale: Secondary | ICD-10-CM | POA: Diagnosis not present

## 2023-03-20 DIAGNOSIS — K219 Gastro-esophageal reflux disease without esophagitis: Secondary | ICD-10-CM | POA: Diagnosis not present

## 2023-03-20 DIAGNOSIS — R319 Hematuria, unspecified: Secondary | ICD-10-CM | POA: Diagnosis not present

## 2023-03-20 DIAGNOSIS — M4986 Spondylopathy in diseases classified elsewhere, lumbar region: Secondary | ICD-10-CM | POA: Diagnosis not present

## 2023-03-20 DIAGNOSIS — I82401 Acute embolism and thrombosis of unspecified deep veins of right lower extremity: Secondary | ICD-10-CM | POA: Diagnosis not present

## 2023-03-20 DIAGNOSIS — R911 Solitary pulmonary nodule: Secondary | ICD-10-CM | POA: Diagnosis not present

## 2023-03-20 DIAGNOSIS — Z1331 Encounter for screening for depression: Secondary | ICD-10-CM | POA: Diagnosis not present

## 2023-03-20 DIAGNOSIS — N390 Urinary tract infection, site not specified: Secondary | ICD-10-CM | POA: Diagnosis not present

## 2023-03-24 ENCOUNTER — Telehealth: Payer: Self-pay

## 2023-03-24 ENCOUNTER — Ambulatory Visit: Payer: Medicare Other

## 2023-03-24 VITALS — Ht 65.0 in | Wt 100.0 lb

## 2023-03-24 DIAGNOSIS — Z0000 Encounter for general adult medical examination without abnormal findings: Secondary | ICD-10-CM

## 2023-03-24 DIAGNOSIS — Z86711 Personal history of pulmonary embolism: Secondary | ICD-10-CM

## 2023-03-24 DIAGNOSIS — I2699 Other pulmonary embolism without acute cor pulmonale: Secondary | ICD-10-CM

## 2023-03-24 DIAGNOSIS — R262 Difficulty in walking, not elsewhere classified: Secondary | ICD-10-CM

## 2023-03-24 DIAGNOSIS — R296 Repeated falls: Secondary | ICD-10-CM

## 2023-03-24 NOTE — Patient Instructions (Signed)
Becky Leach , Thank you for taking time to come for your Medicare Wellness Visit. I appreciate your ongoing commitment to your health goals. Please review the following plan we discussed and let me know if I can assist you in the future.   Referrals/Orders/Follow-Ups/Clinician Recommendations: Aim for 30 minutes of exercise or brisk walking, 6-8 glasses of water, and 5 servings of fruits and vegetables each day.  This is a list of the screening recommended for you and due dates:  Health Maintenance  Topic Date Due   Zoster (Shingles) Vaccine (1 of 2) Never done   COVID-19 Vaccine (1 - 2023-24 season) Never done   Medicare Annual Wellness Visit  03/23/2024   DTaP/Tdap/Td vaccine (2 - Td or Tdap) 09/12/2027   Pneumonia Vaccine  Completed   Flu Shot  Completed   HPV Vaccine  Aged Out   DEXA scan (bone density measurement)  Discontinued    Advanced directives: (ACP Link)Information on Advanced Care Planning can be found at [facility] Advance Health Care Directives Advance Health Care Directives ([email or web address]   Next Medicare Annual Wellness Visit scheduled for next year: Yes

## 2023-03-24 NOTE — Telephone Encounter (Signed)
Patient seen for AWV she was recently in the hospital and discharged to a SNF.   She has since been discharged from SNF and family is need of assistance in managing care.  Please advise.

## 2023-03-24 NOTE — Progress Notes (Signed)
Subjective:   Becky Leach is a 89 y.o. female who presents for an Initial Medicare Annual Wellness Visit.  Visit Complete: Virtual I connected with  Salena Saner on 03/24/23 by a audio enabled telemedicine application and verified that I am speaking with the correct person using two identifiers.  Patient Location: Home  Provider Location: Home Office  I discussed the limitations of evaluation and management by telemedicine. The patient expressed understanding and agreed to proceed.  Vital Signs: Because this visit was a virtual/telehealth visit, some criteria may be missing or patient reported. Any vitals not documented were not able to be obtained and vitals that have been documented are patient reported.  Cardiac Risk Factors include: advanced age (>85men, >81 women);sedentary lifestyle;hypertension     Objective:    Today's Vitals   03/24/23 1215  Weight: 100 lb (45.4 kg)  Height: 5\' 5"  (1.651 m)   Body mass index is 16.64 kg/m.     03/11/2023   10:48 PM 02/27/2023    3:56 PM 03/19/2021   10:04 AM 09/08/2020    2:03 PM 07/08/2020   11:06 AM 02/17/2020    2:27 PM 03/19/2018    2:37 PM  Advanced Directives  Does Patient Have a Medical Advance Directive? No No No No No No No  Would patient like information on creating a medical advance directive? No - Patient declined  No - Patient declined No - Patient declined No - Patient declined No - Patient declined No - Patient declined    Current Medications (verified) Outpatient Encounter Medications as of 03/24/2023  Medication Sig   acetaminophen (TYLENOL) 325 MG tablet Take 2 tablets (650 mg total) by mouth every 6 (six) hours as needed for mild pain (pain score 1-3) (or Fever >/= 101).   amLODipine (NORVASC) 2.5 MG tablet Take 1 tablet (2.5 mg total) by mouth daily.   apixaban (ELIQUIS) 5 MG TABS tablet Take 2 tablets (10 mg total) by mouth 2 (two) times daily for 7 days, THEN 1 tablet (5 mg total) 2 (two) times daily.    atorvastatin (LIPITOR) 40 MG tablet TAKE 1 TABLET BY MOUTH EVERY DAY   Cholecalciferol (VITAMIN D-3) 25 MCG (1000 UT) CAPS Take 1,000 Units by mouth daily.   feeding supplement (ENSURE ENLIVE / ENSURE PLUS) LIQD Take 237 mLs by mouth 2 (two) times daily between meals.   latanoprost (XALATAN) 0.005 % ophthalmic solution Place 1 drop into both eyes at bedtime.   loratadine (CLARITIN) 10 MG tablet Take 1 tablet (10 mg total) by mouth daily.   MYRBETRIQ 50 MG TB24 tablet Take 50 mg by mouth in the morning.   omeprazole (PRILOSEC) 40 MG capsule TAKE 1 CAPSULE (40 MG TOTAL) BY MOUTH DAILY.   ondansetron (ZOFRAN) 4 MG tablet Take 1 tablet (4 mg total) by mouth every 6 (six) hours as needed for nausea.   senna-docusate (SENOKOT-S) 8.6-50 MG tablet Take 1 tablet by mouth at bedtime as needed for mild constipation.   Trolamine Salicylate (ASPERCREME EX) Apply 1 application  topically as needed (for pain).   trospium (SANCTURA) 20 MG tablet Take 20 mg by mouth at bedtime.   No facility-administered encounter medications on file as of 03/24/2023.    Allergies (verified) Nsaids and Penicillins   History: Past Medical History:  Diagnosis Date   AKI (acute kidney injury) (HCC)    due to NSAID   Arthritis    Closed fracture of left proximal humerus 09/14/2017   Constipation  DVT (deep venous thrombosis) (HCC)    Herniated disc    Hypertension    Pulmonary embolism (HCC)    Seasonal allergies    Spinal stenosis    Stomach ulcer    due to NSAID   Tachycardia    UTI (lower urinary tract infection)    Vitamin D deficiency 09/14/2017   Past Surgical History:  Procedure Laterality Date   COLONOSCOPY  12/21/2011   Procedure: COLONOSCOPY;  Surgeon: [name], MD;  Location: [facility];  Service: Endoscopy;  Laterality: N/A;   DILATION AND CURETTAGE OF UTERUS     ORIF HUMERUS FRACTURE Left 09/12/2017   Procedure: OPEN REDUCTION INTERNAL FIXATION (ORIF) PROXIMAL  HUMERUS FRACTURE;  Surgeon:  Myrene Galas, MD;  Location: [facility] OR;  Service: Orthopedics;  Laterality: Left;   TONSILLECTOMY AND ADENOIDECTOMY     Family History  Problem Relation Age of Onset   Arthritis Mother    Heart attack Mother    Social History   Socioeconomic History   Marital status: Single    Spouse name: Not on file   Number of children: 4   Years of education: Not on file   Highest education level: Not on file  Occupational History   Not on file  Tobacco Use   Smoking status: Never   Smokeless tobacco: Never  Vaping Use   Vaping status: Never Used  Substance and Sexual Activity   Alcohol use: No   Drug use: No   Sexual activity: Never  Other Topics Concern   Not on file  Social History Narrative   Born: [place], [place]   Lives with son, Beverely Pace.            Social Determinants of Health   Financial Resource Strain: Low Risk  (03/24/2023)   Overall Financial Resource Strain (CARDIA)    Difficulty of Paying Living Expenses: Not hard at all  Food Insecurity: No Food Insecurity (03/24/2023)   Hunger Vital Sign    Worried About Running Out of Food in the Last Year: Never true    Ran Out of Food in the Last Year: Never true  Transportation Needs: No Transportation Needs (03/24/2023)   PRAPARE - Administrator, Civil Service (Medical): No    Lack of Transportation (Non-Medical): No  Physical Activity: Inactive (03/24/2023)   Exercise Vital Sign    Days of Exercise per Week: 0 days    Minutes of Exercise per Session: 0 min  Stress: No Stress Concern Present (03/24/2023)   [employer] of Occupational Health - Occupational Stress Questionnaire    Feeling of Stress : Not at all  Social Connections: Moderately Isolated (03/24/2023)   Social Connection and Isolation Panel [NHANES]    Frequency of Communication with Friends and Family: More than three times a week    Frequency of Social Gatherings with Friends and Family: Three times a week    Attends Religious Services: 1  to 4 times per year    Active Member of Clubs or Organizations: No    Attends Banker Meetings: Never    Marital Status: Never married    Tobacco Counseling Counseling given: Not Answered   Clinical Intake:  Pre-visit preparation completed: Yes  Pain : No/denies pain     Diabetes: No  How often do you need to have someone help you when you read instructions, pamphlets, or other written materials from your doctor or pharmacy?: 1 - Never  Interpreter Needed?: No  Comments: Assisted with  visit by son- Jenica Szymkowiak Information entered by :: Kandis Fantasia LPN   Activities of Daily Living    03/24/2023   12:24 PM 03/11/2023   10:51 PM  In your present state of health, do you have any difficulty performing the following activities:  Hearing? 0   Vision? 0   Difficulty concentrating or making decisions? 0   Walking or climbing stairs? 0   Dressing or bathing? 0   Doing errands, shopping? 0 0  Preparing Food and eating ? N   Using the Toilet? N   In the past six months, have you accidently leaked urine? N   Do you have problems with loss of bowel control? N   Managing your Medications? N   Managing your Finances? N   Housekeeping or managing your Housekeeping? N     Patient Care Team: Erick Alley, DO as PCP - General (Family Medicine)  Indicate any recent Medical Services you may have received from other than [facility] providers in the past year (date may be approximate).     Assessment:   This is a routine wellness examination for [facility].  Hearing/Vision screen Hearing Screening - Comments:: Denies hearing difficulties   Vision Screening - Comments:: No vision problems; will schedule routine eye exam soon     Goals Addressed             This Visit's Progress    Prevent falls        Depression Screen    03/24/2023   12:21 PM 03/19/2021   10:04 AM 09/08/2020    2:02 PM 07/08/2020   11:07 AM 02/17/2020    2:32 PM 04/30/2018    2:27 PM  03/19/2018    2:37 PM  PHQ 2/9 Scores  PHQ - 2 Score 0 2 4 2 2 2  0  PHQ- 9 Score  5 15 6 12 3      Fall Risk    03/24/2023   12:23 PM 03/02/2023    1:53 PM 02/27/2023    3:54 PM 01/12/2023   10:47 AM 09/08/2020    2:02 PM  Fall Risk   Falls in the past year? 1 0 0 0 1  Number falls in past yr: 0 0 0 0 0  Injury with Fall? 0 0 0 0 0  Risk for fall due to : Impaired balance/gait;Impaired mobility;History of fall(s)      Follow up Falls prevention discussed;Education provided;Falls evaluation completed        MEDICARE RISK AT HOME: Medicare Risk at Home Any stairs in or around the home?: No If so, are there any without handrails?: No Home free of loose throw rugs in walkways, pet beds, electrical cords, etc?: Yes Adequate lighting in your home to reduce risk of falls?: Yes Life alert?: No Use of a cane, walker or w/c?: Yes Grab bars in the bathroom?: Yes Shower chair or bench in shower?: No Elevated toilet seat or a handicapped toilet?: Yes  TIMED UP AND GO:  Was the test performed? No    Cognitive Function:        03/24/2023   12:25 PM  6CIT Screen  What Year? 0 points  What month? 0 points  What time? 0 points  Count back from 20 0 points  Months in reverse 2 points  Repeat phrase 4 points  Total Score 6 points    Immunizations Immunization History  Administered Date(s) Administered   Fluad Quad(high Dose 65+) 02/13/2019, 03/08/2022   Fluad Trivalent(High Dose  65+) 02/27/2023   Influenza,inj,Quad PF,6+ Mos 02/04/2014, 03/17/2015, 02/10/2016, 03/10/2017, 03/19/2018, 02/18/2020   Influenza-Unspecified 02/15/2021   Pneumococcal Conjugate-13 02/04/2014   Pneumococcal Polysaccharide-23 12/18/2011   Tdap 09/11/2017    TDAP status: Up to date  Flu Vaccine status: Due, Education has been provided regarding the importance of this vaccine. Advised may receive this vaccine at local pharmacy or Health Dept. Aware to provide a copy of the vaccination record if  obtained from local pharmacy or Health Dept. Verbalized acceptance and understanding.  Pneumococcal vaccine status: Up to date  Covid-19 vaccine status: Information provided on how to obtain vaccines.   Qualifies for Shingles Vaccine? Yes   Zostavax completed No   Shingrix Completed?: No.    Education has been provided regarding the importance of this vaccine. Patient has been advised to call insurance company to determine out of pocket expense if they have not yet received this vaccine. Advised may also receive vaccine at local pharmacy or Health Dept. Verbalized acceptance and understanding.  Screening Tests Health Maintenance  Topic Date Due   Zoster Vaccines- Shingrix (1 of 2) Never done   COVID-19 Vaccine (1 - 2023-24 season) Never done   Medicare Annual Wellness (AWV)  03/23/2024   DTaP/Tdap/Td (2 - Td or Tdap) 09/12/2027   Pneumonia Vaccine 74+ Years old  Completed   INFLUENZA VACCINE  Completed   HPV VACCINES  Aged Out   DEXA SCAN  Discontinued    Health Maintenance  Health Maintenance Due  Topic Date Due   Zoster Vaccines- Shingrix (1 of 2) Never done   COVID-19 Vaccine (1 - 2023-24 season) Never done    Colorectal cancer screening: No longer required.   Mammogram status: No longer required due to age and preference.  Bone Density status:  No longer required  Lung Cancer Screening: (Low Dose CT Chest recommended if Age 22-80 years, 20 pack-year currently smoking OR have quit w/in 15years.) does not qualify.   Lung Cancer Screening Referral: n/a  Additional Screening:  Hepatitis C Screening: does not qualify  Vision Screening: Recommended annual ophthalmology exams for early detection of glaucoma and other disorders of the eye. Is the patient up to date with their annual eye exam?  No  Who is the provider or what is the name of the office in which the patient attends annual eye exams? none If pt is not established with a provider, would they like to be referred  to a provider to establish care? No .   Dental Screening: Recommended annual dental exams for proper oral hygiene  Community Resource Referral / Chronic Care Management: CRR required this visit?  No   CCM required this visit?  No     Plan:     I have personally reviewed and noted the following in the patient's chart:   Medical and social history Use of alcohol, tobacco or illicit drugs  Current medications and supplements including opioid prescriptions. Patient is not currently taking opioid prescriptions. Functional ability and status Nutritional status Physical activity Advanced directives List of other physicians Hospitalizations, surgeries, and ER visits in previous 12 months Vitals Screenings to include cognitive, depression, and falls Referrals and appointments  In addition, I have reviewed and discussed with patient certain preventive protocols, quality metrics, and best practice recommendations. A written personalized care plan for preventive services as well as general preventive health recommendations were provided to patient.     Kandis Fantasia [place], [place]   16/02/9603   After Visit Summary: (MyChart) Due to  this being a telephonic visit, the after visit summary with patients personalized plan was offered to patient via MyChart   Nurse Notes: See telephone note

## 2023-03-24 NOTE — Telephone Encounter (Signed)
Patient's son calls nurse line regarding medication questions post discharge from rehab.   He is asking if patient is supposed to be on amlodipine. Reports that she has had issues with BP running low in the past. However, at recent hospitalization BP was elevated and amlodipine 2.5 mg was ordered.   Fell on Wednesday- no LOC, hit head on walker, slight bruise. Prior to fall, patient felt weakness in upper body and son reports "trembling" in her arms. Seemed groggy yesterday.   Son states that she seems back to normal today. Answering questions appropriately. Denies facial drooping, speech normal, no issues with gait/balance.   Advised evaluation today due to recent fall while also being on Eliquis. Son states that he will closely observe patient and cal 911 if she has any changes.   Veronda Prude, RN

## 2023-03-27 ENCOUNTER — Telehealth: Payer: Self-pay | Admitting: *Deleted

## 2023-03-27 ENCOUNTER — Telehealth: Payer: Self-pay | Admitting: Student

## 2023-03-27 NOTE — Telephone Encounter (Signed)
Spoke on the phone with pt's son [name] regarding needing more care at home He states a PCA can come 2.5 hours/day to help with bathing/food/dressing but he says she needs more care.  He is scheduled to talk with [facility] SW in 2 days  Does have apt with me for f/u on 04/03/2023.

## 2023-03-27 NOTE — Progress Notes (Signed)
Care Coordination   Note   03/27/2023 Name: Becky Leach MRN: [id number] DOB: [birth year]  Becky Leach is a 89 y.o. year old female who sees Erick Alley, DO for primary care. I reached out to Salena Saner by phone today to offer care coordination services.  [name] was given information about Care Coordination services today including:   The Care Coordination services include support from the care team which includes your Nurse Coordinator, Clinical Social Worker, or Pharmacist.  The Care Coordination team is here to help remove barriers to the health concerns and goals most important to you. Care Coordination services are voluntary, and the patient may decline or stop services at any time by request to their care team member.   Care Coordination Consent Status: Patient son Becky Leach DPR on file agreed to services and verbal consent obtained.   Follow up plan:  Telephone appointment with care coordination team member scheduled for:  10/31  Encounter Outcome:  Patient Scheduled  [facility] Coordination Care Guide  Direct Dial: [phone]

## 2023-03-30 ENCOUNTER — Ambulatory Visit: Payer: Self-pay | Admitting: Licensed Clinical Social Worker

## 2023-03-30 NOTE — Patient Outreach (Signed)
Care Coordination   Initial Visit Note   03/30/2023 Name: Becky Leach MRN: [id number] DOB: 07-02-1929  Becky Leach is a 89 y.o. year old female who sees Becky Alley, DO for primary care. I spoke with  Becky Leach by phone today.  What matters to the patients health and wellness today?  Becky Leach request assistance with locating resources for caregiver support and personal care assistance for mother Becky Leach.    Goals Addressed             This Visit's Progress    Care Coordination       Activities and task to complete in order to accomplish goals.   LEVELS OF CARE TASK Call to inquire about the PACE program [phone]  ( ) In-Home Aide program at Department of Social Service-call to be placed on wait list [phone] Review private pay home care options provided and discussed         SDOH assessments and interventions completed:  Yes  SDOH Interventions Today    Flowsheet Row Most Recent Value  SDOH Interventions   Food Insecurity Interventions Intervention Not Indicated  Housing Interventions Intervention Not Indicated  Utilities Interventions Intervention Not Indicated        Care Coordination Interventions:  Yes, provided   Interventions Today    Flowsheet Row Most Recent Value  Chronic Disease   Chronic disease during today's visit Hypertension (HTN), Other  [Urinary Tract infection]  General Interventions   General Interventions Discussed/Reviewed Level of Care, General Interventions Discussed, Doctor Visits  Doctor Visits Discussed/Reviewed Doctor Visits Discussed  [Pt son Becky Leach reports patient urologist is [facility]]  Level of Care Adult Daycare, Personal Care Services  [facility] is offering 21/2 hours for seven days a week for home care. Pt son Becky Leach is requesting additional assistance.]  Nutrition Interventions   Nutrition Discussed/Reviewed Nutrition Discussed  [Becky Leach reports pt needs soft food due to  chewing difficulty]        Follow up plan: Follow up call scheduled for 04/07/2023    Encounter Outcome:  Patient Visit Completed   Becky Leach MSW, LCSW Licensed Clinical Social Worker  [facility], Population Health Direct Dial: [phone]  Fax: [phone]

## 2023-03-31 DIAGNOSIS — R310 Gross hematuria: Secondary | ICD-10-CM | POA: Diagnosis not present

## 2023-03-31 DIAGNOSIS — N311 Reflex neuropathic bladder, not elsewhere classified: Secondary | ICD-10-CM | POA: Diagnosis not present

## 2023-03-31 NOTE — Telephone Encounter (Signed)
LCSW A. Felton Clinton contacted Mr. Becky Leach to advised per [employer]fer Liftss patient is approved for 80 hours of persona care assistance. [facility] is the agency scheduled to begin rendering care to Ms. Becky Leach. LCSW A. Felton Clinton faxed referral to [facility] program for additional service. Mr. Becky Leach is aware there is a waiting list.

## 2023-04-03 ENCOUNTER — Ambulatory Visit: Payer: Medicare Other | Admitting: Student

## 2023-04-03 DIAGNOSIS — H34831 Tributary (branch) retinal vein occlusion, right eye, with macular edema: Secondary | ICD-10-CM | POA: Diagnosis not present

## 2023-04-10 ENCOUNTER — Ambulatory Visit: Payer: Self-pay | Admitting: Licensed Clinical Social Worker

## 2023-04-10 NOTE — Patient Outreach (Signed)
  Care Coordination   Follow Up Visit Note   04/10/2023 Name: SENAIDA KLINGLER MRN: [id number] DOB: Jan 23, 1930  TISHINA PASTRANO is a 89 y.o. year old female who sees Erick Alley, DO for primary care. I spoke with  Alphonzo Cruise by phone today.  What matters to the patients health and wellness today?  Follow up regarding in home visit with [facility].     Goals Addressed             This Visit's Progress    COMPLETED: Care Coordination       Activities and task to complete in order to accomplish goals.   LEVELS OF CARE TASK Call to inquire about the PACE program [phone]  ( ) In-Home Aide program at Department of Social Service-call to be placed on wait list [phone] Review private pay home care options provided and discussed         SDOH assessments and interventions completed:  Yes     Care Coordination Interventions:  No, not indicated   Follow up plan: No further intervention required.   Encounter Outcome:  Patient Visit Completed   Gwyndolyn Saxon MSW, LCSW Licensed Clinical Social Worker  [employer], Population Health Direct Dial: [phone]  Fax: [phone]

## 2023-04-19 ENCOUNTER — Telehealth: Payer: Self-pay

## 2023-04-19 NOTE — Telephone Encounter (Signed)
Patients son calls nurse line requesting PCP recommendation on travel.   He reports they will be traveling for the Thanksgiving holiday via car. He reports a 4  hour car ride on Tuesday there and a 4 hour car ride on Monday back.   He reports she seems up to it, however he wants to check with her PCP first.   Advised will forward to PCP.

## 2023-04-20 NOTE — Telephone Encounter (Signed)
Called pt's son about thanksgiving travel plans - going to visit family in [place].  Plan to drive 2 hrs, have lunch then drive the remaining 2 hours. Want to know if this is safe.  She is still on Eliquis. Does do well in the car when going to doctor apts but hasn't been in the car for hours in a long time.   We discussed bringing all of her medications, mobility equipment such as her walker and anything else to make her comfortable, brining something to keep her occupied in the care, being very careful when getting in and out of the car, should always have someone helping her with transfers to reduce fall risk. She does not have dementia so low concern for delirium with going to a new place. Wished them a happy thanksgiving and good visit with family.

## 2023-05-05 ENCOUNTER — Ambulatory Visit (INDEPENDENT_AMBULATORY_CARE_PROVIDER_SITE_OTHER): Payer: Medicare Other | Admitting: Student

## 2023-05-05 ENCOUNTER — Encounter: Payer: Self-pay | Admitting: Student

## 2023-05-05 VITALS — BP 137/87 | HR 98 | Ht 65.0 in | Wt 104.2 lb

## 2023-05-05 DIAGNOSIS — Z23 Encounter for immunization: Secondary | ICD-10-CM

## 2023-05-05 DIAGNOSIS — M79645 Pain in left finger(s): Secondary | ICD-10-CM | POA: Diagnosis not present

## 2023-05-05 DIAGNOSIS — Z7189 Other specified counseling: Secondary | ICD-10-CM

## 2023-05-05 NOTE — Progress Notes (Signed)
    SUBJECTIVE:   CHIEF COMPLAINT / HPI:   Son accompanies pt   Sore thumb L thumb has been sore where nail was cut short for at least a month. Has been applying capzasin, not helping.  No drainage.  GOC Patient currently has a PCA coming once daily which has been helpful, after PCA leaves, son [name] takes over which has been taxing for him.  She previously received PT at home but states she does not feel she needs this currently as she is doing some PT with her PCA and is continuing to do her exercises.  We have discussed anticoagulation several times in the past, send patient preferred to keep her on it and are very careful to reduce fall risks.  She does have a living will on file naming her son [name] as her healthcare agent.   PERTINENT  PMH / PSH: Impaired ambulation, history of TIA, DVT and PE on chronic anticoagulation  OBJECTIVE:   BP 137/87   Pulse 98   Ht 5\' 5"  (1.651 m)   Wt 104 lb 3.2 oz (47.3 kg)   SpO2 98%   BMI 17.34 kg/m    General: Frail 89 year old female, NAD, pleasant Cardiac: RRR Respiratory: Breathing comfortably on room air Extremities: Left thumb with minimal erythema/inflammation on the medial portion where nail is cut short and is TTP.  No edema, warmth or drainage Skin: warm and dry Neuro: alert, no obvious focal deficits Psych: Normal affect and mood   ASSESSMENT/PLAN:   Goals of care, counseling/discussion Patient currently has a PCA coming once daily which has been helpful, after PCA leaves, son [name] takes over which has been taxing for him.  She previously received PT at home but states she does not feel she needs this currently as she is doing some PT with her PCA and is continuing to do her exercises.  We have discussed anticoagulation several times in the past, send patient preferred to keep her on it and are very careful to reduce fall risks.  She does have a living will on file naming her son [name] as her healthcare agent.  Thumb pain,  left Likely due to nail being cut too short, is tender to palpation.  Advised to stop using capsaicin.  Instead, can apply Aquaphor twice daily and continue to monitor and avoid cutting nails too short.  Cannot take or apply NSAIDs (on eliquis). To reduce inflammation, can consider soaking in warm concentrated salt water although discussed the importance of drying the skin completely afterwards. -Return or schedule video visit if worsens or no improvement   Patient received COVID-vaccine today Shingrix vaccine ordered  Dr. Erick Alley, DO  [facility]

## 2023-05-05 NOTE — Patient Instructions (Signed)
It was great to see you! Thank you for allowing me to participate in your care!  I recommend that you always bring your medications to each appointment as this makes it easy to ensure you are on the correct medications and helps [place] not miss when refills are needed.  Our plans for today:  - Can try soaking finger in warm salt water to reduce inflammation  - Apply Aquaphor twice daily to finger   Take care and seek immediate care sooner if you develop any concerns.   Dr. Erick Alley, DO [facility]

## 2023-05-06 DIAGNOSIS — Z7189 Other specified counseling: Secondary | ICD-10-CM | POA: Insufficient documentation

## 2023-05-06 DIAGNOSIS — M79645 Pain in left finger(s): Secondary | ICD-10-CM | POA: Insufficient documentation

## 2023-05-06 MED ORDER — ZOSTER VAC RECOMB ADJUVANTED 50 MCG/0.5ML IM SUSR: 0.5000 mL | Freq: Once | INTRAMUSCULAR | 0 refills | Status: AC

## 2023-05-06 NOTE — Assessment & Plan Note (Signed)
Patient currently has a PCA coming once daily which has been helpful, after PCA leaves, son Becky Leach takes over which has been taxing for him.  She previously received PT at home but states she does not feel she needs this currently as she is doing some PT with her PCA and is continuing to do her exercises.  We have discussed anticoagulation several times in the past, send patient preferred to keep her on it and are very careful to reduce fall risks.  She does have a living will on file naming her son Becky Leach as her healthcare agent.

## 2023-05-06 NOTE — Assessment & Plan Note (Signed)
Likely due to nail being cut too short, is tender to palpation.  Advised to stop using capsaicin.  Instead, can apply Aquaphor twice daily and continue to monitor and avoid cutting nails too short.  Cannot take or apply NSAIDs (on eliquis). To reduce inflammation, can consider soaking in warm concentrated salt water although discussed the importance of drying the skin completely afterwards. -Return or schedule video visit if worsens or no improvement

## 2023-05-08 ENCOUNTER — Telehealth: Payer: Self-pay | Admitting: Student

## 2023-05-08 NOTE — Telephone Encounter (Signed)
Patient's son dropped off [employer] form to be completed. Last DOS was 05/05/23. Placed in [employer].

## 2023-05-08 NOTE — Telephone Encounter (Signed)
Form has been placed in your box to be completed, please finish when you have time. Thanks! Penni Bombard CMA

## 2023-05-11 ENCOUNTER — Telehealth: Payer: Self-pay | Admitting: Student

## 2023-05-11 DIAGNOSIS — Z9181 History of falling: Secondary | ICD-10-CM

## 2023-05-11 DIAGNOSIS — R262 Difficulty in walking, not elsewhere classified: Secondary | ICD-10-CM

## 2023-05-11 DIAGNOSIS — Z7901 Long term (current) use of anticoagulants: Secondary | ICD-10-CM

## 2023-05-11 DIAGNOSIS — M170 Bilateral primary osteoarthritis of knee: Secondary | ICD-10-CM

## 2023-05-11 NOTE — Telephone Encounter (Cosign Needed Addendum)
Called son to discuss CAP paperwork.   States pt was previously receiving PT which did not continue after last hospitalization as he thought she would get adequate PT/mobility with the home health aid. Now states she could benefit from home health OT and PT given her limited mobility in setting of OA and history of falls. She is also still on chronic AC so preventing falls is very important. I agree that PT/OT is needed and will place new order.   Has increased secretions for months. Causes her to spit frequently and is annoying. No SOB, sick symptoms or cough. Has been using 1/2 dose OTC musinex which son thinks helps thin the secretions. Discussed that this is safe if he feels it is beneficial. Could consider anticholinergics however would be hesitant with her age. Can discuss further if needed.   I recommended pt could benefit from apt in geri clinic which son agrees with. Will discuss with Dr. McDiarmid.

## 2023-05-16 ENCOUNTER — Encounter: Payer: Self-pay | Admitting: Family Medicine

## 2023-05-16 DIAGNOSIS — R636 Underweight: Secondary | ICD-10-CM | POA: Insufficient documentation

## 2023-05-16 NOTE — Telephone Encounter (Signed)
Attending Attestation:  Reviewed and agree with need for physical therapy/OT.  Levert Feinstein, MD, [facility]

## 2023-05-20 DIAGNOSIS — M25512 Pain in left shoulder: Secondary | ICD-10-CM | POA: Diagnosis not present

## 2023-05-20 DIAGNOSIS — N3281 Overactive bladder: Secondary | ICD-10-CM | POA: Diagnosis not present

## 2023-05-20 DIAGNOSIS — M6250 Muscle wasting and atrophy, not elsewhere classified, unspecified site: Secondary | ICD-10-CM | POA: Diagnosis not present

## 2023-05-20 DIAGNOSIS — R634 Abnormal weight loss: Secondary | ICD-10-CM | POA: Diagnosis not present

## 2023-05-20 DIAGNOSIS — J9601 Acute respiratory failure with hypoxia: Secondary | ICD-10-CM | POA: Diagnosis not present

## 2023-05-20 DIAGNOSIS — D751 Secondary polycythemia: Secondary | ICD-10-CM | POA: Diagnosis not present

## 2023-05-20 DIAGNOSIS — H409 Unspecified glaucoma: Secondary | ICD-10-CM | POA: Diagnosis not present

## 2023-05-20 DIAGNOSIS — M48061 Spinal stenosis, lumbar region without neurogenic claudication: Secondary | ICD-10-CM | POA: Diagnosis not present

## 2023-05-20 DIAGNOSIS — K5900 Constipation, unspecified: Secondary | ICD-10-CM | POA: Diagnosis not present

## 2023-05-20 DIAGNOSIS — H538 Other visual disturbances: Secondary | ICD-10-CM | POA: Diagnosis not present

## 2023-05-20 DIAGNOSIS — Z7901 Long term (current) use of anticoagulants: Secondary | ICD-10-CM | POA: Diagnosis not present

## 2023-05-20 DIAGNOSIS — K219 Gastro-esophageal reflux disease without esophagitis: Secondary | ICD-10-CM | POA: Diagnosis not present

## 2023-05-20 DIAGNOSIS — R296 Repeated falls: Secondary | ICD-10-CM | POA: Diagnosis not present

## 2023-05-20 DIAGNOSIS — I129 Hypertensive chronic kidney disease with stage 1 through stage 4 chronic kidney disease, or unspecified chronic kidney disease: Secondary | ICD-10-CM | POA: Diagnosis not present

## 2023-05-20 DIAGNOSIS — M170 Bilateral primary osteoarthritis of knee: Secondary | ICD-10-CM | POA: Diagnosis not present

## 2023-05-20 DIAGNOSIS — Z681 Body mass index (BMI) 19 or less, adult: Secondary | ICD-10-CM | POA: Diagnosis not present

## 2023-05-20 DIAGNOSIS — E559 Vitamin D deficiency, unspecified: Secondary | ICD-10-CM | POA: Diagnosis not present

## 2023-05-20 DIAGNOSIS — N183 Chronic kidney disease, stage 3 unspecified: Secondary | ICD-10-CM | POA: Diagnosis not present

## 2023-05-20 DIAGNOSIS — E872 Acidosis, unspecified: Secondary | ICD-10-CM | POA: Diagnosis not present

## 2023-05-20 DIAGNOSIS — R911 Solitary pulmonary nodule: Secondary | ICD-10-CM | POA: Diagnosis not present

## 2023-05-20 DIAGNOSIS — Z8673 Personal history of transient ischemic attack (TIA), and cerebral infarction without residual deficits: Secondary | ICD-10-CM | POA: Diagnosis not present

## 2023-05-20 DIAGNOSIS — R32 Unspecified urinary incontinence: Secondary | ICD-10-CM | POA: Diagnosis not present

## 2023-05-20 DIAGNOSIS — L539 Erythematous condition, unspecified: Secondary | ICD-10-CM | POA: Diagnosis not present

## 2023-05-20 DIAGNOSIS — Z86711 Personal history of pulmonary embolism: Secondary | ICD-10-CM | POA: Diagnosis not present

## 2023-05-20 DIAGNOSIS — M79645 Pain in left finger(s): Secondary | ICD-10-CM | POA: Diagnosis not present

## 2023-05-22 DIAGNOSIS — H34831 Tributary (branch) retinal vein occlusion, right eye, with macular edema: Secondary | ICD-10-CM | POA: Diagnosis not present

## 2023-05-22 NOTE — Telephone Encounter (Signed)
Patient called and informed that forms are ready for pick up. Copy made and placed in batch scanning. Original placed at front desk for pick up.   Devinn Hurwitz C Marjan Rosman, RN  

## 2023-05-26 ENCOUNTER — Other Ambulatory Visit: Payer: Self-pay | Admitting: Student

## 2023-05-26 DIAGNOSIS — L539 Erythematous condition, unspecified: Secondary | ICD-10-CM | POA: Diagnosis not present

## 2023-05-26 DIAGNOSIS — M170 Bilateral primary osteoarthritis of knee: Secondary | ICD-10-CM | POA: Diagnosis not present

## 2023-05-26 DIAGNOSIS — I129 Hypertensive chronic kidney disease with stage 1 through stage 4 chronic kidney disease, or unspecified chronic kidney disease: Secondary | ICD-10-CM | POA: Diagnosis not present

## 2023-05-26 DIAGNOSIS — M79645 Pain in left finger(s): Secondary | ICD-10-CM | POA: Diagnosis not present

## 2023-05-26 DIAGNOSIS — M6250 Muscle wasting and atrophy, not elsewhere classified, unspecified site: Secondary | ICD-10-CM | POA: Diagnosis not present

## 2023-05-26 DIAGNOSIS — J9601 Acute respiratory failure with hypoxia: Secondary | ICD-10-CM | POA: Diagnosis not present

## 2023-05-31 DIAGNOSIS — M79645 Pain in left finger(s): Secondary | ICD-10-CM | POA: Diagnosis not present

## 2023-05-31 DIAGNOSIS — L539 Erythematous condition, unspecified: Secondary | ICD-10-CM | POA: Diagnosis not present

## 2023-05-31 DIAGNOSIS — I129 Hypertensive chronic kidney disease with stage 1 through stage 4 chronic kidney disease, or unspecified chronic kidney disease: Secondary | ICD-10-CM | POA: Diagnosis not present

## 2023-05-31 DIAGNOSIS — M170 Bilateral primary osteoarthritis of knee: Secondary | ICD-10-CM | POA: Diagnosis not present

## 2023-05-31 DIAGNOSIS — M6250 Muscle wasting and atrophy, not elsewhere classified, unspecified site: Secondary | ICD-10-CM | POA: Diagnosis not present

## 2023-05-31 DIAGNOSIS — J9601 Acute respiratory failure with hypoxia: Secondary | ICD-10-CM | POA: Diagnosis not present

## 2023-06-01 DIAGNOSIS — M170 Bilateral primary osteoarthritis of knee: Secondary | ICD-10-CM | POA: Diagnosis not present

## 2023-06-01 DIAGNOSIS — J9601 Acute respiratory failure with hypoxia: Secondary | ICD-10-CM | POA: Diagnosis not present

## 2023-06-01 DIAGNOSIS — M6250 Muscle wasting and atrophy, not elsewhere classified, unspecified site: Secondary | ICD-10-CM | POA: Diagnosis not present

## 2023-06-01 DIAGNOSIS — L539 Erythematous condition, unspecified: Secondary | ICD-10-CM | POA: Diagnosis not present

## 2023-06-01 DIAGNOSIS — M79645 Pain in left finger(s): Secondary | ICD-10-CM | POA: Diagnosis not present

## 2023-06-01 DIAGNOSIS — I129 Hypertensive chronic kidney disease with stage 1 through stage 4 chronic kidney disease, or unspecified chronic kidney disease: Secondary | ICD-10-CM | POA: Diagnosis not present

## 2023-06-06 DIAGNOSIS — M79645 Pain in left finger(s): Secondary | ICD-10-CM | POA: Diagnosis not present

## 2023-06-06 DIAGNOSIS — I129 Hypertensive chronic kidney disease with stage 1 through stage 4 chronic kidney disease, or unspecified chronic kidney disease: Secondary | ICD-10-CM | POA: Diagnosis not present

## 2023-06-06 DIAGNOSIS — M6250 Muscle wasting and atrophy, not elsewhere classified, unspecified site: Secondary | ICD-10-CM | POA: Diagnosis not present

## 2023-06-06 DIAGNOSIS — L539 Erythematous condition, unspecified: Secondary | ICD-10-CM | POA: Diagnosis not present

## 2023-06-06 DIAGNOSIS — M170 Bilateral primary osteoarthritis of knee: Secondary | ICD-10-CM | POA: Diagnosis not present

## 2023-06-06 DIAGNOSIS — J9601 Acute respiratory failure with hypoxia: Secondary | ICD-10-CM | POA: Diagnosis not present

## 2023-06-12 DIAGNOSIS — M79645 Pain in left finger(s): Secondary | ICD-10-CM | POA: Diagnosis not present

## 2023-06-12 DIAGNOSIS — J9601 Acute respiratory failure with hypoxia: Secondary | ICD-10-CM | POA: Diagnosis not present

## 2023-06-12 DIAGNOSIS — I129 Hypertensive chronic kidney disease with stage 1 through stage 4 chronic kidney disease, or unspecified chronic kidney disease: Secondary | ICD-10-CM | POA: Diagnosis not present

## 2023-06-12 DIAGNOSIS — L539 Erythematous condition, unspecified: Secondary | ICD-10-CM | POA: Diagnosis not present

## 2023-06-12 DIAGNOSIS — M170 Bilateral primary osteoarthritis of knee: Secondary | ICD-10-CM | POA: Diagnosis not present

## 2023-06-12 DIAGNOSIS — M6250 Muscle wasting and atrophy, not elsewhere classified, unspecified site: Secondary | ICD-10-CM | POA: Diagnosis not present

## 2023-06-19 DIAGNOSIS — H409 Unspecified glaucoma: Secondary | ICD-10-CM | POA: Diagnosis not present

## 2023-06-19 DIAGNOSIS — L84 Corns and callosities: Secondary | ICD-10-CM | POA: Diagnosis not present

## 2023-06-19 DIAGNOSIS — M6250 Muscle wasting and atrophy, not elsewhere classified, unspecified site: Secondary | ICD-10-CM | POA: Diagnosis not present

## 2023-06-19 DIAGNOSIS — D751 Secondary polycythemia: Secondary | ICD-10-CM | POA: Diagnosis not present

## 2023-06-19 DIAGNOSIS — Z681 Body mass index (BMI) 19 or less, adult: Secondary | ICD-10-CM | POA: Diagnosis not present

## 2023-06-19 DIAGNOSIS — E559 Vitamin D deficiency, unspecified: Secondary | ICD-10-CM | POA: Diagnosis not present

## 2023-06-19 DIAGNOSIS — L603 Nail dystrophy: Secondary | ICD-10-CM | POA: Diagnosis not present

## 2023-06-19 DIAGNOSIS — L539 Erythematous condition, unspecified: Secondary | ICD-10-CM | POA: Diagnosis not present

## 2023-06-19 DIAGNOSIS — Z7901 Long term (current) use of anticoagulants: Secondary | ICD-10-CM | POA: Diagnosis not present

## 2023-06-19 DIAGNOSIS — N183 Chronic kidney disease, stage 3 unspecified: Secondary | ICD-10-CM | POA: Diagnosis not present

## 2023-06-19 DIAGNOSIS — Z8673 Personal history of transient ischemic attack (TIA), and cerebral infarction without residual deficits: Secondary | ICD-10-CM | POA: Diagnosis not present

## 2023-06-19 DIAGNOSIS — K5900 Constipation, unspecified: Secondary | ICD-10-CM | POA: Diagnosis not present

## 2023-06-19 DIAGNOSIS — I129 Hypertensive chronic kidney disease with stage 1 through stage 4 chronic kidney disease, or unspecified chronic kidney disease: Secondary | ICD-10-CM | POA: Diagnosis not present

## 2023-06-19 DIAGNOSIS — M79645 Pain in left finger(s): Secondary | ICD-10-CM | POA: Diagnosis not present

## 2023-06-19 DIAGNOSIS — M170 Bilateral primary osteoarthritis of knee: Secondary | ICD-10-CM | POA: Diagnosis not present

## 2023-06-19 DIAGNOSIS — N3281 Overactive bladder: Secondary | ICD-10-CM | POA: Diagnosis not present

## 2023-06-19 DIAGNOSIS — I739 Peripheral vascular disease, unspecified: Secondary | ICD-10-CM | POA: Diagnosis not present

## 2023-06-19 DIAGNOSIS — E872 Acidosis, unspecified: Secondary | ICD-10-CM | POA: Diagnosis not present

## 2023-06-19 DIAGNOSIS — J9601 Acute respiratory failure with hypoxia: Secondary | ICD-10-CM | POA: Diagnosis not present

## 2023-06-19 DIAGNOSIS — R296 Repeated falls: Secondary | ICD-10-CM | POA: Diagnosis not present

## 2023-06-19 DIAGNOSIS — M25512 Pain in left shoulder: Secondary | ICD-10-CM | POA: Diagnosis not present

## 2023-06-19 DIAGNOSIS — R634 Abnormal weight loss: Secondary | ICD-10-CM | POA: Diagnosis not present

## 2023-06-19 DIAGNOSIS — R32 Unspecified urinary incontinence: Secondary | ICD-10-CM | POA: Diagnosis not present

## 2023-06-19 DIAGNOSIS — R911 Solitary pulmonary nodule: Secondary | ICD-10-CM | POA: Diagnosis not present

## 2023-06-19 DIAGNOSIS — K219 Gastro-esophageal reflux disease without esophagitis: Secondary | ICD-10-CM | POA: Diagnosis not present

## 2023-06-19 DIAGNOSIS — Z86711 Personal history of pulmonary embolism: Secondary | ICD-10-CM | POA: Diagnosis not present

## 2023-06-19 DIAGNOSIS — M48061 Spinal stenosis, lumbar region without neurogenic claudication: Secondary | ICD-10-CM | POA: Diagnosis not present

## 2023-06-19 DIAGNOSIS — H538 Other visual disturbances: Secondary | ICD-10-CM | POA: Diagnosis not present

## 2023-06-20 DIAGNOSIS — I129 Hypertensive chronic kidney disease with stage 1 through stage 4 chronic kidney disease, or unspecified chronic kidney disease: Secondary | ICD-10-CM | POA: Diagnosis not present

## 2023-06-20 DIAGNOSIS — L539 Erythematous condition, unspecified: Secondary | ICD-10-CM | POA: Diagnosis not present

## 2023-06-20 DIAGNOSIS — M79645 Pain in left finger(s): Secondary | ICD-10-CM | POA: Diagnosis not present

## 2023-06-20 DIAGNOSIS — J9601 Acute respiratory failure with hypoxia: Secondary | ICD-10-CM | POA: Diagnosis not present

## 2023-06-20 DIAGNOSIS — M6250 Muscle wasting and atrophy, not elsewhere classified, unspecified site: Secondary | ICD-10-CM | POA: Diagnosis not present

## 2023-06-20 DIAGNOSIS — M170 Bilateral primary osteoarthritis of knee: Secondary | ICD-10-CM | POA: Diagnosis not present

## 2023-06-26 DIAGNOSIS — M79645 Pain in left finger(s): Secondary | ICD-10-CM | POA: Diagnosis not present

## 2023-06-26 DIAGNOSIS — I129 Hypertensive chronic kidney disease with stage 1 through stage 4 chronic kidney disease, or unspecified chronic kidney disease: Secondary | ICD-10-CM | POA: Diagnosis not present

## 2023-06-26 DIAGNOSIS — M170 Bilateral primary osteoarthritis of knee: Secondary | ICD-10-CM | POA: Diagnosis not present

## 2023-06-26 DIAGNOSIS — M6250 Muscle wasting and atrophy, not elsewhere classified, unspecified site: Secondary | ICD-10-CM | POA: Diagnosis not present

## 2023-06-26 DIAGNOSIS — L539 Erythematous condition, unspecified: Secondary | ICD-10-CM | POA: Diagnosis not present

## 2023-06-26 DIAGNOSIS — J9601 Acute respiratory failure with hypoxia: Secondary | ICD-10-CM | POA: Diagnosis not present

## 2023-07-03 DIAGNOSIS — H401132 Primary open-angle glaucoma, bilateral, moderate stage: Secondary | ICD-10-CM | POA: Diagnosis not present

## 2023-07-03 DIAGNOSIS — H35033 Hypertensive retinopathy, bilateral: Secondary | ICD-10-CM | POA: Diagnosis not present

## 2023-07-03 DIAGNOSIS — H43813 Vitreous degeneration, bilateral: Secondary | ICD-10-CM | POA: Diagnosis not present

## 2023-07-03 DIAGNOSIS — H34831 Tributary (branch) retinal vein occlusion, right eye, with macular edema: Secondary | ICD-10-CM | POA: Diagnosis not present

## 2023-07-03 DIAGNOSIS — H35372 Puckering of macula, left eye: Secondary | ICD-10-CM | POA: Diagnosis not present

## 2023-07-07 DIAGNOSIS — K219 Gastro-esophageal reflux disease without esophagitis: Secondary | ICD-10-CM | POA: Diagnosis not present

## 2023-07-07 DIAGNOSIS — K449 Diaphragmatic hernia without obstruction or gangrene: Secondary | ICD-10-CM | POA: Diagnosis not present

## 2023-07-07 DIAGNOSIS — R319 Hematuria, unspecified: Secondary | ICD-10-CM | POA: Diagnosis not present

## 2023-07-12 NOTE — Progress Notes (Deleted)
 Marland Kitchen

## 2023-07-13 ENCOUNTER — Emergency Department (HOSPITAL_COMMUNITY): Payer: Medicare Other

## 2023-07-13 ENCOUNTER — Ambulatory Visit: Payer: Medicare Other

## 2023-07-13 ENCOUNTER — Inpatient Hospital Stay (HOSPITAL_COMMUNITY)
Admission: EM | Admit: 2023-07-13 | Discharge: 2023-07-29 | DRG: 193 | Disposition: E | Payer: Medicare Other | Attending: Internal Medicine | Admitting: Internal Medicine

## 2023-07-13 ENCOUNTER — Encounter (HOSPITAL_COMMUNITY): Payer: Self-pay

## 2023-07-13 ENCOUNTER — Other Ambulatory Visit: Payer: Self-pay

## 2023-07-13 DIAGNOSIS — K5900 Constipation, unspecified: Secondary | ICD-10-CM | POA: Diagnosis present

## 2023-07-13 DIAGNOSIS — M79672 Pain in left foot: Secondary | ICD-10-CM | POA: Diagnosis not present

## 2023-07-13 DIAGNOSIS — Z9181 History of falling: Secondary | ICD-10-CM

## 2023-07-13 DIAGNOSIS — I700 Atherosclerosis of aorta: Secondary | ICD-10-CM | POA: Diagnosis not present

## 2023-07-13 DIAGNOSIS — J9601 Acute respiratory failure with hypoxia: Secondary | ICD-10-CM | POA: Diagnosis present

## 2023-07-13 DIAGNOSIS — Z66 Do not resuscitate: Secondary | ICD-10-CM | POA: Diagnosis present

## 2023-07-13 DIAGNOSIS — R64 Cachexia: Secondary | ICD-10-CM | POA: Diagnosis present

## 2023-07-13 DIAGNOSIS — I10 Essential (primary) hypertension: Secondary | ICD-10-CM | POA: Diagnosis not present

## 2023-07-13 DIAGNOSIS — R54 Age-related physical debility: Secondary | ICD-10-CM | POA: Diagnosis present

## 2023-07-13 DIAGNOSIS — W010XXA Fall on same level from slipping, tripping and stumbling without subsequent striking against object, initial encounter: Secondary | ICD-10-CM | POA: Diagnosis present

## 2023-07-13 DIAGNOSIS — J9811 Atelectasis: Secondary | ICD-10-CM | POA: Diagnosis present

## 2023-07-13 DIAGNOSIS — R1312 Dysphagia, oropharyngeal phase: Secondary | ICD-10-CM | POA: Diagnosis not present

## 2023-07-13 DIAGNOSIS — Y92009 Unspecified place in unspecified non-institutional (private) residence as the place of occurrence of the external cause: Secondary | ICD-10-CM | POA: Diagnosis not present

## 2023-07-13 DIAGNOSIS — J101 Influenza due to other identified influenza virus with other respiratory manifestations: Secondary | ICD-10-CM | POA: Diagnosis not present

## 2023-07-13 DIAGNOSIS — H43813 Vitreous degeneration, bilateral: Secondary | ICD-10-CM | POA: Insufficient documentation

## 2023-07-13 DIAGNOSIS — K449 Diaphragmatic hernia without obstruction or gangrene: Secondary | ICD-10-CM | POA: Diagnosis not present

## 2023-07-13 DIAGNOSIS — Z043 Encounter for examination and observation following other accident: Secondary | ICD-10-CM | POA: Diagnosis not present

## 2023-07-13 DIAGNOSIS — R1313 Dysphagia, pharyngeal phase: Secondary | ICD-10-CM | POA: Diagnosis not present

## 2023-07-13 DIAGNOSIS — H35033 Hypertensive retinopathy, bilateral: Secondary | ICD-10-CM | POA: Insufficient documentation

## 2023-07-13 DIAGNOSIS — M25512 Pain in left shoulder: Secondary | ICD-10-CM | POA: Diagnosis not present

## 2023-07-13 DIAGNOSIS — Z8261 Family history of arthritis: Secondary | ICD-10-CM

## 2023-07-13 DIAGNOSIS — Z7901 Long term (current) use of anticoagulants: Secondary | ICD-10-CM | POA: Diagnosis not present

## 2023-07-13 DIAGNOSIS — Z681 Body mass index (BMI) 19 or less, adult: Secondary | ICD-10-CM | POA: Diagnosis not present

## 2023-07-13 DIAGNOSIS — W19XXXA Unspecified fall, initial encounter: Secondary | ICD-10-CM

## 2023-07-13 DIAGNOSIS — E876 Hypokalemia: Secondary | ICD-10-CM | POA: Diagnosis present

## 2023-07-13 DIAGNOSIS — Z515 Encounter for palliative care: Secondary | ICD-10-CM | POA: Diagnosis not present

## 2023-07-13 DIAGNOSIS — Z8673 Personal history of transient ischemic attack (TIA), and cerebral infarction without residual deficits: Secondary | ICD-10-CM

## 2023-07-13 DIAGNOSIS — Z886 Allergy status to analgesic agent status: Secondary | ICD-10-CM

## 2023-07-13 DIAGNOSIS — E8721 Acute metabolic acidosis: Secondary | ICD-10-CM | POA: Diagnosis present

## 2023-07-13 DIAGNOSIS — H401132 Primary open-angle glaucoma, bilateral, moderate stage: Secondary | ICD-10-CM | POA: Insufficient documentation

## 2023-07-13 DIAGNOSIS — I129 Hypertensive chronic kidney disease with stage 1 through stage 4 chronic kidney disease, or unspecified chronic kidney disease: Secondary | ICD-10-CM | POA: Diagnosis present

## 2023-07-13 DIAGNOSIS — N1832 Acute kidney failure, unspecified: Secondary | ICD-10-CM | POA: Diagnosis present

## 2023-07-13 DIAGNOSIS — Z8711 Personal history of peptic ulcer disease: Secondary | ICD-10-CM

## 2023-07-13 DIAGNOSIS — J984 Other disorders of lung: Secondary | ICD-10-CM | POA: Diagnosis not present

## 2023-07-13 DIAGNOSIS — Z880 Allergy status to penicillin: Secondary | ICD-10-CM | POA: Diagnosis not present

## 2023-07-13 DIAGNOSIS — K633 Ulcer of intestine: Secondary | ICD-10-CM | POA: Diagnosis present

## 2023-07-13 DIAGNOSIS — M19012 Primary osteoarthritis, left shoulder: Secondary | ICD-10-CM | POA: Diagnosis not present

## 2023-07-13 DIAGNOSIS — Z86711 Personal history of pulmonary embolism: Secondary | ICD-10-CM | POA: Diagnosis present

## 2023-07-13 DIAGNOSIS — N179 Acute kidney failure, unspecified: Secondary | ICD-10-CM | POA: Diagnosis present

## 2023-07-13 DIAGNOSIS — H34831 Tributary (branch) retinal vein occlusion, right eye, with macular edema: Secondary | ICD-10-CM | POA: Insufficient documentation

## 2023-07-13 DIAGNOSIS — E870 Hyperosmolality and hypernatremia: Secondary | ICD-10-CM | POA: Diagnosis present

## 2023-07-13 DIAGNOSIS — Z79899 Other long term (current) drug therapy: Secondary | ICD-10-CM

## 2023-07-13 DIAGNOSIS — R55 Syncope and collapse: Secondary | ICD-10-CM | POA: Diagnosis present

## 2023-07-13 DIAGNOSIS — R0902 Hypoxemia: Secondary | ICD-10-CM | POA: Diagnosis not present

## 2023-07-13 DIAGNOSIS — R911 Solitary pulmonary nodule: Secondary | ICD-10-CM | POA: Diagnosis not present

## 2023-07-13 DIAGNOSIS — M25562 Pain in left knee: Secondary | ICD-10-CM | POA: Diagnosis not present

## 2023-07-13 DIAGNOSIS — Z7189 Other specified counseling: Secondary | ICD-10-CM | POA: Diagnosis not present

## 2023-07-13 DIAGNOSIS — Z4682 Encounter for fitting and adjustment of non-vascular catheter: Secondary | ICD-10-CM | POA: Diagnosis not present

## 2023-07-13 DIAGNOSIS — Z86718 Personal history of other venous thrombosis and embolism: Secondary | ICD-10-CM

## 2023-07-13 DIAGNOSIS — M6282 Rhabdomyolysis: Secondary | ICD-10-CM | POA: Diagnosis present

## 2023-07-13 DIAGNOSIS — R748 Abnormal levels of other serum enzymes: Secondary | ICD-10-CM | POA: Diagnosis present

## 2023-07-13 DIAGNOSIS — R296 Repeated falls: Secondary | ICD-10-CM

## 2023-07-13 DIAGNOSIS — E43 Unspecified severe protein-calorie malnutrition: Secondary | ICD-10-CM | POA: Insufficient documentation

## 2023-07-13 DIAGNOSIS — I517 Cardiomegaly: Secondary | ICD-10-CM | POA: Diagnosis not present

## 2023-07-13 DIAGNOSIS — Z8249 Family history of ischemic heart disease and other diseases of the circulatory system: Secondary | ICD-10-CM | POA: Diagnosis not present

## 2023-07-13 DIAGNOSIS — S199XXA Unspecified injury of neck, initial encounter: Secondary | ICD-10-CM | POA: Diagnosis not present

## 2023-07-13 DIAGNOSIS — S0990XA Unspecified injury of head, initial encounter: Secondary | ICD-10-CM | POA: Diagnosis not present

## 2023-07-13 DIAGNOSIS — E860 Dehydration: Secondary | ICD-10-CM | POA: Diagnosis present

## 2023-07-13 DIAGNOSIS — M85872 Other specified disorders of bone density and structure, left ankle and foot: Secondary | ICD-10-CM | POA: Diagnosis not present

## 2023-07-13 DIAGNOSIS — R918 Other nonspecific abnormal finding of lung field: Secondary | ICD-10-CM | POA: Diagnosis not present

## 2023-07-13 DIAGNOSIS — M19072 Primary osteoarthritis, left ankle and foot: Secondary | ICD-10-CM | POA: Diagnosis not present

## 2023-07-13 HISTORY — DX: Tributary (branch) retinal vein occlusion, right eye, with macular edema: H34.8310

## 2023-07-13 HISTORY — DX: Hypertensive retinopathy, bilateral: H35.033

## 2023-07-13 LAB — COMPREHENSIVE METABOLIC PANEL
ALT: 38 U/L (ref 0–44)
AST: 66 U/L — ABNORMAL HIGH (ref 15–41)
Albumin: 3.7 g/dL (ref 3.5–5.0)
Alkaline Phosphatase: 76 U/L (ref 38–126)
Anion gap: 14 (ref 5–15)
BUN: 26 mg/dL — ABNORMAL HIGH (ref 8–23)
CO2: 19 mmol/L — ABNORMAL LOW (ref 22–32)
Calcium: 9.3 mg/dL (ref 8.9–10.3)
Chloride: 104 mmol/L (ref 98–111)
Creatinine, Ser: 1.77 mg/dL — ABNORMAL HIGH (ref 0.44–1.00)
GFR, Estimated: 27 mL/min — ABNORMAL LOW (ref >60)
Glucose, Bld: 104 mg/dL — ABNORMAL HIGH (ref 70–99)
Potassium: 4.4 mmol/L (ref 3.5–5.1)
Sodium: 137 mmol/L (ref 135–145)
Total Bilirubin: 1 mg/dL (ref 0.0–1.2)
Total Protein: 7 g/dL (ref 6.5–8.1)

## 2023-07-13 LAB — CBC WITH DIFFERENTIAL/PLATELET
Abs Immature Granulocytes: 0.01 10*3/uL (ref 0.00–0.07)
Basophils Absolute: 0 10*3/uL (ref 0.0–0.1)
Basophils Relative: 0 %
Eosinophils Absolute: 0 10*3/uL (ref 0.0–0.5)
Eosinophils Relative: 0 %
HCT: 46 % (ref 36.0–46.0)
Hemoglobin: 14.5 g/dL (ref 12.0–15.0)
Immature Granulocytes: 0 %
Lymphocytes Relative: 21 %
Lymphs Abs: 0.8 10*3/uL (ref 0.7–4.0)
MCH: 29.1 pg (ref 26.0–34.0)
MCHC: 31.5 g/dL (ref 30.0–36.0)
MCV: 92.2 fL (ref 80.0–100.0)
Monocytes Absolute: 0.4 10*3/uL (ref 0.1–1.0)
Monocytes Relative: 11 %
Neutro Abs: 2.6 10*3/uL (ref 1.7–7.7)
Neutrophils Relative %: 68 %
Platelets: 170 10*3/uL (ref 150–400)
RBC: 4.99 MIL/uL (ref 3.87–5.11)
RDW: 16.1 % — ABNORMAL HIGH (ref 11.5–15.5)
WBC: 3.9 10*3/uL — ABNORMAL LOW (ref 4.0–10.5)
nRBC: 0 % (ref 0.0–0.2)

## 2023-07-13 LAB — RESP PANEL BY RT-PCR (RSV, FLU A&B, COVID)  RVPGX2
Influenza A by PCR: POSITIVE — AB
Influenza B by PCR: NEGATIVE
Resp Syncytial Virus by PCR: NEGATIVE
SARS Coronavirus 2 by RT PCR: NEGATIVE

## 2023-07-13 LAB — CK
Total CK: 709 U/L — ABNORMAL HIGH (ref 38–234)
Total CK: 968 U/L — ABNORMAL HIGH (ref 38–234)

## 2023-07-13 MED ORDER — BISACODYL 10 MG RE SUPP: 10.0000 mg | Freq: Every day | RECTAL | Status: DC | PRN

## 2023-07-13 MED ORDER — PANTOPRAZOLE SODIUM 40 MG PO TBEC
40.0000 mg | DELAYED_RELEASE_TABLET | Freq: Every day | ORAL | Status: DC
  Administered 2023-07-13 – 2023-07-16 (×4): 40 mg via ORAL
  Filled 2023-07-13 (×5): qty 1

## 2023-07-13 MED ORDER — AMLODIPINE BESYLATE 5 MG PO TABS
2.5000 mg | ORAL_TABLET | Freq: Every day | ORAL | Status: DC
  Administered 2023-07-13 – 2023-07-19 (×5): 2.5 mg via ORAL
  Filled 2023-07-13 (×5): qty 1

## 2023-07-13 MED ORDER — MIRABEGRON ER 25 MG PO TB24
25.0000 mg | ORAL_TABLET | Freq: Every morning | ORAL | Status: DC
  Administered 2023-07-14 – 2023-07-16 (×3): 25 mg via ORAL
  Filled 2023-07-13 (×6): qty 1

## 2023-07-13 MED ORDER — ONDANSETRON HCL 4 MG/2ML IJ SOLN
4.0000 mg | Freq: Four times a day (QID) | INTRAMUSCULAR | Status: DC | PRN
  Administered 2023-07-15: 4 mg via INTRAVENOUS
  Filled 2023-07-13: qty 2

## 2023-07-13 MED ORDER — POLYETHYLENE GLYCOL 3350 17 G PO PACK
17.0000 g | PACK | Freq: Every day | ORAL | Status: DC | PRN
  Administered 2023-07-15: 17 g via ORAL
  Filled 2023-07-13: qty 1

## 2023-07-13 MED ORDER — ACETAMINOPHEN 325 MG PO TABS
650.0000 mg | ORAL_TABLET | Freq: Four times a day (QID) | ORAL | Status: DC | PRN
  Administered 2023-07-14 – 2023-07-15 (×4): 650 mg via ORAL
  Filled 2023-07-13 (×4): qty 2

## 2023-07-13 MED ORDER — ONDANSETRON HCL 4 MG PO TABS: 4.0000 mg | ORAL_TABLET | Freq: Four times a day (QID) | ORAL | Status: DC | PRN

## 2023-07-13 MED ORDER — LACTATED RINGERS IV SOLN
INTRAVENOUS | Status: AC
Start: 2023-07-13 — End: 2023-07-14

## 2023-07-13 MED ORDER — LACTATED RINGERS IV BOLUS
1000.0000 mL | Freq: Once | INTRAVENOUS | Status: AC
  Administered 2023-07-13: 1000 mL via INTRAVENOUS

## 2023-07-13 MED ORDER — ACETAMINOPHEN 325 MG PO TABS
650.0000 mg | ORAL_TABLET | Freq: Once | ORAL | Status: AC
  Administered 2023-07-13: 650 mg via ORAL
  Filled 2023-07-13: qty 2

## 2023-07-13 MED ORDER — FESOTERODINE FUMARATE ER 4 MG PO TB24
4.0000 mg | ORAL_TABLET | Freq: Every day | ORAL | Status: DC
  Administered 2023-07-14 – 2023-07-16 (×3): 4 mg via ORAL
  Filled 2023-07-13 (×6): qty 1

## 2023-07-13 MED ORDER — OSELTAMIVIR PHOSPHATE 75 MG PO CAPS
75.0000 mg | ORAL_CAPSULE | Freq: Two times a day (BID) | ORAL | Status: DC
  Administered 2023-07-13: 75 mg via ORAL
  Filled 2023-07-13: qty 1

## 2023-07-13 MED ORDER — ACETAMINOPHEN 650 MG RE SUPP: 650.0000 mg | Freq: Four times a day (QID) | RECTAL | Status: DC | PRN

## 2023-07-13 MED ORDER — LACTATED RINGERS IV SOLN: INTRAVENOUS | Status: AC

## 2023-07-13 MED ORDER — OSELTAMIVIR PHOSPHATE 30 MG PO CAPS
30.0000 mg | ORAL_CAPSULE | Freq: Every day | ORAL | Status: AC
  Administered 2023-07-13 – 2023-07-16 (×4): 30 mg via ORAL
  Filled 2023-07-13 (×6): qty 1

## 2023-07-13 MED ORDER — ATORVASTATIN CALCIUM 40 MG PO TABS
40.0000 mg | ORAL_TABLET | Freq: Every day | ORAL | Status: DC
  Administered 2023-07-14: 40 mg via ORAL
  Filled 2023-07-13: qty 1

## 2023-07-13 NOTE — Assessment & Plan Note (Signed)
The patient was placed on eliquis for this. She was subsequently taken off of it due to hematuria.

## 2023-07-13 NOTE — ED Provider Notes (Signed)
[place] EMERGENCY DEPARTMENT AT [facility] Provider Note   CSN: [id number] Arrival date & time: 07/13/23  1003     History {Add pertinent medical, surgical, social history, OB history to HPI:1} Chief Complaint  Patient presents with   Becky Leach is a 89 y.o. female.  HPI     89 year old female with a history of DVT and PE on Eliquis, hypertension, hypertensive retinopathy, who presents with concern for fall.  Reports she was getting out of bed and tripped and fell very early this morning and was unable to get up.  She is not sure exactly how long she was on the floor for but reports that it was a while, at least several hours.  She denies any loss of consciousness, headache, neck pain, back pain, abdominal pain, chest pain, current shortness of breath, nausea, vomiting, numbness, weakness or fatigue.  Reports she does have left shoulder pain, and also notes pain to her left side.  Reports 1 about a week ago she did have some shortness of breath that improved.  Past Medical History:  Diagnosis Date   Acute cystitis 03/11/2023   Acute respiratory failure with hypoxia (HCC) 03/11/2023   AKI (acute kidney injury) (HCC)    due to NSAID   Arthritis    Closed fracture of left proximal humerus 09/14/2017   Constipation    DVT (deep venous thrombosis) (HCC)    Herniated disc    History of hemorrhoids 12/25/2017   History of pulmonary embolism 03/11/2023   Hypertension    Hypertensive retinopathy, bilateral 07/13/2023   Pulmonary embolism (HCC)    Seasonal allergies    Spinal stenosis    Stomach ulcer    due to NSAID   Tachycardia    TIA (transient ischemic attack) 09/09/2020   Tributary (branch) retinal vein occlusion, right eye, with macular edema 07/13/2023   UTI (lower urinary tract infection)    Vitamin D deficiency 09/14/2017   Weight loss 03/19/2021     Home Medications Prior to Admission medications   Medication Sig Start Date End Date  Taking? Authorizing Provider  acetaminophen (TYLENOL) 325 MG tablet Take 2 tablets (650 mg total) by mouth every 6 (six) hours as needed for mild pain (pain score 1-3) (or Fever >/= 101). 03/15/23   Sheikh, Omair Latif, DO  amLODipine (NORVASC) 2.5 MG tablet Take 1 tablet (2.5 mg total) by mouth daily. 03/15/23   Marguerita Merles Latif, DO  apixaban (ELIQUIS) 5 MG TABS tablet Take 2 tablets (10 mg total) by mouth 2 (two) times daily for 7 days, THEN 1 tablet (5 mg total) 2 (two) times daily. 03/15/23 04/21/23  Marguerita Merles Latif, DO  atorvastatin (LIPITOR) 40 MG tablet TAKE 1 TABLET BY MOUTH EVERY DAY 05/26/23   Erick Alley, DO  Cholecalciferol (VITAMIN D-3) 25 MCG (1000 UT) CAPS Take 1,000 Units by mouth daily.    [provider]  feeding supplement (ENSURE ENLIVE / ENSURE PLUS) LIQD Take 237 mLs by mouth 2 (two) times daily between meals. 03/15/23   Sheikh, Omair Latif, DO  latanoprost (XALATAN) 0.005 % ophthalmic solution Place 1 drop into both eyes at bedtime. 07/03/20   [provider]  loratadine (CLARITIN) 10 MG tablet Take 1 tablet (10 mg total) by mouth daily. 01/03/18   Mannam, Colbert Coyer, MD  MYRBETRIQ 50 MG TB24 tablet Take 50 mg by mouth in the morning. 11/22/18   [provider]  omeprazole (PRILOSEC) 40 MG capsule TAKE 1  CAPSULE (40 MG TOTAL) BY MOUTH DAILY. 03/10/23   Erick Alley, DO  ondansetron (ZOFRAN) 4 MG tablet Take 1 tablet (4 mg total) by mouth every 6 (six) hours as needed for nausea. Patient not taking: Reported on 05/05/2023 03/15/23   Marguerita Merles Latif, DO  senna-docusate (SENOKOT-S) 8.6-50 MG tablet Take 1 tablet by mouth at bedtime as needed for mild constipation. Patient not taking: Reported on 05/05/2023 03/15/23   Marguerita Merles Latif, DO  Trolamine Salicylate (ASPERCREME EX) Apply 1 application  topically as needed (for pain). Patient not taking: Reported on 05/05/2023    [provider]  trospium (SANCTURA) 20 MG tablet Take 20 mg by mouth  at bedtime.    [provider]      Allergies    Nsaids, Penicillin g, and Penicillins    Review of Systems   Review of Systems  Physical Exam Updated Vital Signs BP (!) 142/98 Comment: manual  Pulse (!) 101   Temp 98.3 F (36.8 C)   Resp 18   Ht 5\' 5"  (1.651 m)   Wt 47.2 kg   SpO2 98%   BMI 17.31 kg/m  Physical Exam Vitals and nursing note reviewed.  Constitutional:      General: She is not in acute distress.    Appearance: She is well-developed. She is not diaphoretic.  HENT:     Head: Normocephalic and atraumatic.  Eyes:     Conjunctiva/sclera: Conjunctivae normal.  Cardiovascular:     Rate and Rhythm: Normal rate and regular rhythm.     Heart sounds: Normal heart sounds. No murmur heard.    No friction rub. No gallop.  Pulmonary:     Effort: Pulmonary effort is normal. No respiratory distress.     Breath sounds: Normal breath sounds. No wheezing or rales.  Chest:     Chest wall: Tenderness (posterior rib tenderness on left) present.  Abdominal:     General: There is no distension.     Palpations: Abdomen is soft.     Tenderness: There is no abdominal tenderness. There is no guarding.  Musculoskeletal:        General: Tenderness (left shoulder) present.     Cervical back: Normal range of motion.  Skin:    General: Skin is warm and dry.     Findings: No erythema or rash.  Neurological:     Mental Status: She is alert and oriented to person, place, and time.     Sensory: No sensory deficit.     Motor: No weakness.     ED Results / Procedures / Treatments   Labs (all labs ordered are listed, but only abnormal results are displayed) Labs Reviewed  RESP PANEL BY RT-PCR (RSV, FLU A&B, COVID)  [id number]  CBC WITH DIFFERENTIAL/PLATELET  COMPREHENSIVE METABOLIC PANEL  CK  URINALYSIS, W/ REFLEX TO CULTURE (INFECTION SUSPECTED)    EKG None  Radiology No results found.  Procedures Procedures  {Document cardiac monitor, telemetry assessment  procedure when appropriate:1}  Medications Ordered in ED Medications - No data to display  ED Course/ Medical Decision Making/ A&P   {   Click here for ABCD2, HEART and other calculatorsREFRESH Note before signing :1}                                 89 year old female with a history of DVT and PE on Eliquis, hypertension, hypertensive retinopathy, who presents with concern for fall.  She arrives as a level 2 trauma due to fall on blood thinners.  Chest x-ray was completed and was personally eval and interpreted by me and showed no evidence of pneumothorax, no visible rib fractures.   EKG evaluated by me shows normal sinus rhythm without acute change compared to prior.  Given age, fall on anticoagulation and tenderness over left ribs, CT head, cervical spine and chest were completed and personally evaluated by me and radiology and showed ***  Labs completed given fall with inability to stand up independently and to screen for rhabdomyolysis show creatinine of 1.7 from 1.3 previously, normal sodium, CK of 709.  Mild elevation in CK however not 5 times upper limit of normal or at level to suggest rhabdomyolysis, Cr mildly elevated from October. UA shows ***  X-ray of the left shoulder shows no acute fracture or dislocation.  CT head, cervical spine and chest show no traumatic abnormalities.  She is noted to have irregular atelectasis/consolidation in the right lower lobe which looks increase in size compared to prior and recommend follow-up chest CT in 3 to 6 months.  She has hiatal hernia.  {Document critical care time when appropriate:1} {Document review of labs and clinical decision tools ie heart score, Chads2Vasc2 etc:1}  {Document your independent review of radiology images, and any outside records:1} {Document your discussion with family members, caretakers, and with consultants:1} {Document social determinants of health affecting pt's care:1} {Document your decision making why or  why not admission, treatments were needed:1} Final Clinical Impression(s) / ED Diagnoses Final diagnoses:  None    Rx / DC Orders ED Discharge Orders     None

## 2023-07-13 NOTE — H&P (Signed)
History and Physical    Patient: Becky Leach ZOX:[id number] DOB: June 15, 1929 DOA: 07/13/2023 DOS: the patient was seen and examined on 07/13/2023 PCP: Erick Alley, DO  Patient coming from: Home  Chief Complaint:  Chief Complaint  Patient presents with   Fall   HPI: Becky Leach is a 89 y.o. female with medical history significant of spinal stenosis, unprovoked PE/DVT, hematuria (taken off of AC), arthritis, constipation, herniated disc, hypertension, stomach ulcer, vitamin D deficiency, UTI. She was found on the floor this morning lying up against the bed.   In the ED she was found to be positive for influenza A, acute on CKD 3b, elevated CK. CT head and C-spine were negative for acute pathology. X-ray of the left foot, left knee, and left shoulder were negative for acute fracture and dislocation. CXR was negative for acute abnormality. She had a low grade fever.  The patient is alone in the ED bay. She is a poor historian. She has a severe cough. She states that she wasn't coughing until she came to the hospital.  She will be admitted to observation status. She will be given Tamiflu and IV fluids. She will be placed on droplet precautions. Tamiflu will be renally adjusted.  Review of Systems: As mentioned in the history of present illness. All other systems reviewed and are negative. Past Medical History:  Diagnosis Date   Acute cystitis 03/11/2023   Acute respiratory failure with hypoxia (HCC) 03/11/2023   AKI (acute kidney injury) (HCC)    due to NSAID   Arthritis    Closed fracture of left proximal humerus 09/14/2017   Constipation    DVT (deep venous thrombosis) (HCC)    Herniated disc    History of hemorrhoids 12/25/2017   History of pulmonary embolism 03/11/2023   Hypertension    Hypertensive retinopathy, bilateral 07/13/2023   Pulmonary embolism (HCC)    Seasonal allergies    Spinal stenosis    Stomach ulcer    due to NSAID   Tachycardia    TIA (transient ischemic  attack) 09/09/2020   Tributary (branch) retinal vein occlusion, right eye, with macular edema 07/13/2023   UTI (lower urinary tract infection)    Vitamin D deficiency 09/14/2017   Weight loss 03/19/2021   Past Surgical History:  Procedure Laterality Date   COLONOSCOPY  12/21/2011   Procedure: COLONOSCOPY;  Surgeon: [name], MD;  Location: [facility];  Service: Endoscopy;  Laterality: N/A;   DILATION AND CURETTAGE OF UTERUS     ORIF HUMERUS FRACTURE Left 09/12/2017   Procedure: OPEN REDUCTION INTERNAL FIXATION (ORIF) PROXIMAL  HUMERUS FRACTURE;  Surgeon: Myrene Galas, MD;  Location: [facility] OR;  Service: Orthopedics;  Laterality: Left;   TONSILLECTOMY AND ADENOIDECTOMY     Social History:  reports that she has never smoked. She has never used smokeless tobacco. She reports that she does not drink alcohol and does not use drugs.  Allergies  Allergen Reactions   Nsaids Other (See Comments)    "NSAIDS WILL CAUSE BLEEDING ULCERS"- do NOT give   Penicillin G Rash   Penicillins Rash and Other (See Comments)    Cephalosporin tolerant given during surgery 08/2017    Family History  Problem Relation Age of Onset   Arthritis Mother    Heart attack Mother     Prior to Admission medications   Medication Sig Start Date End Date Taking? Authorizing Provider  acetaminophen (TYLENOL) 325 MG tablet Take 2 tablets (650 mg total) by mouth every  6 (six) hours as needed for mild pain (pain score 1-3) (or Fever >/= 101). 03/15/23   Sheikh, Omair Latif, DO  amLODipine (NORVASC) 2.5 MG tablet Take 1 tablet (2.5 mg total) by mouth daily. 03/15/23   Marguerita Merles Latif, DO  apixaban (ELIQUIS) 5 MG TABS tablet Take 2 tablets (10 mg total) by mouth 2 (two) times daily for 7 days, THEN 1 tablet (5 mg total) 2 (two) times daily. 03/15/23 07/13/23  Marguerita Merles Latif, DO  atorvastatin (LIPITOR) 40 MG tablet TAKE 1 TABLET BY MOUTH EVERY DAY 05/26/23   Erick Alley, DO  Cholecalciferol (VITAMIN D-3) 25 MCG  (1000 UT) CAPS Take 1,000 Units by mouth daily.    [provider]  feeding supplement (ENSURE ENLIVE / ENSURE PLUS) LIQD Take 237 mLs by mouth 2 (two) times daily between meals. 03/15/23   Sheikh, Omair Latif, DO  latanoprost (XALATAN) 0.005 % ophthalmic solution Place 1 drop into both eyes at bedtime. 07/03/20   [provider]  loratadine (CLARITIN) 10 MG tablet Take 1 tablet (10 mg total) by mouth daily. 01/03/18   Mannam, Colbert Coyer, MD  MYRBETRIQ 50 MG TB24 tablet Take 50 mg by mouth in the morning. 11/22/18   [provider]  omeprazole (PRILOSEC) 40 MG capsule TAKE 1 CAPSULE (40 MG TOTAL) BY MOUTH DAILY. 03/10/23   Erick Alley, DO  ondansetron (ZOFRAN) 4 MG tablet Take 1 tablet (4 mg total) by mouth every 6 (six) hours as needed for nausea. Patient not taking: Reported on 05/05/2023 03/15/23   Marguerita Merles Latif, DO  senna-docusate (SENOKOT-S) 8.6-50 MG tablet Take 1 tablet by mouth at bedtime as needed for mild constipation. Patient not taking: Reported on 05/05/2023 03/15/23   Marguerita Merles Latif, DO  Trolamine Salicylate (ASPERCREME EX) Apply 1 application  topically as needed (for pain). Patient not taking: Reported on 05/05/2023    [provider]  trospium (SANCTURA) 20 MG tablet Take 20 mg by mouth at bedtime.    [provider]    Physical Exam: Vitals:   07/13/23 1630 07/13/23 1645 07/13/23 1730 07/13/23 1757  BP: (!) 128/98 (!) 136/99 (!) 158/104   Pulse: 92 91 85   Resp: 18 14 18    Temp:    97.7 F (36.5 C)  TempSrc:    Oral  SpO2: 99% 100% 96%   Weight:      Height:       Exam:  Constitutional:  The patient is awake, alert, and oriented x 3. No acute distress. Respiratory:  Diminished breath sounds. Rhonchi and mild wheezes throughout. No tactile fremitus Cardiovascular:  Regular rate and rhythm No murmurs, ectopy, or gallups. No lateral PMI. No thrills. Abdomen:  Abdomen is soft, non-tender, non-distended No hernias,  masses, or organomegaly Normoactive bowel sounds.  Musculoskeletal:  No cyanosis, clubbing, or edema Cachectic Skin:  No rashes, lesions, ulcers palpation of skin: no induration or nodules Tenting of skin Neurologic:  The patient is very weak and is unable to cooperate with exam. Psychiatric:  The patient is very weak and is unable to cooperate with exam.  Data Reviewed:  CBC, BMP, CK, Cepheid results  Assessment and Plan: Frequent falls Patient with history of frequent falls. Pt found on floor leaning against the bed this morning.  History of pulmonary embolus (PE) The patient was placed on eliquis for this. She was subsequently taken off of it due to hematuria.  Acute renal failure superimposed on stage 3b chronic kidney disease (HCC) The patient  has acute on chronic kidney disease, likely prerenal. Nephrotoxins will be avoided. Medications will be renally dosed. Creatinine and electrolytes will be monitored. Volume status will be monitored. She will receive cautious IV fluids.  Fall at home, initial encounter Pt has a history of frequent falls, but she was found on the floor this morning leaning up against her bed.  Rhabdomyolysis CK of 968. At least partly responsible for AKI. Cautious IV fluids.  Influenza A H1N1 infection The patient was started on renally dosed tamiflu.   I have seen and examined this patient myself. I have spent 48 minutes in her evaluation and care.    Advance Care Planning:   Code Status: Full Code   Consults: None  Family Communication: None available  Severity of Illness: The appropriate patient status for this patient is OBSERVATION. Observation status is judged to be reasonable and necessary in order to provide the required intensity of service to ensure the patient's safety. The patient's presenting symptoms, physical exam findings, and initial radiographic and laboratory data in the context of their medical condition is felt to place  them at decreased risk for further clinical deterioration. Furthermore, it is anticipated that the patient will be medically stable for discharge from the hospital within 2 midnights of admission.   Author: Babe Clenney, DO 07/13/2023 6:10 PM  For on call review [email or web address].

## 2023-07-13 NOTE — Assessment & Plan Note (Signed)
CK of 968. At least partly responsible for AKI. Cautious IV fluids.

## 2023-07-13 NOTE — ED Notes (Signed)
Transported to CT with TRN.

## 2023-07-13 NOTE — Assessment & Plan Note (Signed)
The patient was started on renally dosed tamiflu.

## 2023-07-13 NOTE — Progress Notes (Signed)
Transition of Care [facility]) - CAGE-AID Screening   Patient Details  Name: Becky Leach MRN: [id number] Date of Birth: [birth year]  Hewitt Shorts, RN Trauma Response Nurse Phone Number: [phone] 07/13/2023, 6:40 PM     CAGE-AID Screening:    Have You Ever Felt You Ought to Cut Down on Your Drinking or Drug Use?: No Have People Annoyed You By Critizing Your Drinking Or Drug Use?: No Have You Felt Bad Or Guilty About Your Drinking Or Drug Use?: No Have You Ever Had a Drink or Used Drugs First Thing In The Morning to Steady Your Nerves or to Get Rid of a Hangover?: No CAGE-AID Score: 0  Substance Abuse Education Offered: (S) No (no services needed)

## 2023-07-13 NOTE — Assessment & Plan Note (Signed)
The patient has acute on chronic kidney disease, likely prerenal. Nephrotoxins will be avoided. Medications will be renally dosed. Creatinine and electrolytes will be monitored. Volume status will be monitored. She will receive cautious IV fluids.

## 2023-07-13 NOTE — Progress Notes (Signed)
Orthopedic Tech Progress Note Patient Details:  TAHRA HITZEMAN 06-16-1929 [id number] Level 2 Trauma. Not needed Patient ID: DESTIN KITTLER, female   DOB: 1929-07-22, 89 y.o.   MRN: [id number]  Lovett Calender 07/13/2023, 10:08 AM

## 2023-07-13 NOTE — ED Notes (Incomplete)
Trauma Response Nurse Documentation   PHYLICIA MCGAUGH is a 89 y.o. female arriving to [facility] ED via [facility]  On Eliquis (apixaban) daily. Trauma was activated as a Level 2 by Charge RN based on the following trauma criteria Elderly patients > 65 with head trauma on anti-coagulation (excluding ASA).  Patient cleared for CT by Dr. Dalene Seltzer. Pt transported to CT with trauma response nurse present to monitor. RN remained with the patient throughout their absence from the department for clinical observation.   GCS 15.  History   Past Medical History:  Diagnosis Date   Acute cystitis 03/11/2023   Acute respiratory failure with hypoxia (HCC) 03/11/2023   AKI (acute kidney injury) (HCC)    due to NSAID   Arthritis    Closed fracture of left proximal humerus 09/14/2017   Constipation    DVT (deep venous thrombosis) (HCC)    Herniated disc    History of hemorrhoids 12/25/2017   History of pulmonary embolism 03/11/2023   Hypertension    Hypertensive retinopathy, bilateral 07/13/2023   Pulmonary embolism (HCC)    Seasonal allergies    Spinal stenosis    Stomach ulcer    due to NSAID   Tachycardia    TIA (transient ischemic attack) 09/09/2020   Tributary (branch) retinal vein occlusion, right eye, with macular edema 07/13/2023   UTI (lower urinary tract infection)    Vitamin D deficiency 09/14/2017   Weight loss 03/19/2021     Past Surgical History:  Procedure Laterality Date   COLONOSCOPY  12/21/2011   Procedure: COLONOSCOPY;  Surgeon: [name], MD;  Location: [facility];  Service: Endoscopy;  Laterality: N/A;   DILATION AND CURETTAGE OF UTERUS     ORIF HUMERUS FRACTURE Left 09/12/2017   Procedure: OPEN REDUCTION INTERNAL FIXATION (ORIF) PROXIMAL  HUMERUS FRACTURE;  Surgeon: Myrene Galas, MD;  Location: [facility] OR;  Service: Orthopedics;  Laterality: Left;   TONSILLECTOMY AND ADENOIDECTOMY         Initial Focused Assessment (If applicable, or please see trauma  documentation): Airway - Clear Breathing - unlabored Circulation - 2+ radial pulses. No external bleeding noted. Does have a hematoma below left eye.  GCS - 15  CT's Completed:   CT Head and CT C-Spine   Interventions:   Labs Xrays CT scans Family at bedside   Plan for disposition:  {Trauma Dispo:26867}   Consults completed:  {Trauma Consults:26862} at ***.  Event Summary: To ED via [facility] from home- lives alone and has a niece that lives very close and comes over daily. Niece found pt sitting on the floor, and she was unable to get up by herself. Small hematoma below left eye. Skin tear noted on right arm. C/o left shoulder pain, had ORIF in 2019 for humerus fx. No obvious dislocation or swelling at this time.   Niece- Rene Kocher is at bedside  Hewitt Shorts  Trauma Response RN  Please call TRN at [phone] for further assistance.

## 2023-07-13 NOTE — ED Notes (Signed)
Please call Becky Leach and/or Becky Leach with any updates to plan of care, admission or DC status.  Cell and home numbers are in contacts.

## 2023-07-13 NOTE — Progress Notes (Signed)
   07/13/23 1000  Spiritual Encounters  Type of Visit Initial  Care provided to: Pt not available  Conversation partners present during encounter Nurse  Referral source Trauma page  Reason for visit Trauma  OnCall Visit No    Chaplain responded to Level II page. Pt being cared for by medical team - didn't clearly answer if she knew where she was/what day it is. Met with front desk security to inform them that 2 family members have been cleared to visit pt per RN.

## 2023-07-13 NOTE — ED Notes (Signed)
Pt ambulated with assistance. Pt stated that she uses a walker normally. Pt with a shuffling gait. Pt placed back into bed with clean depends.

## 2023-07-13 NOTE — Assessment & Plan Note (Signed)
Patient with history of frequent falls. Pt found on floor leaning against the bed this morning.

## 2023-07-13 NOTE — ED Triage Notes (Signed)
Family went to check on patient t his morning and found her on the floor sitting up leaning against bed.  Hematoma to head and complains of left posterior shoulder pain. Skin tear to right arm. Patient is on eliquis

## 2023-07-13 NOTE — Assessment & Plan Note (Signed)
Pt has a history of frequent falls, but she was found on the floor this morning leaning up against her bed.

## 2023-07-13 NOTE — ED Provider Notes (Signed)
Patient with fall/syncope today.  Lives at home by herself.  Has a home health aide.  She is found this morning lying up against the bed.  She is on blood thinners.  She is positive for influenza.  Traumatic scans are unremarkable.  She has a low-grade fever.  Her CK is mildly elevated.  She looks clinically dehydrated.  She is a mild AKI.  Ultimately she uses a walker at home.  She is very frail and weak and I think she is pretty symptomatic from the flu and would benefit from admission for hydration PT OT Tamiflu and some supportive care and hopefully we can get her discharge back home.  I talked with the son on the phone who is agreeable to this plan.  Will admit to the hospitalist for further care.  This chart was dictated using voice recognition software.  Despite best efforts to proofread,  errors can occur which can change the documentation meaning.    Virgina Norfolk, DO 07/13/23 1721

## 2023-07-14 DIAGNOSIS — Z4682 Encounter for fitting and adjustment of non-vascular catheter: Secondary | ICD-10-CM | POA: Diagnosis not present

## 2023-07-14 DIAGNOSIS — Y92009 Unspecified place in unspecified non-institutional (private) residence as the place of occurrence of the external cause: Secondary | ICD-10-CM | POA: Diagnosis not present

## 2023-07-14 DIAGNOSIS — W010XXA Fall on same level from slipping, tripping and stumbling without subsequent striking against object, initial encounter: Secondary | ICD-10-CM | POA: Diagnosis present

## 2023-07-14 DIAGNOSIS — Z886 Allergy status to analgesic agent status: Secondary | ICD-10-CM | POA: Diagnosis not present

## 2023-07-14 DIAGNOSIS — Z681 Body mass index (BMI) 19 or less, adult: Secondary | ICD-10-CM | POA: Diagnosis not present

## 2023-07-14 DIAGNOSIS — J9601 Acute respiratory failure with hypoxia: Secondary | ICD-10-CM | POA: Diagnosis present

## 2023-07-14 DIAGNOSIS — R64 Cachexia: Secondary | ICD-10-CM | POA: Diagnosis present

## 2023-07-14 DIAGNOSIS — R0902 Hypoxemia: Secondary | ICD-10-CM | POA: Diagnosis not present

## 2023-07-14 DIAGNOSIS — K633 Ulcer of intestine: Secondary | ICD-10-CM | POA: Diagnosis present

## 2023-07-14 DIAGNOSIS — Z8261 Family history of arthritis: Secondary | ICD-10-CM | POA: Diagnosis not present

## 2023-07-14 DIAGNOSIS — Z7901 Long term (current) use of anticoagulants: Secondary | ICD-10-CM | POA: Diagnosis not present

## 2023-07-14 DIAGNOSIS — E870 Hyperosmolality and hypernatremia: Secondary | ICD-10-CM | POA: Diagnosis present

## 2023-07-14 DIAGNOSIS — N1832 Chronic kidney disease, stage 3b: Secondary | ICD-10-CM | POA: Diagnosis present

## 2023-07-14 DIAGNOSIS — K449 Diaphragmatic hernia without obstruction or gangrene: Secondary | ICD-10-CM | POA: Diagnosis present

## 2023-07-14 DIAGNOSIS — Z515 Encounter for palliative care: Secondary | ICD-10-CM | POA: Diagnosis not present

## 2023-07-14 DIAGNOSIS — J9811 Atelectasis: Secondary | ICD-10-CM | POA: Diagnosis present

## 2023-07-14 DIAGNOSIS — R918 Other nonspecific abnormal finding of lung field: Secondary | ICD-10-CM | POA: Diagnosis not present

## 2023-07-14 DIAGNOSIS — R1312 Dysphagia, oropharyngeal phase: Secondary | ICD-10-CM | POA: Diagnosis present

## 2023-07-14 DIAGNOSIS — N179 Acute kidney failure, unspecified: Secondary | ICD-10-CM | POA: Diagnosis present

## 2023-07-14 DIAGNOSIS — I129 Hypertensive chronic kidney disease with stage 1 through stage 4 chronic kidney disease, or unspecified chronic kidney disease: Secondary | ICD-10-CM | POA: Diagnosis present

## 2023-07-14 DIAGNOSIS — Z880 Allergy status to penicillin: Secondary | ICD-10-CM | POA: Diagnosis not present

## 2023-07-14 DIAGNOSIS — E8721 Acute metabolic acidosis: Secondary | ICD-10-CM | POA: Diagnosis present

## 2023-07-14 DIAGNOSIS — Z66 Do not resuscitate: Secondary | ICD-10-CM | POA: Diagnosis present

## 2023-07-14 DIAGNOSIS — J984 Other disorders of lung: Secondary | ICD-10-CM | POA: Diagnosis not present

## 2023-07-14 DIAGNOSIS — Z7189 Other specified counseling: Secondary | ICD-10-CM | POA: Diagnosis not present

## 2023-07-14 DIAGNOSIS — R1313 Dysphagia, pharyngeal phase: Secondary | ICD-10-CM | POA: Diagnosis not present

## 2023-07-14 DIAGNOSIS — E876 Hypokalemia: Secondary | ICD-10-CM | POA: Diagnosis present

## 2023-07-14 DIAGNOSIS — E860 Dehydration: Secondary | ICD-10-CM | POA: Diagnosis present

## 2023-07-14 DIAGNOSIS — Z8249 Family history of ischemic heart disease and other diseases of the circulatory system: Secondary | ICD-10-CM | POA: Diagnosis not present

## 2023-07-14 DIAGNOSIS — J101 Influenza due to other identified influenza virus with other respiratory manifestations: Secondary | ICD-10-CM | POA: Diagnosis present

## 2023-07-14 DIAGNOSIS — M6282 Rhabdomyolysis: Secondary | ICD-10-CM | POA: Diagnosis present

## 2023-07-14 DIAGNOSIS — R296 Repeated falls: Secondary | ICD-10-CM | POA: Diagnosis present

## 2023-07-14 LAB — RESPIRATORY PANEL BY PCR

## 2023-07-14 LAB — COMPREHENSIVE METABOLIC PANEL
ALT: 42 U/L (ref 0–44)
AST: 79 U/L — ABNORMAL HIGH (ref 15–41)
Albumin: 3.4 g/dL — ABNORMAL LOW (ref 3.5–5.0)
Alkaline Phosphatase: 70 U/L (ref 38–126)
Anion gap: 11 (ref 5–15)
BUN: 20 mg/dL (ref 8–23)
CO2: 25 mmol/L (ref 22–32)
Calcium: 9.2 mg/dL (ref 8.9–10.3)
Chloride: 101 mmol/L (ref 98–111)
Creatinine, Ser: 1.19 mg/dL — ABNORMAL HIGH (ref 0.44–1.00)
GFR, Estimated: 43 mL/min — ABNORMAL LOW (ref >60)
Glucose, Bld: 88 mg/dL (ref 70–99)
Potassium: 3.8 mmol/L (ref 3.5–5.1)
Sodium: 137 mmol/L (ref 135–145)
Total Bilirubin: 1.4 mg/dL — ABNORMAL HIGH (ref 0.0–1.2)
Total Protein: 6.8 g/dL (ref 6.5–8.1)

## 2023-07-14 LAB — CBC
HCT: 45.9 % (ref 36.0–46.0)
Hemoglobin: 15.6 g/dL — ABNORMAL HIGH (ref 12.0–15.0)
MCH: 28.8 pg (ref 26.0–34.0)
MCHC: 34 g/dL (ref 30.0–36.0)
MCV: 84.7 fL (ref 80.0–100.0)
Platelets: 179 10*3/uL (ref 150–400)
RBC: 5.42 MIL/uL — ABNORMAL HIGH (ref 3.87–5.11)
RDW: 15.6 % — ABNORMAL HIGH (ref 11.5–15.5)
WBC: 6.1 10*3/uL (ref 4.0–10.5)
nRBC: 0 % (ref 0.0–0.2)

## 2023-07-14 LAB — CK: Total CK: 1188 U/L — ABNORMAL HIGH (ref 38–234)

## 2023-07-14 MED ORDER — APIXABAN 2.5 MG PO TABS
2.5000 mg | ORAL_TABLET | Freq: Two times a day (BID) | ORAL | Status: DC
  Administered 2023-07-14 – 2023-07-15 (×3): 2.5 mg via ORAL
  Filled 2023-07-14 (×3): qty 1

## 2023-07-14 NOTE — Care Management Obs Status (Signed)
MEDICARE OBSERVATION STATUS NOTIFICATION   Patient Details  Name: Becky Leach MRN: [id number] Date of Birth: 1929/11/21   Medicare Observation Status Notification Given:       Harriet Masson, RN 07/14/2023, 1:42 PM

## 2023-07-14 NOTE — TOC Initial Note (Addendum)
Transition of Care [facility]) - Initial/Assessment Note    Patient Details  Name: Becky Leach MRN: [id number] Date of Birth: 05/15/1930  Transition [facility]) CM/SW Contact:    Harriet Masson, RN Phone Number: 07/14/2023, 2:26 PM  Clinical Narrative:                 Spoke to patient regarding transition needs.  Patient lives alone and has aide 7days week/4hrs a day.  Patient uses a walker and has son and niece that checks on her. Patient States niece can increase the amount of time she assists her.  Niece transports her to apts.  Patient is agreeable to home health and has used [facility] in the past.  Haywood Lasso with [facility] accepted referral for PT, OT RN. Need home health pt, ot, rn orders.  Address, Phone number and PCP verified. TOC following.   12 Son stated patient's niece can not help out more therefore CSW is going to speak to patient regarding SNF.  Expected Discharge Plan: Home w Home Health Services Barriers to Discharge: Continued Medical Work up   Patient Goals and CMS Choicea Patient states their goals for this hospitalization and ongoing recovery are:: return home CMS Medicare.gov Compare Post Acute Care list provided to:: Patient Choice offered to / list presented to : Patient      Expected Discharge Plan and Services   Discharge Planning Services: CM Consult Post Acute Care Choice: Home Health Living arrangements for the past 2 months: Single Family Home                           HH Arranged: PT, OT, RN [facility] Agency: [facility] Date [facility] Agency Contacted: 07/14/23 Time HH Agency Contacted: 1425 Representative spoke with at [facility] Agency: Haywood Lasso  Prior Living Arrangements/Services Living arrangements for the past 2 months: Single Family Home Lives with:: Self (has aide 7days week/4hrs a day) Patient language and need for interpreter reviewed:: Yes Do you feel safe going back to the place where you live?: Yes      Need for Family Participation in  Patient Care: Yes (Comment) Care giver support system in place?: Yes (comment) Current home services: DME (walker, BSC) Criminal Activity/Legal Involvement Pertinent to Current Situation/Hospitalization: No - Comment as needed  Activities of Daily Living   ADL Screening (condition at time of admission) Independently performs ADLs?: No Does the patient have a NEW difficulty with bathing/dressing/toileting/self-feeding that is expected to last >3 days?: Yes (Initiates electronic notice to provider for possible OT consult) Does the patient have a NEW difficulty with getting in/out of bed, walking, or climbing stairs that is expected to last >3 days?: Yes (Initiates electronic notice to provider for possible PT consult) Does the patient have a NEW difficulty with communication that is expected to last >3 days?: No Is the patient deaf or have difficulty hearing?: Yes Does the patient have difficulty seeing, even when wearing glasses/contacts?: Yes Does the patient have difficulty concentrating, remembering, or making decisions?: Yes  Permission Sought/Granted Permission sought to share information with : Photographer granted to share info w AGENCY: HH        Emotional Assessment Appearance:: Appears stated age Attitude/Demeanor/Rapport: Engaged Affect (typically observed): Accepting Orientation: : Oriented to Self, Oriented to Place, Oriented to  Time, Oriented to Situation Alcohol / Substance Use: Not Applicable Psych Involvement: No (comment)  Admission diagnosis:  Influenza A [  J10.1] Syncope, unspecified syncope type [R55] Patient Active Problem List   Diagnosis Date Noted   Tributary (branch) retinal vein occlusion, right eye, with macular edema 07/13/2023   Hypertensive retinopathy, bilateral 07/13/2023   Primary open-angle glaucoma, bilateral, moderate stage 07/13/2023   Vitreous degeneration, bilateral 07/13/2023   Frailty 07/13/2023    Influenza A H1N1 infection 07/13/2023   Rhabdomyolysis 07/13/2023   Underweight (BMI < 18.5) 05/16/2023   Goals of care, counseling/discussion 05/06/2023   Thumb pain, left 05/06/2023   Acute deep vein thrombosis (DVT) of proximal vein of right lower extremity (HCC) 03/12/2023   Large Hiatal hernia with GERD and LLL compressive atelectais 03/11/2023   Acute pulmonary embolism (HCC) 03/11/2023   Lung nodule, right upper lobe measuring 2.9 x 2.1 cm. 03/11/2023   Hematuria 03/04/2023   Reflex neuropathic bladder, not elsewhere classified 01/25/2023   Overactive bladder 03/19/2021   CKD (chronic kidney disease) stage 3, GFR 30-59 ml/min (HCC) 07/10/2020   Vitamin D deficiency 09/14/2017   Fall at home, initial encounter 09/12/2017   At risk for hemorrhage associated with anticoagulation therapy 09/12/2017   Frequent falls 09/12/2017   History of pulmonary embolus (PE) 05/11/2017   Impaired ambulation 01/12/2017   Bilateral primary osteoarthritis of knee 12/26/2016   Spinal stenosis of lumbar region 11/14/2013   Acute renal failure superimposed on stage 3b chronic kidney disease (HCC) 02/27/2012   Benign essential HTN 12/06/2011   PCP:  Erick Alley, DO Pharmacy:   [facility] [phone] Ginette Otto, [place] - [address] [place] [address] Phone: [phone] Fax: [phone]     Social Drivers of Health (SDOH) Social History: SDOH Screenings   Food Insecurity: No Food Insecurity (07/13/2023)  Housing: Low Risk  (07/13/2023)  Transportation Needs: No Transportation Needs (07/13/2023)  Utilities: Not At Risk (07/13/2023)  Alcohol Screen: Low Risk  (03/24/2023)  Depression (PHQ2-9): Medium Risk (05/05/2023)  Financial Resource Strain: Low Risk  (03/24/2023)  Physical Activity: Inactive (03/24/2023)  Social Connections: Unknown (07/13/2023)  Stress: No Stress Concern Present (03/24/2023)  Tobacco Use: Low Risk  (07/13/2023)  Health Literacy: Adequate  Health Literacy (03/24/2023)   SDOH Interventions:     Readmission Risk Interventions     No data to display

## 2023-07-14 NOTE — Evaluation (Signed)
Physical Therapy Evaluation Patient Details Name: Becky Leach MRN: [id number] DOB: 1929-06-16 Today's Date: 07/14/2023  History of Present Illness  The pt is a 89 yo female presenting 2/13 after being found on the floor of he room with hematoma to her head and a skin tear on her R arm. Work up revealed influenza A, no acute bony injury. PMH includes: spinal stenosis, unprovoked PE/DVT, hematuria (taken off of AC), arthritis, constipation, herniated disc, hypertension, stomach ulcer, vitamin D deficiency, UTI.   Clinical Impression  Pt in bed upon arrival of PT, agreeable to evaluation at this time. Prior to admission the pt was living at home alone with her niece visiting in the morning for breakfast and an aide from 12-2:30 daily. She uses a RW to ambulate in her home, and is able to toilet independently, but dependent on others for bathing, dressing, and food preparation. She reports no other falls, but reports limited additional support from family. The pt was able to complete initial sit-stand transfer with minA and minA to pivot to [facility], then was able to complete sit-stand and short bouts of ambulation in the room with minA-CGA and use of RW. The pt reports she would need to be able to walk ~30 ft at a time to manage in her home, and would need to be able to complete bed mobility without assistance. She was unable to do that during my session today, and therefore will continue to benefit from skilled PT acutely to progress independence and strength, but would need increased assist available at home after d/c. If family is unable to provide increased assistance for bed mobility and supervision for ambulation for short term after d/c, the pt may need short stint of rehab to return to maximal level of independence.     If plan is discharge home, recommend the following: A little help with walking and/or transfers;A little help with bathing/dressing/bathroom;Assistance with cooking/housework;Direct  supervision/assist for medications management;Direct supervision/assist for financial management   Can travel by private vehicle   Yes    Equipment Recommendations None recommended by PT (pt has needed DME)  Recommendations for Other Services       Functional Status Assessment Patient has had a recent decline in their functional status and demonstrates the ability to make significant improvements in function in a reasonable and predictable amount of time.     Precautions / Restrictions Precautions Precautions: Fall Recall of Precautions/Restrictions: Intact Restrictions Weight Bearing Restrictions Per Provider Order: No      Mobility  Bed Mobility Overal bed mobility: Needs Assistance Bed Mobility: Supine to Sit     Supine to sit: Min assist, HOB elevated, Used rails     General bed mobility comments: increased time    Transfers Overall transfer level: Needs assistance Equipment used: 1 person hand held assist, Rolling walker (2 wheels) Transfers: Sit to/from Stand, Bed to chair/wheelchair/BSC Sit to Stand: Min assist, Contact guard assist   Step pivot transfers: Min assist       General transfer comment: initially minA to rise to standing and minA to transfer to [facility] with repeated cues for hand placement. Pt then given RW and able to complete with minA-CGA with RW    Ambulation/Gait Ambulation/Gait assistance: Contact guard assist Gait Distance (Feet): 4 Feet (+ 25 ft) Assistive device: Rolling walker (2 wheels) Gait Pattern/deviations: Step-through pattern, Decreased stride length, Shuffle, Trunk flexed, Narrow base of support Gait velocity: decreased Gait velocity interpretation: <1.31 ft/sec, indicative of household ambulator   General  Gait Details: small shuffling steps with head and trunk flexed. slow but stable with BUE support     Balance Overall balance assessment: Needs assistance Sitting-balance support: No upper extremity supported, Feet  supported Sitting balance-Leahy Scale: Fair     Standing balance support: Bilateral upper extremity supported, During functional activity, Reliant on assistive device for balance Standing balance-Leahy Scale: Poor Standing balance comment: dependent on BUE support                             Pertinent Vitals/Pain Pain Assessment Pain Assessment: No/denies pain    Home Living Family/patient expects to be discharged to:: Private residence Living Arrangements: Alone Available Help at Discharge: Family;Available PRN/intermittently (pt reports aide from 12-2:30 daily and neice comes in morning for breakfast, not sure that anyone could come more often) Type of Home: House Home Access: Level entry       Home Layout: One level Home Equipment: Agricultural consultant (2 wheels);Shower seat Additional Comments: had lived with son, but around oct 2024 he was admitted to hospital because "he couldnt walk" and has not returned home    Prior Function Prior Level of Function : Needs assist       Physical Assist : ADLs (physical)   ADLs (physical): Grooming;Bathing;Dressing;IADLs Mobility Comments: walks with RW, states she can get into and out of bed on her own, no other falls ADLs Comments: pt reports aide helps her get in shower and bathe, neice helps her dress, aide or family do IADLs     Extremity/Trunk Assessment   Upper Extremity Assessment Upper Extremity Assessment: Defer to OT evaluation    Lower Extremity Assessment Lower Extremity Assessment: Generalized weakness    Cervical / Trunk Assessment Cervical / Trunk Assessment: Kyphotic;Other exceptions Cervical / Trunk Exceptions: frail  Communication   Communication Communication: No apparent difficulties    Cognition Arousal: Alert Behavior During Therapy: WFL for tasks assessed/performed   PT - Cognitive impairments: Orientation, Memory   Orientation impairments: Time                     Following  commands: Intact       Cueing Cueing Techniques: Verbal cues     General Comments General comments (skin integrity, edema, etc.): VSS on RA        Assessment/Plan    PT Assessment Patient needs continued PT services  PT Problem List Decreased strength;Decreased activity tolerance;Decreased balance;Decreased mobility       PT Treatment Interventions DME instruction;Gait training;Functional mobility training;Therapeutic activities;Therapeutic exercise;Balance training;Patient/family education    PT Goals (Current goals can be found in the Care Plan section)  Acute Rehab PT Goals Patient Stated Goal: to return home PT Goal Formulation: With patient Time For Goal Achievement: 07/28/23 Potential to Achieve Goals: Fair    Frequency Min 1X/week        AM-PAC PT "6 Clicks" Mobility  Outcome Measure Help needed turning from your back to your side while in a flat bed without using bedrails?: A Little Help needed moving from lying on your back to sitting on the side of a flat bed without using bedrails?: A Little Help needed moving to and from a bed to a chair (including a wheelchair)?: A Little Help needed standing up from a chair using your arms (e.g., wheelchair or bedside chair)?: A Little Help needed to walk in hospital room?: A Little Help needed climbing 3-5 steps with a railing? : A  Lot 6 Click Score: 17    End of Session Equipment Utilized During Treatment: Gait belt Activity Tolerance: Patient limited by fatigue Patient left: in chair;with call bell/phone within reach;with chair alarm set Nurse Communication: Mobility status PT Visit Diagnosis: Unsteadiness on feet (R26.81);Other abnormalities of gait and mobility (R26.89);Muscle weakness (generalized) (M62.81)    Time: 4098-1191 PT Time Calculation (min) (ACUTE ONLY): 39 min   Charges:   PT Evaluation $PT Eval Moderate Complexity: 1 Mod PT Treatments $Therapeutic Exercise: 8-22 mins $Therapeutic Activity:  8-22 mins PT General Charges $$ ACUTE PT VISIT: 1 Visit         Vickki Muff, PT, DPT   Acute Rehabilitation Department Office [phone] Secure Chat Communication Preferred  Ronnie Derby 07/14/2023, 1:27 PM

## 2023-07-14 NOTE — TOC Progression Note (Signed)
Transition of Care [facility]) - Progression Note    Patient Details  Name: Becky Leach MRN: [id number] Date of Birth: [birth year]  [facility]) CM/SW Contact  Marliss Coots, LCSW Phone Number: 07/14/2023, 4:18 PM  Clinical Narrative:     4:18 PM Per medical team, physical therapy recommended SNF due to lack of support at home. CSW introduced herself and role to patient at bedside. CSW informed patient of therapy recommendation of discharge to SNF. Patient consented to recommendation upon consent of son, Becky Leach. CSW offered to call Becky Leach with patient on speaker. Patient consented to this and called [name] with CSW. Becky Leach expressed agreeance with therapy recommendation. Becky Leach expressed interest in patient discharge to [facility].  Expected Discharge Plan: Skilled Nursing Facility Barriers to Discharge: Continued Medical Work up  Expected Discharge Plan and Services   Discharge Planning Services: CM Consult Post Acute Care Choice: Home Health Living arrangements for the past 2 months: Single Family Home                           HH Arranged: PT, OT, RN [facility] Agency: [facility] Date [facility] Agency Contacted: 07/14/23 Time HH Agency Contacted: 1425 Representative spoke with at [facility] Agency: Haywood Lasso   Social Determinants of Health (SDOH) Interventions SDOH Screenings   Food Insecurity: No Food Insecurity (07/13/2023)  Housing: Low Risk  (07/13/2023)  Transportation Needs: No Transportation Needs (07/13/2023)  Utilities: Not At Risk (07/13/2023)  Alcohol Screen: Low Risk  (03/24/2023)  Depression (PHQ2-9): Medium Risk (05/05/2023)  Financial Resource Strain: Low Risk  (03/24/2023)  Physical Activity: Inactive (03/24/2023)  Social Connections: Unknown (07/13/2023)  Stress: No Stress Concern Present (03/24/2023)  Tobacco Use: Low Risk  (07/13/2023)  Health Literacy: Adequate Health Literacy (03/24/2023)    Readmission Risk Interventions     No data to display

## 2023-07-14 NOTE — Evaluation (Addendum)
 Occupational Therapy Evaluation Patient Details Name: Becky Leach MRN: [id number] DOB: 01-Jul-1929 Today's Date: 07/14/2023   History of Present Illness   The pt is a 89 yo female presenting 2/13 after being found on the floor of he room with hematoma to her head and a skin tear on her R arm. Work up revealed influenza A, no acute bony injury. PMH includes: spinal stenosis, unprovoked PE/DVT, hematuria (taken off of AC), arthritis, constipation, herniated disc, hypertension, stomach ulcer, vitamin D deficiency, UTI.     Clinical Impressions PTA pt lives at home and has assistance form her family during the daytime hours, including 2 hours of a PCA from 12-2. At baseline, pt uses a BSC at night by her bed and ambulates to the bathroom during the day. Pt states her PCA assists with ADL/IADL tasks as needed. Son reports 2 recent falls. Pt with overall deconditioning and is a high risk for falls. At this time Patient will benefit from continued inpatient follow up therapy, <3 hours/day to maximize functional level of independence. Acute OT to follow.     If plan is discharge home, recommend the following:   A little help with walking and/or transfers;A little help with bathing/dressing/bathroom;Assistance with cooking/housework;Direct supervision/assist for medications management;Direct supervision/assist for financial management;Assist for transportation;Help with stairs or ramp for entrance     Functional Status Assessment   Patient has had a recent decline in their functional status and demonstrates the ability to make significant improvements in function in a reasonable and predictable amount of time.     Equipment Recommendations   None recommended by OT     Recommendations for Other Services         Precautions/Restrictions   Precautions Precautions: Fall Recall of Precautions/Restrictions: Intact Restrictions Weight Bearing Restrictions Per Provider Order: No      Mobility Bed Mobility Overal bed mobility: Needs Assistance Bed Mobility: Supine to Sit, Sit to Supine     Supine to sit: HOB elevated, Used rails, Supervision Sit to supine: Supervision   General bed mobility comments: increased time    Transfers Overall transfer level: Needs assistance Equipment used: 1 person hand held assist, Rolling walker (2 wheels) Transfers: Sit to/from Stand, Bed to chair/wheelchair/BSC Sit to Stand: Supervision     Step pivot transfers: Supervision            Balance Overall balance assessment: Needs assistance Sitting-balance support: No upper extremity supported, Feet supported Sitting balance-Leahy Scale: Fair     Standing balance support: Bilateral upper extremity supported, During functional activity, Reliant on assistive device for balance Standing balance-Leahy Scale: Poor                             ADL either performed or assessed with clinical judgement   ADL Overall ADL's : Needs assistance/impaired     Grooming: Set up   Upper Body Bathing: Set up;Sitting   Lower Body Bathing: Minimal assistance;Sit to/from stand   Upper Body Dressing : Set up;Sitting   Lower Body Dressing: Minimal assistance;Sit to/from stand   Toilet Transfer: Contact guard assist;Ambulation   Toileting- Clothing Manipulation and Hygiene: Minimal assistance;Sit to/from stand       Functional mobility during ADLs: Contact guard assist;Rolling walker (2 wheels)       Vision Baseline Vision/History: 0 No visual deficits; wears glasses - not in room       Perception         Praxis  Pertinent Vitals/Pain Pain Assessment Pain Assessment: Faces Faces Pain Scale: Hurts a little bit Pain Location: L hip Pain Descriptors / Indicators: Discomfort Pain Intervention(s): Limited activity within patient's tolerance     Extremity/Trunk Assessment Upper Extremity Assessment Upper Extremity Assessment: Generalized weakness    Lower Extremity Assessment Lower Extremity Assessment: Defer to PT evaluation   Cervical / Trunk Assessment Cervical / Trunk Assessment: Kyphotic;Other exceptions Cervical / Trunk Exceptions: frail   Communication Communication Communication: No apparent difficulties   Cognition Arousal: Alert Behavior During Therapy: WFL for tasks assessed/performed Cognition: No family/caregiver present to determine baseline (most likly at baseline)                               Following commands: Intact       Cueing  General Comments   Cueing Techniques: Verbal cues      Exercises     Shoulder Instructions      Home Living Family/patient expects to be discharged to:: Private residence Living Arrangements: Alone Available Help at Discharge: Family;Available PRN/intermittently (pt reports aide from 12-2:30 daily and neice comes in morning for breakfast, not sure that anyone could come more often) Type of Home: House Home Access: Level entry     Home Layout: One level     Bathroom Shower/Tub: Chief Strategy Officer: Standard Bathroom Accessibility: No   Home Equipment: Agricultural consultant (2 wheels);Shower seat   Additional Comments: had lived with son, but around oct 2024 he was admitted to hospital because "he couldnt walk" and has not returned home      Prior Functioning/Environment Prior Level of Function : Needs assist       Physical Assist : ADLs (physical)   ADLs (physical): Grooming;Bathing;Dressing;IADLs Mobility Comments: walks with RW, states she can get into and out of bed on her own, no other falls ADLs Comments: pt reports aide helps her get in shower and bathe, neice helps her dress, aide or family do IADLs    OT Problem List: Decreased strength;Decreased activity tolerance;Impaired balance (sitting and/or standing);Decreased safety awareness   OT Treatment/Interventions: Self-care/ADL training;Therapeutic exercise;Energy  conservation;DME and/or AE instruction;Therapeutic activities;Patient/family education;Balance training      OT Goals(Current goals can be found in the care plan section)   Acute Rehab OT Goals Patient Stated Goal: to get better OT Goal Formulation: With patient Time For Goal Achievement: 07/28/23 Potential to Achieve Goals: Good   OT Frequency:  Min 1X/week    Co-evaluation              AM-PAC OT "6 Clicks" Daily Activity     Outcome Measure Help from another person eating meals?: None Help from another person taking care of personal grooming?: A Little Help from another person toileting, which includes using toliet, bedpan, or urinal?: A Little Help from another person bathing (including washing, rinsing, drying)?: A Little Help from another person to put on and taking off regular upper body clothing?: A Little Help from another person to put on and taking off regular lower body clothing?: A Little 6 Click Score: 19   End of Session Equipment Utilized During Treatment: Gait belt;Rolling walker (2 wheels) Nurse Communication: Mobility status;Other (comment) (DC needs)  Activity Tolerance: Patient tolerated treatment well Patient left: in bed;with call bell/phone within reach;with bed alarm set  OT Visit Diagnosis: Unsteadiness on feet (R26.81);Muscle weakness (generalized) (M62.81);History of falling (Z91.81)  Time: 0454-0981 OT Time Calculation (min): 30 min Charges:  OT General Charges $OT Visit: 1 Visit OT Evaluation $OT Eval Low Complexity: 1 Low OT Treatments $Self Care/Home Management : 8-22 mins  Luisa Dago, OT/L   Acute OT Clinical Specialist Acute Rehabilitation Services Pager [phone] Office [phone]   [facility] [place] 07/14/2023, 4:11 PM

## 2023-07-14 NOTE — Progress Notes (Signed)
  Progress Note   Patient: Becky Leach JYN:[id number] DOB: February 14, 1930 DOA: 07/13/2023     0 DOS: the patient was seen and examined on 07/14/2023   Brief hospital course:  Becky Leach is a 89 y.o. female with medical history significant of spinal stenosis, unprovoked PE/DVT, hematuria (taken off of AC), arthritis, constipation, herniated disc, hypertension, stomach ulcer, vitamin D deficiency, UTI. She was found on the floor this morning lying up against the bed.   She was admitted to a medical bed. She will be given Tamiflu and IV fluids. She is on droplet precautions. Tamiflu wasrenally adjusted.   The patient is sitting up at bedside today. She states that she does not feel well.  Assessment and Plan: * Influenza A H1N1 infection The patient was started on renally dosed tamiflu. She states that she does not feel well, but is unable to be more specific.   Frequent falls Patient with history of frequent falls. Pt found on floor leaning against the bed this morning. She will need PT/OT eval prior to discharge.  History of pulmonary embolus (PE) The patient was placed on eliquis for this. She was subsequently taken off of it due to hematuria, but was on it again at the time of presentation.  Rhabdomyolysis CK of 968 it has increased to 1188 even with IV fluids. Will continue to support patient with cautious fluids. This is at least partly responsible for AKI.   Fall at home, initial encounter Pt has a history of frequent falls, but she was found on the floor this morning leaning up against her bed.  Acute renal failure superimposed on stage 3b chronic kidney disease (HCC) The patient has acute on chronic kidney disease, likely prerenal and due to mild rhabdomyolysis. Nephrotoxins will be avoided. Medications will be renally dosed. Creatinine and electrolytes will be monitored. Volume status will be monitored. She will receive cautious IV fluids.      Subjective: The patient is sitting  up at bedside. She states that she is not feeling well.  Physical Exam: Vitals:   07/14/23 0005 07/14/23 0005 07/14/23 0740 07/14/23 1559  BP:  (!) 150/100 (!) 138/92 139/87  Pulse:  92 93 93  Resp:  18 16 16   Temp: 98 F (36.7 C)  97.6 F (36.4 C) 97.7 F (36.5 C)  TempSrc: Oral  Oral Oral  SpO2:  90% 92% 92%  Weight:      Height:       Exam:  Constitutional:  The patient is awake, alert, and oriented x 3. No acute distress. Respiratory:  No increased work of breathing. No rales or rhonchi. Mild wheezes No tactile fremitus Cardiovascular:  Regular rate and rhythm No murmurs, ectopy, or gallups. No lateral PMI. No thrills. Abdomen:  Abdomen is soft, non-tender, non-distended No hernias, masses, or organomegaly Normoactive bowel sounds.  Musculoskeletal:  No cyanosis, clubbing, or edema Skin:  No rashes, lesions, ulcers palpation of skin: no induration or nodules Neurologic:  CN 2-12 intact Sensation all 4 extremities intact Psychiatric:  Mental status Mood, affect appropriate Orientation to person, place, time  judgment and insight appear intact  Family Communication: None available  Disposition: Status is: Inpatient Remains inpatient appropriate because: Due to risk of transformation into severe illness.  Planned Discharge Destination: Home    Time spent: 32 minutes  Author: Fran Lowes, DO 07/14/2023 6:33 PM  For on call review [email or web address].

## 2023-07-14 NOTE — [place] FL2 (Signed)
[place] MEDICAID FL2 LEVEL OF CARE FORM     IDENTIFICATION  Patient Name: Becky Leach Birthdate: [birth year] Sex: female Admission Date (Current Location): 07/13/2023  [facility] and [place] Number:  Producer, television/film/video and Address:  The . [facility], [address] [place], [place] [address]      Provider Number: [id number]  Attending Physician Name and Address:  Fran Lowes, DO  Relative Name and Phone Number:  Latania Bascomb; Son; [phone]    Current Level of Care: Hospital Recommended Level of Care: Skilled Nursing Facility Prior Approval Number:    Date Approved/Denied:   PASRR Number: [id number]  Discharge Plan: SNF    Current Diagnoses: Patient Active Problem List   Diagnosis Date Noted   Influenza A 07/14/2023   Tributary (branch) retinal vein occlusion, right eye, with macular edema 07/13/2023   Hypertensive retinopathy, bilateral 07/13/2023   Primary open-angle glaucoma, bilateral, moderate stage 07/13/2023   Vitreous degeneration, bilateral 07/13/2023   Frailty 07/13/2023   Influenza A H1N1 infection 07/13/2023   Rhabdomyolysis 07/13/2023   Underweight (BMI < 18.5) 05/16/2023   Goals of care, counseling/discussion 05/06/2023   Thumb pain, left 05/06/2023   Acute deep vein thrombosis (DVT) of proximal vein of right lower extremity (HCC) 03/12/2023   Large Hiatal hernia with GERD and LLL compressive atelectais 03/11/2023   Acute pulmonary embolism (HCC) 03/11/2023   Lung nodule, right upper lobe measuring 2.9 x 2.1 cm. 03/11/2023   Hematuria 03/04/2023   Reflex neuropathic bladder, not elsewhere classified 01/25/2023   Overactive bladder 03/19/2021   CKD (chronic kidney disease) stage 3, GFR 30-59 ml/min (HCC) 07/10/2020   Vitamin D deficiency 09/14/2017   Fall at home, initial encounter 09/12/2017   At risk for hemorrhage associated with anticoagulation therapy 09/12/2017   Frequent falls 09/12/2017   History of pulmonary embolus  (PE) 05/11/2017   Impaired ambulation 01/12/2017   Bilateral primary osteoarthritis of knee 12/26/2016   Spinal stenosis of lumbar region 11/14/2013   Acute renal failure superimposed on stage 3b chronic kidney disease (HCC) 02/27/2012   Benign essential HTN 12/06/2011    Orientation RESPIRATION BLADDER Height & Weight     Self, Situation, Time, Place  Normal (Room Air) Continent Weight: 104 lb (47.2 kg) Height:  5\' 5"  (165.1 cm)  BEHAVIORAL SYMPTOMS/MOOD NEUROLOGICAL BOWEL NUTRITION STATUS      Continent Diet (Please see dc summary)  AMBULATORY STATUS COMMUNICATION OF NEEDS Skin   Limited Assist Verbally Normal                       Personal Care Assistance Level of Assistance  Bathing, Feeding, Dressing Bathing Assistance: Limited assistance Feeding assistance: Limited assistance Dressing Assistance: Limited assistance     Functional Limitations Info             SPECIAL CARE FACTORS FREQUENCY  PT (By licensed PT), OT (By licensed OT)     PT Frequency: 5x OT Frequency: 5x            Contractures Contractures Info: Not present    Additional Factors Info  Code Status, Allergies, Isolation Precautions Code Status Info: Full Code Allergies Info: Nsaids; Penicillin G; Penicillins     Isolation Precautions Info: Droplet Precautions (Influenca A H1N1 Infection)     Current Medications (07/14/2023):  This is the current hospital active medication list Current Facility-Administered Medications  Medication Dose Route Frequency Provider Last Rate Last Admin   acetaminophen (TYLENOL) tablet 650 mg  650 mg Oral Q6H PRN Swayze, Ava, DO   650 mg at 07/14/23 0509   Or   acetaminophen (TYLENOL) suppository 650 mg  650 mg Rectal Q6H PRN Swayze, Ava, DO       amLODipine (NORVASC) tablet 2.5 mg  2.5 mg Oral Daily Swayze, Ava, DO   2.5 mg at 07/14/23 1053   apixaban (ELIQUIS) tablet 2.5 mg  2.5 mg Oral BID Swayze, Ava, DO   2.5 mg at 07/14/23 1455   atorvastatin  (LIPITOR) tablet 40 mg  40 mg Oral Daily Swayze, Ava, DO   40 mg at 07/14/23 1053   bisacodyl (DULCOLAX) suppository 10 mg  10 mg Rectal Daily PRN Swayze, Ava, DO       fesoterodine (TOVIAZ) tablet 4 mg  4 mg Oral Daily Swayze, Ava, DO   4 mg at 07/14/23 1053   lactated ringers infusion   Intravenous Continuous Curatolo, Adam, DO       lactated ringers infusion   Intravenous Continuous Swayze, Ava, DO 75 mL/hr at 07/13/23 1749 New Bag at 07/13/23 1749   mirabegron ER (MYRBETRIQ) tablet 25 mg  25 mg Oral q AM Swayze, Ava, DO   25 mg at 07/14/23 1053   ondansetron (ZOFRAN) tablet 4 mg  4 mg Oral Q6H PRN Swayze, Ava, DO       Or   ondansetron (ZOFRAN) injection 4 mg  4 mg Intravenous Q6H PRN Swayze, Ava, DO       oseltamivir (TAMIFLU) capsule 30 mg  30 mg Oral Daily Swayze, Ava, DO   30 mg at 07/14/23 1053   pantoprazole (PROTONIX) EC tablet 40 mg  40 mg Oral Daily Swayze, Ava, DO   40 mg at 07/14/23 1053   polyethylene glycol (MIRALAX / GLYCOLAX) packet 17 g  17 g Oral Daily PRN Swayze, Ava, DO         Discharge Medications: Please see discharge summary for a list of discharge medications.  Relevant Imaging Results:  Relevant Lab Results:   Additional Information SSN [id number]  Marliss Coots, LCSW

## 2023-07-14 NOTE — Plan of Care (Signed)
Problem: Education: Goal: Knowledge of General Education information will improve Description Including pain rating scale, medication(s)/side effects and non-pharmacologic comfort measures Outcome: Progressing   Problem: Health Behavior/Discharge Planning: Goal: Ability to manage health-related needs will improve Outcome: Progressing

## 2023-07-15 DIAGNOSIS — J101 Influenza due to other identified influenza virus with other respiratory manifestations: Secondary | ICD-10-CM | POA: Diagnosis not present

## 2023-07-15 LAB — CBC WITH DIFFERENTIAL/PLATELET
Abs Immature Granulocytes: 0.01 10*3/uL (ref 0.00–0.07)
Basophils Absolute: 0 10*3/uL (ref 0.0–0.1)
Basophils Relative: 1 %
Eosinophils Absolute: 0 10*3/uL (ref 0.0–0.5)
Eosinophils Relative: 0 %
HCT: 42 % (ref 36.0–46.0)
Hemoglobin: 14.3 g/dL (ref 12.0–15.0)
Immature Granulocytes: 0 %
Lymphocytes Relative: 17 %
Lymphs Abs: 1 10*3/uL (ref 0.7–4.0)
MCH: 28.3 pg (ref 26.0–34.0)
MCHC: 34 g/dL (ref 30.0–36.0)
MCV: 83.2 fL (ref 80.0–100.0)
Monocytes Absolute: 0.6 10*3/uL (ref 0.1–1.0)
Monocytes Relative: 10 %
Neutro Abs: 4.5 10*3/uL (ref 1.7–7.7)
Neutrophils Relative %: 72 %
Platelets: 184 10*3/uL (ref 150–400)
RBC: 5.05 MIL/uL (ref 3.87–5.11)
RDW: 15.5 % (ref 11.5–15.5)
WBC: 6.2 10*3/uL (ref 4.0–10.5)
nRBC: 0 % (ref 0.0–0.2)

## 2023-07-15 LAB — BASIC METABOLIC PANEL
Anion gap: 12 (ref 5–15)
BUN: 19 mg/dL (ref 8–23)
CO2: 21 mmol/L — ABNORMAL LOW (ref 22–32)
Calcium: 8.7 mg/dL — ABNORMAL LOW (ref 8.9–10.3)
Chloride: 104 mmol/L (ref 98–111)
Creatinine, Ser: 1.24 mg/dL — ABNORMAL HIGH (ref 0.44–1.00)
GFR, Estimated: 41 mL/min — ABNORMAL LOW (ref >60)
Glucose, Bld: 80 mg/dL (ref 70–99)
Potassium: 3.6 mmol/L (ref 3.5–5.1)
Sodium: 137 mmol/L (ref 135–145)

## 2023-07-15 LAB — CK: Total CK: 536 U/L — ABNORMAL HIGH (ref 38–234)

## 2023-07-15 MED ORDER — APIXABAN 5 MG PO TABS
5.0000 mg | ORAL_TABLET | Freq: Two times a day (BID) | ORAL | Status: DC
  Administered 2023-07-15 – 2023-07-19 (×3): 5 mg via ORAL
  Filled 2023-07-15 (×3): qty 1

## 2023-07-15 NOTE — Progress Notes (Addendum)
 Over the course of night shift 2/14, Pt had trouble swallowing pills whole in applesauce, so remaining meds were crushed. Pt stated "went to the Dr before for a possible speech eval and during the eval tried to swallow the graham cracker which became stuck in throat. Since then only been eating soft foods." This nurse notified Dr per above requesting for speech eval. No response, no new orders.

## 2023-07-15 NOTE — Progress Notes (Signed)
 Progress Note   Patient: Becky Leach:[id number] DOB: 16-Apr-1930 DOA: 07/13/2023     1 DOS: the patient was seen and examined on 07/15/2023   Brief hospital course:  Becky Leach is a 89 y.o. female with medical history significant of spinal stenosis, unprovoked PE/DVT, hematuria (taken off of AC), arthritis, constipation, herniated disc, hypertension, stomach ulcer, vitamin D deficiency, UTI. She was found on the floor this morning lying up against the bed.   She was admitted to a medical bed. She will be given Tamiflu and IV fluids. She is on droplet precautions. Tamiflu wasrenally adjusted.   The patient is sitting up at bedside today. She states that she is feeling a little better. She continues to cough.  Assessment and Plan: * Influenza A H1N1 infection The patient was started on renally dosed tamiflu. She states that she does not feel well, but is unable to be more specific.   Frequent falls Patient with history of frequent falls. Pt found on floor leaning against the bed this morning. She will need PT/OT eval prior to discharge.  History of pulmonary embolus (PE) The patient was placed on eliquis for this. She was subsequently taken off of it due to hematuria, but was on it again at the time of presentation.  Rhabdomyolysis CK of 968 it has increased to 1188 even with IV fluids. Will continue to support patient with cautious fluids. This is at least partly responsible for AKI. CK has decreased to 536.   Fall at home, initial encounter Pt has a history of frequent falls, but she was found on the floor this morning leaning up against her bed.  Acute renal failure superimposed on stage 3b chronic kidney disease (HCC) The patient has acute on chronic kidney disease, likely prerenal and due to mild rhabdomyolysis. Nephrotoxins will be avoided. Medications will be renally dosed. Creatinine and electrolytes will be monitored. Volume status will be monitored. She will receive  cautious IV fluids.      Subjective: The patient is sitting up at bedside. She states that she is not feeling well.  Physical Exam: Vitals:   07/14/23 2047 07/15/23 0513 07/15/23 0745 07/15/23 1605  BP: (!) 124/93 132/83 (!) 151/100 133/89  Pulse: 90 95 95 (!) 101  Resp: 18 18 16 16   Temp: 99 F (37.2 C) 97.8 F (36.6 C) (!) 97.5 F (36.4 C) 98.6 F (37 C)  TempSrc: Oral Oral Oral Oral  SpO2: (!) 82% 91% 90% 91%  Weight:      Height:       Exam:  Constitutional:  The patient is awake, alert, and oriented x 3. No acute distress. Respiratory:  No increased work of breathing. No rales or rhonchi. Mild wheezes No tactile fremitus Cardiovascular:  Regular rate and rhythm No murmurs, ectopy, or gallups. No lateral PMI. No thrills. Abdomen:  Abdomen is soft, non-tender, non-distended No hernias, masses, or organomegaly Normoactive bowel sounds.  Musculoskeletal:  No cyanosis, clubbing, or edema Skin:  No rashes, lesions, ulcers palpation of skin: no induration or nodules Neurologic:  CN 2-12 intact Sensation all 4 extremities intact Psychiatric:  Mental status Mood, affect appropriate Orientation to person, place, time  judgment and insight appear intact  Family Communication: None available  Disposition: Status is: Inpatient Remains inpatient appropriate because: Due to risk of transformation into severe illness.  Planned Discharge Destination: Home    Time spent: 32 minutes  Author: Fran Lowes, DO 07/15/2023 6:41 PM  For on call review [email or web address].

## 2023-07-16 DIAGNOSIS — J101 Influenza due to other identified influenza virus with other respiratory manifestations: Secondary | ICD-10-CM | POA: Diagnosis not present

## 2023-07-16 LAB — CBC WITH DIFFERENTIAL/PLATELET
Abs Immature Granulocytes: 0.02 10*3/uL (ref 0.00–0.07)
Basophils Absolute: 0 10*3/uL (ref 0.0–0.1)
Basophils Relative: 0 %
Eosinophils Absolute: 0 10*3/uL (ref 0.0–0.5)
Eosinophils Relative: 0 %
HCT: 44.1 % (ref 36.0–46.0)
Hemoglobin: 15.4 g/dL — ABNORMAL HIGH (ref 12.0–15.0)
Immature Granulocytes: 0 %
Lymphocytes Relative: 14 %
Lymphs Abs: 0.9 10*3/uL (ref 0.7–4.0)
MCH: 28.9 pg (ref 26.0–34.0)
MCHC: 34.9 g/dL (ref 30.0–36.0)
MCV: 82.9 fL (ref 80.0–100.0)
Monocytes Absolute: 0.7 10*3/uL (ref 0.1–1.0)
Monocytes Relative: 11 %
Neutro Abs: 5.3 10*3/uL (ref 1.7–7.7)
Neutrophils Relative %: 75 %
Platelets: 167 10*3/uL (ref 150–400)
RBC: 5.32 MIL/uL — ABNORMAL HIGH (ref 3.87–5.11)
RDW: 15.4 % (ref 11.5–15.5)
WBC: 7 10*3/uL (ref 4.0–10.5)
nRBC: 0 % (ref 0.0–0.2)

## 2023-07-16 LAB — BASIC METABOLIC PANEL
Anion gap: 15 (ref 5–15)
BUN: 17 mg/dL (ref 8–23)
CO2: 23 mmol/L (ref 22–32)
Calcium: 9.4 mg/dL (ref 8.9–10.3)
Chloride: 101 mmol/L (ref 98–111)
Creatinine, Ser: 1.23 mg/dL — ABNORMAL HIGH (ref 0.44–1.00)
GFR, Estimated: 41 mL/min — ABNORMAL LOW (ref >60)
Glucose, Bld: 106 mg/dL — ABNORMAL HIGH (ref 70–99)
Potassium: 3.9 mmol/L (ref 3.5–5.1)
Sodium: 139 mmol/L (ref 135–145)

## 2023-07-16 NOTE — Progress Notes (Signed)
 Pt had trouble swallowing meds crushed in applesauce. Pt began coughing and spitting out applesauce. Pt drank water immediately after and still continued to cough. Pt unable to take remaining meds at this time. Pt repositioned and is now resting. This nurse placed speech eval and diet dysphagia one order. Dr. Truitt Merle per above.

## 2023-07-16 NOTE — Progress Notes (Signed)
 Progress Note   Patient: Becky Leach ZOX:[id number] DOB: 1929-10-23 DOA: 07/13/2023     2 DOS: the patient was seen and examined on 07/16/2023   Brief hospital course:  Becky Leach is a 89 y.o. female with medical history significant of spinal stenosis, unprovoked PE/DVT, hematuria (taken off of AC), arthritis, constipation, herniated disc, hypertension, stomach ulcer, vitamin D deficiency, UTI. She was found on the floor this morning lying up against the bed.   She was admitted to a medical bed. She will be given Tamiflu and IV fluids. She is on droplet precautions. Tamiflu wasrenally adjusted.   The patient is sitting up at bedside today. She states that she is feeling a little better. She continues to cough.  Assessment and Plan: * Influenza A H1N1 infection The patient was started on renally dosed tamiflu. She states that she does not feel well, but is unable to be more specific.   Frequent falls Patient with history of frequent falls. Pt found on floor leaning against the bed this morning. She will need PT/OT eval prior to discharge.  History of pulmonary embolus (PE) The patient was placed on eliquis for this. She was subsequently taken off of it due to hematuria, but was on it again at the time of presentation.  Rhabdomyolysis CK of 968 it has increased to 1188 even with IV fluids. Will continue to support patient with cautious fluids. This is at least partly responsible for AKI. CK has decreased to 536.   Fall at home, initial encounter Pt has a history of frequent falls, but she was found on the floor this morning leaning up against her bed.  Acute renal failure superimposed on stage 3b chronic kidney disease (HCC) The patient has acute on chronic kidney disease, likely prerenal and due to mild rhabdomyolysis. Nephrotoxins will be avoided. Medications will be renally dosed. Creatinine and electrolytes will be monitored. Volume status will be monitored. She will receive  cautious IV fluids.      Subjective: The patient is sitting up at bedside. She states that she is not feeling well again today.   Physical Exam: Vitals:   07/16/23 0337 07/16/23 0727 07/16/23 1148 07/16/23 1531  BP: (!) 140/94 (!) 147/88 (!) 138/98 (!) 155/94  Pulse: 92 93 (!) 107 95  Resp: 19 16 16 16   Temp: 98.4 F (36.9 C) 98.2 F (36.8 C) 97.7 F (36.5 C) 97.7 F (36.5 C)  TempSrc: Oral Oral Oral Oral  SpO2: 93% 91% 93% 97%  Weight:      Height:       Exam:  Constitutional:  The patient is awake, alert, and oriented x 3. No acute distress. Respiratory:  No increased work of breathing. No rales or rhonchi. Mild wheezes No tactile fremitus Cardiovascular:  Regular rate and rhythm No murmurs, ectopy, or gallups. No lateral PMI. No thrills. Abdomen:  Abdomen is soft, non-tender, non-distended No hernias, masses, or organomegaly Normoactive bowel sounds.  Musculoskeletal:  No cyanosis, clubbing, or edema Skin:  No rashes, lesions, ulcers palpation of skin: no induration or nodules Neurologic:  CN 2-12 intact Sensation all 4 extremities intact Psychiatric:  Mental status Mood, affect appropriate Orientation to person, place, time  judgment and insight appear intact  Family Communication: None available  Disposition: Status is: Inpatient Remains inpatient appropriate because: Due to risk of transformation into severe illness.  Planned Discharge Destination: Home    Time spent: 30 minutes  Author: Toryn Mcclinton, DO 07/16/2023 3:47 PM  For on  call review [email or web address].

## 2023-07-16 NOTE — Plan of Care (Signed)

## 2023-07-16 NOTE — Evaluation (Signed)
 Clinical/Bedside Swallow Evaluation Patient Details  Name: Becky Leach MRN: [id number] Date of Birth: December 24, 1929  Today's Date: 07/16/2023 Time: SLP Start Time (ACUTE ONLY): 0935 SLP Stop Time (ACUTE ONLY): 4098 SLP Time Calculation (min) (ACUTE ONLY): 17 min  Past Medical History:  Past Medical History:  Diagnosis Date   Acute cystitis 03/11/2023   Acute respiratory failure with hypoxia (HCC) 03/11/2023   AKI (acute kidney injury) (HCC)    due to NSAID   Arthritis    Closed fracture of left proximal humerus 09/14/2017   Constipation    DVT (deep venous thrombosis) (HCC)    Herniated disc    History of hemorrhoids 12/25/2017   History of pulmonary embolism 03/11/2023   Hypertension    Hypertensive retinopathy, bilateral 07/13/2023   Pulmonary embolism (HCC)    Seasonal allergies    Spinal stenosis    Stomach ulcer    due to NSAID   Tachycardia    TIA (transient ischemic attack) 09/09/2020   Tributary (branch) retinal vein occlusion, right eye, with macular edema 07/13/2023   UTI (lower urinary tract infection)    Vitamin D deficiency 09/14/2017   Weight loss 03/19/2021   Past Surgical History:  Past Surgical History:  Procedure Laterality Date   COLONOSCOPY  12/21/2011   Procedure: COLONOSCOPY;  Surgeon: [name], MD;  Location: [facility];  Service: Endoscopy;  Laterality: N/A;   DILATION AND CURETTAGE OF UTERUS     ORIF HUMERUS FRACTURE Left 09/12/2017   Procedure: OPEN REDUCTION INTERNAL FIXATION (ORIF) PROXIMAL  HUMERUS FRACTURE;  Surgeon: Myrene Galas, MD;  Location: [facility] OR;  Service: Orthopedics;  Laterality: Left;   TONSILLECTOMY AND ADENOIDECTOMY     HPI:  The pt is a 89 yo female presenting 2/13 after being found on the floor of he room with hematoma to her head and a skin tear on her R arm. Work up revealed influenza A, no acute bony injury. PMH includes: spinal stenosis, unprovoked PE/DVT, hematuria (taken off of AC), arthritis, constipation, herniated  disc, hypertension, stomach ulcer, vitamin D deficiency, UTI.    Assessment / Plan / Recommendation  Clinical Impression  Patient presents with evidence of a signficant dyphagia, suspect with pharyngeal and esophageal components, based on bedside presentation and most recent MBS in 2024. Patient with multiple rapid swallows, at times maintaining hyolaryngeal elevation/excursion throughout, likely in an attempt to clear suspected pharyngeal residue and/or esophageal backflow. All pos consumed however resulted in an immediate wet vocal quality, throat clear, and cough response suggestive of decreased airway protection. Recent MBS noted possbility of CP bar, which at the time was not impeding function. Question if with acute illness, patient no longer compensating for deficit. Repeat MBS recommended to evaluate swallowing physiology and determine if any po consistencies are safe at this time. NPO until evaluation can be completed, likely next date. SLP Visit Diagnosis: Dysphagia, unspecified (R13.10)       Diet Recommendation NPO    Medication Administration: Via alternative means    Other  Recommendations Oral Care Recommendations: Oral care QID    Recommendations for follow up therapy are one component of a multi-disciplinary discharge planning process, led by the attending physician.  Recommendations may be updated based on patient status, additional functional criteria and insurance authorization.  Follow up Recommendations  (TBD)         Functional Status Assessment Patient has had a recent decline in their functional status and demonstrates the ability to make significant improvements in function in a  reasonable and predictable amount of time.         Prognosis Prognosis for improved oropharyngeal function: Good      Swallow Study   General HPI: The pt is a 89 yo female presenting 2/13 after being found on the floor of he room with hematoma to her head and a skin tear on her R arm.  Work up revealed influenza A, no acute bony injury. PMH includes: spinal stenosis, unprovoked PE/DVT, hematuria (taken off of AC), arthritis, constipation, herniated disc, hypertension, stomach ulcer, vitamin D deficiency, UTI. Type of Study: Bedside Swallow Evaluation Previous Swallow Assessment: MBS complete 03/13/23 with suspicion for pharyngoesophageal dysphagia with recommendations for dysphagia 3, thin liquid. Diet Prior to this Study: Dysphagia 1 (pureed);Thin liquids (Level 0) Temperature Spikes Noted: No Respiratory Status: Room air History of Recent Intubation: No Behavior/Cognition: Requires cueing;Lethargic/Drowsy Oral Cavity Assessment: Other (comment) (green lingual coating) Oral Care Completed by SLP: Recent completion by staff Oral Cavity - Dentition: Edentulous Vision: Functional for self-feeding Self-Feeding Abilities: Needs assist Patient Positioning: Upright in bed Baseline Vocal Quality: Low vocal intensity Volitional Cough: Weak;Congested Volitional Swallow: Able to elicit    Oral/Motor/Sensory Function Overall Oral Motor/Sensory Function: Within functional limits   Ice Chips Ice chips: Not tested   Thin Liquid Thin Liquid: Impaired Presentation: Cup;Straw Pharyngeal  Phase Impairments: Multiple swallows;Throat Clearing - Immediate;Cough - Immediate;Wet Vocal Quality    Nectar Thick Nectar Thick Liquid: Impaired Presentation: Cup;Spoon Oral Phase Impairments: Reduced labial seal Oral phase functional implications: Left anterior spillage Pharyngeal Phase Impairments: Multiple swallows;Throat Clearing - Immediate   Honey Thick Honey Thick Liquid: Not tested   Puree Puree: Impaired Presentation: Spoon Pharyngeal Phase Impairments: Multiple swallows;Cough - Immediate   Solid     Solid: Not tested     Alishba Naples MA, CCC-SLP  Von Quintanar Meryl 07/16/2023,9:59 AM

## 2023-07-17 ENCOUNTER — Inpatient Hospital Stay (HOSPITAL_COMMUNITY): Payer: Medicare Other

## 2023-07-17 DIAGNOSIS — J101 Influenza due to other identified influenza virus with other respiratory manifestations: Secondary | ICD-10-CM | POA: Diagnosis not present

## 2023-07-17 LAB — CBC WITH DIFFERENTIAL/PLATELET
Abs Immature Granulocytes: 0.03 10*3/uL (ref 0.00–0.07)
Basophils Absolute: 0 10*3/uL (ref 0.0–0.1)
Basophils Relative: 0 %
Eosinophils Absolute: 0 10*3/uL (ref 0.0–0.5)
Eosinophils Relative: 0 %
HCT: 43.5 % (ref 36.0–46.0)
Hemoglobin: 15 g/dL (ref 12.0–15.0)
Immature Granulocytes: 0 %
Lymphocytes Relative: 11 %
Lymphs Abs: 1 10*3/uL (ref 0.7–4.0)
MCH: 28.6 pg (ref 26.0–34.0)
MCHC: 34.5 g/dL (ref 30.0–36.0)
MCV: 83 fL (ref 80.0–100.0)
Monocytes Absolute: 0.5 10*3/uL (ref 0.1–1.0)
Monocytes Relative: 6 %
Neutro Abs: 6.9 10*3/uL (ref 1.7–7.7)
Neutrophils Relative %: 83 %
Platelets: 179 10*3/uL (ref 150–400)
RBC: 5.24 MIL/uL — ABNORMAL HIGH (ref 3.87–5.11)
RDW: 15.2 % (ref 11.5–15.5)
WBC: 8.5 10*3/uL (ref 4.0–10.5)
nRBC: 0 % (ref 0.0–0.2)

## 2023-07-17 LAB — BASIC METABOLIC PANEL
Anion gap: 10 (ref 5–15)
BUN: 17 mg/dL (ref 8–23)
CO2: 26 mmol/L (ref 22–32)
Calcium: 9.2 mg/dL (ref 8.9–10.3)
Chloride: 106 mmol/L (ref 98–111)
Creatinine, Ser: 1.19 mg/dL — ABNORMAL HIGH (ref 0.44–1.00)
GFR, Estimated: 43 mL/min — ABNORMAL LOW (ref >60)
Glucose, Bld: 122 mg/dL — ABNORMAL HIGH (ref 70–99)
Potassium: 3.8 mmol/L (ref 3.5–5.1)
Sodium: 142 mmol/L (ref 135–145)

## 2023-07-17 MED ORDER — DEXTROSE-SODIUM CHLORIDE 5-0.45 % IV SOLN
INTRAVENOUS | Status: DC
Start: 2023-07-17 — End: 2023-07-18

## 2023-07-17 NOTE — Progress Notes (Signed)
 Physical Therapy Treatment Patient Details Name: Becky Leach MRN: [id number] DOB: [birth year] Today's Date: 07/17/2023   History of Present Illness The pt is a 89 yo female presenting 2/13 after being found on the floor of he room with hematoma to her head and a skin tear on her R arm. Work up revealed influenza A, no acute bony injury. PMH includes: spinal stenosis, unprovoked PE/DVT, hematuria (taken off of AC), arthritis, constipation, herniated disc, hypertension, stomach ulcer, vitamin D deficiency, UTI.    PT Comments  The pt was asleep upon my arrival, agreeable to session. Pt presents with less awareness and generally needed more frequent and descriptive cues as well as more assistance to complete all mobility. She was in soiled bed and unaware, and then needed more assistance for sit-stand transfers and short distance ambulation in room. Therefore, updated recommendations to continued inpatient rehab <3hours/day to allow for safest return to maximal mobility and independence as pt currently needs more assistance for all mobility than is available at home.     If plan is discharge home, recommend the following: Assistance with cooking/housework;Direct supervision/assist for medications management;Direct supervision/assist for financial management;A lot of help with walking and/or transfers;A lot of help with bathing/dressing/bathroom;Assist for transportation;Help with stairs or ramp for entrance;Supervision due to cognitive status   Can travel by private vehicle     Yes  Equipment Recommendations  None recommended by PT (pt has needed DME)    Recommendations for Other Services       Precautions / Restrictions Precautions Precautions: Fall Recall of Precautions/Restrictions: Impaired Restrictions Weight Bearing Restrictions Per Provider Order: No     Mobility  Bed Mobility Overal bed mobility: Needs Assistance Bed Mobility: Supine to Sit     Supine to sit: HOB elevated,  Used rails, Mod assist     General bed mobility comments: assist to LE, trunk, and use of rails, increased time. pt waited for PT to initiate    Transfers Overall transfer level: Needs assistance Equipment used: Rolling walker (2 wheels) Transfers: Sit to/from Stand, Bed to chair/wheelchair/BSC Sit to Stand: Min assist, Mod assist   Step pivot transfers: Min assist       General transfer comment: modA initially, then minA from toilet    Ambulation/Gait Ambulation/Gait assistance: Min assist Gait Distance (Feet): 15 Feet (+ 20ft) Assistive device: Rolling walker (2 wheels) Gait Pattern/deviations: Step-through pattern, Decreased stride length, Shuffle, Trunk flexed, Narrow base of support, Step-to pattern Gait velocity: decreased     General Gait Details: small shuffling steps with head and trunk flexed. more assist to steady and manage RW, significant trunk flesion      Balance Overall balance assessment: Needs assistance Sitting-balance support: No upper extremity supported, Feet supported Sitting balance-Leahy Scale: Fair     Standing balance support: Bilateral upper extremity supported, During functional activity, Reliant on assistive device for balance Standing balance-Leahy Scale: Poor Standing balance comment: dependent on BUE support                            Communication Communication Communication: No apparent difficulties  Cognition Arousal: Alert Behavior During Therapy: WFL for tasks assessed/performed   PT - Cognitive impairments: Orientation, Memory, Attention, Sequencing, Problem solving, Safety/Judgement   Orientation impairments: Time                   PT - Cognition Comments: decreased awareness and problem solving, needing more cues. soiled of urine and not  aware or asking for assist Following commands: Impaired Following commands impaired: Follows one step commands with increased time    Cueing Cueing Techniques: Verbal  cues, Gestural cues  Exercises      General Comments General comments (skin integrity, edema, etc.): SpO2 stable      Pertinent Vitals/Pain Pain Assessment Pain Assessment: No/denies pain Pain Intervention(s): Monitored during session     PT Goals (current goals can now be found in the care plan section) Acute Rehab PT Goals Patient Stated Goal: to return home PT Goal Formulation: With patient Time For Goal Achievement: 07/28/23 Potential to Achieve Goals: Fair Progress towards PT goals: Progressing toward goals    Frequency    Min 1X/week       AM-PAC PT "6 Clicks" Mobility   Outcome Measure  Help needed turning from your back to your side while in a flat bed without using bedrails?: A Little Help needed moving from lying on your back to sitting on the side of a flat bed without using bedrails?: A Little Help needed moving to and from a bed to a chair (including a wheelchair)?: A Lot Help needed standing up from a chair using your arms (e.g., wheelchair or bedside chair)?: A Lot Help needed to walk in hospital room?: A Lot Help needed climbing 3-5 steps with a railing? : A Lot 6 Click Score: 14    End of Session Equipment Utilized During Treatment: Gait belt;Oxygen Activity Tolerance: Patient limited by fatigue Patient left: in chair;with call bell/phone within reach;with chair alarm set Nurse Communication: Mobility status PT Visit Diagnosis: Unsteadiness on feet (R26.81);Other abnormalities of gait and mobility (R26.89);Muscle weakness (generalized) (M62.81)     Time: 9147-8295 PT Time Calculation (min) (ACUTE ONLY): 24 min  Charges:    $Gait Training: 8-22 mins $Therapeutic Exercise: 8-22 mins PT General Charges $$ ACUTE PT VISIT: 1 Visit                     Vickki Muff, PT, DPT   Acute Rehabilitation Department Office [phone] Secure Chat Communication Preferred   Ronnie Derby 07/17/2023, 4:51 PM

## 2023-07-17 NOTE — Progress Notes (Signed)
 [facility] PROGRESS NOTE   Becky Leach UJW:[id number] DOB: [birth year] DOA: 07/13/2023  PCP: Erick Alley, DO  Brief History: 89 y.o. female with medical history significant of spinal stenosis, unprovoked PE/DVT, hematuria (taken off of AC), arthritis, constipation, herniated disc, hypertension, stomach ulcer, vitamin D deficiency, UTI.  She was found to be lying on the floor by her family member.  She was noted to have influenza.  She was hospitalized for further management.    Consultants: None  Procedures: None    Subjective/Interval History: Complains of feeling fatigued.  Want something to eat and drink.  Appetite has been poor.  No pain issues currently.   Assessment/Plan:  Acute influenza  Patient was treated with Tamiflu.  Respiratory status is stable.  She is however noted to be requiring 2 L of oxygen but saturations are in the late 90s.  Should be able to wean her off.   Frequent falls Patient with history of frequent falls.  Seen by PT and OT.  SNF is recommended for rehab.  Social worker is following.   History of pulmonary embolus (PE) The patient was placed on eliquis for this. She was subsequently taken off of it due to hematuria, but was on it again at the time of presentation. Continue with apixaban for now.  No evidence of overt bleeding.  Hemoglobin is stable.   Rhabdomyolysis Treated with IV fluids with improvement in CK levels.     Acute renal failure superimposed on stage 3b chronic kidney disease (HCC) Renal function has improved.    Concern for oropharyngeal dysphagia Speech therapy is following.  Patient to undergo modified barium swallow.  Noted to be NPO.  DVT Prophylaxis: On apixaban Code Status: Full code Family Communication: Discussed with patient.  No family at bedside Disposition Plan: SNF when medically improved    Medications: Scheduled:  amLODipine  2.5 mg Oral Daily   apixaban  5 mg Oral BID   fesoterodine  4 mg Oral  Daily   mirabegron ER  25 mg Oral q AM   oseltamivir  30 mg Oral Daily   pantoprazole  40 mg Oral Daily   Continuous: FAO:ZHYQMVHQIONGE **OR** acetaminophen, bisacodyl, ondansetron **OR** ondansetron (ZOFRAN) IV, polyethylene glycol  Antibiotics: Anti-infectives (From admission, onward)    Start     Dose/Rate Route Frequency Ordered Stop   07/13/23 1800  oseltamivir (TAMIFLU) capsule 30 mg        30 mg Oral Daily 07/13/23 1750 07/18/23 0959   07/13/23 1730  oseltamivir (TAMIFLU) capsule 75 mg  Status:  Discontinued        75 mg Oral 2 times daily 07/13/23 1715 07/13/23 1750       Objective:  Vital Signs  Vitals:   07/16/23 2035 07/16/23 2230 07/17/23 0507 07/17/23 0749  BP: (!) 143/99  (!) 131/92 (!) 133/98  Pulse: 100 96 85 88  Resp: 19  20 16   Temp: 98.1 F (36.7 C)  97.6 F (36.4 C) (!) 97.4 F (36.3 C)  TempSrc: Oral  Axillary Oral  SpO2: 90% 95% 96% 100%  Weight:      Height:        Intake/Output Summary (Last 24 hours) at 07/17/2023 0931 Last data filed at 07/17/2023 0818 Gross per 24 hour  Intake 0 ml  Output 1160 ml  Net -1160 ml   Filed Weights   07/13/23 1005  Weight: 47.2 kg    General appearance: Awake alert.  In no distress.  Fatigued Resp: Clear  to auscultation bilaterally.  Normal effort Cardio: S1-S2 is normal regular.  No S3-S4.  No rubs murmurs or bruit GI: Abdomen is soft.  Nontender nondistended.  Bowel sounds are present normal.  No masses organomegaly Extremities: No edema.  Able to move all of her extremities Neurologic: No focal neurological deficits.    Lab Results:  Data Reviewed: I have personally reviewed following labs and reports of the imaging studies  CBC: Recent Labs  Lab 07/13/23 1115 07/14/23 0505 07/15/23 0446 07/16/23 0335 07/17/23 0436  WBC 3.9* 6.1 6.2 7.0 8.5  NEUTROABS 2.6  --  4.5 5.3 6.9  HGB 14.5 15.6* 14.3 15.4* 15.0  HCT 46.0 45.9 42.0 44.1 43.5  MCV 92.2 84.7 83.2 82.9 83.0  PLT 170 179 184 167  179    Basic Metabolic Panel: Recent Labs  Lab 07/13/23 1044 07/14/23 0505 07/15/23 0446 07/16/23 0335 07/17/23 0436  NA 137 137 137 139 142  K 4.4 3.8 3.6 3.9 3.8  CL 104 101 104 101 106  CO2 19* 25 21* 23 26  GLUCOSE 104* 88 80 106* 122*  BUN 26* 20 19 17 17   CREATININE 1.77* 1.19* 1.24* 1.23* 1.19*  CALCIUM 9.3 9.2 8.7* 9.4 9.2    GFR: Estimated Creatinine Clearance: 22.5 mL/min (A) (by C-G formula based on SCr of 1.19 mg/dL (H)).  Liver Function Tests: Recent Labs  Lab 07/13/23 1044 07/14/23 0505  AST 66* 79*  ALT 38 42  ALKPHOS 76 70  BILITOT 1.0 1.4*  PROT 7.0 6.8  ALBUMIN 3.7 3.4*    Cardiac Enzymes: Recent Labs  Lab 07/13/23 1044 07/13/23 1512 07/14/23 0504 07/15/23 0446  CKTOTAL 709* 968* 1,188* 536*     Recent Results (from the past 240 hours)  Resp panel by RT-PCR (RSV, Flu A&B, Covid) Anterior Nasal Swab     Status: Abnormal   Collection Time: 07/13/23 10:25 AM   Specimen: Anterior Nasal Swab  Result Value Ref Range Status   SARS Coronavirus 2 by RT PCR NEGATIVE NEGATIVE Final   Influenza A by PCR POSITIVE (A) NEGATIVE Final   Influenza B by PCR NEGATIVE NEGATIVE Final    Comment: (NOTE) The Xpert Xpress SARS-CoV-2/FLU/RSV plus assay is intended as an aid in the diagnosis of influenza from Nasopharyngeal swab specimens and should not be used as a sole basis for treatment. Nasal washings and aspirates are unacceptable for Xpert Xpress SARS-CoV-2/FLU/RSV testing.  Fact Sheet for Patients: BloggerCourse.com  Fact Sheet for Healthcare Providers: SeriousBroker.it  This test is not yet approved or cleared by the [place] FDA and has been authorized for detection and/or diagnosis of SARS-CoV-2 by FDA under an Emergency Use Authorization (EUA). This EUA will remain in effect (meaning this test can be used) for the duration of the COVID-19 declaration under Section 564(b)(1) of the Act,  21 U.S.C. section 360bbb-3(b)(1), unless the authorization is terminated or revoked.     Resp Syncytial Virus by PCR NEGATIVE NEGATIVE Final    Comment: (NOTE) Fact Sheet for Patients: BloggerCourse.com  Fact Sheet for Healthcare Providers: SeriousBroker.it  This test is not yet approved or cleared by the [place] FDA and has been authorized for detection and/or diagnosis of SARS-CoV-2 by FDA under an Emergency Use Authorization (EUA). This EUA will remain in effect (meaning this test can be used) for the duration of the COVID-19 declaration under Section 564(b)(1) of the Act, 21 U.S.C. section 360bbb-3(b)(1), unless the authorization is terminated or revoked.  Performed at [facility],  [address], [place], [place] [address]   Respiratory (~20 pathogens) panel by PCR     Status: Abnormal   Collection Time: 07/14/23  2:47 PM   Specimen: Nasopharyngeal Swab; Respiratory  Result Value Ref Range Status   Adenovirus NOT DETECTED NOT DETECTED Final   Coronavirus 229E NOT DETECTED NOT DETECTED Final    Comment: (NOTE) The Coronavirus on the Respiratory Panel, DOES NOT test for the novel  Coronavirus (2019 nCoV)    Coronavirus HKU1 NOT DETECTED NOT DETECTED Final   Coronavirus NL63 NOT DETECTED NOT DETECTED Final   Coronavirus OC43 NOT DETECTED NOT DETECTED Final   Metapneumovirus NOT DETECTED NOT DETECTED Final   Rhinovirus / Enterovirus NOT DETECTED NOT DETECTED Final   Influenza A H1 2009 DETECTED (A) NOT DETECTED Final   Influenza B NOT DETECTED NOT DETECTED Final   Parainfluenza Virus 1 NOT DETECTED NOT DETECTED Final   Parainfluenza Virus 2 NOT DETECTED NOT DETECTED Final   Parainfluenza Virus 3 NOT DETECTED NOT DETECTED Final   Parainfluenza Virus 4 NOT DETECTED NOT DETECTED Final   Respiratory Syncytial Virus NOT DETECTED NOT DETECTED Final   Bordetella pertussis NOT DETECTED NOT DETECTED Final    Bordetella Parapertussis NOT DETECTED NOT DETECTED Final   Chlamydophila pneumoniae NOT DETECTED NOT DETECTED Final   Mycoplasma pneumoniae NOT DETECTED NOT DETECTED Final    Comment: Performed at [facility], [address], [place], [place] [address]      Radiology Studies: No results found.     LOS: 3 days   Latasha Buczkowski [employer] on [email or web address]  07/17/2023, 9:31 AM

## 2023-07-17 NOTE — TOC Progression Note (Addendum)
 Transition of Care [facility]) - Progression Note    Patient Details  Name: Becky Leach MRN: [id number] Date of Birth: August 17, 1929  [facility] CM/SW Contact  Marliss Coots, LCSW Phone Number: 07/17/2023, 11:48 AM  Clinical Narrative:     11:48 AM CSW left SNF options with Medicare Ratings ([facility], [address], [address], [address], [place], [address], [address], [place], [place]) in patient's room at bedside for patient review upon their return. CSW called patient's son, [name], regarding SNF options.  1:26 PM [name] called CSW to accept bed offer at [facility]. CSW inquired about transportation and offered to coordinate ambulance transportation for discharge to SNF (aware of possible copay that would be billed upon service). [name] expressed interest in ambulance transportation for discharge. CSW relayed decisions to [facility] and medical team.  Expected Discharge Plan: Skilled Nursing Facility Barriers to Discharge: Continued Medical Work up  Expected Discharge Plan and Services   Discharge Planning Services: CM Consult Post Acute Care Choice: Home Health Living arrangements for the past 2 months: Single Family Home                           HH Arranged: PT, OT, RN [facility] Agency: [facility] Date [facility] Agency Contacted: 07/14/23 Time HH Agency Contacted: 1425 Representative spoke with at [facility] Agency: Haywood Lasso   Social Determinants of Health (SDOH) Interventions SDOH Screenings   Food Insecurity: No Food Insecurity (07/13/2023)  Housing: Low Risk  (07/13/2023)  Transportation Needs: No Transportation Needs (07/13/2023)  Utilities: Not At Risk (07/13/2023)  Alcohol Screen: Low Risk  (03/24/2023)  Depression (PHQ2-9): Medium Risk (05/05/2023)  Financial Resource Strain: Low Risk  (03/24/2023)  Physical Activity: Inactive (03/24/2023)  Social Connections: Unknown (07/13/2023)  Stress: No Stress Concern Present (03/24/2023)   Tobacco Use: Low Risk  (07/13/2023)  Health Literacy: Adequate Health Literacy (03/24/2023)    Readmission Risk Interventions     No data to display

## 2023-07-17 NOTE — Plan of Care (Signed)

## 2023-07-17 NOTE — Consult Note (Signed)
 Modified Barium Swallow Study  Patient Details  Name: Becky Leach MRN: [id number] Date of Birth: January 19, 1930  Today's Date: 07/17/2023  Modified Barium Swallow completed.  Full report located under Chart Review in the Imaging Section.  History of Present Illness The pt is a 89 yo female presenting 2/13 after being found on the floor of he room with hematoma to her head and a skin tear on her R arm. Work up revealed influenza A, no acute bony injury. PMH includes: spinal stenosis, unprovoked PE/DVT, hematuria (taken off of AC), arthritis, constipation, herniated disc, hypertension, stomach ulcer, vitamin D deficiency, UTI; Prior MBS completed on 10/24 with recommendations for Dysphagia 3/thin ilquids, but noted a prominent CP bar/spinal stenosis.  Swallow evaluation on 07/16/23 with recommendations for NPO d/t overt s/s of aspiration.  ST f/u for MBS to determine safest diet and dysphagia presence/severity.   Clinical Impression Pt exhibits a severe pharyngeal dysphagia  c/b delay in the initiation of the swallow to the level of the valleculae (may be d/t presbyphagia), but partial epiglottic inversion, partial anterior hyoid movement and superior movement of thyroid resulted in decreased airway closure and silent/frank aspiration of thin/nectar-thick liquids with pt cough unsuccessful at removing all of aspirated material.  Compensatory strategies utilized, but unsuccessful included initiating a cough, using straw, and chin tuck.  Honey-thickened liquids via tsp given and penetrated into laryngeal vestibule, but squeezed out prior to aspiration occurring.  Puree/soft solids did not enter airway, but mod pharyngeal wall residue/vallecular residue and partial obstruction of flow into UES place pt at risk for aspiration with pharyngeal residue (although not seen on this study).  DIGEST Swallow Severity Rating*  Safety: 3  Efficiency:3  Overall Pharyngeal Swallow Severity: severe *The Dynamic Imaging  Grade of Swallowing Toxicity is standardized for the head and neck cancer population, however, demonstrates promising clinical applications across populations to standardize the clinical rating of pharyngeal swallow safety and severity. Recommend NPO with consideration for palliative consult d/t severe pharyngeal dysphagia.  ST will f/u for education/diet tolerance in acute setting/potential for po intake. Factors that may increase risk of adverse event in presence of aspiration Rubye Oaks & Clearance Coots 2021): Poor general health and/or compromised immunity;Respiratory or GI disease;Reduced cognitive function;Frail or deconditioned  Swallow Evaluation Recommendations Recommendations: NPO Medication Administration: Via alternative means Oral care recommendations: Oral care QID (4x/day)      Pat Suhail Peloquin,M.S.,CCC-SLP 07/17/2023,1:57 PM

## 2023-07-18 DIAGNOSIS — R1312 Dysphagia, oropharyngeal phase: Secondary | ICD-10-CM | POA: Diagnosis not present

## 2023-07-18 DIAGNOSIS — E43 Unspecified severe protein-calorie malnutrition: Secondary | ICD-10-CM | POA: Insufficient documentation

## 2023-07-18 DIAGNOSIS — J101 Influenza due to other identified influenza virus with other respiratory manifestations: Secondary | ICD-10-CM | POA: Diagnosis not present

## 2023-07-18 LAB — GLUCOSE, CAPILLARY: Glucose-Capillary: 120 mg/dL — ABNORMAL HIGH (ref 70–99)

## 2023-07-18 MED ORDER — DEXTROSE-SODIUM CHLORIDE 5-0.45 % IV SOLN: INTRAVENOUS | Status: AC

## 2023-07-18 MED ORDER — PHENOL 1.4 % MT LIQD
1.0000 | Freq: Once | OROMUCOSAL | Status: AC
  Administered 2023-07-18: 1 via OROMUCOSAL
  Filled 2023-07-18: qty 177

## 2023-07-18 MED ORDER — OSMOLITE 1.2 CAL PO LIQD
1000.0000 mL | ORAL | Status: DC
Start: 2023-07-18 — End: 2023-07-21
  Administered 2023-07-20: 1000 mL
  Filled 2023-07-18 (×4): qty 1000

## 2023-07-18 MED ORDER — THIAMINE MONONITRATE 100 MG PO TABS
100.0000 mg | ORAL_TABLET | Freq: Every day | ORAL | Status: DC
Start: 2023-07-18 — End: 2023-07-22
  Administered 2023-07-19 – 2023-07-21 (×3): 100 mg
  Filled 2023-07-18 (×3): qty 1

## 2023-07-18 MED ORDER — FREE WATER
150.0000 mL | Freq: Four times a day (QID) | Status: DC
  Administered 2023-07-19 – 2023-07-21 (×10): 150 mL

## 2023-07-18 MED ORDER — FENTANYL CITRATE PF 50 MCG/ML IJ SOSY
6.2500 ug | PREFILLED_SYRINGE | Freq: Once | INTRAMUSCULAR | Status: AC
  Administered 2023-07-18: 6.5 ug via INTRAVENOUS
  Filled 2023-07-18: qty 1

## 2023-07-18 MED ORDER — PROSOURCE TF20 ENFIT COMPATIBL EN LIQD
60.0000 mL | Freq: Every day | ENTERAL | Status: DC
  Administered 2023-07-19 – 2023-07-21 (×3): 60 mL
  Filled 2023-07-18 (×4): qty 60

## 2023-07-18 NOTE — Plan of Care (Signed)

## 2023-07-18 NOTE — Progress Notes (Signed)
 [facility] PROGRESS NOTE   Becky Leach ZOX:[id number] DOB: 12-29-1929 DOA: 07/13/2023  PCP: Erick Alley, DO  Brief History: 89 y.o. female with medical history significant of spinal stenosis, unprovoked PE/DVT, hematuria (taken off of AC), arthritis, constipation, herniated disc, hypertension, stomach ulcer, vitamin D deficiency, UTI.  She was found to be lying on the floor by her family member.  She was noted to have influenza.  She was hospitalized for further management.    Consultants: None  Procedures: None    Subjective/Interval History: Continues to complain of feeling fatigued.  States that her mouth is dry.  She was told about her swallow function evaluation yesterday and the reason why she cannot have anything to eat or drink by mouth.    Assessment/Plan:  Acute influenza  Patient was treated with Tamiflu.  Respiratory status is stable.   Remains on oxygen via nasal cannula but saturations are in the late 90s.  Should be able to wean her off of oxygen.   Respiratory status is stable.  Oropharyngeal dysphagia Underwent swallow evaluation on 2/17 with MBS which suggested severe dysphagia and high aspiration risk.  N.p.o. status was recommended by SLP. Conversations held with patient and her son.  She does not have any focal neurological deficits.  CT head done recently did not show any acute findings.  Her dysphagia could just be secondary to profound weakness from acute illness.   Patient's son wants to try tube feedings to see if there is any improvement in the next few days.  Will request cortrak to be placed. Swallow function to be reassessed in a few days.  If continues to show significant dysphagia then PEG tube may need to be considered. Son agreeable to palliative care consultation.   Frequent falls Patient with history of frequent falls.  Seen by PT and OT.  SNF is recommended for rehab.  TOC is following.   History of pulmonary embolus (PE) The  patient was placed on eliquis for this. She was subsequently taken off of it due to hematuria, but was on it again at the time of presentation. Continue with apixaban for now.  No evidence of overt bleeding.  Hemoglobin is stable.   Rhabdomyolysis Treated with IV fluids with improvement in CK levels.     Acute renal failure superimposed on stage 3b chronic kidney disease (HCC) Renal function has improved.    DVT Prophylaxis: On apixaban Code Status: Full code Family Communication: Discussed with patient.  Discussed with son over the phone. Disposition Plan: SNF when medically improved    Medications: Scheduled:  amLODipine  2.5 mg Oral Daily   apixaban  5 mg Oral BID   fesoterodine  4 mg Oral Daily   mirabegron ER  25 mg Oral q AM   oseltamivir  30 mg Oral Daily   pantoprazole  40 mg Oral Daily   Continuous:  dextrose 5 % and 0.45 % NaCl 75 mL/hr at 07/17/23 2123   WJX:BJYNWGNFAOZHY **OR** acetaminophen, bisacodyl, ondansetron **OR** ondansetron (ZOFRAN) IV, polyethylene glycol  Antibiotics: Anti-infectives (From admission, onward)    Start     Dose/Rate Route Frequency Ordered Stop   07/13/23 1800  oseltamivir (TAMIFLU) capsule 30 mg        30 mg Oral Daily 07/13/23 1750 07/18/23 0959   07/13/23 1730  oseltamivir (TAMIFLU) capsule 75 mg  Status:  Discontinued        75 mg Oral 2 times daily 07/13/23 1715 07/13/23 1750  Objective:  Vital Signs  Vitals:   07/18/23 0031 07/18/23 0453 07/18/23 0751 07/18/23 0857  BP: (!) 145/92 (!) 142/95 (!) 156/94   Pulse: 86 80 81 80  Resp: 18 18 18    Temp: (!) 97.4 F (36.3 C) (!) 97.4 F (36.3 C) 98 F (36.7 C)   TempSrc: Oral Oral    SpO2: 97% 97% 100% 100%  Weight:      Height:        Intake/Output Summary (Last 24 hours) at 07/18/2023 0947 Last data filed at 07/17/2023 2123 Gross per 24 hour  Intake 0 ml  Output 150 ml  Net -150 ml   Filed Weights   07/13/23 1005  Weight: 47.2 kg    General appearance:  Awake alert.  In no distress.  Fatigued Resp: Clear to auscultation bilaterally.  Normal effort Cardio: S1-S2 is normal regular.  No S3-S4.  No rubs murmurs or bruit GI: Abdomen is soft.  Nontender nondistended.  Bowel sounds are present normal.  No masses organomegaly Extremities: No edema.  Significant physical deconditioning is noted. Neurologic: Alert and oriented x3.  No focal neurological deficits.    Lab Results:  Data Reviewed: I have personally reviewed following labs and reports of the imaging studies  CBC: Recent Labs  Lab 07/13/23 1115 07/14/23 0505 07/15/23 0446 07/16/23 0335 07/17/23 0436  WBC 3.9* 6.1 6.2 7.0 8.5  NEUTROABS 2.6  --  4.5 5.3 6.9  HGB 14.5 15.6* 14.3 15.4* 15.0  HCT 46.0 45.9 42.0 44.1 43.5  MCV 92.2 84.7 83.2 82.9 83.0  PLT 170 179 184 167 179    Basic Metabolic Panel: Recent Labs  Lab 07/13/23 1044 07/14/23 0505 07/15/23 0446 07/16/23 0335 07/17/23 0436  NA 137 137 137 139 142  K 4.4 3.8 3.6 3.9 3.8  CL 104 101 104 101 106  CO2 19* 25 21* 23 26  GLUCOSE 104* 88 80 106* 122*  BUN 26* 20 19 17 17   CREATININE 1.77* 1.19* 1.24* 1.23* 1.19*  CALCIUM 9.3 9.2 8.7* 9.4 9.2    GFR: Estimated Creatinine Clearance: 22.5 mL/min (A) (by C-G formula based on SCr of 1.19 mg/dL (H)).  Liver Function Tests: Recent Labs  Lab 07/13/23 1044 07/14/23 0505  AST 66* 79*  ALT 38 42  ALKPHOS 76 70  BILITOT 1.0 1.4*  PROT 7.0 6.8  ALBUMIN 3.7 3.4*    Cardiac Enzymes: Recent Labs  Lab 07/13/23 1044 07/13/23 1512 07/14/23 0504 07/15/23 0446  CKTOTAL 709* 968* 1,188* 536*     Recent Results (from the past 240 hours)  Resp panel by RT-PCR (RSV, Flu A&B, Covid) Anterior Nasal Swab     Status: Abnormal   Collection Time: 07/13/23 10:25 AM   Specimen: Anterior Nasal Swab  Result Value Ref Range Status   SARS Coronavirus 2 by RT PCR NEGATIVE NEGATIVE Final   Influenza A by PCR POSITIVE (A) NEGATIVE Final   Influenza B by PCR NEGATIVE  NEGATIVE Final    Comment: (NOTE) The Xpert Xpress SARS-CoV-2/FLU/RSV plus assay is intended as an aid in the diagnosis of influenza from Nasopharyngeal swab specimens and should not be used as a sole basis for treatment. Nasal washings and aspirates are unacceptable for Xpert Xpress SARS-CoV-2/FLU/RSV testing.  Fact Sheet for Patients: BloggerCourse.com  Fact Sheet for Healthcare Providers: SeriousBroker.it  This test is not yet approved or cleared by the [place] FDA and has been authorized for detection and/or diagnosis of SARS-CoV-2 by FDA under an Emergency Use Authorization (  EUA). This EUA will remain in effect (meaning this test can be used) for the duration of the COVID-19 declaration under Section 564(b)(1) of the Act, 21 U.S.C. section 360bbb-3(b)(1), unless the authorization is terminated or revoked.     Resp Syncytial Virus by PCR NEGATIVE NEGATIVE Final    Comment: (NOTE) Fact Sheet for Patients: BloggerCourse.com  Fact Sheet for Healthcare Providers: SeriousBroker.it  This test is not yet approved or cleared by the [place] FDA and has been authorized for detection and/or diagnosis of SARS-CoV-2 by FDA under an Emergency Use Authorization (EUA). This EUA will remain in effect (meaning this test can be used) for the duration of the COVID-19 declaration under Section 564(b)(1) of the Act, 21 U.S.C. section 360bbb-3(b)(1), unless the authorization is terminated or revoked.  Performed at [facility], [address], [place], [place] [address]   Respiratory (~20 pathogens) panel by PCR     Status: Abnormal   Collection Time: 07/14/23  2:47 PM   Specimen: Nasopharyngeal Swab; Respiratory  Result Value Ref Range Status   Adenovirus NOT DETECTED NOT DETECTED Final   Coronavirus 229E NOT DETECTED NOT DETECTED Final    Comment: (NOTE) The Coronavirus  on the Respiratory Panel, DOES NOT test for the novel  Coronavirus (2019 nCoV)    Coronavirus HKU1 NOT DETECTED NOT DETECTED Final   Coronavirus NL63 NOT DETECTED NOT DETECTED Final   Coronavirus OC43 NOT DETECTED NOT DETECTED Final   Metapneumovirus NOT DETECTED NOT DETECTED Final   Rhinovirus / Enterovirus NOT DETECTED NOT DETECTED Final   Influenza A H1 2009 DETECTED (A) NOT DETECTED Final   Influenza B NOT DETECTED NOT DETECTED Final   Parainfluenza Virus 1 NOT DETECTED NOT DETECTED Final   Parainfluenza Virus 2 NOT DETECTED NOT DETECTED Final   Parainfluenza Virus 3 NOT DETECTED NOT DETECTED Final   Parainfluenza Virus 4 NOT DETECTED NOT DETECTED Final   Respiratory Syncytial Virus NOT DETECTED NOT DETECTED Final   Bordetella pertussis NOT DETECTED NOT DETECTED Final   Bordetella Parapertussis NOT DETECTED NOT DETECTED Final   Chlamydophila pneumoniae NOT DETECTED NOT DETECTED Final   Mycoplasma pneumoniae NOT DETECTED NOT DETECTED Final    Comment: Performed at [facility], [address], [place], [place] [address]      Radiology Studies: DG Swallowing Func-Speech Pathology Result Date: 07/17/2023 Table formatting from the original result was not included. Modified Barium Swallow Study Patient Details Name: MAEZIE JUSTIN MRN: [id number] Date of Birth: 06/10/1929 Today's Date: 07/17/2023 HPI/PMH: HPI: The pt is a 89 yo female presenting 2/13 after being found on the floor of he room with hematoma to her head and a skin tear on her R arm. Work up revealed influenza A, no acute bony injury. PMH includes: spinal stenosis, unprovoked PE/DVT, hematuria (taken off of AC), arthritis, constipation, herniated disc, hypertension, stomach ulcer, vitamin D deficiency, UTI; Prior MBS completed on 10/24 with recommendations for Dysphagia 3/thin liquids, but noted a prominent CP bar/spinal stenosis.  Swallow evaluation on 07/16/23 with recommendations for NPO d/t overt s/s of aspiration.  ST f/u  for MBS to determine safest diet/presence/severity of dysphagia. Clinical Impression: Clinical Impression: Pt exhibits a severe pharyngeal dysphagia c/b delay in the initiation of the swallow to the level of the valleculae (may be d/t presbyphagia), but partial epiglottic inversion, partial anterior hyoid movement and superior movement of thyroid resulted in decreased airway closure and silent/frank aspiration of thin/nectar-thick liquids with pt cough unsuccessful at removing all of  aspirated material.  Compensatory strategies utilized, but unsuccessful included initiating a cough, using straw, and chin tuck.  Honey-thickened liquids via tsp given and penetrated into laryngeal vestibule, but squeezed out prior to aspiration occurring.  Puree/soft solids did not enter airway, but mod pharyngeal wall residue/vallecular residue and partial obstruction of flow into UES place pt at risk for aspiration with pharyngeal residue (although not seen on this study).  DIGEST Swallow Severity Rating*  Safety: 3  Efficiency:3  Overall Pharyngeal Swallow Severity: severe *The Dynamic Imaging Grade of Swallowing Toxicity is standardized for the head and neck cancer population, however, demonstrates promising clinical applications across populations to standardize the clinical rating of pharyngeal swallow safety and severity. Recommend NPO with consideration for palliative consult d/t severe pharyngeal dysphagia.  ST will f/u for education/diet tolerance in acute setting/potential for po intake. Factors that may increase risk of adverse event in presence of aspiration Rubye Oaks & Clearance Coots 2021): Factors that may increase risk of adverse event in presence of aspiration Rubye Oaks & Clearance Coots 2021): Poor general health and/or compromised immunity; Respiratory or GI disease; Reduced cognitive function; Frail or deconditioned Recommendations/Plan: Swallowing Evaluation Recommendations Swallowing Evaluation Recommendations Recommendations: NPO  Medication Administration: Via alternative means Oral care recommendations: Oral care QID (4x/day) Treatment Plan Treatment Plan Treatment recommendations: Therapy as outlined in treatment plan below Follow-up recommendations: Other (comment) (TBD) Functional status assessment: Patient has had a recent decline in their functional status and demonstrates the ability to make significant improvements in function in a reasonable and predictable amount of time. Treatment frequency: Min 2x/week Treatment duration: 1 week Interventions: Aspiration precaution training; Compensatory techniques; Patient/family education; Diet toleration management by SLP Recommendations Recommendations for follow up therapy are one component of a multi-disciplinary discharge planning process, led by the attending physician.  Recommendations may be updated based on patient status, additional functional criteria and insurance authorization. Assessment: Orofacial Exam: Orofacial Exam Oral Cavity - Dentition: Edentulous Orofacial Anatomy: WFL Oral Motor/Sensory Function: Generalized oral weakness Anatomy: Anatomy: Prominent cricopharyngeus; Other (Comment) (spinal stenosis; kyphotic) Boluses Administered: Boluses Administered Boluses Administered: Thin liquids (Level 0); Mildly thick liquids (Level 2, nectar thick); Moderately thick liquids (Level 3, honey thick); Puree; Solid  Oral Impairment Domain: Oral Impairment Domain Lip Closure: Interlabial escape, no progression to anterior lip (Initial swallow of thin only; subsequent swallows adequate) Tongue control during bolus hold: Cohesive bolus between tongue to palatal seal Bolus preparation/mastication: Slow prolonged chewing/mashing with complete recollection (likely d/t lack of dentition/edentulous state) Bolus transport/lingual motion: Brisk tongue motion Oral residue: Trace residue lining oral structures Location of oral residue : Tongue Initiation of pharyngeal swallow : Valleculae   Pharyngeal Impairment Domain: Pharyngeal Impairment Domain Soft palate elevation: Trace column of contrast or air between SP and PW Laryngeal elevation: Partial superior movement of thyroid cartilage/partial approximation of arytenoids to epiglottic petiole Anterior hyoid excursion: Partial anterior movement Epiglottic movement: Partial inversion Laryngeal vestibule closure: Incomplete, narrow column air/contrast in laryngeal vestibule Pharyngeal stripping wave : Present - diminished Pharyngeal contraction (A/P view only): N/A Pharyngoesophageal segment opening: Partial distention/partial duration, partial obstruction of flow Tongue base retraction: Narrow column of contrast or air between tongue base and PPW Pharyngeal residue: Collection of residue within or on pharyngeal structures Location of pharyngeal residue: Valleculae; Pharyngeal wall  Esophageal Impairment Domain: Esophageal Impairment Domain Esophageal clearance upright position: Esophageal retention with retrograde flow below pharyngoesophageal segment (PES) Pill: No data recorded Penetration/Aspiration Scale Score: Penetration/Aspiration Scale Score 3.  Material enters airway, remains ABOVE vocal cords and not ejected out:  Moderately thick liquids (Level 3, honey thick) 7.  Material enters airway, passes BELOW cords and not ejected out despite cough attempt by patient: Thin liquids (Level 0); Mildly thick liquids (Level 2, nectar thick) Compensatory Strategies: Compensatory Strategies Compensatory strategies: Yes Straw: Ineffective Ineffective Straw: Thin liquid (Level 0) Multiple swallows: Ineffective Ineffective Multiple Swallows: Mildly thick liquid (Level 2, nectar thick) Chin tuck: Ineffective Ineffective Chin Tuck: Thin liquid (Level 0)   General Information: Caregiver present: No  Diet Prior to this Study: NPO   Temperature : Normal   Respiratory Status: WFL   Supplemental O2: Nasal cannula (2L)   History of Recent Intubation: No   Behavior/Cognition: Alert; Cooperative; Requires cueing Self-Feeding Abilities: Able to self-feed; Needs assist with self-feeding Baseline vocal quality/speech: Hypophonia/low volume; Dysphonic Volitional Cough: Able to elicit Volitional Swallow: Able to elicit Exam Limitations: No limitations Goal Planning: Prognosis for improved oropharyngeal function: Fair Barriers to Reach Goals: Severity of deficits No data recorded No data recorded Consulted and agree with results and recommendations: Patient; Nurse Pain: Pain Assessment Pain Assessment: No/denies pain End of Session: Start Time:SLP Start Time (ACUTE ONLY): 1205 Stop Time: SLP Stop Time (ACUTE ONLY): 1217 Time Calculation:SLP Time Calculation (min) (ACUTE ONLY): 12 min Charges: SLP Evaluations $ SLP Speech Visit: 1 Visit SLP Evaluations $BSS Swallow: 1 Procedure $MBS Swallow: 1 Procedure SLP visit diagnosis: SLP Visit Diagnosis: Dysphagia, pharyngeal phase (R13.13) Past Medical History: Past Medical History: Diagnosis Date  Acute cystitis 03/11/2023  Acute respiratory failure with hypoxia (HCC) 03/11/2023  AKI (acute kidney injury) (HCC)   due to NSAID  Arthritis   Closed fracture of left proximal humerus 09/14/2017  Constipation   DVT (deep venous thrombosis) (HCC)   Herniated disc   History of hemorrhoids 12/25/2017  History of pulmonary embolism 03/11/2023  Hypertension   Hypertensive retinopathy, bilateral 07/13/2023  Pulmonary embolism (HCC)   Seasonal allergies   Spinal stenosis   Stomach ulcer   due to NSAID  Tachycardia   TIA (transient ischemic attack) 09/09/2020  Tributary (branch) retinal vein occlusion, right eye, with macular edema 07/13/2023  UTI (lower urinary tract infection)   Vitamin D deficiency 09/14/2017  Weight loss 03/19/2021 Past Surgical History: Past Surgical History: Procedure Laterality Date  COLONOSCOPY  12/21/2011  Procedure: COLONOSCOPY;  Surgeon: [name], MD;  Location: [facility];  Service: Endoscopy;  Laterality: N/A;   DILATION AND CURETTAGE OF UTERUS    ORIF HUMERUS FRACTURE Left 09/12/2017  Procedure: OPEN REDUCTION INTERNAL FIXATION (ORIF) PROXIMAL  HUMERUS FRACTURE;  Surgeon: Myrene Galas, MD;  Location: [facility] OR;  Service: Orthopedics;  Laterality: Left;  TONSILLECTOMY AND ADENOIDECTOMY   Pat Adams,M.S.,CCC-SLP 07/17/2023, 2:03 PM      LOS: 4 days   Zaviyar Rahal Rito Ehrlich  [facility] Pager on [email or web address]  07/18/2023, 9:47 AM

## 2023-07-18 NOTE — Progress Notes (Signed)
 Mobility Specialist Progress Note:   07/18/23 1201  Mobility  Activity Ambulated with assistance in room  Level of Assistance Minimal assist, patient does 75% or more  Assistive Device Front wheel walker  Distance Ambulated (ft) 8 ft  Activity Response Tolerated well  Mobility Referral Yes  Mobility visit 1 Mobility  Mobility Specialist Start Time (ACUTE ONLY) 1116  Mobility Specialist Stop Time (ACUTE ONLY) 1140  Mobility Specialist Time Calculation (min) (ACUTE ONLY) 24 min   Pre Mobility: 97% SpO2 3 L During Mobility: 90 HR , 87%-93% SpO2 3 L  Pt received in bed, agreeable to mobility. C/o slight SOB prior to mobility. Found on 3 L. VSS. Pt needing MinA for bed mobility. CG to stand. Upon standing pt displayed heavy posterior lean requiring MinA to correct. After recovery pt was able to ambulate short distance in room with slow steps. After walking 26ft pt requesting seated rest break d/t fatigue. VSS. Pt returned to bed with call bell in reach and all needs met. Bed alarm on. RN notified.   Leory Plowman  Mobility Specialist Please contact via [employer] office at [phone]

## 2023-07-18 NOTE — Progress Notes (Signed)
 Attempts  made X 3 to place NGT, same unsuccessful, MD notified.

## 2023-07-18 NOTE — Progress Notes (Signed)
 Initial Nutrition Assessment  DOCUMENTATION CODES:   Severe malnutrition in context of social or environmental circumstances, Underweight  INTERVENTION:  Initiate tube feeding via NG: Osmolite 1.2 at 50 ml/h (1200 ml per day)  Prosource TF20 60 ml daily  FWF; 150 every 4 hours.  Provides 1520 kcal, 88 gm protein, 1560 ml free water daily  Monitor magnesium, potassium, and phosphorus BID for at least 3 days, MD to replete as needed, as pt is at risk for refeeding syndrome given poor oral intake.  Thiamine 100 mg daily for 7 days   NUTRITION DIAGNOSIS:   Severe Malnutrition related to social / environmental circumstances as evidenced by severe muscle depletion, severe fat depletion.    GOAL:   Patient will meet greater than or equal to 90% of their needs    MONITOR:   TF tolerance  REASON FOR ASSESSMENT:   Consult Enteral/tube feeding initiation and management  ASSESSMENT: 89 y.o. F, presented to ED after family found down leaning against her bed, in the morning. Patient reports that she is not sure how long down for.   medical history significant of spinal stenosis, unprovoked PE/DVT, hematuria (taken off of AC), arthritis, constipation, herniated disc, hypertension, stomach ulcer, vitamin D deficiency, UTI. SLP follow up today revealed: SLP provided po trials of ice chips and small cup sips of H2O. Both continue to elicit evidence of a pharyngeal phase dysphagia with signs of aspiration including multiple swallows and immediate throat clearing with ice chips and explosive cough response with thin liquids via cup sip. Swallowing function likely remains consistent with that of NPO, on 2/16 during MBS. NGT ordered with protocol TF orders in place. It appears as to prior to current dysphagia status patient was consuming ~ 75% of most meals.  Patient stated that she lives alone.  She has not had any eating changes.  She has been smaller most of her life. She was not able to tell  me when her last bowel movement was.  NFPE; revealed severe fat and muscle depletion, unable to determine if all of it is nutrition or age related.  Suspect patient meets criteria for severe malnutrition due to sever fat and muscle depletion, patient poor historian and suspect poor oral intake, she lives by self.     Admit weight: 47.2 kg Weight history: 07/13/23 47.2 kg  05/05/23 47.3 kg  03/24/23 45.4 kg  03/11/23 45.8 kg  03/02/23 46.4 kg  02/27/23 45.7 kg  03/16/22 50.7 kg    Average Meal Intake: NPO  Nutritionally Relevant Medications: Scheduled Meds:  amLODipine  2.5 mg Oral Daily   apixaban  5 mg Oral BID         fesoterodine  4 mg Oral Daily   mirabegron ER  25 mg Oral q AM    Continuous Infusions:  dextrose 5 % and 0.45 % NaCl 50 mL/hr at 07/18/23 1000   feeding supplement (OSMOLITE 1.2 CAL)      Labs Reviewed    NUTRITION - FOCUSED PHYSICAL EXAM:  Flowsheet Row Most Recent Value  Orbital Region Severe depletion  Upper Arm Region Severe depletion  Thoracic and Lumbar Region Severe depletion  Buccal Region Severe depletion  Temple Region Severe depletion  Clavicle Bone Region Severe depletion  Clavicle and Acromion Bone Region Severe depletion  Scapular Bone Region Severe depletion  Dorsal Hand Severe depletion  Patellar Region Severe depletion  Anterior Thigh Region Severe depletion  Posterior Calf Region Severe depletion  Edema (RD Assessment) None  Hair  Reviewed  Eyes Reviewed  Mouth Reviewed  Skin Reviewed  Nails Reviewed       Diet Order:   Diet Order             Diet NPO time specified  Diet effective now                   EDUCATION NEEDS:   Not appropriate for education at this time  Skin:  Skin Assessment: Reviewed RN Assessment  Last BM:  PTA  Height:   Ht Readings from Last 1 Encounters:  07/13/23 5\' 5"  (1.651 m)    Weight:   Wt Readings from Last 1 Encounters:  07/13/23 47.2 kg    Ideal Body Weight:      BMI:  Body mass index is 17.31 kg/m.  Estimated Nutritional Needs:   Kcal:  1420-1675 kcal  Protein:  60-80 g  Fluid:  >/=    Jamelle Haring RDN, LDN Clinical Dietitian   If unable to reach, please contact "RD Inpatient" secure chat group between 8 am-4 pm daily"

## 2023-07-18 NOTE — Progress Notes (Signed)
 Speech Language Pathology Treatment: Dysphagia  Patient Details Name: Becky Leach MRN: [id number] DOB: 12-Mar-1930 Today's Date: 07/18/2023 Time: 1000-1013 SLP Time Calculation (min) (ACUTE ONLY): 13 min  Assessment / Plan / Recommendation Clinical Impression  Patient seen for f/u dysphagia treatment with focus on diagnostic and therapeutic po trials. Patient upright in bed, appearing lethargic but verbalized excitement over trying some pos. SLP provided po trials of ice chips and small cup sips of H2O. Both continue to elicit evidence of a pharyngeal phase dysphagia with signs of aspiration including multiple swallows and immediate throat clearing with ice chips and explosive cough response with thin liquids via cup sip. Swallowing function likely remains consistent with that of 2/16 during MBS at this time. Note plan per MD is for trial of non-oral means of nutrition for hopeful return or at least improvement in swallowing function. SLP will continue to f/u for diagnostic treatment.    HPI HPI: The pt is a 89 yo female presenting 2/13 after being found on the floor of he room with hematoma to her head and a skin tear on her R arm. Work up revealed influenza A, no acute bony injury. PMH includes: spinal stenosis, unprovoked PE/DVT, hematuria (taken off of AC), arthritis, constipation, herniated disc, hypertension, stomach ulcer, vitamin D deficiency, UTI; Prior MBS completed on 10/24 with recommendations for Dysphagia 3/thin ilquids, but noted a prominent CP bar/spinal stensos.  Swallow evaluation on 07/16/23 with recommendations for NPO d/t overt s/s of aspiration.  ST f/u for MBS to determine safest diet.      SLP Plan  Continue with current plan of care      Recommendations for follow up therapy are one component of a multi-disciplinary discharge planning process, led by the attending physician.  Recommendations may be updated based on patient status, additional functional criteria and insurance  authorization.    Recommendations  Diet recommendations: NPO Medication Administration: Via alternative means                  Oral care QID     Dysphagia, pharyngeal phase (R13.13)     Continue with current plan of care    [facility] MA, CCC-SLP  Jim Philemon Meryl  07/18/2023, 10:35 AM

## 2023-07-18 NOTE — Progress Notes (Signed)
 Physical Therapy Treatment Patient Details Name: Becky Leach MRN: [id number] DOB: Oct 06, 1929 Today's Date: 07/18/2023   History of Present Illness The pt is a 89 yo female presenting 2/13 after being found on the floor of he room with hematoma to her head and a skin tear on her R arm. Work up revealed influenza A, no acute bony injury. PMH includes: spinal stenosis, unprovoked PE/DVT, hematuria (taken off of AC), arthritis, constipation, herniated disc, hypertension, stomach ulcer, vitamin D deficiency, UTI.    PT Comments  Pt seen for PT tx with pt pleasant & agreeable. Pt is able to complete bed mobility without assistance, STS from EOB & recliner with education re: hand placement & CGA. Pt ambulates in room with RW & CGA with slow, steady gait speed. Pt tolerates standing at sink ~1-2 minutes to wash face. Pt received & left on 3-3.5L/min via nasal cannula throughout session without any c/o or behaviors demonstrating SOB.  Will continue to follow pt acutely to address balance, strengthening, & gait to increase independence with bed mobility & reduce fall risk.   If plan is discharge home, recommend the following: A little help with bathing/dressing/bathroom;Assistance with cooking/housework;A little help with walking and/or transfers;Assist for transportation;Help with stairs or ramp for entrance   Can travel by private vehicle     Yes  Equipment Recommendations  Other (comment) (defer to next venue)    Recommendations for Other Services       Precautions / Restrictions Precautions Precautions: Fall Restrictions Weight Bearing Restrictions Per Provider Order: No     Mobility  Bed Mobility Overal bed mobility: Needs Assistance Bed Mobility: Supine to Sit     Supine to sit: Supervision, Used rails, HOB elevated (exit R side of bed)          Transfers Overall transfer level: Needs assistance Equipment used: Rolling walker (2 wheels) Transfers: Sit to/from Stand Sit to  Stand: Contact guard assist           General transfer comment: STS from EOB, recliner with educational cuing re: hand placement to push to standing    Ambulation/Gait Ambulation/Gait assistance: Contact guard assist Gait Distance (Feet): 15 Feet (+ 40 ft) Assistive device: Rolling walker (2 wheels) Gait Pattern/deviations: Decreased stride length, Decreased step length - right, Decreased step length - left, Trunk flexed Gait velocity: decreased     General Gait Details: Pt ambulates around bed to recliner, recliner>sink>door>recliner with RW & CGA. No overt lOB.   Stairs             Wheelchair Mobility     Tilt Bed    Modified Rankin (Stroke Patients Only)       Balance Overall balance assessment: Needs assistance Sitting-balance support: No upper extremity supported, Feet supported Sitting balance-Leahy Scale: Fair Sitting balance - Comments: supervision static sitting EOB   Standing balance support: Bilateral upper extremity supported, During functional activity, Single extremity supported, No upper extremity supported Standing balance-Leahy Scale: Poor Standing balance comment: Ambulatory with BUE on RW with CGA, standing at sink to wash face with no UE support>1UE support with min assist.                            Communication Communication Communication: No apparent difficulties  Cognition Arousal: Alert Behavior During Therapy: WFL for tasks assessed/performed, Flat affect  Following commands impaired: Follows one step commands with increased time    Cueing    Exercises      General Comments General comments (skin integrity, edema, etc.): Pt adverse signs/symptoms during session. Pt tolerated session well.      Pertinent Vitals/Pain Pain Assessment Pain Assessment: No/denies pain    Home Living                          Prior Function            PT Goals (current goals  can now be found in the care plan section) Acute Rehab PT Goals Patient Stated Goal: to return home PT Goal Formulation: With patient Time For Goal Achievement: 07/28/23 Potential to Achieve Goals: Fair Progress towards PT goals: Progressing toward goals    Frequency    Min 1X/week      PT Plan      Co-evaluation              AM-PAC PT "6 Clicks" Mobility   Outcome Measure  Help needed turning from your back to your side while in a flat bed without using bedrails?: None Help needed moving from lying on your back to sitting on the side of a flat bed without using bedrails?: A Little Help needed moving to and from a bed to a chair (including a wheelchair)?: A Little Help needed standing up from a chair using your arms (e.g., wheelchair or bedside chair)?: A Little Help needed to walk in hospital room?: A Little Help needed climbing 3-5 steps with a railing? : A Lot 6 Click Score: 18    End of Session Equipment Utilized During Treatment: Oxygen Activity Tolerance: Patient tolerated treatment well Patient left: in chair;with chair alarm set;with call bell/phone within reach Nurse Communication: Mobility status PT Visit Diagnosis: Unsteadiness on feet (R26.81);Other abnormalities of gait and mobility (R26.89);Muscle weakness (generalized) (M62.81)     Time: 9147-8295 PT Time Calculation (min) (ACUTE ONLY): 23 min  Charges:    $Therapeutic Activity: 23-37 mins PT General Charges $$ ACUTE PT VISIT: 1 Visit                     Aleda Grana, PT, DPT 07/18/23, 2:51 PM   Sandi Mariscal 07/18/2023, 2:49 PM

## 2023-07-19 ENCOUNTER — Inpatient Hospital Stay (HOSPITAL_COMMUNITY): Payer: Medicare Other

## 2023-07-19 DIAGNOSIS — Z7189 Other specified counseling: Secondary | ICD-10-CM

## 2023-07-19 DIAGNOSIS — J101 Influenza due to other identified influenza virus with other respiratory manifestations: Secondary | ICD-10-CM | POA: Diagnosis not present

## 2023-07-19 DIAGNOSIS — R1313 Dysphagia, pharyngeal phase: Secondary | ICD-10-CM | POA: Diagnosis not present

## 2023-07-19 DIAGNOSIS — Z515 Encounter for palliative care: Secondary | ICD-10-CM

## 2023-07-19 LAB — BASIC METABOLIC PANEL
Anion gap: 9 (ref 5–15)
BUN: 15 mg/dL (ref 8–23)
CO2: 26 mmol/L (ref 22–32)
Calcium: 9.2 mg/dL (ref 8.9–10.3)
Chloride: 110 mmol/L (ref 98–111)
Creatinine, Ser: 0.73 mg/dL (ref 0.44–1.00)
GFR, Estimated: >60 mL/min (ref >60)
Glucose, Bld: 101 mg/dL — ABNORMAL HIGH (ref 70–99)
Potassium: 3.4 mmol/L — ABNORMAL LOW (ref 3.5–5.1)
Sodium: 145 mmol/L (ref 135–145)

## 2023-07-19 LAB — CBC
HCT: 43.9 % (ref 36.0–46.0)
Hemoglobin: 14.5 g/dL (ref 12.0–15.0)
MCH: 28.5 pg (ref 26.0–34.0)
MCHC: 33 g/dL (ref 30.0–36.0)
MCV: 86.4 fL (ref 80.0–100.0)
Platelets: 224 10*3/uL (ref 150–400)
RBC: 5.08 MIL/uL (ref 3.87–5.11)
RDW: 15.1 % (ref 11.5–15.5)
WBC: 5.1 10*3/uL (ref 4.0–10.5)
nRBC: 0 % (ref 0.0–0.2)

## 2023-07-19 LAB — GLUCOSE, CAPILLARY
Glucose-Capillary: 90 mg/dL (ref 70–99)
Glucose-Capillary: 101 mg/dL — ABNORMAL HIGH (ref 70–99)
Glucose-Capillary: 112 mg/dL — ABNORMAL HIGH (ref 70–99)
Glucose-Capillary: 73 mg/dL (ref 70–99)

## 2023-07-19 LAB — MAGNESIUM: Magnesium: 2.1 mg/dL (ref 1.7–2.4)

## 2023-07-19 LAB — PHOSPHORUS: Phosphorus: 2.2 mg/dL — ABNORMAL LOW (ref 2.5–4.6)

## 2023-07-19 MED ORDER — POTASSIUM CHLORIDE 20 MEQ PO PACK
40.0000 meq | PACK | Freq: Once | ORAL | Status: AC
  Administered 2023-07-19: 40 meq
  Filled 2023-07-19: qty 2

## 2023-07-19 MED ORDER — AMLODIPINE BESYLATE 5 MG PO TABS
2.5000 mg | ORAL_TABLET | Freq: Every day | ORAL | Status: DC
Start: 2023-07-20 — End: 2023-07-21
  Administered 2023-07-20: 2.5 mg
  Filled 2023-07-19: qty 1

## 2023-07-19 MED ORDER — ONDANSETRON HCL 4 MG/2ML IJ SOLN
4.0000 mg | Freq: Four times a day (QID) | INTRAMUSCULAR | Status: DC | PRN
Start: 2023-07-19 — End: 2023-07-22
  Administered 2023-07-21 (×2): 4 mg via INTRAVENOUS
  Filled 2023-07-19 (×2): qty 2

## 2023-07-19 MED ORDER — ACETAMINOPHEN 325 MG PO TABS
650.0000 mg | ORAL_TABLET | Freq: Four times a day (QID) | ORAL | Status: DC | PRN
Start: 2023-07-19 — End: 2023-07-22
  Administered 2023-07-21: 650 mg
  Filled 2023-07-19: qty 2

## 2023-07-19 MED ORDER — ACETAMINOPHEN 650 MG RE SUPP: 650.0000 mg | Freq: Four times a day (QID) | RECTAL | Status: DC | PRN

## 2023-07-19 MED ORDER — APIXABAN 5 MG PO TABS
5.0000 mg | ORAL_TABLET | Freq: Two times a day (BID) | ORAL | Status: DC
  Administered 2023-07-19 – 2023-07-21 (×4): 5 mg
  Filled 2023-07-19 (×4): qty 1

## 2023-07-19 MED ORDER — PANTOPRAZOLE SODIUM 40 MG IV SOLR
40.0000 mg | Freq: Every day | INTRAVENOUS | Status: DC
  Administered 2023-07-19 – 2023-07-21 (×3): 40 mg via INTRAVENOUS
  Filled 2023-07-19 (×3): qty 10

## 2023-07-19 MED ORDER — POLYETHYLENE GLYCOL 3350 17 G PO PACK
17.0000 g | PACK | Freq: Every day | ORAL | Status: DC | PRN
Start: 2023-07-19 — End: 2023-07-22
  Administered 2023-07-21: 17 g
  Filled 2023-07-19: qty 1

## 2023-07-19 MED ORDER — ONDANSETRON HCL 4 MG PO TABS: 4.0000 mg | ORAL_TABLET | Freq: Four times a day (QID) | ORAL | Status: DC | PRN

## 2023-07-19 NOTE — Progress Notes (Addendum)
 PROGRESS NOTE    KAMALANI MASTRO  FAO:[id number] DOB: 08/18/1929 DOA: 07/13/2023 PCP: Erick Alley, DO   Brief Narrative:  89 y.o. female with medical history significant of spinal stenosis, unprovoked PE/DVT, hematuria (taken off of AC), arthritis, constipation, herniated disc, hypertension, stomach ulcer, vitamin D deficiency, UTI was found to be lying on the floor by her family member. She was noted to have influenza. She was hospitalized for further management.  Hospitalized complicated by poor oral intake and severe dysphagia.  Assessment & Plan:   Acute influenza  Acute respiratory failure with hypoxia -treated with Tamiflu.  Respiratory status is stable.   -Still 2 L oxygen by nasal cannula.  Wean off as able.   Oropharyngeal dysphagia -Underwent swallow evaluation on 2/17 with MBS which suggested severe dysphagia and high aspiration risk.  N.p.o. status was recommended by SLP. Conversations held by prior hospitalist with patient and her son.  She does not have any focal neurological deficits.  CT head done recently did not show any acute findings.  Her dysphagia could just be secondary to profound weakness from acute illness.  GI has been consulted for the same. -Patient's son wants to try tube feedings to see if there is any improvement in the next few days.  cortrak to be placed today.  Statin feedings. Swallow function to be reassessed in a few days.  If continues to show significant dysphagia then PEG tube may need to be considered. -Son agreeable to palliative care consultation.   Frequent falls -Patient with history of frequent falls.  -PT and OT recommending SNF.  TOC is following.   History of pulmonary embolus (PE) The patient was placed on eliquis for this. She was subsequently taken off of it due to hematuria, but was on it again at the time of presentation. Continue with apixaban for now.  No evidence of overt bleeding.  Hemoglobin is stable.   Rhabdomyolysis Treated  with IV fluids with improvement in CK levels.     Acute renal failure superimposed on stage 3b chronic kidney disease (HCC) Renal function has improved.    Hypokalemia -Replace.  Repeat a.m. labs.   DVT Prophylaxis: On apixaban Code Status: Full code Family Communication: None at bedside.  Disposition Plan: Status is: Inpatient Remains inpatient appropriate because: Of severity of illness  Consultants: Palliative care.  Procedures: None  Antimicrobials: None   Subjective: Patient seen and examined at bedside.  Awake, extremely slow to respond.  Poor historian.  No fever, seizures, vomiting reported.  Objective: Vitals:   07/18/23 2025 07/19/23 0500 07/19/23 0513 07/19/23 0953  BP: (!) 160/98  (!) 133/98 (!) 146/94  Pulse: 81  83 86  Resp: 18  18   Temp: (!) 97.4 F (36.3 C)  98.2 F (36.8 C) 98 F (36.7 C)  TempSrc: Oral  Oral   SpO2: 100%  100% 97%  Weight:  58.2 kg    Height:        Intake/Output Summary (Last 24 hours) at 07/19/2023 1037 Last data filed at 07/19/2023 0500 Gross per 24 hour  Intake 490.51 ml  Output 1200 ml  Net -709.49 ml   Filed Weights   07/13/23 1005 07/19/23 0500  Weight: 47.2 kg 58.2 kg    Examination:  General exam: Appears calm and comfortable.  Elderly female lying in bed.  On 2 L oxygen by nasal cannula. Respiratory system: Bilateral decreased breath sounds at bases with scattered crackles Cardiovascular system: S1 & S2 heard, Rate controlled Gastrointestinal system:  Abdomen is nondistended, soft and nontender. Normal bowel sounds heard. Extremities: No cyanosis, clubbing, edema  Central nervous system: Awake, slow to respond, poor historian.  No focal neurological deficits. Moving extremities Skin: No rashes, lesions or ulcers Psychiatry: Flat affect and not agitated.     Data Reviewed: I have personally reviewed following labs and imaging studies  CBC: Recent Labs  Lab 07/13/23 1115 07/14/23 0505 07/15/23 0446  07/16/23 0335 07/17/23 0436 07/19/23 0349  WBC 3.9* 6.1 6.2 7.0 8.5 5.1  NEUTROABS 2.6  --  4.5 5.3 6.9  --   HGB 14.5 15.6* 14.3 15.4* 15.0 14.5  HCT 46.0 45.9 42.0 44.1 43.5 43.9  MCV 92.2 84.7 83.2 82.9 83.0 86.4  PLT 170 179 184 167 179 224   Basic Metabolic Panel: Recent Labs  Lab 07/14/23 0505 07/15/23 0446 07/16/23 0335 07/17/23 0436 07/19/23 0349  NA 137 137 139 142 145  K 3.8 3.6 3.9 3.8 3.4*  CL 101 104 101 106 110  CO2 25 21* 23 26 26   GLUCOSE 88 80 106* 122* 101*  BUN 20 19 17 17 15   CREATININE 1.19* 1.24* 1.23* 1.19* 0.73  CALCIUM 9.2 8.7* 9.4 9.2 9.2  MG  --   --   --   --  2.1  PHOS  --   --   --   --  2.2*   GFR: Estimated Creatinine Clearance: 40.4 mL/min (by C-G formula based on SCr of 0.73 mg/dL). Liver Function Tests: Recent Labs  Lab 07/13/23 1044 07/14/23 0505  AST 66* 79*  ALT 38 42  ALKPHOS 76 70  BILITOT 1.0 1.4*  PROT 7.0 6.8  ALBUMIN 3.7 3.4*   No results for input(s): "LIPASE", "AMYLASE" in the last 168 hours. No results for input(s): "AMMONIA" in the last 168 hours. Coagulation Profile: No results for input(s): "INR", "PROTIME" in the last 168 hours. Cardiac Enzymes: Recent Labs  Lab 07/13/23 1044 07/13/23 1512 07/14/23 0504 07/15/23 0446  CKTOTAL 709* 968* 1,188* 536*   BNP (last 3 results) No results for input(s): "PROBNP" in the last 8760 hours. HbA1C: No results for input(s): "HGBA1C" in the last 72 hours. CBG: Recent Labs  Lab 07/18/23 2058 07/19/23 0513 07/19/23 0954  GLUCAP 120* 90 101*   Lipid Profile: No results for input(s): "CHOL", "HDL", "LDLCALC", "TRIG", "CHOLHDL", "LDLDIRECT" in the last 72 hours. Thyroid Function Tests: No results for input(s): "TSH", "T4TOTAL", "FREET4", "T3FREE", "THYROIDAB" in the last 72 hours. Anemia Panel: No results for input(s): "VITAMINB12", "FOLATE", "FERRITIN", "TIBC", "IRON", "RETICCTPCT" in the last 72 hours. Sepsis Labs: No results for input(s): "PROCALCITON",  "LATICACIDVEN" in the last 168 hours.  Recent Results (from the past 240 hours)  Resp panel by RT-PCR (RSV, Flu A&B, Covid) Anterior Nasal Swab     Status: Abnormal   Collection Time: 07/13/23 10:25 AM   Specimen: Anterior Nasal Swab  Result Value Ref Range Status   SARS Coronavirus 2 by RT PCR NEGATIVE NEGATIVE Final   Influenza A by PCR POSITIVE (A) NEGATIVE Final   Influenza B by PCR NEGATIVE NEGATIVE Final    Comment: (NOTE) The Xpert Xpress SARS-CoV-2/FLU/RSV plus assay is intended as an aid in the diagnosis of influenza from Nasopharyngeal swab specimens and should not be used as a sole basis for treatment. Nasal washings and aspirates are unacceptable for Xpert Xpress SARS-CoV-2/FLU/RSV testing.  Fact Sheet for Patients: BloggerCourse.com  Fact Sheet for Healthcare Providers: SeriousBroker.it  This test is not yet approved or cleared by the [place] FDA and has been authorized for detection and/or diagnosis of SARS-CoV-2 by FDA under an Emergency Use Authorization (EUA). This EUA will remain in effect (meaning this test can be used) for the duration of the COVID-19 declaration under Section 564(b)(1) of the Act, 21 U.S.C. section 360bbb-3(b)(1), unless the authorization is terminated or revoked.     Resp Syncytial Virus by PCR NEGATIVE NEGATIVE Final    Comment: (NOTE) Fact Sheet for Patients: BloggerCourse.com  Fact Sheet for Healthcare Providers: SeriousBroker.it  This test is not yet approved or cleared by the [place] FDA and has been authorized for detection and/or diagnosis of SARS-CoV-2 by FDA under an Emergency Use Authorization (EUA). This EUA will remain in effect (meaning this test can be used) for the duration of the COVID-19 declaration under Section 564(b)(1) of the Act, 21 U.S.C. section 360bbb-3(b)(1), unless the authorization is terminated  or revoked.  Performed at [facility], [address] [place], [place] [address]   Respiratory (~20 pathogens) panel by PCR     Status: Abnormal   Collection Time: 07/14/23  2:47 PM   Specimen: Nasopharyngeal Swab; Respiratory  Result Value Ref Range Status   Adenovirus NOT DETECTED NOT DETECTED Final   Coronavirus 229E NOT DETECTED NOT DETECTED Final    Comment: (NOTE) The Coronavirus on the Respiratory Panel, DOES NOT test for the novel  Coronavirus (2019 nCoV)    Coronavirus HKU1 NOT DETECTED NOT DETECTED Final   Coronavirus NL63 NOT DETECTED NOT DETECTED Final   Coronavirus OC43 NOT DETECTED NOT DETECTED Final   Metapneumovirus NOT DETECTED NOT DETECTED Final   Rhinovirus / Enterovirus NOT DETECTED NOT DETECTED Final   Influenza A H1 2009 DETECTED (A) NOT DETECTED Final   Influenza B NOT DETECTED NOT DETECTED Final   Parainfluenza Virus 1 NOT DETECTED NOT DETECTED Final   Parainfluenza Virus 2 NOT DETECTED NOT DETECTED Final   Parainfluenza Virus 3 NOT DETECTED NOT DETECTED Final   Parainfluenza Virus 4 NOT DETECTED NOT DETECTED Final   Respiratory Syncytial Virus NOT DETECTED NOT DETECTED Final   Bordetella pertussis NOT DETECTED NOT DETECTED Final   Bordetella Parapertussis NOT DETECTED NOT DETECTED Final   Chlamydophila pneumoniae NOT DETECTED NOT DETECTED Final   Mycoplasma pneumoniae NOT DETECTED NOT DETECTED Final    Comment: Performed at [facility], [address], [place], [place] [address]         Radiology Studies: DG Abd Portable 1V Result Date: 07/19/2023 CLINICAL DATA:  Enteric catheter placement EXAM: PORTABLE ABDOMEN - 1 VIEW COMPARISON:  07/13/2023 FINDINGS: Frontal view of the lower chest and upper abdomen demonstrates enteric catheter projecting over the known large hiatal hernia containing the majority of the stomach, as seen on previous CT. Persistent consolidation at the lung bases, right greater than left. Oral contrast seen within  the colon. IMPRESSION: 1. Enteric catheter, tip projecting over the large hiatal hernia containing the majority of the stomach as seen on recent CT. Electronically Signed   By: Sharlet Salina M.D.   On: 07/19/2023 09:09   DG Swallowing Func-Speech Pathology Result Date: 07/17/2023 Table formatting from the original result was not included. Modified Barium Swallow Study Patient Details Name: BRADLEIGH SONNEN MRN: [id number] Date of Birth: 12-25-1929 Today's Date: 07/17/2023 HPI/PMH: HPI: The pt is a 89 yo female presenting 2/13 after being found on the floor of he room with hematoma to her head and a skin tear on her R arm. Work up revealed influenza A,  no acute bony injury. PMH includes: spinal stenosis, unprovoked PE/DVT, hematuria (taken off of AC), arthritis, constipation, herniated disc, hypertension, stomach ulcer, vitamin D deficiency, UTI; Prior MBS completed on 10/24 with recommendations for Dysphagia 3/thin liquids, but noted a prominent CP bar/spinal stenosis.  Swallow evaluation on 07/16/23 with recommendations for NPO d/t overt s/s of aspiration.  ST f/u for MBS to determine safest diet/presence/severity of dysphagia. Clinical Impression: Clinical Impression: Pt exhibits a severe pharyngeal dysphagia c/b delay in the initiation of the swallow to the level of the valleculae (may be d/t presbyphagia), but partial epiglottic inversion, partial anterior hyoid movement and superior movement of thyroid resulted in decreased airway closure and silent/frank aspiration of thin/nectar-thick liquids with pt cough unsuccessful at removing all of aspirated material.  Compensatory strategies utilized, but unsuccessful included initiating a cough, using straw, and chin tuck.  Honey-thickened liquids via tsp given and penetrated into laryngeal vestibule, but squeezed out prior to aspiration occurring.  Puree/soft solids did not enter airway, but mod pharyngeal wall residue/vallecular residue and partial obstruction of flow  into UES place pt at risk for aspiration with pharyngeal residue (although not seen on this study).  DIGEST Swallow Severity Rating*  Safety: 3  Efficiency:3  Overall Pharyngeal Swallow Severity: severe *The Dynamic Imaging Grade of Swallowing Toxicity is standardized for the head and neck cancer population, however, demonstrates promising clinical applications across populations to standardize the clinical rating of pharyngeal swallow safety and severity. Recommend NPO with consideration for palliative consult d/t severe pharyngeal dysphagia.  ST will f/u for education/diet tolerance in acute setting/potential for po intake. Factors that may increase risk of adverse event in presence of aspiration Rubye Oaks & Clearance Coots 2021): Factors that may increase risk of adverse event in presence of aspiration Rubye Oaks & Clearance Coots 2021): Poor general health and/or compromised immunity; Respiratory or GI disease; Reduced cognitive function; Frail or deconditioned Recommendations/Plan: Swallowing Evaluation Recommendations Swallowing Evaluation Recommendations Recommendations: NPO Medication Administration: Via alternative means Oral care recommendations: Oral care QID (4x/day) Treatment Plan Treatment Plan Treatment recommendations: Therapy as outlined in treatment plan below Follow-up recommendations: Other (comment) (TBD) Functional status assessment: Patient has had a recent decline in their functional status and demonstrates the ability to make significant improvements in function in a reasonable and predictable amount of time. Treatment frequency: Min 2x/week Treatment duration: 1 week Interventions: Aspiration precaution training; Compensatory techniques; Patient/family education; Diet toleration management by SLP Recommendations Recommendations for follow up therapy are one component of a multi-disciplinary discharge planning process, led by the attending physician.  Recommendations may be updated based on patient status,  additional functional criteria and insurance authorization. Assessment: Orofacial Exam: Orofacial Exam Oral Cavity - Dentition: Edentulous Orofacial Anatomy: WFL Oral Motor/Sensory Function: Generalized oral weakness Anatomy: Anatomy: Prominent cricopharyngeus; Other (Comment) (spinal stenosis; kyphotic) Boluses Administered: Boluses Administered Boluses Administered: Thin liquids (Level 0); Mildly thick liquids (Level 2, nectar thick); Moderately thick liquids (Level 3, honey thick); Puree; Solid  Oral Impairment Domain: Oral Impairment Domain Lip Closure: Interlabial escape, no progression to anterior lip (Initial swallow of thin only; subsequent swallows adequate) Tongue control during bolus hold: Cohesive bolus between tongue to palatal seal Bolus preparation/mastication: Slow prolonged chewing/mashing with complete recollection (likely d/t lack of dentition/edentulous state) Bolus transport/lingual motion: Brisk tongue motion Oral residue: Trace residue lining oral structures Location of oral residue : Tongue Initiation of pharyngeal swallow : Valleculae  Pharyngeal Impairment Domain: Pharyngeal Impairment Domain Soft palate elevation: Trace column of contrast or air between SP and PW Laryngeal elevation:  Partial superior movement of thyroid cartilage/partial approximation of arytenoids to epiglottic petiole Anterior hyoid excursion: Partial anterior movement Epiglottic movement: Partial inversion Laryngeal vestibule closure: Incomplete, narrow column air/contrast in laryngeal vestibule Pharyngeal stripping wave : Present - diminished Pharyngeal contraction (A/P view only): N/A Pharyngoesophageal segment opening: Partial distention/partial duration, partial obstruction of flow Tongue base retraction: Narrow column of contrast or air between tongue base and PPW Pharyngeal residue: Collection of residue within or on pharyngeal structures Location of pharyngeal residue: Valleculae; Pharyngeal wall  Esophageal  Impairment Domain: Esophageal Impairment Domain Esophageal clearance upright position: Esophageal retention with retrograde flow below pharyngoesophageal segment (PES) Pill: No data recorded Penetration/Aspiration Scale Score: Penetration/Aspiration Scale Score 3.  Material enters airway, remains ABOVE vocal cords and not ejected out: Moderately thick liquids (Level 3, honey thick) 7.  Material enters airway, passes BELOW cords and not ejected out despite cough attempt by patient: Thin liquids (Level 0); Mildly thick liquids (Level 2, nectar thick) Compensatory Strategies: Compensatory Strategies Compensatory strategies: Yes Straw: Ineffective Ineffective Straw: Thin liquid (Level 0) Multiple swallows: Ineffective Ineffective Multiple Swallows: Mildly thick liquid (Level 2, nectar thick) Chin tuck: Ineffective Ineffective Chin Tuck: Thin liquid (Level 0)   General Information: Caregiver present: No  Diet Prior to this Study: NPO   Temperature : Normal   Respiratory Status: WFL   Supplemental O2: Nasal cannula (2L)   History of Recent Intubation: No  Behavior/Cognition: Alert; Cooperative; Requires cueing Self-Feeding Abilities: Able to self-feed; Needs assist with self-feeding Baseline vocal quality/speech: Hypophonia/low volume; Dysphonic Volitional Cough: Able to elicit Volitional Swallow: Able to elicit Exam Limitations: No limitations Goal Planning: Prognosis for improved oropharyngeal function: Fair Barriers to Reach Goals: Severity of deficits No data recorded No data recorded Consulted and agree with results and recommendations: Patient; Nurse Pain: Pain Assessment Pain Assessment: No/denies pain End of Session: Start Time:SLP Start Time (ACUTE ONLY): 1205 Stop Time: SLP Stop Time (ACUTE ONLY): 1217 Time Calculation:SLP Time Calculation (min) (ACUTE ONLY): 12 min Charges: SLP Evaluations $ SLP Speech Visit: 1 Visit SLP Evaluations $BSS Swallow: 1 Procedure $MBS Swallow: 1 Procedure SLP visit diagnosis: SLP  Visit Diagnosis: Dysphagia, pharyngeal phase (R13.13) Past Medical History: Past Medical History: Diagnosis Date  Acute cystitis 03/11/2023  Acute respiratory failure with hypoxia (HCC) 03/11/2023  AKI (acute kidney injury) (HCC)   due to NSAID  Arthritis   Closed fracture of left proximal humerus 09/14/2017  Constipation   DVT (deep venous thrombosis) (HCC)   Herniated disc   History of hemorrhoids 12/25/2017  History of pulmonary embolism 03/11/2023  Hypertension   Hypertensive retinopathy, bilateral 07/13/2023  Pulmonary embolism (HCC)   Seasonal allergies   Spinal stenosis   Stomach ulcer   due to NSAID  Tachycardia   TIA (transient ischemic attack) 09/09/2020  Tributary (branch) retinal vein occlusion, right eye, with macular edema 07/13/2023  UTI (lower urinary tract infection)   Vitamin D deficiency 09/14/2017  Weight loss 03/19/2021 Past Surgical History: Past Surgical History: Procedure Laterality Date  COLONOSCOPY  12/21/2011  Procedure: COLONOSCOPY;  Surgeon: [name], MD;  Location: [facility];  Service: Endoscopy;  Laterality: N/A;  DILATION AND CURETTAGE OF UTERUS    ORIF HUMERUS FRACTURE Left 09/12/2017  Procedure: OPEN REDUCTION INTERNAL FIXATION (ORIF) PROXIMAL  HUMERUS FRACTURE;  Surgeon: Myrene Galas, MD;  Location: [facility] OR;  Service: Orthopedics;  Laterality: Left;  TONSILLECTOMY AND ADENOIDECTOMY   Pat Adams,M.S.,CCC-SLP 07/17/2023, 2:03 PM       Scheduled Meds:  amLODipine  2.5 mg Oral  Daily   apixaban  5 mg Oral BID   feeding supplement (PROSource TF20)  60 mL Per Tube Daily   fesoterodine  4 mg Oral Daily   free water  150 mL Per Tube Q6H   mirabegron ER  25 mg Oral q AM   pantoprazole  40 mg Oral Daily   thiamine  100 mg Per Tube Daily   Continuous Infusions:  feeding supplement (OSMOLITE 1.2 CAL)            Glade Lloyd, MD [facility] 07/19/2023, 10:37 AM

## 2023-07-19 NOTE — Plan of Care (Signed)

## 2023-07-19 NOTE — Procedures (Signed)
 Cortrak  Person Inserting Tube:  Champ Keetch T, RD Tube Type:  Cortrak - 43 inches Tube Size:  10 Tube Location:  Right nare Secured by: Bridle Technique Used to Measure Tube Placement:  Marking at nare/corner of mouth Cortrak Secured At:  49 cm   Cortrak Tube Team Note:  Consult received to place a Cortrak feeding tube.   X-ray obtained and read currently pending. Please await read of xray prior to use of tube.   If the tube becomes dislodged please keep the tube and contact the Cortrak team at [email or web address] for replacement.  If after hours and replacement cannot be delayed, place a NG tube and confirm placement with an abdominal x-ray.    Shelle Iron RD, LDN Contact via [employer].

## 2023-07-19 NOTE — Progress Notes (Signed)
 Mobility Specialist Progress Note:   07/19/23 1349  Mobility  Activity Ambulated with assistance in room  Level of Assistance Contact guard assist, steadying assist  Assistive Device Front wheel walker  Distance Ambulated (ft) 30 ft  Activity Response Tolerated well  Mobility Referral Yes  Mobility visit 1 Mobility  Mobility Specialist Start Time (ACUTE ONLY) 1204  Mobility Specialist Stop Time (ACUTE ONLY) 1220  Mobility Specialist Time Calculation (min) (ACUTE ONLY) 16 min   Pt received in bed, agreeable to mobility. CG for all OOB activity. Pt denied any discomfort during session, asx throughout. VSS on 3 L. Pt agreeable to sit up after ambulation. Pt left in chair with call bell in reach and all needs met.  Leory Plowman  Mobility Specialist Please contact via [employer] office at [phone]

## 2023-07-19 NOTE — Progress Notes (Signed)
 Occupational Therapy Treatment Patient Details Name: Becky Leach MRN: [id number] DOB: [birth year] Today's Date: 07/19/2023   History of present illness The pt is a 89 yo female presenting 2/13 after being found on the floor of he room with hematoma to her head and a skin tear on her R arm. Work up revealed influenza A, no acute bony injury. PMH includes: spinal stenosis, unprovoked PE/DVT, hematuria (taken off of AC), arthritis, constipation, herniated disc, hypertension, stomach ulcer, vitamin D deficiency, UTI.   OT comments  Patient continuing to progress towards OT goals. Patient completing ADLs sitting EOB, and then returning back to bed despite encouragement for OOB mobility. Patient able to complete ADLs with min A, and bed mobility at C[facility]. OT continues to recommend therapy at a lesser intensive setting unless family can provide increased assist at home. OT will continue to follow.       If plan is discharge home, recommend the following:  A little help with walking and/or transfers;A little help with bathing/dressing/bathroom;Assistance with cooking/housework;Direct supervision/assist for medications management;Direct supervision/assist for financial management;Assist for transportation;Help with stairs or ramp for entrance   Equipment Recommendations  None recommended by OT    Recommendations for Other Services      Precautions / Restrictions Precautions Precautions: Fall Restrictions Weight Bearing Restrictions Per Provider Order: No       Mobility Bed Mobility Overal bed mobility: Needs Assistance Bed Mobility: Supine to Sit, Sit to Supine     Supine to sit: Contact guard Sit to supine: Contact guard assist   General bed mobility comments: minimal increased time    Transfers Overall transfer level: Needs assistance                 General transfer comment: politely deferring despite encouragement     Balance Overall balance assessment: Needs  assistance Sitting-balance support: No upper extremity supported, Feet supported Sitting balance-Leahy Scale: Fair Sitting balance - Comments: supervision static sitting EOB                                   ADL either performed or assessed with clinical judgement   ADL Overall ADL's : Needs assistance/impaired     Grooming: Wash/dry hands;Wash/dry face;Set up;Sitting       Lower Body Bathing: Minimal assistance;Sit to/from stand   Upper Body Dressing : Minimal assistance;Bed level Upper Body Dressing Details (indicate cue type and reason): switching out gown                 Functional mobility during ADLs: Contact guard assist;Rolling walker (2 wheels) General ADL Comments: Patient continuing to progress towards OT goals. Patient completing ADLs sitting EOB, and then returning back to bed despite encouragement for OOB mobility. Patient able to complete ADLs with min A, and bed mobility at CGA. OT continues to recommend therapy at a lesser intensive setting unless family can provide increased assist at home. OT will continue to follow.    Extremity/Trunk Assessment              Vision       Restaurant manager, fast food Communication: No apparent difficulties   Cognition Arousal: Alert Behavior During Therapy: Flat affect Cognition: No apparent impairments  Following commands: Intact        Cueing   Cueing Techniques: Verbal cues, Gestural cues  Exercises      Shoulder Instructions       General Comments VSS on 3L O2    Pertinent Vitals/ Pain       Pain Assessment Pain Assessment: Faces Faces Pain Scale: Hurts a little bit Pain Location: generalized with movement Pain Descriptors / Indicators: Grimacing, Guarding Pain Intervention(s): Limited activity within patient's tolerance, Monitored during session, Repositioned  Home Living                                           Prior Functioning/Environment              Frequency  Min 1X/week        Progress Toward Goals  OT Goals(current goals can now be found in the care plan section)  Progress towards OT goals: Progressing toward goals  Acute Rehab OT Goals Patient Stated Goal: to have a drink of water OT Goal Formulation: With patient Time For Goal Achievement: 07/28/23 Potential to Achieve Goals: Good  Plan      Co-evaluation                 AM-PAC OT "6 Clicks" Daily Activity     Outcome Measure   Help from another person eating meals?: None Help from another person taking care of personal grooming?: A Little Help from another person toileting, which includes using toliet, bedpan, or urinal?: A Little Help from another person bathing (including washing, rinsing, drying)?: A Little Help from another person to put on and taking off regular upper body clothing?: A Little Help from another person to put on and taking off regular lower body clothing?: A Little 6 Click Score: 19    End of Session Equipment Utilized During Treatment: Oxygen  OT Visit Diagnosis: Unsteadiness on feet (R26.81);Muscle weakness (generalized) (M62.81);History of falling (Z91.81)   Activity Tolerance Patient limited by fatigue   Patient Left in bed;with call bell/phone within reach;with bed alarm set   Nurse Communication Mobility status        Time: 1610-9604 OT Time Calculation (min): 23 min  Charges: OT General Charges $OT Visit: 1 Visit OT Treatments $Self Care/Home Management : 23-37 mins  Pollyann Glen E. Keelen Quevedo, OTR/L Acute Rehabilitation Services [phone]   Inette Doubrava 07/19/2023, 10:54 AM

## 2023-07-19 NOTE — TOC Progression Note (Signed)
 Transition of Care [facility]) - Progression Note    Patient Details  Name: Becky Leach MRN: [id number] Date of Birth: May 10, 1930  [facility] CM/SW Contact  Marliss Coots, LCSW Phone Number: 07/19/2023, 10:07 AM  Clinical Narrative:     10:07 AM Per medical team, patient's son, [name], is deciding between PEG and palliative care. Palliative care consult is currently pending.  Expected Discharge Plan: Skilled Nursing Facility Barriers to Discharge: Continued Medical Work up  Expected Discharge Plan and Services   Discharge Planning Services: CM Consult Post Acute Care Choice: Home Health Living arrangements for the past 2 months: Single Family Home                           HH Arranged: PT, OT, RN [facility] Agency: [facility] Date [facility] Agency Contacted: 07/14/23 Time HH Agency Contacted: 1425 Representative spoke with at [facility] Agency: Haywood Lasso   Social Determinants of Health (SDOH) Interventions SDOH Screenings   Food Insecurity: No Food Insecurity (07/13/2023)  Housing: Low Risk  (07/13/2023)  Transportation Needs: No Transportation Needs (07/13/2023)  Utilities: Not At Risk (07/13/2023)  Alcohol Screen: Low Risk  (03/24/2023)  Depression (PHQ2-9): Medium Risk (05/05/2023)  Financial Resource Strain: Low Risk  (03/24/2023)  Physical Activity: Inactive (03/24/2023)  Social Connections: Unknown (07/13/2023)  Stress: No Stress Concern Present (03/24/2023)  Tobacco Use: Low Risk  (07/13/2023)  Health Literacy: Adequate Health Literacy (03/24/2023)    Readmission Risk Interventions     No data to display

## 2023-07-19 NOTE — Consult Note (Signed)
 [facility] Consult  Referring Provider: [facility] hospitalist Primary Care Physician:  Erick Alley, DO Primary Gastroenterologist: Dr. Pres[facility]  Reason for Consultation: Dysphagia, risk of aspiration  HPI: Becky Leach is a 89 y.o. female who was admitted on 07/13/2023 with influenza. She underwent swallow evaluation on 07/17/2023 with MBS which suggested severe dysphagia and high aspiration risk and was recommended to stay n.p.o. and start NG tube feedings.  3 attempts at NG tube placement were unsuccessful. Patient does not have any dentures and normally takes soft food at home. She feels that the food does not go down beyond her throat.  Recently seen in office on 07/07/23 for chronic GERD and large hiatal hernia Previous GI workup: Colon 2013, [name]: TA X 1 removed from descending colon, ulceration in cecum  Past Medical History:  Diagnosis Date   Acute cystitis 03/11/2023   Acute respiratory failure with hypoxia (HCC) 03/11/2023   AKI (acute kidney injury) (HCC)    due to NSAID   Arthritis    Closed fracture of left proximal humerus 09/14/2017   Constipation    DVT (deep venous thrombosis) (HCC)    Herniated disc    History of hemorrhoids 12/25/2017   History of pulmonary embolism 03/11/2023   Hypertension    Hypertensive retinopathy, bilateral 07/13/2023   Pulmonary embolism (HCC)    Seasonal allergies    Spinal stenosis    Stomach ulcer    due to NSAID   Tachycardia    TIA (transient ischemic attack) 09/09/2020   Tributary (branch) retinal vein occlusion, right eye, with macular edema 07/13/2023   UTI (lower urinary tract infection)    Vitamin D deficiency 09/14/2017   Weight loss 03/19/2021    Past Surgical History:  Procedure Laterality Date   COLONOSCOPY  12/21/2011   Procedure: COLONOSCOPY;  Surgeon: [name], MD;  Location: [facility];  Service: Endoscopy;  Laterality: N/A;   DILATION AND CURETTAGE OF UTERUS     ORIF HUMERUS FRACTURE  Left 09/12/2017   Procedure: OPEN REDUCTION INTERNAL FIXATION (ORIF) PROXIMAL  HUMERUS FRACTURE;  Surgeon: Myrene Galas, MD;  Location: [facility];  Service: Orthopedics;  Laterality: Left;   TONSILLECTOMY AND ADENOIDECTOMY      Prior to Admission medications   Medication Sig Start Date End Date Taking? Authorizing Provider  acetaminophen (TYLENOL) 325 MG tablet Take 2 tablets (650 mg total) by mouth every 6 (six) hours as needed for mild pain (pain score 1-3) (or Fever >/= 101). 03/15/23  Yes Sheikh, Omair Latif, DO  amLODipine (NORVASC) 2.5 MG tablet Take 1 tablet (2.5 mg total) by mouth daily. 03/15/23  Yes Sheikh, Omair Latif, DO  apixaban (ELIQUIS) 5 MG TABS tablet Take 2 tablets (10 mg total) by mouth 2 (two) times daily for 7 days, THEN 1 tablet (5 mg total) 2 (two) times daily. Patient taking differently: Take 1 tablet (5 mg total) 2 (two) times daily. 03/15/23 07/14/23 Yes Sheikh, Omair Latif, DO  atorvastatin (LIPITOR) 40 MG tablet TAKE 1 TABLET BY MOUTH EVERY DAY 05/26/23  Yes Erick Alley, DO  Cholecalciferol (VITAMIN D-3) 25 MCG (1000 UT) CAPS Take 1,000 Units by mouth daily.   Yes [provider]  guaiFENesin (ROBITUSSIN) 100 MG/5ML liquid Take 5-15 mLs by mouth every 4 (four) hours as needed for cough or to loosen phlegm.   Yes [provider]  latanoprost (XALATAN) 0.005 % ophthalmic solution Place 1 drop into both eyes at bedtime. 07/03/20  Yes [provider]  loratadine (  CLARITIN) 10 MG tablet Take 1 tablet (10 mg total) by mouth daily. 01/03/18  Yes Mannam, Praveen, MD  MYRBETRIQ 50 MG TB24 tablet Take 50 mg by mouth in the morning. 11/22/18  Yes [provider]  omeprazole (PRILOSEC) 40 MG capsule TAKE 1 CAPSULE (40 MG TOTAL) BY MOUTH DAILY. 03/10/23  Yes Erick Alley, DO  trospium (SANCTURA) 20 MG tablet Take 20 mg by mouth at bedtime.   Yes [provider]    Current Facility-Administered Medications  Medication Dose Route Frequency  Provider Last Rate Last Admin   acetaminophen (TYLENOL) tablet 650 mg  650 mg Oral Q6H PRN Swayze, Ava, DO   650 mg at 07/15/23 2346   Or   acetaminophen (TYLENOL) suppository 650 mg  650 mg Rectal Q6H PRN Swayze, Ava, DO       amLODipine (NORVASC) tablet 2.5 mg  2.5 mg Oral Daily Swayze, Ava, DO   2.5 mg at 07/16/23 0844   apixaban (ELIQUIS) tablet 5 mg  5 mg Oral BID Swayze, Ava, DO   5 mg at 07/16/23 0843   bisacodyl (DULCOLAX) suppository 10 mg  10 mg Rectal Daily PRN Swayze, Ava, DO       dextrose 5 % and 0.45 % NaCl infusion   Intravenous Continuous Osvaldo Shipper, MD 50 mL/hr at 07/19/23 0441 New Bag at 07/19/23 0441   feeding supplement (OSMOLITE 1.2 CAL) liquid 1,000 mL  1,000 mL Per Tube Continuous Osvaldo Shipper, MD       feeding supplement (PROSource TF20) liquid 60 mL  60 mL Per Tube Daily Osvaldo Shipper, MD       fesoterodine (TOVIAZ) tablet 4 mg  4 mg Oral Daily Swayze, Ava, DO   4 mg at 07/16/23 1610   free water 150 mL  150 mL Per Tube Q6H Osvaldo Shipper, MD       mirabegron ER (MYRBETRIQ) tablet 25 mg  25 mg Oral q AM Swayze, Ava, DO   25 mg at 07/16/23 0842   ondansetron (ZOFRAN) tablet 4 mg  4 mg Oral Q6H PRN Swayze, Ava, DO       Or   ondansetron (ZOFRAN) injection 4 mg  4 mg Intravenous Q6H PRN Swayze, Ava, DO   4 mg at 07/15/23 2358   pantoprazole (PROTONIX) EC tablet 40 mg  40 mg Oral Daily Swayze, Ava, DO   40 mg at 07/16/23 0843   polyethylene glycol (MIRALAX / GLYCOLAX) packet 17 g  17 g Oral Daily PRN Swayze, Ava, DO   17 g at 07/15/23 1143   thiamine (VITAMIN B1) tablet 100 mg  100 mg Per Tube Daily Osvaldo Shipper, MD        Allergies as of 07/13/2023 - Review Complete 07/13/2023  Allergen Reaction Noted   Nsaids Other (See Comments) 08/22/2020   Penicillin g Rash 07/13/2023   Penicillins Rash and Other (See Comments) 12/06/2011    Family History  Problem Relation Age of Onset   Arthritis Mother    Heart attack Mother     Social History    Socioeconomic History   Marital status: Single    Spouse name: Not on file   Number of children: 4   Years of education: Not on file   Highest education level: Not on file  Occupational History   Not on file  Tobacco Use   Smoking status: Never   Smokeless tobacco: Never  Vaping Use   Vaping status: Never Used  Substance and Sexual Activity  Alcohol use: No   Drug use: No   Sexual activity: Never  Other Topics Concern   Not on file  Social History Narrative   Born: [place], [place]   Lives with son, Beverely Pace.            Social Drivers of Corporate investment banker Strain: Low Risk  (03/24/2023)   Overall Financial Resource Strain (CARDIA)    Difficulty of Paying Living Expenses: Not hard at all  Food Insecurity: No Food Insecurity (07/13/2023)   Hunger Vital Sign    Worried About Running Out of Food in the Last Year: Never true    Ran Out of Food in the Last Year: Never true  Transportation Needs: No Transportation Needs (07/13/2023)   PRAPARE - Administrator, Civil Service (Medical): No    Lack of Transportation (Non-Medical): No  Physical Activity: Inactive (03/24/2023)   Exercise Vital Sign    Days of Exercise per Week: 0 days    Minutes of Exercise per Session: 0 min  Stress: No Stress Concern Present (03/24/2023)   [employer] of Occupational Health - Occupational Stress Questionnaire    Feeling of Stress : Not at all  Social Connections: Unknown (07/13/2023)   Social Connection and Isolation Panel [NHANES]    Frequency of Communication with Friends and Family: Twice a week    Frequency of Social Gatherings with Friends and Family: Twice a week    Attends Religious Services: 1 to 4 times per year    Active Member of Clubs or Organizations: Yes    Attends Banker Meetings: Never    Marital Status: Patient declined  Intimate Partner Violence: Not At Risk (07/13/2023)   Humiliation, Afraid, Rape, and Kick questionnaire    Fear of  Current or Ex-Partner: No    Emotionally Abused: No    Physically Abused: No    Sexually Abused: No    Review of Systems: As per HPI   Physical Exam: Vital signs in last 24 hours: Temp:  [97.4 F (36.3 C)-98.2 F (36.8 C)] 98.2 F (36.8 C) (02/19 0513) Pulse Rate:  [80-83] 83 (02/19 0513) Resp:  [18] 18 (02/19 0513) BP: (131-160)/(87-98) 133/98 (02/19 0513) SpO2:  [97 %-100 %] 100 % (02/19 0513) Weight:  [58.2 kg] 58.2 kg (02/19 0500) Last BM Date :  (PTA)  General:   Elderly, frail  Head:  Normocephalic and atraumatic. Eyes:  Sclera clear, no icterus.   Conjunctiva pink. Ears:  Normal auditory acuity. Nose:  No deformity, discharge,  or lesions. Mouth: No teeth present Neck:  Supple; no masses or thyromegaly. Lungs:  Clear throughout to auscultation.   No wheezes, crackles, or rhonchi. No acute distress. Heart:  Regular rate and rhythm; no murmurs, clicks, rubs,  or gallops. Extremities:  Without clubbing or edema. Neurologic:  Alert and  oriented x4;  grossly normal neurologically. Skin:  Intact without significant lesions or rashes. Psych:  Alert and cooperative. Normal mood and affect. Abdomen:  Soft, nontender and nondistended. No masses, hepatosplenomegaly or hernias noted. Normal bowel sounds, without guarding, and without rebound.         Lab Results: Recent Labs    07/17/23 0436 07/19/23 0349  WBC 8.5 5.1  HGB 15.0 14.5  HCT 43.5 43.9  PLT 179 224   BMET Recent Labs    07/17/23 0436 07/19/23 0349  NA 142 145  K 3.8 3.4*  CL 106 110  CO2 26 26  GLUCOSE 122* 101*  BUN 17 15  CREATININE 1.19* 0.73  CALCIUM 9.2 9.2   LFT No results for input(s): "PROT", "ALBUMIN", "AST", "ALT", "ALKPHOS", "BILITOT", "BILIDIR", "IBILI" in the last 72 hours. PT/INR No results for input(s): "LABPROT", "INR" in the last 72 hours.  Studies/Results: DG Swallowing Func-Speech Pathology Result Date: 07/17/2023 Table formatting from the original result was not  included. Modified Barium Swallow Study Patient Details Name: LATACHA TEXEIRA MRN: [id number] Date of Birth: 1929-08-28 Today's Date: 07/17/2023 HPI/PMH: HPI: The pt is a 89 yo female presenting 2/13 after being found on the floor of he room with hematoma to her head and a skin tear on her R arm. Work up revealed influenza A, no acute bony injury. PMH includes: spinal stenosis, unprovoked PE/DVT, hematuria (taken off of AC), arthritis, constipation, herniated disc, hypertension, stomach ulcer, vitamin D deficiency, UTI; Prior MBS completed on 10/24 with recommendations for Dysphagia 3/thin liquids, but noted a prominent CP bar/spinal stenosis.  Swallow evaluation on 07/16/23 with recommendations for NPO d/t overt s/s of aspiration.  ST f/u for MBS to determine safest diet/presence/severity of dysphagia. Clinical Impression: Clinical Impression: Pt exhibits a severe pharyngeal dysphagia c/b delay in the initiation of the swallow to the level of the valleculae (may be d/t presbyphagia), but partial epiglottic inversion, partial anterior hyoid movement and superior movement of thyroid resulted in decreased airway closure and silent/frank aspiration of thin/nectar-thick liquids with pt cough unsuccessful at removing all of aspirated material.  Compensatory strategies utilized, but unsuccessful included initiating a cough, using straw, and chin tuck.  Honey-thickened liquids via tsp given and penetrated into laryngeal vestibule, but squeezed out prior to aspiration occurring.  Puree/soft solids did not enter airway, but mod pharyngeal wall residue/vallecular residue and partial obstruction of flow into UES place pt at risk for aspiration with pharyngeal residue (although not seen on this study).  DIGEST Swallow Severity Rating*  Safety: 3  Efficiency:3  Overall Pharyngeal Swallow Severity: severe *The Dynamic Imaging Grade of Swallowing Toxicity is standardized for the head and neck cancer population, however, demonstrates  promising clinical applications across populations to standardize the clinical rating of pharyngeal swallow safety and severity. Recommend NPO with consideration for palliative consult d/t severe pharyngeal dysphagia.  ST will f/u for education/diet tolerance in acute setting/potential for po intake. Factors that may increase risk of adverse event in presence of aspiration Rubye Oaks & Clearance Coots 2021): Factors that may increase risk of adverse event in presence of aspiration Rubye Oaks & Clearance Coots 2021): Poor general health and/or compromised immunity; Respiratory or GI disease; Reduced cognitive function; Frail or deconditioned Recommendations/Plan: Swallowing Evaluation Recommendations Swallowing Evaluation Recommendations Recommendations: NPO Medication Administration: Via alternative means Oral care recommendations: Oral care QID (4x/day) Treatment Plan Treatment Plan Treatment recommendations: Therapy as outlined in treatment plan below Follow-up recommendations: Other (comment) (TBD) Functional status assessment: Patient has had a recent decline in their functional status and demonstrates the ability to make significant improvements in function in a reasonable and predictable amount of time. Treatment frequency: Min 2x/week Treatment duration: 1 week Interventions: Aspiration precaution training; Compensatory techniques; Patient/family education; Diet toleration management by SLP Recommendations Recommendations for follow up therapy are one component of a multi-disciplinary discharge planning process, led by the attending physician.  Recommendations may be updated based on patient status, additional functional criteria and insurance authorization. Assessment: Orofacial Exam: Orofacial Exam Oral Cavity - Dentition: Edentulous Orofacial Anatomy: WFL Oral Motor/Sensory Function: Generalized oral weakness Anatomy: Anatomy: Prominent cricopharyngeus; Other (Comment) (spinal stenosis; kyphotic) Boluses Administered: Boluses  Administered  Boluses Administered: Thin liquids (Level 0); Mildly thick liquids (Level 2, nectar thick); Moderately thick liquids (Level 3, honey thick); Puree; Solid  Oral Impairment Domain: Oral Impairment Domain Lip Closure: Interlabial escape, no progression to anterior lip (Initial swallow of thin only; subsequent swallows adequate) Tongue control during bolus hold: Cohesive bolus between tongue to palatal seal Bolus preparation/mastication: Slow prolonged chewing/mashing with complete recollection (likely d/t lack of dentition/edentulous state) Bolus transport/lingual motion: Brisk tongue motion Oral residue: Trace residue lining oral structures Location of oral residue : Tongue Initiation of pharyngeal swallow : Valleculae  Pharyngeal Impairment Domain: Pharyngeal Impairment Domain Soft palate elevation: Trace column of contrast or air between SP and PW Laryngeal elevation: Partial superior movement of thyroid cartilage/partial approximation of arytenoids to epiglottic petiole Anterior hyoid excursion: Partial anterior movement Epiglottic movement: Partial inversion Laryngeal vestibule closure: Incomplete, narrow column air/contrast in laryngeal vestibule Pharyngeal stripping wave : Present - diminished Pharyngeal contraction (A/P view only): N/A Pharyngoesophageal segment opening: Partial distention/partial duration, partial obstruction of flow Tongue base retraction: Narrow column of contrast or air between tongue base and PPW Pharyngeal residue: Collection of residue within or on pharyngeal structures Location of pharyngeal residue: Valleculae; Pharyngeal wall  Esophageal Impairment Domain: Esophageal Impairment Domain Esophageal clearance upright position: Esophageal retention with retrograde flow below pharyngoesophageal segment (PES) Pill: No data recorded Penetration/Aspiration Scale Score: Penetration/Aspiration Scale Score 3.  Material enters airway, remains ABOVE vocal cords and not ejected out:  Moderately thick liquids (Level 3, honey thick) 7.  Material enters airway, passes BELOW cords and not ejected out despite cough attempt by patient: Thin liquids (Level 0); Mildly thick liquids (Level 2, nectar thick) Compensatory Strategies: Compensatory Strategies Compensatory strategies: Yes Straw: Ineffective Ineffective Straw: Thin liquid (Level 0) Multiple swallows: Ineffective Ineffective Multiple Swallows: Mildly thick liquid (Level 2, nectar thick) Chin tuck: Ineffective Ineffective Chin Tuck: Thin liquid (Level 0)   General Information: Caregiver present: No  Diet Prior to this Study: NPO   Temperature : Normal   Respiratory Status: WFL   Supplemental O2: Nasal cannula (2L)   History of Recent Intubation: No  Behavior/Cognition: Alert; Cooperative; Requires cueing Self-Feeding Abilities: Able to self-feed; Needs assist with self-feeding Baseline vocal quality/speech: Hypophonia/low volume; Dysphonic Volitional Cough: Able to elicit Volitional Swallow: Able to elicit Exam Limitations: No limitations Goal Planning: Prognosis for improved oropharyngeal function: Fair Barriers to Reach Goals: Severity of deficits No data recorded No data recorded Consulted and agree with results and recommendations: Patient; Nurse Pain: Pain Assessment Pain Assessment: No/denies pain End of Session: Start Time:SLP Start Time (ACUTE ONLY): 1205 Stop Time: SLP Stop Time (ACUTE ONLY): 1217 Time Calculation:SLP Time Calculation (min) (ACUTE ONLY): 12 min Charges: SLP Evaluations $ SLP Speech Visit: 1 Visit SLP Evaluations $BSS Swallow: 1 Procedure $MBS Swallow: 1 Procedure SLP visit diagnosis: SLP Visit Diagnosis: Dysphagia, pharyngeal phase (R13.13) Past Medical History: Past Medical History: Diagnosis Date  Acute cystitis 03/11/2023  Acute respiratory failure with hypoxia (HCC) 03/11/2023  AKI (acute kidney injury) (HCC)   due to NSAID  Arthritis   Closed fracture of left proximal humerus 09/14/2017  Constipation   DVT (deep  venous thrombosis) (HCC)   Herniated disc   History of hemorrhoids 12/25/2017  History of pulmonary embolism 03/11/2023  Hypertension   Hypertensive retinopathy, bilateral 07/13/2023  Pulmonary embolism (HCC)   Seasonal allergies   Spinal stenosis   Stomach ulcer   due to NSAID  Tachycardia   TIA (transient ischemic attack) 09/09/2020  Tributary (branch) retinal vein occlusion,  right eye, with macular edema 07/13/2023  UTI (lower urinary tract infection)   Vitamin D deficiency 09/14/2017  Weight loss 03/19/2021 Past Surgical History: Past Surgical History: Procedure Laterality Date  COLONOSCOPY  12/21/2011  Procedure: COLONOSCOPY;  Surgeon: [name], MD;  Location: [facility];  Service: Endoscopy;  Laterality: N/A;  DILATION AND CURETTAGE OF UTERUS    ORIF HUMERUS FRACTURE Left 09/12/2017  Procedure: OPEN REDUCTION INTERNAL FIXATION (ORIF) PROXIMAL  HUMERUS FRACTURE;  Surgeon: Myrene Galas, MD;  Location: [facility];  Service: Orthopedics;  Laterality: Left;  TONSILLECTOMY AND ADENOIDECTOMY   Pat Adams,M.S.,CCC-SLP 07/17/2023, 2:03 PM   Impression: Dysphagia-pharyngeal, recommended n.p.o., aspiration risk  Influenza positive History of pulmonary embolism, taken off Eliquis due to hematuria and restarted now Rhabdomyolysis Renal impairment  Plan: Spoke with the patient and her son Luanna Weesner, [phone] regarding findings of pharyngeal dysphagia, increased risk of aspiration and possibly needing feeding tube for nutritional purposes. Her son wants to talk to other family members to decide between proceeding with IR guided feeding tube placement versus palliative care. No further input needed from GI. Will sign off, please recall if needed.   LOS: 5 days   Kerin Salen, MD  07/19/2023, 7:48 AM

## 2023-07-19 NOTE — Consult Note (Signed)
 Consultation Note Date: 07/19/2023   Patient Name: Becky Leach  DOB: 11-12-1929  MRN: [id number]  Age / Sex: 89 y.o., female  PCP: Erick Alley, DO Referring Physician: Glade Lloyd, MD  Reason for Consultation: Establishing goals of care  HPI/Patient Profile: 89 y.o. female  with past medical history of spinal stenosis, unprovoked PE/DVT, arthritis, constipation, herniated disc, HTN, stomach ulcer, vitamin D deficiency, UTI admitted on 07/13/2023 with found down on floor by family and found to have influenza. Also with severe dysphagia and now NPO status.   Clinical Assessment and Goals of Care: Consult received and chart review completed. Notes reviewed and Advance Directive reviewed. I met today with Ms. Lindenbaum. She is sitting up in chair and has Cortrak in place. She appears thin and frail. She is alert and oriented. I asked her if the doctors have been talking to her and she tells me they have been talking to [place]. She is able to tell me about her fall at home and having the flu. She is able to tell me about the tube in her nose is for food. She talks of being tired, hopes of improvement and wanting to feel better, but also tells me "I'm not sure if I can." She does not seem particularly worried about her situation. I tried to ask her about what she would want in situations such as life support (reassuring her that I do not believe she needs this but I don't know her wishes if I don't ask) and she tells me "I don't think I would want anything like that - ask [name]."  I called and discussed with son, [name], and [name] called in his brother Fayrene Fearing to the conversation as well. We discussed the risks vs benefits and realities of PEG. We discussed that she would need 24/7 assistance and care with PEG tube. We discussed the unlikelihood that this would improve her quality of life. I noticed phone calls with PCP with  concerns for increased secretions even in Dec 2024 making me concerned that she has had impairment in swallowing for longer than we have realized. We reviewed the option of comfort feeds as an alternative to PEG tube. Time for outcomes. We will plan for follow up in person meeting vs conference call to discuss further in the coming days. I also discussed with [name] and Fayrene Fearing code status and recommended DNR based on expected outcomes, Advance Directive, and my conversation with their mother. They agree with DNR/DNI at this time but wish for continued treatment otherwise.   All questions/concerns addressed. Emotional support provided.   Primary Decision Maker PATIENT and HCPOA [name]    SUMMARY OF RECOMMENDATIONS   - DNR/DNI - Continue treatment and Cortrak - Monitor for progress and improvement - Ongoing goals of care discussions  Code Status/Advance Care Planning: DNR   Symptom Management:  Per attending.   Prognosis:  Overall prognosis poor.   Discharge Planning: To Be Determined      Primary Diagnoses: Present on Admission:  Influenza A H1N1 infection  Acute renal failure superimposed on stage 3b chronic kidney disease (HCC)  History of pulmonary embolus (PE)  Benign essential HTN  Rhabdomyolysis  Influenza A   I have reviewed the medical record, interviewed the patient and family, and examined the patient. The following aspects are pertinent.  Past Medical History:  Diagnosis Date   Acute cystitis 03/11/2023   Acute respiratory failure with hypoxia (HCC) 03/11/2023   AKI (acute kidney injury) (HCC)    due to NSAID   Arthritis    Closed fracture of left proximal humerus 09/14/2017   Constipation    DVT (deep venous thrombosis) (HCC)    Herniated disc    History of hemorrhoids 12/25/2017   History of pulmonary embolism 03/11/2023   Hypertension    Hypertensive retinopathy, bilateral 07/13/2023   Pulmonary embolism (HCC)    Seasonal allergies    Spinal stenosis     Stomach ulcer    due to NSAID   Tachycardia    TIA (transient ischemic attack) 09/09/2020   Tributary (branch) retinal vein occlusion, right eye, with macular edema 07/13/2023   UTI (lower urinary tract infection)    Vitamin D deficiency 09/14/2017   Weight loss 03/19/2021   Social History   Socioeconomic History   Marital status: Single    Spouse name: Not on file   Number of children: 4   Years of education: Not on file   Highest education level: Not on file  Occupational History   Not on file  Tobacco Use   Smoking status: Never   Smokeless tobacco: Never  Vaping Use   Vaping status: Never Used  Substance and Sexual Activity   Alcohol use: No   Drug use: No   Sexual activity: Never  Other Topics Concern   Not on file  Social History Narrative   Born: [place], [place]   Lives with son, Beverely Pace.            Social Drivers of Corporate investment banker Strain: Low Risk  (03/24/2023)   Overall Financial Resource Strain (CARDIA)    Difficulty of Paying Living Expenses: Not hard at all  Food Insecurity: No Food Insecurity (07/13/2023)   Hunger Vital Sign    Worried About Running Out of Food in the Last Year: Never true    Ran Out of Food in the Last Year: Never true  Transportation Needs: No Transportation Needs (07/13/2023)   PRAPARE - Administrator, Civil Service (Medical): No    Lack of Transportation (Non-Medical): No  Physical Activity: Inactive (03/24/2023)   Exercise Vital Sign    Days of Exercise per Week: 0 days    Minutes of Exercise per Session: 0 min  Stress: No Stress Concern Present (03/24/2023)   [employer] of Occupational Health - Occupational Stress Questionnaire    Feeling of Stress : Not at all  Social Connections: Unknown (07/13/2023)   Social Connection and Isolation Panel [NHANES]    Frequency of Communication with Friends and Family: Twice a week    Frequency of Social Gatherings with Friends and Family: Twice a week     Attends Religious Services: 1 to 4 times per year    Active Member of [employer] or Organizations: Yes    Attends Banker Meetings: Never    Marital Status: Patient declined   Family History  Problem Relation Age of Onset   Arthritis Mother    Heart attack Mother    Scheduled Meds:  amLODipine  2.5 mg  Oral Daily   apixaban  5 mg Oral BID   feeding supplement (PROSource TF20)  60 mL Per Tube Daily   fesoterodine  4 mg Oral Daily   free water  150 mL Per Tube Q6H   mirabegron ER  25 mg Oral q AM   pantoprazole  40 mg Oral Daily   potassium chloride  40 mEq Per Tube Once   thiamine  100 mg Per Tube Daily   Continuous Infusions:  feeding supplement (OSMOLITE 1.2 CAL)     PRN Meds:.acetaminophen **OR** acetaminophen, bisacodyl, ondansetron **OR** ondansetron (ZOFRAN) IV, polyethylene glycol Allergies  Allergen Reactions   Nsaids Other (See Comments)    "NSAIDS WILL CAUSE BLEEDING ULCERS"- do NOT give   Penicillin G Rash   Penicillins Rash and Other (See Comments)    Cephalosporin tolerant given during surgery 08/2017   Review of Systems  Constitutional:  Positive for activity change, appetite change and fatigue.  HENT:  Positive for trouble swallowing.   Neurological:  Positive for weakness.    Physical Exam Vitals and nursing note reviewed.  Constitutional:      General: She is not in acute distress.    Appearance: She is ill-appearing.     Comments: Thin, frail   Cardiovascular:     Rate and Rhythm: Normal rate.  Pulmonary:     Effort: No tachypnea, accessory muscle usage or respiratory distress.  Abdominal:     General: Abdomen is flat.  Neurological:     Mental Status: She is alert and oriented to person, place, and time.     Comments: Hard of hearing Often refers to her son for decisions/discussion     Vital Signs: BP (!) 146/94   Pulse 86   Temp 98 F (36.7 C)   Resp 18   Ht 5\' 5"  (1.651 m)   Wt 58.2 kg   SpO2 97%   BMI 21.35 kg/m  Pain  Scale: 0-10   Pain Score: 10-Worst pain ever   SpO2: SpO2: 97 % O2 Device:SpO2: 97 % O2 Flow Rate: .O2 Flow Rate (L/min): 2 L/min  IO: Intake/output summary:  Intake/Output Summary (Last 24 hours) at 07/19/2023 1229 Last data filed at 07/19/2023 0500 Gross per 24 hour  Intake 490.51 ml  Output 1200 ml  Net -709.49 ml    LBM: Last BM Date :  (PTA) Baseline Weight: Weight: 47.2 kg Most recent weight: Weight: 58.2 kg     Palliative Assessment/Data:    Time Total: 75 min  Greater than 50%  of this time was spent counseling and coordinating care related to the above assessment and plan.  Signed by: Yong Channel, NP Palliative Medicine Team Pager # [phone] (M-F 8a-5p) Team Phone # [phone] (Nights/Weekends)

## 2023-07-20 DIAGNOSIS — Z7189 Other specified counseling: Secondary | ICD-10-CM | POA: Diagnosis not present

## 2023-07-20 DIAGNOSIS — R1313 Dysphagia, pharyngeal phase: Secondary | ICD-10-CM | POA: Diagnosis not present

## 2023-07-20 DIAGNOSIS — J101 Influenza due to other identified influenza virus with other respiratory manifestations: Secondary | ICD-10-CM | POA: Diagnosis not present

## 2023-07-20 DIAGNOSIS — Z515 Encounter for palliative care: Secondary | ICD-10-CM | POA: Diagnosis not present

## 2023-07-20 LAB — BASIC METABOLIC PANEL
Anion gap: 10 (ref 5–15)
BUN: 16 mg/dL (ref 8–23)
CO2: 25 mmol/L (ref 22–32)
Calcium: 9.7 mg/dL (ref 8.9–10.3)
Chloride: 113 mmol/L — ABNORMAL HIGH (ref 98–111)
Creatinine, Ser: 0.83 mg/dL (ref 0.44–1.00)
GFR, Estimated: >60 mL/min (ref >60)
Glucose, Bld: 90 mg/dL (ref 70–99)
Potassium: 5.1 mmol/L (ref 3.5–5.1)
Sodium: 148 mmol/L — ABNORMAL HIGH (ref 135–145)

## 2023-07-20 LAB — GLUCOSE, CAPILLARY
Glucose-Capillary: 78 mg/dL (ref 70–99)
Glucose-Capillary: 85 mg/dL (ref 70–99)
Glucose-Capillary: 82 mg/dL (ref 70–99)
Glucose-Capillary: 110 mg/dL — ABNORMAL HIGH (ref 70–99)
Glucose-Capillary: 113 mg/dL — ABNORMAL HIGH (ref 70–99)

## 2023-07-20 LAB — MAGNESIUM: Magnesium: 2.2 mg/dL (ref 1.7–2.4)

## 2023-07-20 MED ORDER — DEXTROSE 5 % IV SOLN: INTRAVENOUS | Status: DC

## 2023-07-20 NOTE — Plan of Care (Signed)

## 2023-07-20 NOTE — Progress Notes (Signed)
 Palliative:  ***  Yong Channel, NP Palliative Medicine Team Pager [phone] (Please see [email or web address] for schedule) Team Phone [phone]

## 2023-07-20 NOTE — TOC Progression Note (Signed)
 Transition of Care [facility]) - Progression Note    Patient Details  Name: Becky Leach MRN: [id number] Date of Birth: 1929/06/08  Transition of Care [facility]) CM/SW Contact  Harriet Masson, RN Phone Number: 07/20/2023, 2:05 PM  Clinical Narrative:    Palliative following for GOC.    Expected Discharge Plan: Skilled Nursing Facility Barriers to Discharge: Continued Medical Work up  Expected Discharge Plan and Services   Discharge Planning Services: CM Consult Post Acute Care Choice: Home Health Living arrangements for the past 2 months: Single Family Home                           HH Arranged: PT, OT, RN [facility] Agency: [facility] Date [facility] Agency Contacted: 07/14/23 Time HH Agency Contacted: 1425 Representative spoke with at [facility] Agency: Haywood Lasso   Social Determinants of Health (SDOH) Interventions SDOH Screenings   Food Insecurity: No Food Insecurity (07/13/2023)  Housing: Low Risk  (07/13/2023)  Transportation Needs: No Transportation Needs (07/13/2023)  Utilities: Not At Risk (07/13/2023)  Alcohol Screen: Low Risk  (03/24/2023)  Depression (PHQ2-9): Medium Risk (05/05/2023)  Financial Resource Strain: Low Risk  (03/24/2023)  Physical Activity: Inactive (03/24/2023)  Social Connections: Unknown (07/13/2023)  Stress: No Stress Concern Present (03/24/2023)  Tobacco Use: Low Risk  (07/13/2023)  Health Literacy: Adequate Health Literacy (03/24/2023)    Readmission Risk Interventions     No data to display

## 2023-07-20 NOTE — Plan of Care (Signed)

## 2023-07-20 NOTE — Progress Notes (Signed)
 Mobility Specialist Progress Note:   07/20/23 1417  Mobility  Activity Transferred from chair to bed  Level of Assistance Contact guard assist, steadying assist  Assistive Device Front wheel walker  Distance Ambulated (ft) 15 ft  Activity Response Tolerated well  Mobility Referral Yes  Mobility visit 1 Mobility  Mobility Specialist Start Time (ACUTE ONLY) 1350  Mobility Specialist Stop Time (ACUTE ONLY) 1410  Mobility Specialist Time Calculation (min) (ACUTE ONLY) 20 min   Pt received in chair, requesting to return to bed. CG to stand and ambulate short distance around bed with RW. No complaints stated during session. Pt left in bed with call bell in reach and all needs met.   Leory Plowman  Mobility Specialist Please contact via [employer] office at [phone]

## 2023-07-20 NOTE — Progress Notes (Signed)
 Physical Therapy Treatment Patient Details Name: Becky Leach MRN: [id number] DOB: July 14, 1929 Today's Date: 07/20/2023   History of Present Illness The pt is a 89 yo female presenting 2/13 after being found on the floor of he room with hematoma to her head and a skin tear on her R arm. Work up revealed influenza A, no acute bony injury. PMH includes: spinal stenosis, unprovoked PE/DVT, hematuria (taken off of AC), arthritis, constipation, herniated disc, hypertension, stomach ulcer, vitamin D deficiency, UTI.    PT Comments  The pt continues to make slow progress towards goals, she presents with more fatigue this morning, perseverating on lack of food. I attempted to educate her on function of cortrack with little retention. The pt initially agreed only to short bout of mobility (~5 ft) but with continued encouragement completed additional 20 ft ambulation in room with use of RW. Limited by pt fatigue and incontinence. Pt reports her goal is to be able to ambulate "a little farther" and with less assistance. Continue to recommend skilled PT to progress functional independence with mobility and return to maximal level of activity tolerance and independence.    If plan is discharge home, recommend the following: Assistance with cooking/housework;Direct supervision/assist for medications management;Direct supervision/assist for financial management;A lot of help with walking and/or transfers;A lot of help with bathing/dressing/bathroom;Assist for transportation;Help with stairs or ramp for entrance;Supervision due to cognitive status   Can travel by private vehicle     Yes  Equipment Recommendations  None recommended by PT (pt has needed DME)    Recommendations for Other Services       Precautions / Restrictions Precautions Precautions: Fall Recall of Precautions/Restrictions: Impaired Precaution/Restrictions Comments: cortrak Restrictions Weight Bearing Restrictions Per Provider Order: No      Mobility  Bed Mobility Overal bed mobility: Needs Assistance Bed Mobility: Supine to Sit     Supine to sit: HOB elevated, Used rails, Min assist     General bed mobility comments: minA to trunk, pt completed with increased time and use of bed features    Transfers Overall transfer level: Needs assistance Equipment used: Rolling walker (2 wheels) Transfers: Sit to/from Stand, Bed to chair/wheelchair/BSC Sit to Stand: Min assist           General transfer comment: minA to complete with RW from EOB, assist to power up on first attempt, then CGA with armrest from recliner.    Ambulation/Gait Ambulation/Gait assistance: Min assist, Contact guard assist Gait Distance (Feet): 5 Feet (+ 20) Assistive device: Rolling walker (2 wheels) Gait Pattern/deviations: Step-through pattern, Decreased stride length, Shuffle, Trunk flexed, Narrow base of support, Step-to pattern Gait velocity: decreased Gait velocity interpretation: <1.31 ft/sec, indicative of household ambulator   General Gait Details: small shuffling steps with head and trunk flexed. more assist to steady and manage RW, significant trunk flesion. incontinent of urine throughout       Balance Overall balance assessment: Needs assistance Sitting-balance support: No upper extremity supported, Feet supported Sitting balance-Leahy Scale: Fair     Standing balance support: Bilateral upper extremity supported, During functional activity, Reliant on assistive device for balance Standing balance-Leahy Scale: Poor Standing balance comment: BUE support for gait with single UE support at sink without LOB                            Communication Communication Communication: No apparent difficulties  Cognition Arousal: Alert Behavior During Therapy: Flat affect   PT - Cognitive  impairments: Orientation, Memory, Attention, Sequencing, Problem solving, Safety/Judgement, Initiation   Orientation impairments:  Time                   PT - Cognition Comments: decreased awareness and problem solving, needing more cues. soiled of urine and not aware or asking for assist Following commands: Impaired Following commands impaired: Follows one step commands with increased time    Cueing Cueing Techniques: Verbal cues, Gestural cues  Exercises      General Comments General comments (skin integrity, edema, etc.): SpO2 96% on 4L upon arrival, also 96% on 2L with ambulation      Pertinent Vitals/Pain Pain Assessment Pain Assessment: No/denies pain Pain Intervention(s): Monitored during session     PT Goals (current goals can now be found in the care plan section) Acute Rehab PT Goals Patient Stated Goal: to return home PT Goal Formulation: With patient Time For Goal Achievement: 07/28/23 Potential to Achieve Goals: Fair Progress towards PT goals: Progressing toward goals    Frequency    Min 1X/week       AM-PAC PT "6 Clicks" Mobility   Outcome Measure  Help needed turning from your back to your side while in a flat bed without using bedrails?: A Little Help needed moving from lying on your back to sitting on the side of a flat bed without using bedrails?: A Little Help needed moving to and from a bed to a chair (including a wheelchair)?: A Lot Help needed standing up from a chair using your arms (e.g., wheelchair or bedside chair)?: A Lot Help needed to walk in hospital room?: A Lot Help needed climbing 3-5 steps with a railing? : A Lot 6 Click Score: 14    End of Session Equipment Utilized During Treatment: Gait belt;Oxygen Activity Tolerance: Patient limited by fatigue Patient left: in chair;with call bell/phone within reach;with chair alarm set Nurse Communication: Mobility status PT Visit Diagnosis: Unsteadiness on feet (R26.81);Other abnormalities of gait and mobility (R26.89);Muscle weakness (generalized) (M62.81)     Time: 0981-1914 PT Time Calculation (min)  (ACUTE ONLY): 33 min  Charges:    $Therapeutic Exercise: 8-22 mins $Therapeutic Activity: 8-22 mins PT General Charges $$ ACUTE PT VISIT: 1 Visit                     Vickki Muff, PT, DPT   Acute Rehabilitation Department Office [phone] Secure Chat Communication Preferred   Ronnie Derby 07/20/2023, 12:38 PM

## 2023-07-20 NOTE — Progress Notes (Signed)
 Nutrition Follow-up  DOCUMENTATION CODES:   Severe malnutrition in context of social or environmental circumstances, Underweight  INTERVENTION: Cortrak Osmolite 1.2 at 50 ml/h (1200 ml per day)   Prosource TF20 60 ml daily   FWF; 150 every 6 hours.  Provides 1520 kcal, 88 gm protein, 1560 ml free water daily   Monitor magnesium, potassium, and phosphorus BID for at least 3 days, MD to replete as needed, as pt is at risk for refeeding syndrome given poor oral intake.  Thiamine 100 mg daily for 7 days   NUTRITION DIAGNOSIS:   Severe Malnutrition related to social / environmental circumstances as evidenced by severe muscle depletion, severe fat depletion.    GOAL:   Patient will meet greater than or equal to 90% of their needs  Pa  MONITOR:   TF tolerance  REASON FOR ASSESSMENT:   Consult Enteral/tube feeding initiation and management  ASSESSMENT:   89 y.o. F, presented to ED after family found down leaning against her bed, in the morning. Patient reports that she is not sure how long down for.   medical history significant of spinal stenosis, unprovoked PE/DVT, hematuria (taken off of AC), arthritis, constipation, herniated disc, hypertension, stomach ulcer, vitamin D deficiency, UTI.  Patient sleeping at time of visit, did not wake to RD voice or touch.  No family at bedside. Review of chart revealed Palliative care following.  Per PC RN she is alert to where she is, does understand that her Cortrak is how she is receiving nutrition at this time. SLP following. She appears to be tolerating feedings well. Weight trending stable. Family GOC not established yet. PEG has been discussed with no commitment.  Suspect patient is not a good candidate for PEG placement in relation to advanced age, general weakness, and un likeliness that it would improve patients quality of life.    Meds: eliquis, Norvasc, thiamine, PRN: DULCOLAX, [facility] weight history: 07/20/23 0500  58 kg 127.87 lbs  07/19/23 0500 58.2 kg 128.31 lbs  07/13/23 10:05:50 47.2 kg 104 lbs   NUTRITION - FOCUSED PHYSICAL EXAM:  Flowsheet Row Most Recent Value  Orbital Region Severe depletion  Upper Arm Region Severe depletion  Thoracic and Lumbar Region Severe depletion  Buccal Region Severe depletion  Temple Region Severe depletion  Clavicle Bone Region Severe depletion  Clavicle and Acromion Bone Region Severe depletion  Scapular Bone Region Severe depletion  Dorsal Hand Severe depletion  Patellar Region Severe depletion  Anterior Thigh Region Severe depletion  Posterior Calf Region Severe depletion  Edema (RD Assessment) None  Hair Reviewed  Eyes Reviewed  Mouth Reviewed  Skin Reviewed  Nails Reviewed       Diet Order:   Diet Order             Diet NPO time specified  Diet effective now                   EDUCATION NEEDS:   Not appropriate for education at this time  Skin:  Skin Assessment: Reviewed RN Assessment  Last BM:  PTA  Height:   Ht Readings from Last 1 Encounters:  07/13/23 5\' 5"  (1.651 m)    Weight:   Wt Readings from Last 1 Encounters:  07/20/23 58 kg    Ideal Body Weight:     BMI:  Body mass index is 21.28 kg/m.  Estimated Nutritional Needs:   Kcal:  1420-1675 kcal  Protein:  60-80 g  Fluid:  >/=  Jamelle Haring RDN, LDN Clinical Dietitian   If unable to reach, please contact "RD Inpatient" secure chat group between 8 am-4 pm daily"

## 2023-07-20 NOTE — Progress Notes (Signed)
 PROGRESS NOTE    Becky Leach  VOZ:[id number] DOB: [birth year] DOA: 07/13/2023 PCP: Erick Alley, DO   Brief Narrative:  89 y.o. female with medical history significant of spinal stenosis, unprovoked PE/DVT, hematuria (taken off of AC), arthritis, constipation, herniated disc, hypertension, stomach ulcer, vitamin D deficiency, UTI was found to be lying on the floor by her family member. She was noted to have influenza. She was hospitalized for further management.  Hospitalized complicated by poor oral intake and severe dysphagia.  Palliative care consulted.  Assessment & Plan:   Acute influenza  Acute respiratory failure with hypoxia -treated with Tamiflu.  Respiratory status is stable.   -Still 2 L oxygen by nasal cannula intermittently.  Wean off as able.   Oropharyngeal dysphagia -Underwent swallow evaluation on 2/17 with MBS which suggested severe dysphagia and high aspiration risk.  N.p.o. status was recommended by SLP. Conversations held by prior hospitalist with patient and her son.  She does not have any focal neurological deficits.  CT head done recently did not show any acute findings.  Her dysphagia could just be secondary to profound weakness from acute illness.  GI evaluation appreciated: GI signed off on 07/19/2023.  Family still deciding best course of action. -Patient's son wants to try tube feedings to see if there is any improvement in the next few days.  cortrak placed on 07/19/2023 and feeding started. Swallow function to be reassessed in a few days.   -Palliative care following.   Frequent falls -Patient with history of frequent falls.  -PT and OT recommending SNF.  TOC is following.   History of pulmonary embolus (PE) The patient was placed on eliquis for this. She was subsequently taken off of it due to hematuria, but was on it again at the time of presentation. Continue with apixaban for now.  No evidence of overt bleeding.  Hemoglobin is stable.    Rhabdomyolysis Treated with IV fluids with improvement in CK levels.     Acute renal failure superimposed on stage 3b chronic kidney disease (HCC) Renal function has improved.    Hypokalemia -Improved  Hypernatremia -Possibly from dehydration.  Continue IV fluids.  Continue free water via tube.  Hypertension -Continue amlodipine.  Blood pressure intermittently on the higher side.   DVT Prophylaxis: On apixaban Code Status: Full code Family Communication: None at bedside.  Disposition Plan: Status is: Inpatient Remains inpatient appropriate because: Of severity of illness  Consultants: Palliative care/GI.  Procedures: None  Antimicrobials: None   Subjective: Patient seen and examined at bedside.  No fever, seizures, vomiting or agitation reported. Objective: Vitals:   07/19/23 1950 07/20/23 0100 07/20/23 0409 07/20/23 0500  BP: 136/86 (!) 147/95 (!) 166/106   Pulse: 88 75 88   Resp: 16 16 16    Temp: 98 F (36.7 C) 97.6 F (36.4 C) 97.6 F (36.4 C)   TempSrc: Oral Oral    SpO2: 100% 98% 99%   Weight:    58 kg  Height:       No intake or output data in the 24 hours ending 07/20/23 0706  Filed Weights   07/13/23 1005 07/19/23 0500 07/20/23 0500  Weight: 47.2 kg 58.2 kg 58 kg    Examination:  General: Currently on room air.  No distress ENT/neck: No thyromegaly.  JVD is not elevated. Cortrak tube in place respiratory: Decreased breath sounds at bases bilaterally with some crackles; no wheezing  CVS: S1-S2 heard, rate controlled currently Abdominal: Soft, nontender, slightly distended; no organomegaly,  bowel sounds are heard Extremities: Trace lower extremity edema; no cyanosis  CNS: Wakes up slightly, extremely slow to respond.  Poor historian.  No focal neurologic deficit.  Moves extremities Lymph: No obvious lymphadenopathy Skin: No obvious ecchymosis/lesions  psych: Extremely flat affect.  Currently not agitated.   Musculoskeletal: No obvious joint  swelling/deformity     Data Reviewed: I have personally reviewed following labs and imaging studies  CBC: Recent Labs  Lab 07/13/23 1115 07/14/23 0505 07/15/23 0446 07/16/23 0335 07/17/23 0436 07/19/23 0349  WBC 3.9* 6.1 6.2 7.0 8.5 5.1  NEUTROABS 2.6  --  4.5 5.3 6.9  --   HGB 14.5 15.6* 14.3 15.4* 15.0 14.5  HCT 46.0 45.9 42.0 44.1 43.5 43.9  MCV 92.2 84.7 83.2 82.9 83.0 86.4  PLT 170 179 184 167 179 224   Basic Metabolic Panel: Recent Labs  Lab 07/15/23 0446 07/16/23 0335 07/17/23 0436 07/19/23 0349 07/20/23 0333  NA 137 139 142 145 148*  K 3.6 3.9 3.8 3.4* 5.1  CL 104 101 106 110 113*  CO2 21* 23 26 26 25   GLUCOSE 80 106* 122* 101* 90  BUN 19 17 17 15 16   CREATININE 1.24* 1.23* 1.19* 0.73 0.83  CALCIUM 8.7* 9.4 9.2 9.2 9.7  MG  --   --   --  2.1 2.2  PHOS  --   --   --  2.2*  --    GFR: Estimated Creatinine Clearance: 38.9 mL/min (by C-G formula based on SCr of 0.83 mg/dL). Liver Function Tests: Recent Labs  Lab 07/13/23 1044 07/14/23 0505  AST 66* 79*  ALT 38 42  ALKPHOS 76 70  BILITOT 1.0 1.4*  PROT 7.0 6.8  ALBUMIN 3.7 3.4*   No results for input(s): "LIPASE", "AMYLASE" in the last 168 hours. No results for input(s): "AMMONIA" in the last 168 hours. Coagulation Profile: No results for input(s): "INR", "PROTIME" in the last 168 hours. Cardiac Enzymes: Recent Labs  Lab 07/13/23 1044 07/13/23 1512 07/14/23 0504 07/15/23 0446  CKTOTAL 709* 968* 1,188* 536*   BNP (last 3 results) No results for input(s): "PROBNP" in the last 8760 hours. HbA1C: No results for input(s): "HGBA1C" in the last 72 hours. CBG: Recent Labs  Lab 07/19/23 0513 07/19/23 0954 07/19/23 1149 07/19/23 1618 07/20/23 0412  GLUCAP 90 101* 112* 73 78   Lipid Profile: No results for input(s): "CHOL", "HDL", "LDLCALC", "TRIG", "CHOLHDL", "LDLDIRECT" in the last 72 hours. Thyroid Function Tests: No results for input(s): "TSH", "T4TOTAL", "FREET4", "T3FREE",  "THYROIDAB" in the last 72 hours. Anemia Panel: No results for input(s): "VITAMINB12", "FOLATE", "FERRITIN", "TIBC", "IRON", "RETICCTPCT" in the last 72 hours. Sepsis Labs: No results for input(s): "PROCALCITON", "LATICACIDVEN" in the last 168 hours.  Recent Results (from the past 240 hours)  Resp panel by RT-PCR (RSV, Flu A&B, Covid) Anterior Nasal Swab     Status: Abnormal   Collection Time: 07/13/23 10:25 AM   Specimen: Anterior Nasal Swab  Result Value Ref Range Status   SARS Coronavirus 2 by RT PCR NEGATIVE NEGATIVE Final   Influenza A by PCR POSITIVE (A) NEGATIVE Final   Influenza B by PCR NEGATIVE NEGATIVE Final    Comment: (NOTE) The Xpert Xpress SARS-CoV-2/FLU/RSV plus assay is intended as an aid in the diagnosis of influenza from Nasopharyngeal swab specimens and should not be used as a sole basis for treatment. Nasal washings and aspirates are unacceptable for Xpert Xpress SARS-CoV-2/FLU/RSV testing.  Fact Sheet for Patients: BloggerCourse.com  Fact Sheet for  Healthcare Providers: SeriousBroker.it  This test is not yet approved or cleared by the [place] and has been authorized for detection and/or diagnosis of SARS-CoV-2 by FDA under an Emergency Use Authorization (EUA). This EUA will remain in effect (meaning this test can be used) for the duration of the COVID-19 declaration under Section 564(b)(1) of the Act, 21 U.S.C. section 360bbb-3(b)(1), unless the authorization is terminated or revoked.     Resp Syncytial Virus by PCR NEGATIVE NEGATIVE Final    Comment: (NOTE) Fact Sheet for Patients: BloggerCourse.com  Fact Sheet for Healthcare Providers: SeriousBroker.it  This test is not yet approved or cleared by the [place] FDA and has been authorized for detection and/or diagnosis of SARS-CoV-2 by FDA under an Emergency Use Authorization (EUA). This  EUA will remain in effect (meaning this test can be used) for the duration of the COVID-19 declaration under Section 564(b)(1) of the Act, 21 U.S.C. section 360bbb-3(b)(1), unless the authorization is terminated or revoked.  Performed at [facility], [address] [place], [place] [address]   Respiratory (~20 pathogens) panel by PCR     Status: Abnormal   Collection Time: 07/14/23  2:47 PM   Specimen: Nasopharyngeal Swab; Respiratory  Result Value Ref Range Status   Adenovirus NOT DETECTED NOT DETECTED Final   Coronavirus 229E NOT DETECTED NOT DETECTED Final    Comment: (NOTE) The Coronavirus on the Respiratory Panel, DOES NOT test for the novel  Coronavirus (2019 nCoV)    Coronavirus HKU1 NOT DETECTED NOT DETECTED Final   Coronavirus NL63 NOT DETECTED NOT DETECTED Final   Coronavirus OC43 NOT DETECTED NOT DETECTED Final   Metapneumovirus NOT DETECTED NOT DETECTED Final   Rhinovirus / Enterovirus NOT DETECTED NOT DETECTED Final   Influenza A H1 2009 DETECTED (A) NOT DETECTED Final   Influenza B NOT DETECTED NOT DETECTED Final   Parainfluenza Virus 1 NOT DETECTED NOT DETECTED Final   Parainfluenza Virus 2 NOT DETECTED NOT DETECTED Final   Parainfluenza Virus 3 NOT DETECTED NOT DETECTED Final   Parainfluenza Virus 4 NOT DETECTED NOT DETECTED Final   Respiratory Syncytial Virus NOT DETECTED NOT DETECTED Final   Bordetella pertussis NOT DETECTED NOT DETECTED Final   Bordetella Parapertussis NOT DETECTED NOT DETECTED Final   Chlamydophila pneumoniae NOT DETECTED NOT DETECTED Final   Mycoplasma pneumoniae NOT DETECTED NOT DETECTED Final    Comment: Performed at [facility], [address], [place], [place] [address]         Radiology Studies: DG Abd Portable 1V Result Date: 07/19/2023 CLINICAL DATA:  Enteric catheter placement EXAM: PORTABLE ABDOMEN - 1 VIEW COMPARISON:  07/13/2023 FINDINGS: Frontal view of the lower chest and upper abdomen demonstrates enteric  catheter projecting over the known large hiatal hernia containing the majority of the stomach, as seen on previous CT. Persistent consolidation at the lung bases, right greater than left. Oral contrast seen within the colon. IMPRESSION: 1. Enteric catheter, tip projecting over the large hiatal hernia containing the majority of the stomach as seen on recent CT. Electronically Signed   By: Sharlet Salina M.D.   On: 07/19/2023 09:09        Scheduled Meds:  amLODipine  2.5 mg Per Tube Daily   apixaban  5 mg Per Tube BID   feeding supplement (PROSource TF20)  60 mL Per Tube Daily   free water  150 mL Per Tube Q6H   pantoprazole (PROTONIX) IV  40 mg Intravenous Daily  thiamine  100 mg Per Tube Daily   Continuous Infusions:  feeding supplement (OSMOLITE 1.2 CAL)            Glade Lloyd, MD [facility] 07/20/2023, 7:06 AM

## 2023-07-20 NOTE — Progress Notes (Signed)
 Speech Language Pathology Treatment: Dysphagia  Patient Details Name: Becky Leach MRN: [id number] DOB: 1930/03/28 Today's Date: 07/20/2023 Time: 7829-5621 SLP Time Calculation (min) (ACUTE ONLY): 18 min  Assessment / Plan / Recommendation Clinical Impression  Pt received sitting upright in chair, alert. She is eager to try sips of water.  She continues to present with overt s/s of aspiration with sips of thin water including multiple swallows and delayed, nonproductive, congested coughing. Minimal coughing noted with small amounts of ice chips. We discussed repeat MBSS to objectively assess pt's swallow function and determine if there is a safe consistency pt can have at this time. Per notes, Palliative care has facilitated discussion re: pursuing a comfort diet vs continuing tube feeds via PEG. A repeat MBSS may offer more information for Becky Leach and her family.   Recommend continue NPO with the exception of ice chips for oral comfort.   MBSS anticipated Friday, 2/21.    HPI HPI: The pt is a 89 yo female presenting 2/13 after being found on the floor of he room with hematoma to her head and a skin tear on her R arm. Work up revealed influenza A, no acute bony injury. PMH includes: spinal stenosis, unprovoked PE/DVT, hematuria (taken off of AC), arthritis, constipation, herniated disc, hypertension, stomach ulcer, vitamin D deficiency, UTI; Prior MBS completed on 10/24 with recommendations for Dysphagia 3/thin ilquids, but noted a prominent CP bar/spinal stensos.  Swallow evaluation on 07/16/23 with recommendations for NPO d/t overt s/s of aspiration.  MBS completed 2/17 with severe dysphagia noted and continue NPO recommended.      SLP Plan  Continue with current plan of care      Recommendations for follow up therapy are one component of a multi-disciplinary discharge planning process, led by the attending physician.  Recommendations may be updated based on patient status, additional  functional criteria and insurance authorization.    Recommendations  Diet recommendations: NPO (can have ice chips) Medication Administration: Via alternative means                  Oral care QID     Dysphagia, pharyngeal phase (R13.13)     Continue with current plan of care     Ellery Plunk  07/20/2023, 3:57 PM

## 2023-07-21 ENCOUNTER — Inpatient Hospital Stay (HOSPITAL_COMMUNITY): Payer: Medicare Other

## 2023-07-21 DIAGNOSIS — J101 Influenza due to other identified influenza virus with other respiratory manifestations: Secondary | ICD-10-CM | POA: Diagnosis not present

## 2023-07-21 LAB — GLUCOSE, CAPILLARY
Glucose-Capillary: 99 mg/dL (ref 70–99)
Glucose-Capillary: 151 mg/dL — ABNORMAL HIGH (ref 70–99)
Glucose-Capillary: 113 mg/dL — ABNORMAL HIGH (ref 70–99)

## 2023-07-21 LAB — BASIC METABOLIC PANEL
Anion gap: 19 — ABNORMAL HIGH (ref 5–15)
BUN: 31 mg/dL — ABNORMAL HIGH (ref 8–23)
CO2: 17 mmol/L — ABNORMAL LOW (ref 22–32)
Calcium: 10.2 mg/dL (ref 8.9–10.3)
Chloride: 111 mmol/L (ref 98–111)
Creatinine, Ser: 1.29 mg/dL — ABNORMAL HIGH (ref 0.44–1.00)
GFR, Estimated: 39 mL/min — ABNORMAL LOW (ref >60)
Glucose, Bld: 119 mg/dL — ABNORMAL HIGH (ref 70–99)
Potassium: 3.8 mmol/L (ref 3.5–5.1)
Sodium: 147 mmol/L — ABNORMAL HIGH (ref 135–145)

## 2023-07-21 LAB — MAGNESIUM: Magnesium: 2.3 mg/dL (ref 1.7–2.4)

## 2023-07-21 MED ORDER — LACTATED RINGERS IV BOLUS
500.0000 mL | Freq: Once | INTRAVENOUS | Status: AC
Start: 2023-07-21 — End: 2023-07-21
  Administered 2023-07-21: 500 mL via INTRAVENOUS

## 2023-07-21 MED ORDER — DEXTROSE 5 % IV SOLN: INTRAVENOUS | Status: DC

## 2023-07-21 MED ORDER — SODIUM CHLORIDE 0.9 % IV SOLN: 2.0000 g | INTRAVENOUS | Status: DC

## 2023-07-21 MED ORDER — OSMOLITE 1.2 CAL PO LIQD
1000.0000 mL | ORAL | Status: DC
  Administered 2023-07-21 (×2): 1000 mL
  Filled 2023-07-21 (×2): qty 1000

## 2023-07-21 MED ORDER — LACTATED RINGERS IV BOLUS: 1000.0000 mL | Freq: Once | INTRAVENOUS | Status: DC

## 2023-07-21 MED ORDER — PROSOURCE TF20 ENFIT COMPATIBL EN LIQD
60.0000 mL | Freq: Two times a day (BID) | ENTERAL | Status: DC
  Administered 2023-07-21: 60 mL
  Filled 2023-07-21: qty 60

## 2023-07-21 MED ORDER — ORAL CARE MOUTH RINSE
15.0000 mL | OROMUCOSAL | Status: DC
  Administered 2023-07-21 (×2): 15 mL via OROMUCOSAL

## 2023-07-21 MED ORDER — ORAL CARE MOUTH RINSE: 15.0000 mL | OROMUCOSAL | Status: DC | PRN

## 2023-07-21 MED ORDER — LACTATED RINGERS IV BOLUS
500.0000 mL | Freq: Once | INTRAVENOUS | Status: AC
  Administered 2023-07-21: 500 mL via INTRAVENOUS

## 2023-07-21 MED ORDER — METRONIDAZOLE 500 MG/100ML IV SOLN: 500.0000 mg | Freq: Two times a day (BID) | INTRAVENOUS | Status: DC

## 2023-07-21 NOTE — Plan of Care (Signed)
   Problem: Health Behavior/Discharge Planning: Goal: Ability to manage health-related needs will improve Outcome: Progressing   Problem: Nutrition: Goal: Adequate nutrition will be maintained Outcome: Progressing

## 2023-07-21 NOTE — Plan of Care (Signed)

## 2023-07-22 NOTE — Progress Notes (Signed)
 Death Summary  Becky Leach:[id number] DOB: 1930/01/26 DOA: 2023/07/23  PCP: Erick Alley, DO  Admit date: 23-Jul-2023 Date of Death: 07/31/23 Time of Death: 08-09-2298   History of present illness:  89 y.o. female with medical history significant of spinal stenosis, unprovoked PE/DVT, hematuria (taken off of AC), arthritis, constipation, herniated disc, hypertension, stomach ulcer, vitamin D deficiency, UTI was found to be lying on the floor by her family member. She was noted to have influenza. She was hospitalized for further management.  Hospitalized complicated by poor oral intake and severe dysphagia.  Palliative care consulted. Cortrak placed on 07/19/2023 and feeding started. Her respiratory status worsened on 2/21/205 with hypotension and bradycardia. She passed away on 07/31/2023 at 2300; family was at bedside  Final Diagnoses:  Acute influenza  Acute respiratory failure with hypoxia Oropharyngeal dysphagia  Frequent falls  History of pulmonary embolus (PE)  Rhabdomyolysis  Acute renal failure superimposed on stage 3b chronic kidney disease  Hypokalemia  Hypernatremia  Acute metabolic acidosis  Hypertension   The results of significant diagnostics from this hospitalization (including imaging, microbiology, ancillary and laboratory) are listed below for reference.    Significant Diagnostic Studies: DG CHEST PORT 1 VIEW Result Date: 07-31-2023 CLINICAL DATA:  Hypoxia. EXAM: PORTABLE CHEST 1 VIEW COMPARISON:  23-Jul-2023, 07/19/2023. FINDINGS: Heart is enlarged and the mediastinal contour stable. There is atherosclerotic calcification of the aorta. Patchy airspace disease is present at the lung bases bilaterally, greater on the right than on the left. No pleural effusion or pneumothorax is seen. The distal tip of a enteric tube overlies the left lung base, possibly within a known hiatal hernia and is similar in appearance to prior exam. No acute osseous abnormality is seen. IMPRESSION:  1. Patchy airspace disease at the lung bases, possible atelectasis or infiltrate. 2. Enteric tube terminates at the left lung base in the region of the known hiatal hernia and similar in appearance to the prior exam. Electronically Signed   By: Thornell Sartorius M.D.   On: 07-31-23 20:14   DG Abd Portable 1V Result Date: 07/19/2023 CLINICAL DATA:  Enteric catheter placement EXAM: PORTABLE ABDOMEN - 1 VIEW COMPARISON:  2023-07-23 FINDINGS: Frontal view of the lower chest and upper abdomen demonstrates enteric catheter projecting over the known large hiatal hernia containing the majority of the stomach, as seen on previous CT. Persistent consolidation at the lung bases, right greater than left. Oral contrast seen within the colon. IMPRESSION: 1. Enteric catheter, tip projecting over the large hiatal hernia containing the majority of the stomach as seen on recent CT. Electronically Signed   By: Sharlet Salina M.D.   On: 07/19/2023 09:09   DG Swallowing Func-Speech Pathology Result Date: 07/17/2023 Table formatting from the original result was not included. Modified Barium Swallow Study Patient Details Name: Becky Leach MRN: [id number] Date of Birth: [birth year] Today's Date: 07/17/2023 HPI/PMH: HPI: The pt is a 89 yo female presenting 2023-07-23 after being found on the floor of he room with hematoma to her head and a skin tear on her R arm. Work up revealed influenza A, no acute bony injury. PMH includes: spinal stenosis, unprovoked PE/DVT, hematuria (taken off of AC), arthritis, constipation, herniated disc, hypertension, stomach ulcer, vitamin D deficiency, UTI; Prior MBS completed on 10/24 with recommendations for Dysphagia 3/thin liquids, but noted a prominent CP bar/spinal stenosis.  Swallow evaluation on 07/16/23 with recommendations for NPO d/t overt s/s of aspiration.  ST f/u for MBS to determine safest diet/presence/severity of dysphagia.  Clinical Impression: Clinical Impression: Pt exhibits a severe pharyngeal  dysphagia c/b delay in the initiation of the swallow to the level of the valleculae (may be d/t presbyphagia), but partial epiglottic inversion, partial anterior hyoid movement and superior movement of thyroid resulted in decreased airway closure and silent/frank aspiration of thin/nectar-thick liquids with pt cough unsuccessful at removing all of aspirated material.  Compensatory strategies utilized, but unsuccessful included initiating a cough, using straw, and chin tuck.  Honey-thickened liquids via tsp given and penetrated into laryngeal vestibule, but squeezed out prior to aspiration occurring.  Puree/soft solids did not enter airway, but mod pharyngeal wall residue/vallecular residue and partial obstruction of flow into UES place pt at risk for aspiration with pharyngeal residue (although not seen on this study).  DIGEST Swallow Severity Rating*  Safety: 3  Efficiency:3  Overall Pharyngeal Swallow Severity: severe *The Dynamic Imaging Grade of Swallowing Toxicity is standardized for the head and neck cancer population, however, demonstrates promising clinical applications across populations to standardize the clinical rating of pharyngeal swallow safety and severity. Recommend NPO with consideration for palliative consult d/t severe pharyngeal dysphagia.  ST will f/u for education/diet tolerance in acute setting/potential for po intake. Factors that may increase risk of adverse event in presence of aspiration Rubye Oaks & Clearance Coots 2021): Factors that may increase risk of adverse event in presence of aspiration Rubye Oaks & Clearance Coots 2021): Poor general health and/or compromised immunity; Respiratory or GI disease; Reduced cognitive function; Frail or deconditioned Recommendations/Plan: Swallowing Evaluation Recommendations Swallowing Evaluation Recommendations Recommendations: NPO Medication Administration: Via alternative means Oral care recommendations: Oral care QID (4x/day) Treatment Plan Treatment Plan Treatment  recommendations: Therapy as outlined in treatment plan below Follow-up recommendations: Other (comment) (TBD) Functional status assessment: Patient has had a recent decline in their functional status and demonstrates the ability to make significant improvements in function in a reasonable and predictable amount of time. Treatment frequency: Min 2x/week Treatment duration: 1 week Interventions: Aspiration precaution training; Compensatory techniques; Patient/family education; Diet toleration management by SLP Recommendations Recommendations for follow up therapy are one component of a multi-disciplinary discharge planning process, led by the attending physician.  Recommendations may be updated based on patient status, additional functional criteria and insurance authorization. Assessment: Orofacial Exam: Orofacial Exam Oral Cavity - Dentition: Edentulous Orofacial Anatomy: WFL Oral Motor/Sensory Function: Generalized oral weakness Anatomy: Anatomy: Prominent cricopharyngeus; Other (Comment) (spinal stenosis; kyphotic) Boluses Administered: Boluses Administered Boluses Administered: Thin liquids (Level 0); Mildly thick liquids (Level 2, nectar thick); Moderately thick liquids (Level 3, honey thick); Puree; Solid  Oral Impairment Domain: Oral Impairment Domain Lip Closure: Interlabial escape, no progression to anterior lip (Initial swallow of thin only; subsequent swallows adequate) Tongue control during bolus hold: Cohesive bolus between tongue to palatal seal Bolus preparation/mastication: Slow prolonged chewing/mashing with complete recollection (likely d/t lack of dentition/edentulous state) Bolus transport/lingual motion: Brisk tongue motion Oral residue: Trace residue lining oral structures Location of oral residue : Tongue Initiation of pharyngeal swallow : Valleculae  Pharyngeal Impairment Domain: Pharyngeal Impairment Domain Soft palate elevation: Trace column of contrast or air between SP and PW Laryngeal  elevation: Partial superior movement of thyroid cartilage/partial approximation of arytenoids to epiglottic petiole Anterior hyoid excursion: Partial anterior movement Epiglottic movement: Partial inversion Laryngeal vestibule closure: Incomplete, narrow column air/contrast in laryngeal vestibule Pharyngeal stripping wave : Present - diminished Pharyngeal contraction (A/P view only): N/A Pharyngoesophageal segment opening: Partial distention/partial duration, partial obstruction of flow Tongue base retraction: Narrow column of contrast or air between tongue base and  PPW Pharyngeal residue: Collection of residue within or on pharyngeal structures Location of pharyngeal residue: Valleculae; Pharyngeal wall  Esophageal Impairment Domain: Esophageal Impairment Domain Esophageal clearance upright position: Esophageal retention with retrograde flow below pharyngoesophageal segment (PES) Pill: No data recorded Penetration/Aspiration Scale Score: Penetration/Aspiration Scale Score 3.  Material enters airway, remains ABOVE vocal cords and not ejected out: Moderately thick liquids (Level 3, honey thick) 7.  Material enters airway, passes BELOW cords and not ejected out despite cough attempt by patient: Thin liquids (Level 0); Mildly thick liquids (Level 2, nectar thick) Compensatory Strategies: Compensatory Strategies Compensatory strategies: Yes Straw: Ineffective Ineffective Straw: Thin liquid (Level 0) Multiple swallows: Ineffective Ineffective Multiple Swallows: Mildly thick liquid (Level 2, nectar thick) Chin tuck: Ineffective Ineffective Chin Tuck: Thin liquid (Level 0)   General Information: Caregiver present: No  Diet Prior to this Study: NPO   Temperature : Normal   Respiratory Status: WFL   Supplemental O2: Nasal cannula (2L)   History of Recent Intubation: No  Behavior/Cognition: Alert; Cooperative; Requires cueing Self-Feeding Abilities: Able to self-feed; Needs assist with self-feeding Baseline vocal  quality/speech: Hypophonia/low volume; Dysphonic Volitional Cough: Able to elicit Volitional Swallow: Able to elicit Exam Limitations: No limitations Goal Planning: Prognosis for improved oropharyngeal function: Fair Barriers to Reach Goals: Severity of deficits No data recorded No data recorded Consulted and agree with results and recommendations: Patient; Nurse Pain: Pain Assessment Pain Assessment: No/denies pain End of Session: Start Time:SLP Start Time (ACUTE ONLY): 1205 Stop Time: SLP Stop Time (ACUTE ONLY): 1217 Time Calculation:SLP Time Calculation (min) (ACUTE ONLY): 12 min Charges: SLP Evaluations $ SLP Speech Visit: 1 Visit SLP Evaluations $BSS Swallow: 1 Procedure $MBS Swallow: 1 Procedure SLP visit diagnosis: SLP Visit Diagnosis: Dysphagia, pharyngeal phase (R13.13) Past Medical History: Past Medical History: Diagnosis Date  Acute cystitis 03/11/2023  Acute respiratory failure with hypoxia (HCC) 03/11/2023  AKI (acute kidney injury) (HCC)   due to NSAID  Arthritis   Closed fracture of left proximal humerus 09/14/2017  Constipation   DVT (deep venous thrombosis) (HCC)   Herniated disc   History of hemorrhoids 12/25/2017  History of pulmonary embolism 03/11/2023  Hypertension   Hypertensive retinopathy, bilateral 07/13/2023  Pulmonary embolism (HCC)   Seasonal allergies   Spinal stenosis   Stomach ulcer   due to NSAID  Tachycardia   TIA (transient ischemic attack) 09/09/2020  Tributary (branch) retinal vein occlusion, right eye, with macular edema 07/13/2023  UTI (lower urinary tract infection)   Vitamin D deficiency 09/14/2017  Weight loss 03/19/2021 Past Surgical History: Past Surgical History: Procedure Laterality Date  COLONOSCOPY  12/21/2011  Procedure: COLONOSCOPY;  Surgeon: [name], MD;  Location: [facility];  Service: Endoscopy;  Laterality: N/A;  DILATION AND CURETTAGE OF UTERUS    ORIF HUMERUS FRACTURE Left 09/12/2017  Procedure: OPEN REDUCTION INTERNAL FIXATION (ORIF) PROXIMAL  HUMERUS  FRACTURE;  Surgeon: Myrene Galas, MD;  Location: [facility] OR;  Service: Orthopedics;  Laterality: Left;  TONSILLECTOMY AND ADENOIDECTOMY   Pat Adams,M.S.,CCC-SLP 07/17/2023, 2:03 PM  DG Knee Complete 4 Views Left Result Date: 07/13/2023 CLINICAL DATA:  Knee pain EXAM: LEFT KNEE - COMPLETE 4+ VIEW COMPARISON:  01/23/2017 FINDINGS: Osseous demineralization. Vascular calcifications. No definitive fracture or malalignment. No significant knee effusion. Mild chondrocalcinosis. IMPRESSION: Osseous demineralization without acute osseous abnormality. Electronically Signed   By: Jasmine Pang M.D.   On: 07/13/2023 15:38   DG Foot Complete Left Result Date: 07/13/2023 CLINICAL DATA:  Foot and knee pain EXAM: LEFT FOOT -  COMPLETE 3+ VIEW COMPARISON:  None Available. FINDINGS: Limited by flexion of the digits and generalized osteopenia. No definitive fracture is seen. There are degenerative changes at the first MTP joint. Vascular calcifications. IMPRESSION: Limited by osteopenia and flexion of the digits. No definitive fracture is seen. Electronically Signed   By: Jasmine Pang M.D.   On: 07/13/2023 15:36   CT Chest Wo Contrast Result Date: 07/13/2023 CLINICAL DATA:  Unwitnessed fall. Patient found on the floor with complaint of left shoulder pain. EXAM: CT CHEST WITHOUT CONTRAST TECHNIQUE: Multidetector CT imaging of the chest was performed following the standard protocol without IV contrast. RADIATION DOSE REDUCTION: This exam was performed according to the departmental dose-optimization program which includes automated exposure control, adjustment of the mA and/or kV according to patient size and/or use of iterative reconstruction technique. COMPARISON:  Same day chest radiograph, CTA chest dated 03/11/2023 FINDINGS: Cardiovascular: Normal heart size. No significant pericardial fluid/thickening. Great vessels are normal in course and caliber. Coronary artery calcifications and aortic atherosclerosis. Mediastinum/Nodes:  Imaged thyroid gland without nodules meeting criteria for imaging follow-up by size. Similar large hiatal hernia. No pathologically enlarged axillary, supraclavicular, mediastinal, or hilar lymph nodes. Lungs/Pleura: The central airways are patent. Unchanged subsegmental atelectasis involving the bilateral lower and right middle lobes. Irregular atelectasis/consolidation within the right lower lobe measures 3.6 x 1.1 cm (5:106), likely increased in size compared to prior examination allowing for motion artifact. Interval resolution of previously noted peripheral right upper lobe part solid nodule. No focal consolidation. No pneumothorax. No pleural effusion. Upper abdomen: Normal. Musculoskeletal: No acute or abnormal lytic or blastic osseous lesions. Postsurgical changes of left proximal humerus fixation. Multilevel degenerative changes of the thoracic spine. Hardware appears intact. IMPRESSION: 1. No acute intrathoracic abnormality. Specifically, no acute traumatic findings. 2. Irregular atelectasis/consolidation within the right lower lobe measures 3.6 x 1.1 cm, likely increased in size compared to prior examination allowing for motion artifact. Recommend follow-up chest CT in 3-6 months to ensure resolution. 3. Interval resolution of previously noted peripheral right upper lobe part solid nodule. 4. Similar large hiatal hernia. 5. Aortic Atherosclerosis (ICD10-I70.0). Coronary artery calcifications. Assessment for potential risk factor modification, dietary therapy or pharmacologic therapy may be warranted, if clinically indicated. Electronically Signed   By: Agustin Cree M.D.   On: 07/13/2023 12:56   DG Shoulder Left Portable Result Date: 07/13/2023 CLINICAL DATA:  fall EXAM: LEFT SHOULDER COMPARISON:  None Available. FINDINGS: Plate and screw fixation of the proximal left humerus. No radiographic findings suggest surgical hardware complication. There is no evidence of fracture or dislocation. Mild to moderate  degenerative changes of the shoulder. Soft tissues are unremarkable. Atherosclerotic plaque. IMPRESSION: No acute displaced fracture or dislocation. Electronically Signed   By: Tish Frederickson M.D.   On: 07/13/2023 12:44   CT Head Wo Contrast Result Date: 07/13/2023 CLINICAL DATA:  Polytrauma, blunt EXAM: CT HEAD WITHOUT CONTRAST CT CERVICAL SPINE WITHOUT CONTRAST TECHNIQUE: Multidetector CT imaging of the head and cervical spine was performed following the standard protocol without intravenous contrast. Multiplanar CT image reconstructions of the cervical spine were also generated. RADIATION DOSE REDUCTION: This exam was performed according to the departmental dose-optimization program which includes automated exposure control, adjustment of the mA and/or kV according to patient size and/or use of iterative reconstruction technique. COMPARISON:  CT cervical spine 09/11/2017, CT head 08/22/2020 FINDINGS: CT HEAD FINDINGS Brain: Patchy and confluent areas of decreased attenuation are noted throughout the deep and periventricular white matter of the cerebral hemispheres bilaterally,  compatible with chronic microvascular ischemic disease. No evidence of large-territorial acute infarction. No parenchymal hemorrhage. No mass lesion. No extra-axial collection. No mass effect or midline shift. No hydrocephalus. Basilar cisterns are patent. Vascular: No hyperdense vessel. Skull: No acute fracture or focal lesion. Sinuses/Orbits: Paranasal sinuses and mastoid air cells are clear. Bilateral lens replacement. Otherwise the orbits are unremarkable. Other: None. CT CERVICAL SPINE FINDINGS Alignment: Normal. Skull base and vertebrae: Multilevel facet arthropathy. No acute fracture. No aggressive appearing focal osseous lesion or focal pathologic process. Soft tissues and spinal canal: No prevertebral fluid or swelling. No visible canal hematoma. Upper chest: Please see separately dictated CT chest 07/13/2023. Other: None.  IMPRESSION: 1. No acute intracranial abnormality. 2. No acute displaced fracture or traumatic listhesis of the cervical spine. Electronically Signed   By: Tish Frederickson M.D.   On: 07/13/2023 12:43   CT Cervical Spine Wo Contrast Result Date: 07/13/2023 CLINICAL DATA:  Polytrauma, blunt EXAM: CT HEAD WITHOUT CONTRAST CT CERVICAL SPINE WITHOUT CONTRAST TECHNIQUE: Multidetector CT imaging of the head and cervical spine was performed following the standard protocol without intravenous contrast. Multiplanar CT image reconstructions of the cervical spine were also generated. RADIATION DOSE REDUCTION: This exam was performed according to the departmental dose-optimization program which includes automated exposure control, adjustment of the mA and/or kV according to patient size and/or use of iterative reconstruction technique. COMPARISON:  CT cervical spine 09/11/2017, CT head 08/22/2020 FINDINGS: CT HEAD FINDINGS Brain: Patchy and confluent areas of decreased attenuation are noted throughout the deep and periventricular white matter of the cerebral hemispheres bilaterally, compatible with chronic microvascular ischemic disease. No evidence of large-territorial acute infarction. No parenchymal hemorrhage. No mass lesion. No extra-axial collection. No mass effect or midline shift. No hydrocephalus. Basilar cisterns are patent. Vascular: No hyperdense vessel. Skull: No acute fracture or focal lesion. Sinuses/Orbits: Paranasal sinuses and mastoid air cells are clear. Bilateral lens replacement. Otherwise the orbits are unremarkable. Other: None. CT CERVICAL SPINE FINDINGS Alignment: Normal. Skull base and vertebrae: Multilevel facet arthropathy. No acute fracture. No aggressive appearing focal osseous lesion or focal pathologic process. Soft tissues and spinal canal: No prevertebral fluid or swelling. No visible canal hematoma. Upper chest: Please see separately dictated CT chest 07/13/2023. Other: None. IMPRESSION: 1.  No acute intracranial abnormality. 2. No acute displaced fracture or traumatic listhesis of the cervical spine. Electronically Signed   By: Tish Frederickson M.D.   On: 07/13/2023 12:43   DG Chest Portable 1 View Result Date: 07/13/2023 CLINICAL DATA:  Fall EXAM: PORTABLE CHEST 1 VIEW COMPARISON:  03/11/2023 FINDINGS: Stable cardiomegaly. Large hiatal hernia. Aortic atherosclerosis. Left basilar atelectasis. Lungs appear otherwise clear. No appreciable pleural fluid collection. No pneumothorax. IMPRESSION: 1. No acute cardiopulmonary findings. 2. Large hiatal hernia. Electronically Signed   By: Duanne Guess D.O.   On: 07/13/2023 11:21    Microbiology: Recent Results (from the past 240 hours)  Resp panel by RT-PCR (RSV, Flu A&B, Covid) Anterior Nasal Swab     Status: Abnormal   Collection Time: 07/13/23 10:25 AM   Specimen: Anterior Nasal Swab  Result Value Ref Range Status   SARS Coronavirus 2 by RT PCR NEGATIVE NEGATIVE Final   Influenza A by PCR POSITIVE (A) NEGATIVE Final   Influenza B by PCR NEGATIVE NEGATIVE Final    Comment: (NOTE) The Xpert Xpress SARS-CoV-2/FLU/RSV plus assay is intended as an aid in the diagnosis of influenza from Nasopharyngeal swab specimens and should not be used as a sole basis for treatment.  Nasal washings and aspirates are unacceptable for Xpert Xpress SARS-CoV-2/FLU/RSV testing.  Fact Sheet for Patients: BloggerCourse.com  Fact Sheet for Healthcare Providers: SeriousBroker.it  This test is not yet approved or cleared by the [place] FDA and has been authorized for detection and/or diagnosis of SARS-CoV-2 by FDA under an Emergency Use Authorization (EUA). This EUA will remain in effect (meaning this test can be used) for the duration of the COVID-19 declaration under Section 564(b)(1) of the Act, 21 U.S.C. section 360bbb-3(b)(1), unless the authorization is terminated or revoked.     Resp  Syncytial Virus by PCR NEGATIVE NEGATIVE Final    Comment: (NOTE) Fact Sheet for Patients: BloggerCourse.com  Fact Sheet for Healthcare Providers: SeriousBroker.it  This test is not yet approved or cleared by the [place] FDA and has been authorized for detection and/or diagnosis of SARS-CoV-2 by FDA under an Emergency Use Authorization (EUA). This EUA will remain in effect (meaning this test can be used) for the duration of the COVID-19 declaration under Section 564(b)(1) of the Act, 21 U.S.C. section 360bbb-3(b)(1), unless the authorization is terminated or revoked.  Performed at [facility], [address] [place], [place] [address]   Respiratory (~20 pathogens) panel by PCR     Status: Abnormal   Collection Time: 07/14/23  2:47 PM   Specimen: Nasopharyngeal Swab; Respiratory  Result Value Ref Range Status   Adenovirus NOT DETECTED NOT DETECTED Final   Coronavirus 229E NOT DETECTED NOT DETECTED Final    Comment: (NOTE) The Coronavirus on the Respiratory Panel, DOES NOT test for the novel  Coronavirus (2019 nCoV)    Coronavirus HKU1 NOT DETECTED NOT DETECTED Final   Coronavirus NL63 NOT DETECTED NOT DETECTED Final   Coronavirus OC43 NOT DETECTED NOT DETECTED Final   Metapneumovirus NOT DETECTED NOT DETECTED Final   Rhinovirus / Enterovirus NOT DETECTED NOT DETECTED Final   Influenza A H1 2009 DETECTED (A) NOT DETECTED Final   Influenza B NOT DETECTED NOT DETECTED Final   Parainfluenza Virus 1 NOT DETECTED NOT DETECTED Final   Parainfluenza Virus 2 NOT DETECTED NOT DETECTED Final   Parainfluenza Virus 3 NOT DETECTED NOT DETECTED Final   Parainfluenza Virus 4 NOT DETECTED NOT DETECTED Final   Respiratory Syncytial Virus NOT DETECTED NOT DETECTED Final   Bordetella pertussis NOT DETECTED NOT DETECTED Final   Bordetella Parapertussis NOT DETECTED NOT DETECTED Final   Chlamydophila pneumoniae NOT DETECTED NOT  DETECTED Final   Mycoplasma pneumoniae NOT DETECTED NOT DETECTED Final    Comment: Performed at [facility], [address], [place], [place] [address]     Labs: Basic Metabolic Panel: Recent Labs  Lab 07/16/23 0335 07/17/23 0436 07/19/23 0349 07/20/23 0333 07/21/23 0416  NA 139 142 145 148* 147*  K 3.9 3.8 3.4* 5.1 3.8  CL 101 106 110 113* 111  CO2 23 26 26 25  17*  GLUCOSE 106* 122* 101* 90 119*  BUN 17 17 15 16  31*  CREATININE 1.23* 1.19* 0.73 0.83 1.29*  CALCIUM 9.4 9.2 9.2 9.7 10.2  MG  --   --  2.1 2.2 2.3  PHOS  --   --  2.2*  --   --    Liver Function Tests: No results for input(s): "AST", "ALT", "ALKPHOS", "BILITOT", "PROT", "ALBUMIN" in the last 168 hours. No results for input(s): "LIPASE", "AMYLASE" in the last 168 hours. No results for input(s): "AMMONIA" in the last 168 hours. CBC: Recent Labs  Lab 07/16/23 0335 07/17/23 0436  07/19/23 0349  WBC 7.0 8.5 5.1  NEUTROABS 5.3 6.9  --   HGB 15.4* 15.0 14.5  HCT 44.1 43.5 43.9  MCV 82.9 83.0 86.4  PLT 167 179 224   Cardiac Enzymes: No results for input(s): "CKTOTAL", "CKMB", "CKMBINDEX", "TROPONINI" in the last 168 hours. D-Dimer No results for input(s): "DDIMER" in the last 72 hours. BNP: Invalid input(s): "POCBNP" CBG: Recent Labs  Lab 07/20/23 1703 07/20/23 2054 07/21/23 0632 07/21/23 0841 07/21/23 1557  GLUCAP 110* 113* 99 151* 113*   Anemia work up No results for input(s): "VITAMINB12", "FOLATE", "FERRITIN", "TIBC", "IRON", "RETICCTPCT" in the last 72 hours. Urinalysis    Component Value Date/Time   COLORURINE YELLOW 03/11/2023 1447   APPEARANCEUR HAZY (A) 03/11/2023 1447   LABSPEC 1.010 03/11/2023 1447   PHURINE 7.0 03/11/2023 1447   GLUCOSEU NEGATIVE 03/11/2023 1447   HGBUR NEGATIVE 03/11/2023 1447   BILIRUBINUR NEGATIVE 03/11/2023 1447   BILIRUBINUR negative 03/06/2023 1229   KETONESUR NEGATIVE 03/11/2023 1447   PROTEINUR NEGATIVE 03/11/2023 1447   UROBILINOGEN 0.2  03/06/2023 1229   UROBILINOGEN 0.2 12/02/2014 1543   NITRITE NEGATIVE 03/11/2023 1447   LEUKOCYTESUR LARGE (A) 03/11/2023 1447   Sepsis Labs Recent Labs  Lab 07/16/23 0335 07/17/23 0436 07/19/23 0349  WBC 7.0 8.5 5.1       SIGNED:  Glade Lloyd, MD  [facility] 07/19/2023, 11:29 AM

## 2023-07-29 NOTE — Progress Notes (Signed)
 Brief nutrition note: TF adjustment.  RN reached out to RD, stated that night RN when pt advanced to goal rate she had emesis, and emesis this morning. Patient possibly not tolerating goal rate will decrease to 1ml/hr as this was the last rate she tolerated.    Decrease tube feeding via cortrak: Osmolite 1.2  at 40 ml/h (960 ml per day)  Prosource TF20 60 ml BID  Provides 1312 kcal, 93 gm protein, 825 ml free water daily w/o FWF.  Becky Leach RDN, LDN Clinical Dietitian   If unable to reach, please contact "RD Inpatient" secure chat group between 8 am-4 pm daily"

## 2023-07-29 NOTE — Progress Notes (Addendum)
 SLP Cancellation Note  Patient Details Name: Becky Leach MRN: [id number] DOB: 11/25/1929   Cancelled treatment:       Reason Eval/Treat Not Completed: Medical issues which prohibited therapy. Pt had bout of emesis in transit to radiology and is not currently appropriate to participate in MBS. RN notified. SLP will continue following to repeat MBS as able.    Gwynneth Aliment, M.A., CF-SLP Speech Language Pathology, Acute Rehabilitation Services  Secure Chat preferred [phone]  07/21/2023, 2:41 PM

## 2023-07-29 NOTE — Progress Notes (Signed)
 Time of death 2300. 2nd nurse verified Kia RN. MD paged and made aware. Sons at bedside.

## 2023-07-29 NOTE — Progress Notes (Signed)
   07/21/23 2033  Vitals  BP 112/66  MAP (mmHg) 81  BP Location Right Arm  BP Method Automatic  Patient Position (if appropriate) Lying  Pulse Rate (!) 152  Pulse Rate Source Dinamap  MEWS COLOR  MEWS Score Color Yellow  Oxygen Therapy  SpO2 92 %  O2 Device Non-rebreather Mask  MEWS Score  MEWS Temp 0  MEWS Systolic 0  MEWS Pulse 3  MEWS RR 0  MEWS LOC 0  MEWS Score 3   Vitals above s/p 1L nsaline bolus  -pt resting in bed at this time minimally responsive to voice  -family at beside  -pending transfer to [facility]

## 2023-07-29 NOTE — Progress Notes (Signed)
 PROGRESS NOTE    Becky Leach  WUJ:[id number] DOB: Mar 10, 1930 DOA: 07/13/2023 PCP: Erick Alley, DO   Brief Narrative:  89 y.o. female with medical history significant of spinal stenosis, unprovoked PE/DVT, hematuria (taken off of AC), arthritis, constipation, herniated disc, hypertension, stomach ulcer, vitamin D deficiency, UTI was found to be lying on the floor by her family member. She was noted to have influenza. She was hospitalized for further management.  Hospitalized complicated by poor oral intake and severe dysphagia.  Palliative care consulted.  Assessment & Plan:   Acute influenza  Acute respiratory failure with hypoxia -treated with Tamiflu.  Respiratory status is stable.   -Still on 2-4 L oxygen by nasal cannula intermittently.  Wean off as able.   Oropharyngeal dysphagia -Underwent swallow evaluation on 2/17 with MBS which suggested severe dysphagia and high aspiration risk.  N.p.o. status was recommended by SLP. Conversations held by prior hospitalist with patient and her son.  She does not have any focal neurological deficits.  CT head done recently did not show any acute findings.  Her dysphagia could just be secondary to profound weakness from acute illness.  GI evaluation appreciated: GI signed off on 07/19/2023.  Family still deciding best course of action. -Patient's son wants to try tube feedings to see if there is any improvement in the next few days.  cortrak placed on 07/19/2023 and feeding started. Swallow function to be reassessed in a few days.   -Palliative care following.   Frequent falls -Patient with history of frequent falls.  -PT and OT recommending SNF.  TOC is following.   History of pulmonary embolus (PE) The patient was placed on eliquis for this. She was subsequently taken off of it due to hematuria, but was on it again at the time of presentation. Continue with apixaban for now.  No evidence of overt bleeding.  Hemoglobin is stable.    Rhabdomyolysis Treated with IV fluids with improvement in CK levels.     Acute renal failure superimposed on stage 3b chronic kidney disease (HCC) Renal function has improved.    Hypokalemia -Improved  Hypernatremia -Possibly from dehydration.  Continue IV fluids.  Continue free water via tube.  Acute metabolic acidosis -Mild.  Monitor.  Hypertension -DC amlodipine.  Blood pressure intermittently on the lower side.   DVT Prophylaxis: On apixaban Code Status: DNR Family Communication: None at bedside.  Disposition Plan: Status is: Inpatient Remains inpatient appropriate because: Of severity of illness  Consultants: Palliative care/GI.  Procedures: None  Antimicrobials: None   Subjective: Patient seen and examined at bedside.  No agitation, fever, vomiting or seizures reported.   Objective: Vitals:   07/20/23 2100 07/21/23 0045 07/21/23 0108 07/21/23 0430  BP: (!) 127/91  105/73 104/80  Pulse: 95  98 98  Resp: 18  16 17   Temp:   99 F (37.2 C) 98.3 F (36.8 C)  TempSrc:    Oral  SpO2: 93% 96% 92%   Weight:      Height:        Intake/Output Summary (Last 24 hours) at 07/21/2023 0707 Last data filed at 07/21/2023 0349 Gross per 24 hour  Intake 1053.89 ml  Output --  Net 1053.89 ml    Filed Weights   07/13/23 1005 07/19/23 0500 07/20/23 0500  Weight: 47.2 kg 58.2 kg 58 kg    Examination:  General: No acute distress.  On 4 L oxygen via nasal cannula.  Chronically ill and deconditioned looking.   ENT/neck: No obvious  neck masses or JVD elevation noted.   Cortrak tube is in place respiratory: Bilateral decreased breath sounds at bases with scattered crackles CVS: Rate mostly controlled; S1 and S2 are heard Abdominal: Soft, nontender, distended slightly; no organomegaly,  bowel sounds are heard normally Extremities: No clubbing; mild lower extremity edema present CNS: Very slow to respond.  Extremity poor historian.  No focal neurologic deficit.  Able to  move extremities Lymph: No obvious palpable lymphadenopathy Skin: No obvious petechiae/rashes psych: Not agitated.  Flat affect mostly. Musculoskeletal: No obvious joint swelling/deformity     Data Reviewed: I have personally reviewed following labs and imaging studies  CBC: Recent Labs  Lab 07/15/23 0446 07/16/23 0335 07/17/23 0436 07/19/23 0349  WBC 6.2 7.0 8.5 5.1  NEUTROABS 4.5 5.3 6.9  --   HGB 14.3 15.4* 15.0 14.5  HCT 42.0 44.1 43.5 43.9  MCV 83.2 82.9 83.0 86.4  PLT 184 167 179 224   Basic Metabolic Panel: Recent Labs  Lab 07/16/23 0335 07/17/23 0436 07/19/23 0349 07/20/23 0333 07/21/23 0416  NA 139 142 145 148* 147*  K 3.9 3.8 3.4* 5.1 3.8  CL 101 106 110 113* 111  CO2 23 26 26 25  17*  GLUCOSE 106* 122* 101* 90 119*  BUN 17 17 15 16  31*  CREATININE 1.23* 1.19* 0.73 0.83 1.29*  CALCIUM 9.4 9.2 9.2 9.7 10.2  MG  --   --  2.1 2.2 2.3  PHOS  --   --  2.2*  --   --    GFR: Estimated Creatinine Clearance: 25 mL/min (A) (by C-G formula based on SCr of 1.29 mg/dL (H)). Liver Function Tests: No results for input(s): "AST", "ALT", "ALKPHOS", "BILITOT", "PROT", "ALBUMIN" in the last 168 hours.  No results for input(s): "LIPASE", "AMYLASE" in the last 168 hours. No results for input(s): "AMMONIA" in the last 168 hours. Coagulation Profile: No results for input(s): "INR", "PROTIME" in the last 168 hours. Cardiac Enzymes: Recent Labs  Lab 07/15/23 0446  CKTOTAL 536*   BNP (last 3 results) No results for input(s): "PROBNP" in the last 8760 hours. HbA1C: No results for input(s): "HGBA1C" in the last 72 hours. CBG: Recent Labs  Lab 07/20/23 0801 07/20/23 1204 07/20/23 1703 07/20/23 2054 07/21/23 0632  GLUCAP 85 82 110* 113* 99   Lipid Profile: No results for input(s): "CHOL", "HDL", "LDLCALC", "TRIG", "CHOLHDL", "LDLDIRECT" in the last 72 hours. Thyroid Function Tests: No results for input(s): "TSH", "T4TOTAL", "FREET4", "T3FREE", "THYROIDAB" in the  last 72 hours. Anemia Panel: No results for input(s): "VITAMINB12", "FOLATE", "FERRITIN", "TIBC", "IRON", "RETICCTPCT" in the last 72 hours. Sepsis Labs: No results for input(s): "PROCALCITON", "LATICACIDVEN" in the last 168 hours.  Recent Results (from the past 240 hours)  Resp panel by RT-PCR (RSV, Flu A&B, Covid) Anterior Nasal Swab     Status: Abnormal   Collection Time: 07/13/23 10:25 AM   Specimen: Anterior Nasal Swab  Result Value Ref Range Status   SARS Coronavirus 2 by RT PCR NEGATIVE NEGATIVE Final   Influenza A by PCR POSITIVE (A) NEGATIVE Final   Influenza B by PCR NEGATIVE NEGATIVE Final    Comment: (NOTE) The Xpert Xpress SARS-CoV-2/FLU/RSV plus assay is intended as an aid in the diagnosis of influenza from Nasopharyngeal swab specimens and should not be used as a sole basis for treatment. Nasal washings and aspirates are unacceptable for Xpert Xpress SARS-CoV-2/FLU/RSV testing.  Fact Sheet for Patients: BloggerCourse.com  Fact Sheet for Healthcare Providers: SeriousBroker.it  This test is  not yet approved or cleared by the [place] and has been authorized for detection and/or diagnosis of SARS-CoV-2 by FDA under an Emergency Use Authorization (EUA). This EUA will remain in effect (meaning this test can be used) for the duration of the COVID-19 declaration under Section 564(b)(1) of the Act, 21 U.S.C. section 360bbb-3(b)(1), unless the authorization is terminated or revoked.     Resp Syncytial Virus by PCR NEGATIVE NEGATIVE Final    Comment: (NOTE) Fact Sheet for Patients: BloggerCourse.com  Fact Sheet for Healthcare Providers: SeriousBroker.it  This test is not yet approved or cleared by the [place] FDA and has been authorized for detection and/or diagnosis of SARS-CoV-2 by FDA under an Emergency Use Authorization (EUA). This EUA will remain in  effect (meaning this test can be used) for the duration of the COVID-19 declaration under Section 564(b)(1) of the Act, 21 U.S.C. section 360bbb-3(b)(1), unless the authorization is terminated or revoked.  Performed at [facility], [address] [place], [place] [address]   Respiratory (~20 pathogens) panel by PCR     Status: Abnormal   Collection Time: 07/14/23  2:47 PM   Specimen: Nasopharyngeal Swab; Respiratory  Result Value Ref Range Status   Adenovirus NOT DETECTED NOT DETECTED Final   Coronavirus 229E NOT DETECTED NOT DETECTED Final    Comment: (NOTE) The Coronavirus on the Respiratory Panel, DOES NOT test for the novel  Coronavirus (2019 nCoV)    Coronavirus HKU1 NOT DETECTED NOT DETECTED Final   Coronavirus NL63 NOT DETECTED NOT DETECTED Final   Coronavirus OC43 NOT DETECTED NOT DETECTED Final   Metapneumovirus NOT DETECTED NOT DETECTED Final   Rhinovirus / Enterovirus NOT DETECTED NOT DETECTED Final   Influenza A H1 2009 DETECTED (A) NOT DETECTED Final   Influenza B NOT DETECTED NOT DETECTED Final   Parainfluenza Virus 1 NOT DETECTED NOT DETECTED Final   Parainfluenza Virus 2 NOT DETECTED NOT DETECTED Final   Parainfluenza Virus 3 NOT DETECTED NOT DETECTED Final   Parainfluenza Virus 4 NOT DETECTED NOT DETECTED Final   Respiratory Syncytial Virus NOT DETECTED NOT DETECTED Final   Bordetella pertussis NOT DETECTED NOT DETECTED Final   Bordetella Parapertussis NOT DETECTED NOT DETECTED Final   Chlamydophila pneumoniae NOT DETECTED NOT DETECTED Final   Mycoplasma pneumoniae NOT DETECTED NOT DETECTED Final    Comment: Performed at [facility], [address] [place], [place] [address]         Radiology Studies: DG Abd Portable 1V Result Date: 07/19/2023 CLINICAL DATA:  Enteric catheter placement EXAM: PORTABLE ABDOMEN - 1 VIEW COMPARISON:  07/13/2023 FINDINGS: Frontal view of the lower chest and upper abdomen demonstrates enteric catheter projecting  over the known large hiatal hernia containing the majority of the stomach, as seen on previous CT. Persistent consolidation at the lung bases, right greater than left. Oral contrast seen within the colon. IMPRESSION: 1. Enteric catheter, tip projecting over the large hiatal hernia containing the majority of the stomach as seen on recent CT. Electronically Signed   By: Sharlet Salina M.D.   On: 07/19/2023 09:09        Scheduled Meds:  amLODipine  2.5 mg Per Tube Daily   apixaban  5 mg Per Tube BID   feeding supplement (PROSource TF20)  60 mL Per Tube Daily   free water  150 mL Per Tube Q6H   pantoprazole (PROTONIX) IV  40 mg Intravenous Daily   thiamine  100 mg Per Tube Daily  Continuous Infusions:  dextrose 40 mL/hr at 07/21/23 0155   feeding supplement (OSMOLITE 1.2 CAL) 40 mL/hr at 07/21/23 0349          Glade Lloyd, MD [facility] 07/21/2023, 7:07 AM

## 2023-07-29 NOTE — Progress Notes (Signed)
 Called by nursing patient responsive to painful stimuli.  Hypotensive, hypoxic and bradycardic. 56/33, 44, 50% on room air, 1 L bolus, on face mask, slow recovery status 93% blood pressure improved to 108/56, 145.  CXR concerning for aspiration pneumonia.  Family wanted patient to be upgraded.  IV Rocephin and Flagyl started.  Nonrebreather continued.  Lungs were clear. ABG not ordered as patient is DNR/DNI.  Not a candidate for BiPAP given her mentation. Data called patient blood pressure 46/33.  Patient passed at 48 PM.  Family was at bedside.

## 2023-07-29 DEATH — deceased
